# Patient Record
Sex: Female | Born: 1955
Health system: Western US, Academic
[De-identification: ages and names within clinical notes are randomized; demographics above are authoritative.]

## PROBLEM LIST (undated history)

## (undated) DIAGNOSIS — Z85850 Personal history of malignant neoplasm of thyroid: Secondary | ICD-10-CM

## (undated) DIAGNOSIS — R112 Nausea with vomiting, unspecified: Secondary | ICD-10-CM

## (undated) DIAGNOSIS — E785 Hyperlipidemia, unspecified: Secondary | ICD-10-CM

## (undated) DIAGNOSIS — R739 Hyperglycemia, unspecified: Secondary | ICD-10-CM

## (undated) DIAGNOSIS — M810 Age-related osteoporosis without current pathological fracture: Secondary | ICD-10-CM

## (undated) DIAGNOSIS — E890 Postprocedural hypothyroidism: Secondary | ICD-10-CM

## (undated) DIAGNOSIS — F32A Depression, unspecified: Secondary | ICD-10-CM

## (undated) DIAGNOSIS — T7840XA Allergy, unspecified, initial encounter: Secondary | ICD-10-CM

## (undated) DIAGNOSIS — K209 Esophagitis, unspecified without bleeding: Secondary | ICD-10-CM

## (undated) DIAGNOSIS — K648 Other hemorrhoids: Secondary | ICD-10-CM

## (undated) DIAGNOSIS — M26609 Unspecified temporomandibular joint disorder, unspecified side: Secondary | ICD-10-CM

## (undated) DIAGNOSIS — M47816 Spondylosis without myelopathy or radiculopathy, lumbar region: Secondary | ICD-10-CM

## (undated) DIAGNOSIS — Z8742 Personal history of other diseases of the female genital tract: Secondary | ICD-10-CM

## (undated) DIAGNOSIS — K2270 Barrett's esophagus without dysplasia: Secondary | ICD-10-CM

## (undated) DIAGNOSIS — F329 Major depressive disorder, single episode, unspecified: Secondary | ICD-10-CM

## (undated) DIAGNOSIS — K219 Gastro-esophageal reflux disease without esophagitis: Secondary | ICD-10-CM

## (undated) DIAGNOSIS — E663 Overweight: Principal | ICD-10-CM

## (undated) DIAGNOSIS — F411 Generalized anxiety disorder: Secondary | ICD-10-CM

## (undated) DIAGNOSIS — Z8719 Personal history of other diseases of the digestive system: Secondary | ICD-10-CM

## (undated) DIAGNOSIS — C801 Malignant (primary) neoplasm, unspecified: Secondary | ICD-10-CM

## (undated) DIAGNOSIS — Z9889 Other specified postprocedural states: Secondary | ICD-10-CM

## (undated) DIAGNOSIS — J069 Acute upper respiratory infection, unspecified: Secondary | ICD-10-CM

## (undated) DIAGNOSIS — J309 Allergic rhinitis, unspecified: Secondary | ICD-10-CM

## (undated) DIAGNOSIS — D131 Benign neoplasm of stomach: Secondary | ICD-10-CM

## (undated) DIAGNOSIS — R002 Palpitations: Secondary | ICD-10-CM

## (undated) DIAGNOSIS — K649 Unspecified hemorrhoids: Secondary | ICD-10-CM

## (undated) HISTORY — DX: Hyperlipidemia, unspecified: E78.5

## (undated) HISTORY — DX: Major depressive disorder, single episode, unspecified: F32.9

## (undated) HISTORY — DX: Age-related osteoporosis without current pathological fracture: M81.0

## (undated) HISTORY — DX: Acute upper respiratory infection, unspecified: J06.9

## (undated) HISTORY — DX: Generalized anxiety disorder: F41.1

## (undated) HISTORY — DX: Other hemorrhoids: K64.8

## (undated) HISTORY — DX: Benign neoplasm of stomach: D13.1

## (undated) HISTORY — DX: Gastro-esophageal reflux disease without esophagitis: K21.9

## (undated) HISTORY — PX: HEMORRHOID SURGERY: SHX153

## (undated) HISTORY — DX: Palpitations: R00.2

## (undated) HISTORY — DX: Overweight: E66.3

## (undated) HISTORY — DX: Malignant (primary) neoplasm, unspecified: C80.1

## (undated) HISTORY — PX: UPPER GASTROINTESTINAL ENDOSCOPY: SHX188

## (undated) HISTORY — DX: Hyperglycemia, unspecified: R73.9

## (undated) HISTORY — DX: Depression, unspecified: F32.A

## (undated) HISTORY — DX: Allergy, unspecified, initial encounter: T78.40XA

## (undated) HISTORY — DX: Barrett's esophagus without dysplasia: K22.70

---

## 1994-04-27 ENCOUNTER — Encounter: Payer: Self-pay | Admitting: Gastroenterology

## 1994-09-28 HISTORY — PX: OTHER SURGICAL HISTORY: SHX169

## 1998-03-01 ENCOUNTER — Encounter: Payer: Self-pay | Admitting: Gastroenterology

## 1998-06-09 ENCOUNTER — Emergency Department (HOSPITAL_COMMUNITY): Admission: EM | Admit: 1998-06-09 | Discharge: 1998-06-09 | Payer: Self-pay | Admitting: Emergency Medicine

## 1998-08-27 ENCOUNTER — Encounter: Payer: Self-pay | Admitting: Surgery

## 1998-08-27 ENCOUNTER — Ambulatory Visit (HOSPITAL_COMMUNITY): Admission: RE | Admit: 1998-08-27 | Discharge: 1998-08-27 | Payer: Self-pay | Admitting: Surgery

## 2000-02-16 ENCOUNTER — Encounter: Payer: Self-pay | Admitting: Endocrinology

## 2000-02-16 ENCOUNTER — Encounter: Admission: RE | Admit: 2000-02-16 | Discharge: 2000-02-16 | Payer: Self-pay | Admitting: Endocrinology

## 2000-02-20 ENCOUNTER — Ambulatory Visit (HOSPITAL_COMMUNITY): Admission: RE | Admit: 2000-02-20 | Discharge: 2000-02-20 | Payer: Self-pay | Admitting: Obstetrics and Gynecology

## 2000-02-20 ENCOUNTER — Encounter: Payer: Self-pay | Admitting: Obstetrics and Gynecology

## 2000-03-09 ENCOUNTER — Ambulatory Visit (HOSPITAL_COMMUNITY): Admission: RE | Admit: 2000-03-09 | Discharge: 2000-03-09 | Payer: Self-pay | Admitting: Gastroenterology

## 2000-04-28 ENCOUNTER — Encounter: Payer: Self-pay | Admitting: Gastroenterology

## 2000-04-28 ENCOUNTER — Ambulatory Visit (HOSPITAL_COMMUNITY): Admission: RE | Admit: 2000-04-28 | Discharge: 2000-04-28 | Payer: Self-pay | Admitting: Gastroenterology

## 2000-05-04 ENCOUNTER — Ambulatory Visit (HOSPITAL_COMMUNITY): Admission: RE | Admit: 2000-05-04 | Discharge: 2000-05-04 | Payer: Self-pay | Admitting: Gastroenterology

## 2000-05-04 ENCOUNTER — Encounter: Payer: Self-pay | Admitting: Gastroenterology

## 2000-05-18 ENCOUNTER — Encounter: Payer: Self-pay | Admitting: Endocrinology

## 2000-05-18 ENCOUNTER — Encounter: Admission: RE | Admit: 2000-05-18 | Discharge: 2000-05-18 | Payer: Self-pay | Admitting: Endocrinology

## 2001-06-28 ENCOUNTER — Encounter: Admission: RE | Admit: 2001-06-28 | Discharge: 2001-06-28 | Payer: Self-pay | Admitting: Obstetrics and Gynecology

## 2001-06-28 ENCOUNTER — Encounter: Payer: Self-pay | Admitting: Obstetrics and Gynecology

## 2001-07-01 ENCOUNTER — Encounter: Admission: RE | Admit: 2001-07-01 | Discharge: 2001-07-01 | Payer: Self-pay | Admitting: Obstetrics and Gynecology

## 2001-07-01 ENCOUNTER — Encounter: Payer: Self-pay | Admitting: Obstetrics and Gynecology

## 2002-07-21 ENCOUNTER — Ambulatory Visit (HOSPITAL_COMMUNITY): Admission: RE | Admit: 2002-07-21 | Discharge: 2002-07-21 | Payer: Self-pay | Admitting: Obstetrics and Gynecology

## 2002-07-21 ENCOUNTER — Encounter (INDEPENDENT_AMBULATORY_CARE_PROVIDER_SITE_OTHER): Payer: Self-pay

## 2002-07-21 HISTORY — PX: LAPAROSCOPIC TUBAL LIGATION: SUR803

## 2002-11-06 ENCOUNTER — Encounter: Admission: RE | Admit: 2002-11-06 | Discharge: 2002-11-06 | Payer: Self-pay | Admitting: Obstetrics and Gynecology

## 2002-11-06 ENCOUNTER — Encounter: Payer: Self-pay | Admitting: Obstetrics and Gynecology

## 2002-12-26 ENCOUNTER — Other Ambulatory Visit: Admission: RE | Admit: 2002-12-26 | Discharge: 2002-12-26 | Payer: Self-pay | Admitting: Obstetrics and Gynecology

## 2003-11-22 ENCOUNTER — Encounter: Admission: RE | Admit: 2003-11-22 | Discharge: 2003-11-22 | Payer: Self-pay | Admitting: Obstetrics and Gynecology

## 2004-02-04 ENCOUNTER — Other Ambulatory Visit: Admission: RE | Admit: 2004-02-04 | Discharge: 2004-02-04 | Payer: Self-pay | Admitting: Obstetrics and Gynecology

## 2004-12-17 ENCOUNTER — Encounter: Admission: RE | Admit: 2004-12-17 | Discharge: 2004-12-17 | Payer: Self-pay | Admitting: Obstetrics and Gynecology

## 2004-12-19 ENCOUNTER — Ambulatory Visit: Payer: Self-pay | Admitting: Gastroenterology

## 2005-01-07 ENCOUNTER — Observation Stay (HOSPITAL_COMMUNITY): Admission: RE | Admit: 2005-01-07 | Discharge: 2005-01-08 | Payer: Self-pay | Admitting: Urology

## 2005-01-07 HISTORY — PX: TRANSOBTURATOR SLING: SHX2571

## 2005-02-18 ENCOUNTER — Other Ambulatory Visit: Admission: RE | Admit: 2005-02-18 | Discharge: 2005-02-18 | Payer: Self-pay | Admitting: Obstetrics and Gynecology

## 2005-05-14 ENCOUNTER — Ambulatory Visit (HOSPITAL_COMMUNITY): Admission: RE | Admit: 2005-05-14 | Discharge: 2005-05-14 | Payer: Self-pay | Admitting: Endocrinology

## 2005-09-25 ENCOUNTER — Ambulatory Visit (HOSPITAL_COMMUNITY): Admission: RE | Admit: 2005-09-25 | Discharge: 2005-09-25 | Payer: Self-pay | Admitting: Neurology

## 2005-11-09 ENCOUNTER — Ambulatory Visit: Payer: Self-pay | Admitting: Gastroenterology

## 2006-02-09 ENCOUNTER — Encounter: Admission: RE | Admit: 2006-02-09 | Discharge: 2006-02-09 | Payer: Self-pay | Admitting: Obstetrics and Gynecology

## 2007-02-28 ENCOUNTER — Encounter: Admission: RE | Admit: 2007-02-28 | Discharge: 2007-02-28 | Payer: Self-pay | Admitting: Obstetrics and Gynecology

## 2007-03-03 ENCOUNTER — Encounter: Admission: RE | Admit: 2007-03-03 | Discharge: 2007-03-03 | Payer: Self-pay | Admitting: Obstetrics and Gynecology

## 2007-07-01 ENCOUNTER — Encounter: Admission: RE | Admit: 2007-07-01 | Discharge: 2007-07-01 | Payer: Self-pay | Admitting: Endocrinology

## 2007-07-21 ENCOUNTER — Ambulatory Visit: Payer: Self-pay | Admitting: Gastroenterology

## 2007-08-08 ENCOUNTER — Ambulatory Visit: Payer: Self-pay | Admitting: Gastroenterology

## 2007-08-08 ENCOUNTER — Encounter: Payer: Self-pay | Admitting: Gastroenterology

## 2007-08-08 HISTORY — PX: COLONOSCOPY: SHX174

## 2007-10-03 ENCOUNTER — Ambulatory Visit: Payer: Self-pay | Admitting: Gastroenterology

## 2007-11-24 DIAGNOSIS — E782 Mixed hyperlipidemia: Secondary | ICD-10-CM | POA: Insufficient documentation

## 2007-11-24 DIAGNOSIS — K219 Gastro-esophageal reflux disease without esophagitis: Secondary | ICD-10-CM | POA: Insufficient documentation

## 2007-11-24 DIAGNOSIS — K2270 Barrett's esophagus without dysplasia: Secondary | ICD-10-CM | POA: Insufficient documentation

## 2007-11-24 DIAGNOSIS — K649 Unspecified hemorrhoids: Secondary | ICD-10-CM | POA: Insufficient documentation

## 2007-11-24 DIAGNOSIS — K449 Diaphragmatic hernia without obstruction or gangrene: Secondary | ICD-10-CM | POA: Insufficient documentation

## 2007-11-24 DIAGNOSIS — E039 Hypothyroidism, unspecified: Secondary | ICD-10-CM | POA: Insufficient documentation

## 2008-01-30 ENCOUNTER — Telehealth: Payer: Self-pay | Admitting: Gastroenterology

## 2008-01-30 ENCOUNTER — Encounter (INDEPENDENT_AMBULATORY_CARE_PROVIDER_SITE_OTHER): Payer: Self-pay | Admitting: *Deleted

## 2008-01-30 ENCOUNTER — Ambulatory Visit (HOSPITAL_COMMUNITY): Admission: RE | Admit: 2008-01-30 | Discharge: 2008-01-31 | Payer: Self-pay | Admitting: *Deleted

## 2008-01-30 HISTORY — PX: APPENDECTOMY: SHX54

## 2008-02-29 ENCOUNTER — Encounter: Admission: RE | Admit: 2008-02-29 | Discharge: 2008-02-29 | Payer: Self-pay | Admitting: Obstetrics and Gynecology

## 2008-03-14 ENCOUNTER — Telehealth: Payer: Self-pay | Admitting: Gastroenterology

## 2008-03-15 ENCOUNTER — Encounter: Payer: Self-pay | Admitting: Gastroenterology

## 2008-04-02 ENCOUNTER — Telehealth: Payer: Self-pay | Admitting: Gastroenterology

## 2008-04-17 DIAGNOSIS — Z860100 Personal history of colon polyps, unspecified: Secondary | ICD-10-CM | POA: Insufficient documentation

## 2008-04-18 ENCOUNTER — Ambulatory Visit: Payer: Self-pay | Admitting: Gastroenterology

## 2008-11-22 ENCOUNTER — Encounter: Payer: Self-pay | Admitting: Family Medicine

## 2008-12-20 ENCOUNTER — Ambulatory Visit: Payer: Self-pay | Admitting: Family Medicine

## 2008-12-20 DIAGNOSIS — J309 Allergic rhinitis, unspecified: Secondary | ICD-10-CM | POA: Insufficient documentation

## 2009-03-27 ENCOUNTER — Encounter: Admission: RE | Admit: 2009-03-27 | Discharge: 2009-03-27 | Payer: Self-pay | Admitting: Obstetrics and Gynecology

## 2009-05-22 ENCOUNTER — Ambulatory Visit: Payer: Self-pay | Admitting: Gastroenterology

## 2009-08-14 ENCOUNTER — Ambulatory Visit: Payer: Self-pay | Admitting: Family Medicine

## 2009-08-14 DIAGNOSIS — E663 Overweight: Secondary | ICD-10-CM

## 2009-08-14 DIAGNOSIS — J00 Acute nasopharyngitis [common cold]: Secondary | ICD-10-CM | POA: Insufficient documentation

## 2009-08-14 DIAGNOSIS — H6690 Otitis media, unspecified, unspecified ear: Secondary | ICD-10-CM | POA: Insufficient documentation

## 2009-08-14 HISTORY — DX: Overweight: E66.3

## 2009-09-30 ENCOUNTER — Telehealth: Payer: Self-pay | Admitting: Gastroenterology

## 2010-04-09 ENCOUNTER — Encounter: Admission: RE | Admit: 2010-04-09 | Discharge: 2010-04-09 | Payer: Self-pay | Admitting: Obstetrics and Gynecology

## 2010-06-10 ENCOUNTER — Encounter (INDEPENDENT_AMBULATORY_CARE_PROVIDER_SITE_OTHER): Payer: Self-pay | Admitting: *Deleted

## 2010-07-14 ENCOUNTER — Encounter (INDEPENDENT_AMBULATORY_CARE_PROVIDER_SITE_OTHER): Payer: Self-pay | Admitting: *Deleted

## 2010-07-30 ENCOUNTER — Ambulatory Visit: Payer: Self-pay | Admitting: Family Medicine

## 2010-07-30 DIAGNOSIS — H8110 Benign paroxysmal vertigo, unspecified ear: Secondary | ICD-10-CM | POA: Insufficient documentation

## 2010-09-30 ENCOUNTER — Telehealth (INDEPENDENT_AMBULATORY_CARE_PROVIDER_SITE_OTHER): Payer: Self-pay | Admitting: *Deleted

## 2010-10-10 ENCOUNTER — Telehealth: Payer: Self-pay | Admitting: Family Medicine

## 2010-10-14 ENCOUNTER — Ambulatory Visit
Admission: RE | Admit: 2010-10-14 | Discharge: 2010-10-14 | Payer: Self-pay | Source: Home / Self Care | Attending: Family Medicine | Admitting: Family Medicine

## 2010-10-14 ENCOUNTER — Encounter: Payer: Self-pay | Admitting: Family Medicine

## 2010-10-14 DIAGNOSIS — F430 Acute stress reaction: Secondary | ICD-10-CM | POA: Insufficient documentation

## 2010-10-14 DIAGNOSIS — R002 Palpitations: Secondary | ICD-10-CM | POA: Insufficient documentation

## 2010-10-14 DIAGNOSIS — R55 Syncope and collapse: Secondary | ICD-10-CM | POA: Insufficient documentation

## 2010-10-19 ENCOUNTER — Encounter: Payer: Self-pay | Admitting: Obstetrics and Gynecology

## 2010-10-30 NOTE — Procedures (Signed)
Summary: gastroenterology colon   gastroenterology colon   Imported By: Donneta Romberg 11/25/2007 10:46:37  _____________________________________________________________________  External Attachment:    Type:   Image     Comment:   External Document

## 2010-10-30 NOTE — Assessment & Plan Note (Signed)
Summary: f.u..em   History of Present Illness Visit Type: Follow-up Visit Primary GI MD: Elie Goody MD [facility] Primary Provider: Adrian Prince, MD Chief Complaint: Recall colon History of Present Illness:   [name] returns for followup of GERD and Barrett's esophagus. She relates break through symptoms occurring about twice a week on Prilosec 20 mg daily. She was previously recommended to remain on omeprazole 40 mg daily.   GI Review of Systems    Reports acid reflux.      Denies abdominal pain, belching, bloating, chest pain, dysphagia with liquids, dysphagia with solids, heartburn, loss of appetite, nausea, vomiting, vomiting blood, weight loss, and  weight gain.        Denies anal fissure, black tarry stools, change in bowel habit, constipation, diarrhea, diverticulosis, fecal incontinence, heme positive stool, hemorrhoids, irritable bowel syndrome, jaundice, light color stool, liver problems, rectal bleeding, and  rectal pain.   Current Medications (verified): 1)  Prilosec Otc 20 Mg Tbec (Omeprazole Magnesium) .... Once Daily 2)  Synthroid 150 Mcg  Tabs (Levothyroxine Sodium) .... One Tablet By Mouth Once Daily 3)  Caltrate 600+d 600-400 Mg-Unit  Tabs (Calcium Carbonate-Vitamin D) 4)  Vitamin D 86578 Unit  Caps (Ergocalciferol) .... One Tablet By Mouth Once Daily 5)  Motrin Ib 200 Mg Tabs (Ibuprofen) .... As Needed 6)  Excedrin Extra Strength [phone] Mg Tabs (Aspirin-Acetaminophen-Caffeine) .... As Needed For Migraine  Allergies (verified): 1)  ! * Steroid Injections  Past History:  Past Medical History: HYPOTHYROIDISM (ICD-244.9) HIATAL HERNIA (ICD-553.3) HEMORRHOIDS (ICD-455.6) BARRETTS ESOPHAGUS (ICD-530.85) without dysplasia GERD (ICD-530.81) HYPERLIPIDEMIA (ICD-272.4) Multinodular goiter  Fundic gland polyps Hyperplastic gastric polyps  Past Surgical History: Reviewed history from 04/18/2008 and no changes required. hemorrhoidectomy tubal  ligation Appendectomy 5/09 Bladder sling 2006 R Oopherectomy 1996  Family History: Reviewed history from 04/18/2008 and no changes required. Family History of Prostate Cancer: Father Family History of Diabetes: Father  Social History: Reviewed history from 04/18/2008 and no changes required. 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  Alcohol Use - yes Daily Caffeine Use soft drinks Illicit Drug Use - no  Review of Systems       The patient complains of headaches-new.         The pertinent positives and negatives are noted as above and in the HPI. All other ROS were reviewed and were negative.   Vital Signs:  Patient profile:   55 year old female Height:      67 inches Weight:      170.25 pounds BMI:     26.76 Pulse rate:   96 / minute Pulse rhythm:   regular BP sitting:   100 / 68  (left arm) Cuff size:   regular  Vitals Entered By: June McMurray CMA Duncan Dull) (May 22, 2009 3:43 PM)  Physical Exam  General:  Well developed, well nourished, no acute distress. Head:  Normocephalic and atraumatic. Eyes:  PERRLA, no icterus. Mouth:  No deformity or lesions, dentition normal. Lungs:  Clear throughout to auscultation. Heart:  Regular rate and rhythm; no murmurs, rubs,  or bruits. Abdomen:  Soft, nontender and nondistended. No masses, hepatosplenomegaly or hernias noted. Normal bowel sounds. Psych:  Alert and cooperative. Normal mood and affect.   Impression & Recommendations:  Problem # 1:  BARRETTS ESOPHAGUS (ICD-530.85) Inadequate reflux symptom control. Resume omeprazole 40 mg q.a.m. for long-term usage. Intensify all antireflux measures and begin to use a 4 inch bed blocks. Barrett's surveillance recommended in November 2011.  Problem #  2:  SCREENING COLORECTAL-CANCER (ICD-V76.51) Screening colonoscopy recommended in November 2018.  Patient Instructions: 1)  Anti-reflux measures handout given. 2)  Please schedule a follow-up appointment in 1 year. 3)  Copy  sent to : Adrian Prince, MD 4)  The medication list was reviewed and reconciled.  All changed / newly prescribed medications were explained.  A complete medication list was provided to the patient / caregiver. 5)  Avoid foods high in acid content ( tomatoes, citrus juices, spicy foods) . Avoid eating within 3 to 4 hours of lying down or before exercising. Do not over eat; try smaller more frequent meals. Elevate head of bed four inches when sleeping.

## 2010-10-30 NOTE — Progress Notes (Signed)
Summary: Pt wants to talk about 07/30/10 OV  Phone Note Call from Patient Call back at [facility] Phone [phone] Call back at Work Phone [phone]   Caller: Patient Reason for Call: Talk to Doctor Summary of Call: Pt wants to talk to Dr Abner Greenspan about 07/30/10 OV for a possible ear infection Initial call taken by: Lannette Donath,  September 30, 2010 3:56 PM  Follow-up for Phone Call        Please call and check with patient what she needs and refer her back to Dr Scotty Court if she needs another visit. Follow-up by: Danise Edge MD,  October 01, 2010 12:07 PM  Additional Follow-up for Phone Call Additional follow up Details #1::        Patient states she was charged an over abudance for her office visit. Pt would like MD to call her back. Additional Follow-up by: Josph Macho RMA,  October 01, 2010 3:24 PM    Additional Follow-up for Phone Call Additional follow up Details #2::    notify patient I asked our billing dept to look at the note and they have agreed that I can legitimately downgrade the visit to a level 3 so I have asked them to make that happen. She should be notified as that occurs Follow-up by: Danise Edge MD,  October 07, 2010 2:40 PM  Additional Follow-up for Phone Call Additional follow up Details #3:: Details for Additional Follow-up Action Taken: I left a message asking pt to return my call. Additional Follow-up by: Josph Macho RMA,  October 07, 2010 2:58 PM  Patient informed/ Josph Macho RMA  October 08, 2010 11:47 AM

## 2010-10-30 NOTE — Procedures (Signed)
Summary: EGD  EGD   Imported By: Francee Piccolo CMA (AAMA) 05/22/2009 13:41:55  _____________________________________________________________________  External Attachment:    Type:   Image     Comment:   External Document

## 2010-10-30 NOTE — Procedures (Signed)
Summary: EGD  EGD   Imported By: Francee Piccolo CMA (AAMA) 05/22/2009 13:39:56  _____________________________________________________________________  External Attachment:    Type:   Image     Comment:   External Document

## 2010-10-30 NOTE — Assessment & Plan Note (Signed)
Summary: EAR PAIN/ACHE // RS   Vital Signs:  Patient profile:   55 year old female Weight:      174.6 pounds Temp:     98.6 degrees F BP sitting:   124 / 80  (left arm)  Vitals Entered By: Pura Spice, RN (August 14, 2009 2:04 PM) CC: c/o left ear pressure    History of Present Illness: this 55 year old white married female relates that she's had pain in the left ear with additional popping and wearing and slight dizziness at times. This is increased in severity over the last 3 days. Has had some nasal congestion and postnasal drainage, no sore throat no cough Blood pressure normal, GERD under control Complains of approximately 9 pound weight gain over the past year he relates he does not feel that she has that high caloric intake. Had normal thyroid studies recently, is on Synthroid 150 micrograms per day  Allergies: 1)  ! 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  Alcohol Use - yes Daily Caffeine Use soft drinks Illicit Drug Use - no  Review of Systems  The patient denies anorexia, fever, weight loss, weight gain, vision loss, decreased hearing, hoarseness, chest pain, syncope, dyspnea on exertion, peripheral edema, prolonged cough, headaches, hemoptysis, abdominal pain, melena, hematochezia, severe indigestion/heartburn, hematuria, incontinence, genital sores, muscle weakness, suspicious skin lesions, transient blindness, difficulty walking, depression,  unusual weight change, abnormal bleeding, enlarged lymph nodes, angioedema, breast masses, and testicular masses.    Physical Exam  General:  Well-developed,well-nourished,in no acute distress; alert,appropriate and cooperative throughout examinationoverweight-appearing.   Head:  Normocephalic and atraumatic without obvious abnormalities. No apparent alopecia or balding. Eyes:  No corneal or conjunctival inflammation noted. EOMI. Perrla. Funduscopic exam benign, without hemorrhages, exudates or papilledema. Vision grossly normal. Ears:  left tympanic membrane erythematous and dull, canal clear Nose:  nasal congestion with postnasal drainage, discolored Mouth:  Oral mucosa and oropharynx without lesions or exudates.  Teeth in good repair. Lungs:  Normal respiratory effort, chest expands symmetrically. Lungs are clear to auscultation, no crackles or wheezes. Heart:  Normal rate and regular rhythm. S1 and S2 normal without gallop, murmur, click, rub or other extra sounds.   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 As needed  Problem # 2:  OTITIS MEDIA, ACUTE (ICD-382.9) Assessment: New  Her updated medication list for this problem includes:    Motrin Ib 200 Mg Tabs (Ibuprofen) .Marland Kitchen... As needed    Amoxicillin 500 Mg Caps (Amoxicillin) .Marland Kitchen... 1 cap tid  Problem # 3:  HYPOTHYROIDISM (ICD-244.9) Assessment: Improved  Her updated medication list for this problem includes:    Synthroid 150 Mcg Tabs (Levothyroxine sodium) ..... One tablet by mouth once daily  Problem # 4:  GERD (ICD-530.81) Assessment: Improved  Problem # 5:  EXOGENOUS OBESITY (ICD-278.00) Assessment: New  Complete Medication List: 1)  Prilosec Otc 20 Mg Tbec (Omeprazole magnesium) .... Once daily 2)  Synthroid 150 Mcg Tabs (Levothyroxine sodium) .... One tablet by mouth  once daily 3)  Caltrate 600+d 600-400 Mg-unit Tabs (Calcium carbonate-vitamin d) 4)  Vitamin D 78295 Unit Caps (Ergocalciferol) .... One tablet by mouth once daily 5)  Motrin Ib 200 Mg Tabs (Ibuprofen) .... As needed 6)  Excedrin Extra Strength 250-250-65 Mg Tabs (Aspirin-acetaminophen-caffeine) .... As needed for migraine 7)  Amoxicillin 500 Mg Caps (Amoxicillin) .Marland Kitchen.. 1 cap tid 8)  Sudafed 12 Hour 120 Mg Xr12h-tab (Pseudoephedrine hcl) .Marland Kitchen.. 1bid  Patient Instructions: 1)  otitis media acute left ear. Treatment amoxicillin 500 mg t.i.d. for 10 days 2)  Bacterial rhinitis treated with Sudafed 3)  It is important that you exercise reguarly at least 20 minutes 5 times a week. If you develop chest pain, have severe difficulty breathing, or feel very tired, stop exercising immediately and seek medical attention.  4)  You need to lose weight. Consider a lower calorie diet and regular exercise.  Prescriptions: AMOXICILLIN 500 MG CAPS (AMOXICILLIN) 1 cap tid  #30 x 0   Entered and Authorized by:   Judithann Sheen MD   Signed by:   Judithann Sheen MD on 08/14/2009   Method used:   Telephoned to ...       [facility]* (retail)       [address]       [place], [place]  [id number]       Ph: [phone]       Fax: [phone]   RxID:   [id number]

## 2010-10-30 NOTE — Assessment & Plan Note (Signed)
Summary: f/u from triage/all   History of Present Illness Visit Type: follow up Primary GI MD: Elie Goody MD [facility] Primary Provider: Adrian Prince, MD Chief Complaint: Really bad burning in chest.  ? if PPI ineffective.  Also decreased vit D and iron levels, want to know if this could be caused by PPI History of Present Illness:   [name] has had substantial relief of her reflux symptoms on Kapidex 60 mg daily. She states she was drinking several cans of [facility] each day for weeks prior to her flare of reflux. She was not following recommended antireflux measures. She has concerns that her proton pump inhibitor may be leading to a low vitamin D level and low iron level. She states she had a recent iron test done Dr. Imagene Gurney office that was low. She had an upper endoscopy and colonoscopy in November 2008 with no sources for chronic blood loss noted. She underwent an appendectomy for acute appendicitis in May 2009.   GI Review of Systems    Reports acid reflux, belching, chest pain, heartburn, and  loss of appetite.      Denies abdominal pain, bloating, dysphagia with liquids, dysphagia with solids, nausea, vomiting, vomiting blood, weight loss, and  weight gain.        Denies anal fissure, black tarry stools, change in bowel habit, constipation, diarrhea, diverticulosis, fecal incontinence, heme positive stool, hemorrhoids, irritable bowel syndrome, jaundice, light color stool, liver problems, rectal bleeding, and  rectal pain.     Prior Medications Reviewed Using: Patient Recall  Updated Prior Medication List: KAPIDEX 60 MG  CPDR (DEXLANSOPRAZOLE) one tablet by mouth once daily SYNTHROID 150 MCG  TABS (LEVOTHYROXINE SODIUM) one tablet by mouth once daily VYTORIN 10-20 MG  TABS (EZETIMIBE-SIMVASTATIN) 1/2 tablet by mouth once daily CALTRATE 600+D 600-400 MG-UNIT  TABS (CALCIUM CARBONATE-VITAMIN D)  VITAMIN D 16109 UNIT  CAPS (ERGOCALCIFEROL) one tablet by mouth once  daily MULTIVITAMINS   CAPS (MULTIPLE VITAMIN) Take 1 tablet by mouth once a day    Past Medical History:    Reviewed history from 04/17/2008 and no changes required:       HYPOTHYROIDISM (ICD-244.9)       HIATAL HERNIA (ICD-553.3)       HEMORRHOIDS (ICD-455.6)       BARRETTS ESOPHAGUS (ICD-530.85)       GERD (ICD-530.81)       HYPERLIPIDEMIA (ICD-272.4)       Multinodular goiter        Fundic gland polyps       Hyperplastic gastric polyps         Past Surgical History:    hemorrhoidectomy    tubal ligation    Appendectomy 5/09    Bladder sling 2006    R Oopherectomy 1996   Family History:    Reviewed history and no changes required:       Family History of Prostate Cancer: Father       Family History of Diabetes: Father  Social History:    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        Alcohol Use - yes       Daily Caffeine Use soft drinks       Illicit Drug Use - no   Risk Factors:  Drug use:  no   Review of Systems  The pertinent positives and negatives are noted as above and in the HPI. All other ROS were negative.    Vital Signs:  Patient Profile:   55 Years Old Female Height:     67.5 inches Weight:      169 pounds BMI:     26.17 Pulse rate:   72 / minute Pulse rhythm:   regular BP sitting:   90 / 60  (left arm) Cuff size:   regular  Vitals Entered By: Francee Piccolo CMA (April 18, 2008 2:51 PM)                  Physical Exam  Head:     Normocephalic and atraumatic. Eyes:     PERRLA, no icterus. Ears:     Normal auditory acuity. Mouth:     No deformity or lesions, dentition normal. Lungs:     Clear throughout to auscultation. Heart:     Regular rate and rhythm; no murmurs, rubs,  or bruits. Abdomen:     Soft, nontender and nondistended. No masses, hepatosplenomegaly or hernias noted. Normal bowel sounds. Extremities:     No clubbing,  cyanosis, edema or deformities noted. Neurologic:     Alert and  oriented x4;  grossly normal neurologically. Psych:     Alert and cooperative. Normal mood and affect. anxious.      Impression & Recommendations:  Problem # 1:  GERD (ICD-530.81) I had a long discussion with her about the importance of maintaining long-term antireflux measures. Written literature was supplied. She is recommended to have no more than 2 caffeinated beverages a day and avoid all carbonated beverages. She will review the written information on antireflux measures. Continue Kapidex 60 mg q.a.m. I reassured her that the low vitamin D and low iron levels were not related to proton pump inhibitor therapy. I asked her to send a copy of her recent blood work from Dr. Sandria Manly to both me and Dr. Evlyn Kanner. Return office visit in one year.  Problem # 2:  BARRETTS ESOPHAGUS (ICD-530.85) As problem #1. Surveillance endoscopy for Barrett's recommended in November 2011.   Patient Instructions: 1)  Avoid foods high in acid content (tomatoes, citrus juices, spicy foods). Avoid eating within 3 to 4 hours of lying down or before exercising. Do not over eat; try smaller more frequent meals. Elevate head of bed four inches when sleeping. 2)  Copy Sent To: Adrian Prince, MD    Prescriptions: KAPIDEX 60 MG  CPDR (DEXLANSOPRAZOLE) one tablet by mouth once daily  #30 x 11   Entered by:   Christie Nottingham CMA   Authorized by:   Meryl Dare MD [facility]   Signed by:   Christie Nottingham CMA on 04/18/2008   Method used:   Print then Give to Patient   RxID:   [id number]  ]

## 2010-10-30 NOTE — Progress Notes (Signed)
Summary: epigastric pain  ---- Converted from flag ---- ---- 03/14/2008 10:47 AM, [facility] wrote: WALKED IN HAVING A PROBLEM AND WONDERS IF SHE CAN DISCUSS WITH NURSE. SITTING IN WAITING ROOM. ------------------------------  Phone Note Other Incoming Call back at [phone]   Summary of Call: Pt walked in today, c/o continued worse in the last month epigastric pain and esophageal burning using prilosec 40 mg q am states not helping symptoms. States she has a HX of Barrett's.  Pt instructed to maintain anti-reflux diet. Advised to avoid caffeine, mint, citrus foods/juices, tomatoes, chocolate. Instructed not to eat within 2 hours of exercise or bed, small meals are better than 3 large. Need to take PPI 30 minutes prior to 1st meal of the day.  Ok to change PPI?  Can we provide different samples and have her call back with what works or REV? Initial call taken by: Darcey Nora RN,  March 14, 2008 11:04 AM  Follow-up for Phone Call        2 week trial of Kapidex 60mg  qam (unless she has had problems with Prevacid) then ROV Follow-up by: Meryl Dare MD [name],  March 14, 2008 11:38 AM  Additional Follow-up for Phone Call Additional follow up Details #1::        notified pt via answering machine to come pick up samples of Kapidex and call back for REV Additional Follow-up by: Darcey Nora RN,  March 14, 2008 2:00 PM

## 2010-10-30 NOTE — Letter (Signed)
Summary: Records from [facility] 8119 - 2010  Records from [facility] 1478 - 2010   Imported By: Maryln Gottron 01/11/2009 13:40:33  _____________________________________________________________________  External Attachment:    Type:   Image     Comment:   External Document

## 2010-10-30 NOTE — Assessment & Plan Note (Signed)
Summary: ear pain/njr   Vital Signs:  Patient profile:   55 year old female Height:      67 inches (170.18 cm) Weight:      175.31 pounds (79.69 kg) BMI:     27.56 O2 Sat:      93 % on Room air Temp:     98.5 degrees F (36.94 degrees C) oral Pulse rate:   88 / minute BP sitting:   118 / 78  (left arm) Cuff size:   regular  Vitals Entered By: Josph Macho RMA (July 30, 2010 2:06 PM)  O2 Flow:  Room air CC: Left ear pain X1 week/ CF Is Patient Diabetic? No   History of Present Illness: she is a 55 year old Caucasian female in today with a one-week history of left ear pressure. She has a history of vertigo and has had some very minor vertiginous symptom of this week. She also has a history of otitis media is not struggling with severe pain but is experiencing pressure. She's noted some increased nasal congestion this past week with some clear rhinorrhea and now a scratchy throat has begun no obvious fevers or chills, no chest pain, palpitations, shortness of breath, cough, GI or GU complaints at this time. She has tried over-the-counter Sudafed with some relief of her symptoms. She has used meclizine in the past with good results. Had Epley's  maneuvers recommended in the past and has not proceeded with her care. Nonsmoker  Current Medications (verified): 1)  Prilosec Otc 20 Mg Tbec (Omeprazole Magnesium) .... Once Daily 2)  Synthroid 150 Mcg  Tabs (Levothyroxine Sodium) .... One Tablet By Mouth Once Daily 3)  Caltrate 600+d 600-400 Mg-Unit  Tabs (Calcium Carbonate-Vitamin D) 4)  Vitamin D 25366 Unit  Caps (Ergocalciferol) .... One Tablet By Mouth Once Daily 5)  Motrin Ib 200 Mg Tabs (Ibuprofen) .... As Needed 6)  Excedrin Extra Strength [phone] Mg Tabs (Aspirin-Acetaminophen-Caffeine) .... As Needed For Migraine 7)  Sudafed 12 Hour 120 Mg Xr12h-Tab (Pseudoephedrine Hcl) .Marland Kitchen.. 1bid  Allergies (verified): 1)  ! * Steroid Injections  Past History:  Past medical history  reviewed for relevance to current acute and chronic problems. Social history (including risk factors) reviewed for relevance to current acute and chronic problems.  Past Medical History: Reviewed history from 05/22/2009 and no changes required. HYPOTHYROIDISM (ICD-244.9) HIATAL HERNIA (ICD-553.3) HEMORRHOIDS (ICD-455.6) BARRETTS ESOPHAGUS (ICD-530.85) without dysplasia GERD (ICD-530.81) HYPERLIPIDEMIA (ICD-272.4) Multinodular goiter  Fundic gland polyps Hyperplastic gastric polyps  Social History: Reviewed history from 04/18/2008 and no changes required. 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  Alcohol Use - yes Daily Caffeine Use soft drinks Illicit Drug Use - no  Review of Systems      See HPI  Physical Exam  General:  Well-developed,well-nourished,in no acute distress; alert,appropriate and cooperative throughout examination Head:  Normocephalic and atraumatic without obvious abnormalities. No apparent alopecia or balding. Ears:  External ear exam shows no significant lesions or deformities.  Otoscopic examination reveals clear canals, tympanic membranes are intact bilaterally without bulging, inflammation or discharge. Hearing is grossly normal bilaterally. TM's  are dull slightly anad retracted Nose:  External nasal examination shows no deformity or inflammation. Nasal mucosa are pink and moist without lesions or exudates. Mouth:  Oral mucosa and oropharynx without lesions or exudates.  Teeth in good repair. Neck:  No deformities, masses, or tenderness noted. Lungs:  Normal respiratory effort, chest expands symmetrically. Lungs are clear to auscultation, no crackles or wheezes. Heart:  Normal rate  and regular rhythm. S1 and S2 normal without gallop, murmur, click, rub or other extra sounds. Abdomen:  Bowel sounds positive,abdomen soft and non-tender without masses, organomegaly or hernias noted. Extremities:  No clubbing, cyanosis, edema, or deformity noted with normal  full range of motion of all joints.   Cervical Nodes:  R anterior LN enlarged and L anterior LN enlarged.   Psych:  Cognition and judgment appear intact. Alert and cooperative with normal attention span and concentration. No apparent delusions, illusions, hallucinations   Impression & Recommendations:  Problem # 1:  BENIGN POSITIONAL VERTIGO (ICD-386.11)  Her updated medication list for this problem includes:    Meclizine Hcl 25 Mg Tabs (Meclizine hcl) .Marland Kitchen... 1 tab by mouth three times a day as needed vertigo/n/v Maintain good hydration consider vestibular rehab  Problem # 2:  ALLERGIC RHINITIS (ICD-477.9)  Her updated medication list for this problem includes:    Sudafed 12 Hour 120 Mg Xr12h-tab (Pseudoephedrine hcl) .Marland KitchenMarland KitchenMarland KitchenMarland Kitchen 1bid Claritin daily for the next week and then as needed   Problem # 3:  OTITIS MEDIA, ACUTE (ICD-382.9)  The following medications were removed from the medication list:    Amoxicillin 500 Mg Caps (Amoxicillin) .Marland Kitchen... 1 cap tid Her updated medication list for this problem includes:    Motrin Ib 200 Mg Tabs (Ibuprofen) .Marland Kitchen... As needed    Zithromax 250 Mg Tabs (Azithromycin) .Marland Kitchen... 2 tabs by mouth once and then 1 tab by mouth daily x 4 day No obvious infection today given an rx to use only if symptoms worsen fevers develop or symptoms persist  Complete Medication List: 1)  Prilosec Otc 20 Mg Tbec (Omeprazole magnesium) .... Once daily 2)  Synthroid 150 Mcg Tabs (Levothyroxine sodium) .... One tablet by mouth once daily 3)  Caltrate 600+d 600-400 Mg-unit Tabs (Calcium carbonate-vitamin d) 4)  Vitamin D [address] Unit Caps (Ergocalciferol) .... One tablet by mouth once daily 5)  Motrin Ib 200 Mg Tabs (Ibuprofen) .... As needed 6)  Excedrin Extra Strength 250-250-65 Mg Tabs (Aspirin-acetaminophen-caffeine) .... As needed for migraine 7)  Sudafed 12 Hour 120 Mg Xr12h-tab (Pseudoephedrine hcl) .Marland Kitchen.. 1bid 8)  Zithromax 250 Mg Tabs (Azithromycin) .... 2 tabs by mouth once and  then 1 tab by mouth daily x 4 day 9)  Meclizine Hcl 25 Mg Tabs (Meclizine hcl) .Marland Kitchen.. 1 tab by mouth three times a day as needed vertigo/n/v  Patient Instructions: 1)  Please schedule a follow-up appointment as needed if symptoms worsen. 2)  Maintain good hydration, 64 oz daily Prescriptions: MECLIZINE HCL 25 MG TABS (MECLIZINE HCL) 1 tab by mouth three times a day as needed vertigo/n/v  #40 x 1   Entered and Authorized by:   Danise Edge MD   Signed by:   Danise Edge MD on 07/30/2010   Method used:   Electronically to        [facility]* (retail)       [address]       [place], [place]  [address]       Ph: [phone]       Fax: [phone]   RxID:   [id number] ZITHROMAX 250 MG TABS (AZITHROMYCIN) 2 tabs by mouth once and then 1 tab by mouth daily x 4 day  #6 x 0   Entered and Authorized by:   Danise Edge MD   Signed by:   Danise Edge MD on 07/30/2010   Method used:   Electronically to        [facility]* (retail)       [address]       [place], [place]  [address]       Ph: [phone]       Fax: [phone]   RxID:   [id number]    Orders Added: 1)  Est. Patient Level IV [244[address]]

## 2010-10-30 NOTE — Letter (Signed)
Summary: Endoscopy Letter  [facility]  [address] [place], [place] [address]   Phone: [phone]  Fax: [phone]      June 10, 2010 MRN: [id number]   [facility] [address] [place], [place]  [address]   Dear Ms. Giraldo,   According to your medical record, it is time for you to schedule an Endoscopy. Endoscopic screening is recommended for patients with certain upper digestive tract conditions because of associated increased risk for cancers of the upper digestive system.  This letter has been generated based on the recommendations made at the time of your prior procedure. If you feel that in your particular situation this may no longer apply, please contact our office.  Please call our office at [phone]) to schedule this appointment or to update your records at your earliest convenience.  Thank you for cooperating with [place] to provide you with the very best care possible.   Sincerely,   Judie Petit T. Russella Dar, M.D.  [facility] Gastroenterology Division [phone]

## 2010-10-30 NOTE — Letter (Signed)
Summary: Pre Visit Letter Revised  [facility]  [address] [place], [place] [address]   Phone: [phone]  Fax: [phone]        07/14/2010 MRN: [id number] [facility] [address] [place], [place]  [address]             Procedure Date: 08-26-10   Welcome to the Gastroenterology Division at [facility].    You are scheduled to see a nurse for your pre-procedure visit on 08-12-10 at 10:30a.m. on the 3rd floor at [facility], [address]  We ask that you try to arrive at our office 15 minutes prior to your appointment time to allow for check-in.  Please take a minute to review the attached form.  If you answer "Yes" to one or more of the questions on the first page, we ask that you call the person listed at your earliest opportunity.  If you answer "No" to all of the questions, please complete the rest of the form and bring it to your appointment.    Your nurse visit will consist of discussing your medical and surgical history, your immediate family medical history, and your medications.   If you are unable to list all of your medications on the form, please bring the medication bottles to your appointment and we will list them.  We will need to be aware of both prescribed and over the counter drugs.  We will need to know exact dosage information as well.    Please be prepared to read and sign documents such as consent forms, a financial agreement, and acknowledgement forms.  If necessary, and with your consent, a friend or relative is welcome to sit-in on the nurse visit with you.  Please bring your insurance card so that we may make a copy of it.  If your insurance requires a referral to see a specialist, please bring your referral form from your primary care physician.  No co-pay is required for this nurse visit.     If you cannot keep your appointment, please call [phone] to cancel or reschedule prior to your appointment date.  This  allows [place] the opportunity to schedule an appointment for another patient in need of care.    Thank you for choosing [facility] for your medical needs.  We appreciate the opportunity to care for you.  Please visit [place] at our website  to learn more about our practice.  Sincerely, The Gastroenterology Division

## 2010-10-30 NOTE — Progress Notes (Signed)
Summary: right side pain///triage  Phone Note Call from Patient Call back at Home Phone [phone]   Caller: Patient Call For: stark Reason for Call: Talk to Nurse Summary of Call: pain in the right side tender to touch and swollen, unable to eat , been in bed since yesterday afternoon ..pain started yesterday around 5:30pm ORDERED CHART  Initial call taken by: Tawni Levy,  Jan 30, 2008 9:08 AM  Follow-up for Phone Call        patient states pain began last night states periumbilical and radiates to right side, around her right hip bone, very tender to touch,  no fever, nausea yesterday, hasn't eaten anything today.  States she was very weak all day yesterday before pain started.   Follow-up by: Darcey Nora RN,  Jan 30, 2008 9:38 AM  Additional Follow-up for Phone Call Additional follow up Details #1::        Reviewed with Dr Russella Dar.  Patient to ER per Dr Russella Dar for eval Additional Follow-up by: Darcey Nora RN,  Jan 30, 2008 9:44 AM

## 2010-10-30 NOTE — Assessment & Plan Note (Signed)
Summary: ear ache ok per gina/mhf   Vital Signs:  Patient profile:   55 year old female Height:      67 inches Weight:      172 pounds BMI:     27.04 O2 Sat:      98 % Temp:     98.3 degrees F Pulse rate:   90 / minute BP sitting:   112 / 80  (left arm) Cuff size:   regular  Vitals Entered By: Pura Spice, RN (December 20, 2008 2:15 PM) CC: Ear ache.    History of Present Illness: Pt coplains earche past few days but is better, some nasal congestion discussed GI problems under tx Dr.Stark . Has improved under tx discussed previous lab reports, copy added to chart pseudoephedrene and claritin has helped nasal congestion and earache Lab studies from Dr. Evlyn Kanner and Jarold Motto multiple GI problems tx Dr. Russella Dar  Allergies (verified): 1)  ! * Steroid Injections  Review of Systems      See HPI  Physical Exam  General:  Well-developed,well-nourished,in no acute distress; alert,appropriate and cooperative throughout examination Head:  Normocephalic and atraumatic without obvious abnormalities. No apparent alopecia or balding. Eyes:  No corneal or conjunctival inflammation noted. EOMI. Perrla. Funduscopic exam benign, without hemorrhages, exudates or papilledema. Vision grossly normal. Ears:  External ear exam shows no significant lesions or deformities.  Otoscopic examination reveals clear canals, tympanic membranes are intact bilaterally without bulging, retraction, inflammation or discharge. Hearing is grossly normal bilaterally. Nose:  nasal congestion, boggy pALE MUCOSA Mouth:  Oral mucosa and oropharynx without lesions or exudates.  Teeth in good repair. Lungs:  Normal respiratory effort, chest expands symmetrically. Lungs are clear to auscultation, no crackles or wheezes. Heart:  Normal rate and regular rhythm. S1 and S2 normal without gallop, murmur, click, rub or other extra sounds. Abdomen:  Bowel sounds positive,abdomen soft and non-tender without masses, organomegaly or  hernias noted.   Impression & Recommendations:  Problem # 1:  ALLERGIC RHINITIS (ICD-477.9) Assessment New  Her updated medication list for this problem includes:    Flonase 50 Mcg/act Susp (Fluticasone propionate) .Marland Kitchen... 2 sprays each nostril each day for allergy  Orders: Prescription Created Electronically [phone])  Problem # 2:  HYPOTHYROIDISM (ICD-244.9) Assessment: Unchanged  Her updated medication list for this problem includes:    Synthroid 150 Mcg Tabs (Levothyroxine sodium) ..... One tablet by mouth once daily  Problem # 3:  BARRETTS ESOPHAGUS (ICD-530.85) Assessment: Unchanged  Problem # 4:  GERD (ICD-530.81) Assessment: Improved  Her updated medication list for this problem includes:    Kapidex 60 Mg Cpdr (Dexlansoprazole) ..... One tablet by mouth once daily  Complete Medication List: 1)  Kapidex 60 Mg Cpdr (Dexlansoprazole) .... One tablet by mouth once daily 2)  Synthroid 150 Mcg Tabs (Levothyroxine sodium) .... One tablet by mouth once daily 3)  Vytorin 10-20 Mg Tabs (Ezetimibe-simvastatin) .... 1/2 tablet by mouth once daily 4)  Caltrate 600+d 600-400 Mg-unit Tabs (Calcium carbonate-vitamin d) 5)  Vitamin D [address] Unit Caps (Ergocalciferol) .... One tablet by mouth once daily 6)  Multivitamins Caps (Multiple vitamin) .... Take 1 tablet by mouth once a day 7)  Flonase 50 Mcg/act Susp (Fluticasone propionate) .... 2 sprays each nostril each day for allergy  Patient Instructions: 1)  FOR ALLERGIC RHINITIS 2)  tx,claritin, pseudoephedrene and flonase nasal spray 3)  continue tx Dr. Russella Dar Prescriptions: Aleda Grana 50 MCG/ACT SUSP (FLUTICASONE PROPIONATE) 2 SPRAYS EACH NOSTRIL EACH DAY FOR ALLERGY  #1  x 11   Entered and Authorized by:   Judithann Sheen MD   Signed by:   Judithann Sheen MD on 12/20/2008   Method used:   Electronically to        [facility]* (retail)       [address]       [place], [place]  [id number]       Ph:  [phone]       Fax: [phone]   RxID:   [id number]

## 2010-10-30 NOTE — Progress Notes (Signed)
Summary: NEED SAMPLES  Phone Note Call from Patient Call back at Home Phone [phone] Call back at [phone]   Caller: Patient Call For: STARK Reason for Call: Talk to Nurse Summary of Call: PT WOULD LIKE SAMPLES OF KAPIDEX UNTIL HER APP ON 7-22 .Marland KitchenPT IS GOING OUT OF TOWN TOM AND WOULD LIKE SAMPLES THIS AFTERNOON IF POSSIBLE Initial call taken by: Tawni Levy,  April 02, 2008 11:06 AM  Follow-up for Phone Call        Pt walked in to get samples of Kapidex. I informed pt that we switched her to Protonix due to cost per her request. Pt states she took Protonix for 4 days and the med did not help with GERD sx. Pt wants some Kapidex samples and a new rx for Kapidex when she comes in for her f/u appt. Pt was given Kapidex samples until appt on 7-22.  Follow-up by: Christie Nottingham CMA,  April 02, 2008 4:30 PM

## 2010-10-30 NOTE — Procedures (Signed)
Summary: Colonoscopy & EGD  Colonoscopy & EGD   Imported By: Francee Piccolo CMA (AAMA) 05/22/2009 13:40:58  _____________________________________________________________________  External Attachment:    Type:   Image     Comment:   External Document

## 2010-10-30 NOTE — Progress Notes (Signed)
Summary: decline ov  Phone Note Call from Patient Call back at Home Phone [phone]   Caller: [facility] Call For: Judithann Sheen MD Summary of Call: Spouse call stating wife is having heart palps decline to see another md. Husband will call back on tue to see if doc stafford will work his wife in for ov. Initial call taken by: Heron Sabins,  October 10, 2010 10:10 AM  Follow-up for Phone Call        Per Dr Scotty Court ok to work in for 2:45 on Tuesday 2/17 Follow-up by: Alfred Levins, CMA,  October 10, 2010 4:09 PM  Additional Follow-up for Phone Call Additional follow up Details #1::        PT SPOUSE IS AWARE OF OV Additional Follow-up by: Heron Sabins,  October 13, 2010 8:54 AM

## 2010-10-30 NOTE — Assessment & Plan Note (Signed)
Summary: HEART PALPS/OK PER DOC/NJR   Vital Signs:  Patient profile:   55 year old female Weight:      175 pounds Pulse rate:   80 / minute Pulse rhythm:   regular BP sitting:   110 / 78  (left arm)  Vitals Entered By: Kyung Rudd, CMA (October 14, 2010 4:13 PM) CC: pt c/o left arm and chest pain x 4 month   History of Present Illness: This 55 year old white married female who is under considerable stress is the fact just recently found out her husband has renal carcinoma and he is in the process of going to [facility] for consultation. She is very stressed and anxious. She has noticed in the past 2 days increased number of palpitations as well as sensation of severe GERD and unexplained chest pain. The pain is not related to exertion but occurs is sitting no dyspnea no diaphoresis Patient also has problem with positional vertigo which she sees a history of a Dr. Fayrene Fearing love No other complaints should mention the patient also sees Dr. Ardyth Harps regarding some thyroid abnormality  Current Medications (verified): 1)  Prilosec Otc 20 Mg Tbec (Omeprazole Magnesium) .... Once Daily 2)  Synthroid 150 Mcg  Tabs (Levothyroxine Sodium) .... One Tablet By Mouth Once Daily 3)  Caltrate 600+d 600-400 Mg-Unit  Tabs (Calcium Carbonate-Vitamin D) 4)  Motrin Ib 200 Mg Tabs (Ibuprofen) .... As Needed 5)  Excedrin Extra Strength [phone] Mg Tabs (Aspirin-Acetaminophen-Caffeine) .... As Needed For Migraine 6)  Sudafed 12 Hour 120 Mg Xr12h-Tab (Pseudoephedrine Hcl) .Marland Kitchen.. 1bid 7)  Meclizine Hcl 25 Mg Tabs (Meclizine Hcl) .Marland Kitchen.. 1 Tab By Mouth Three Times A Day As Needed Vertigo/n/v  Allergies (verified): 1)  ! 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  Alcohol Use - yes Daily Caffeine Use soft drinks Illicit Drug Use - no  Review of Systems      See HPI General:  Complains of malaise. Eyes:  Denies blurring, discharge, double vision, eye irritation, eye pain, halos, itching, light sensitivity, red eye, vision loss-1 eye, and vision loss-both eyes. ENT:  Denies decreased hearing, difficulty swallowing, ear discharge, earache, hoarseness, nasal congestion, nosebleeds, postnasal drainage, ringing in ears, sinus pressure, and sore throat. CV:  Complains of chest pain or discomfort and palpitations. Resp:  Denies chest discomfort, chest pain with inspiration, cough, coughing up blood, excessive snoring, hypersomnolence, morning headaches, pleuritic, shortness of breath, sputum productive, and wheezing. GI:  Complains of indigestion.  Physical Exam  General:  Well-developed,well-nourished,in no acute distress; alert,appropriate and cooperative throughout examination Head:  Normocephalic and atraumatic without obvious abnormalities. No apparent alopecia or balding. Eyes:  No corneal or conjunctival inflammation noted. EOMI. Perrla. Funduscopic exam benign, without hemorrhages, exudates or papilledema. Vision grossly normal.no nystagmus Ears:  External ear exam shows no significant lesions or deformities.  Otoscopic examination reveals clear canals, tympanic membranes are intact bilaterally without bulging, retraction,  inflammation or  discharge. Hearing is grossly normal bilaterally. Nose:  External nasal examination shows no deformity or inflammation. Nasal mucosa are pink and moist without lesions or exudates. Mouth:  Oral mucosa and oropharynx without lesions or exudates.  Teeth in good repair. Neck:  No deformities, masses, or tenderness noted. Chest Wall:  No deformities, masses, or tenderness noted. Breasts:  not examined Lungs:  Normal respiratory effort, chest expands symmetrically. Lungs are clear to auscultation, no crackles or wheezes. Heart:  Normal rate and regular rhythm. S1 and S2 normal without gallop, murmur, click, rub or other extra sounds. Abdomen:  Bowel sounds positive,abdomen soft and non-tender without masses, organomegaly or hernias noted. Extremities:  No clubbing, cyanosis, edema, or deformity noted with normal full range of motion of all joints.     Complete Medication List: 1)  Prilosec Otc 20 Mg Tbec (Omeprazole magnesium) .... Once daily 2)  Synthroid 150 Mcg Tabs (Levothyroxine sodium) .... One tablet by mouth once daily 3)  Caltrate 600+d 600-400 Mg-unit Tabs (Calcium carbonate-vitamin d) 4)  Motrin Ib 200 Mg Tabs (Ibuprofen) .... As needed 5)  Excedrin Extra Strength 250-250-65 Mg Tabs (Aspirin-acetaminophen-caffeine) .... As needed for migraine 6)  Sudafed 12 Hour 120 Mg Xr12h-tab (Pseudoephedrine hcl) .Marland Kitchen.. 1bid 7)  Meclizine Hcl 25 Mg Tabs (Meclizine hcl) .Marland Kitchen.. 1 tab by mouth three times a day as needed vertigo/n/v 8)  Vitamin D3 50000 Unit Caps (Cholecalciferol) .Marland KitchenMarland KitchenMarland Kitchen 1 weekly for 12 weeks 9)  Crestor 10 Mg Tabs (Rosuvastatin calcium) .Marland Kitchen.. 1 qd  Patient Instructions: 1)  for hyperlipedemia take lipitor 20 mk until finished then start crestor 10 mg  each day 2)  to check lipids and hepatic  lab tests in 3 months 3)  take Nexium 40 mg twice a day for 5 days then take 1 each night 4)  Ibuprofen 600mg  three times daily after meals for shoulder 5)  to take vit D 50000 units weekly times  for 12 weeks 6)  we will chek out vertigo when you get some of the other problems solved Prescriptions: VITAMIN D3 50000 UNIT CAPS (CHOLECALCIFEROL) 1 weekly for 12 weeks  #12 x 2   Entered and Authorized by:   Judithann Sheen MD   Signed by:   Judithann Sheen MD on 10/14/2010   Method used:   Electronically to        [facility]* (retail)       [address]       [place], [place]  [id number]       Ph: [phone]       Fax: [phone]   RxID:   [id number]    Orders Added: 1)  Est. Patient Level IV [[address]]

## 2010-10-30 NOTE — Progress Notes (Signed)
Summary: triage  Phone Note Call from Patient Call back at Home Phone [phone]   Caller: Patient Call For: Russella Dar Reason for Call: Talk to Nurse Summary of Call: Patient states that she has a lot of abd pain, wants to know what to do. Initial call taken by: Tawni Levy,  September 30, 2009 9:36 AM  Follow-up for Phone Call        Patient  c/o abdominal cramping that started on Saturday night, the cramp starts on the left side and goes to her right side at the level of her umbilicus.  Cramping was severe on Saturday night a little less yesterday, and so far today has only had one incident.  Patient  denies fever, nausea, constipation or diarrhea.  She has had two normal BM's today.  Patient wants advice on what to do and what her problem is .  I advised her that that this is a new problem that we haven't previously treated her for and that she will need to be assessed  to determine what is wrong.  Patient  declined an appointment with Mike Gip PA for tomorrow, she was also advised she could see her primary care MD.  Patient  wants to see if the pain will go away on its own.  Patient is advised to try some gas-x or phazyme to see if this helps.  She will call back for further problems or concerns.   Follow-up by: Darcey Nora RN, CGRN,  September 30, 2009 9:59 AM  Additional Follow-up for Phone Call Additional follow up Details #1::        Needs to be seen for further evaluation. Additional Follow-up by: Meryl Dare MD [name],  September 30, 2009 10:04 AM

## 2010-10-30 NOTE — Miscellaneous (Signed)
Summary: Protonix RX  Clinical Lists Changes  Medications: Added new medication of PROTONIX 40 MG  TBEC (PANTOPRAZOLE SODIUM) 1 each day 30 minutes before meal - Signed Rx of PROTONIX 40 MG  TBEC (PANTOPRAZOLE SODIUM) 1 each day 30 minutes before meal;  #30 x 11;  Signed;  Entered by: Christie Nottingham CMA;  Authorized by: Meryl Dare MD FACG;  Method used: Electronic    Prescriptions: PROTONIX 40 MG  TBEC (PANTOPRAZOLE SODIUM) 1 each day 30 minutes before meal  #30 x 11   Entered by:   Christie Nottingham CMA   Authorized by:   Meryl Dare MD [facility]   Signed by:   Christie Nottingham CMA on 03/15/2008   Method used:   Electronically sent to ...       [facility]*       [address]       [place], [place]  [id number]       Ph: [phone]       Fax: [phone]   RxID:   [id number]

## 2010-10-30 NOTE — Procedures (Signed)
Summary: gastroenterology egd   gastroenterology egd   Imported By: Donneta Romberg 11/25/2007 10:48:12  _____________________________________________________________________  External Attachment:    Type:   Image     Comment:   External Document

## 2010-11-17 ENCOUNTER — Encounter (INDEPENDENT_AMBULATORY_CARE_PROVIDER_SITE_OTHER): Payer: Self-pay | Admitting: *Deleted

## 2010-11-18 ENCOUNTER — Telehealth: Payer: Self-pay | Admitting: Family Medicine

## 2010-11-18 NOTE — Telephone Encounter (Signed)
Pt would like samples of Nexium 40mg  if available, would also like Rx sent to [facility] as well...Marland KitchenMarland KitchenMarland Kitchen Can be reached at [phone].

## 2010-11-19 ENCOUNTER — Encounter (INDEPENDENT_AMBULATORY_CARE_PROVIDER_SITE_OTHER): Payer: Self-pay | Admitting: *Deleted

## 2010-11-19 ENCOUNTER — Telehealth: Payer: Self-pay | Admitting: Gastroenterology

## 2010-11-24 ENCOUNTER — Encounter: Payer: Self-pay | Admitting: Gastroenterology

## 2010-11-25 MED ORDER — ESOMEPRAZOLE MAGNESIUM 40 MG PO CPDR: 40.0000 mg | DELAYED_RELEASE_CAPSULE | Freq: Every day | ORAL | Status: DC

## 2010-11-25 NOTE — Letter (Signed)
Summary: Pre Visit Letter Revised  [facility]  [address] [place], [place] [address]   Phone: [phone]  Fax: [phone]        11/17/2010 MRN: [id number] [facility] [address] [place], [place]  [address]             Procedure Date:  12/01/2010 @ 10:00   Recall EGD-Dr. Russella Dar   Welcome to the Gastroenterology Division at [facility].    You are scheduled to see a nurse for your pre-procedure visit on 11/24/2010 at 10:30 on the 3rd floor at [facility], [address]  We ask that you try to arrive at our office 15 minutes prior to your appointment time to allow for check-in.  Please take a minute to review the attached form.  If you answer "Yes" to one or more of the questions on the first page, we ask that you call the person listed at your earliest opportunity.  If you answer "No" to all of the questions, please complete the rest of the form and bring it to your appointment.    Your nurse visit will consist of discussing your medical and surgical history, your immediate family medical history, and your medications.   If you are unable to list all of your medications on the form, please bring the medication bottles to your appointment and we will list them.  We will need to be aware of both prescribed and over the counter drugs.  We will need to know exact dosage information as well.    Please be prepared to read and sign documents such as consent forms, a financial agreement, and acknowledgement forms.  If necessary, and with your consent, a friend or relative is welcome to sit-in on the nurse visit with you.  Please bring your insurance card so that we may make a copy of it.  If your insurance requires a referral to see a specialist, please bring your referral form from your primary care physician.  No co-pay is required for this nurse visit.     If you cannot keep your appointment, please call [phone] to cancel or reschedule prior to  your appointment date.  This allows [place] the opportunity to schedule an appointment for another patient in need of care.    Thank you for choosing [facility] for your medical needs.  We appreciate the opportunity to care for you.  Please visit [place] at our website  to learn more about our practice.  Sincerely, The Gastroenterology Division

## 2010-11-25 NOTE — Progress Notes (Signed)
Summary: triage  Phone Note Call from Patient Call back at Work Phone [phone]   Caller: Patient Call For: Dr Russella Dar Reason for Call: Talk to Nurse Summary of Call: Patient is worried because she's having a lot of nausea thinks it can be because of her cancer. Initial call taken by: Tawni Levy,  November 19, 2010 10:06 AM  Follow-up for Phone Call        Patient does not have Cancer of any kind that she is aware of she is scared she has cancer from her Barrett's.  She is scheduled for her repeat recall EGD on 12/01/10.  She has had migraines HA also.  She has been prescribed zofran.  She has had  intermittent nausea for 1 month,  Dr Scotty Court has changed her from prilosec to  Nexium last month due to a increase in her reflux and she was also started on Lipitor in the last month.  Her nausea is worse as soon as she gets out of bed.  It is not associated with meals or any other symptoms including wt loss.  Today she stopped her nexium and went back on Prilosec and she held her Lipitor to see if this would help her symptoms.  I have asked her to keep her appointment for her screening EGD on 12/01/10.  She is also advised if Dr Russella Dar has any suggestions in the meantime we will call her back. Follow-up by: Darcey Nora RN, CGRN,  November 19, 2010 11:19 AM  Additional Follow-up for Phone Call Additional follow up Details #1::        Agree with above. Keep appt for EGD as planned.  Additional Follow-up by: Meryl Dare MD [name],  November 19, 2010 11:21 AM

## 2010-12-01 ENCOUNTER — Other Ambulatory Visit (AMBULATORY_SURGERY_CENTER): Payer: BC Managed Care – PPO | Admitting: Gastroenterology

## 2010-12-01 ENCOUNTER — Other Ambulatory Visit: Payer: Self-pay | Admitting: Gastroenterology

## 2010-12-01 ENCOUNTER — Encounter: Payer: Self-pay | Admitting: Gastroenterology

## 2010-12-01 DIAGNOSIS — K209 Esophagitis, unspecified without bleeding: Secondary | ICD-10-CM

## 2010-12-01 DIAGNOSIS — K2270 Barrett's esophagus without dysplasia: Secondary | ICD-10-CM

## 2010-12-01 DIAGNOSIS — D126 Benign neoplasm of colon, unspecified: Secondary | ICD-10-CM

## 2010-12-01 DIAGNOSIS — K449 Diaphragmatic hernia without obstruction or gangrene: Secondary | ICD-10-CM

## 2010-12-04 ENCOUNTER — Encounter: Payer: Self-pay | Admitting: Gastroenterology

## 2010-12-04 NOTE — Miscellaneous (Signed)
Summary: [facility] PV  Clinical Lists Changes  Allergies: Changed allergy or adverse reaction from * STEROID INJECTIONS to * STEROID INJECTIONS

## 2010-12-04 NOTE — Letter (Signed)
Summary: EGD Instructions  [facility]  [address]   [place], [place] [address]   Phone: [phone]  Fax: [phone]       JUNG INGERSON    12/23/1955    MRN: [id number]       Procedure Day /Date:Monday, 12-01-10     Arrival Time: 9:00 a.m.     Procedure Time: 10:00 a.m.     Location of Procedure:                    x  [facility] (4th Floor)  PREPARATION FOR ENDOSCOPY   On 12-01-10 THE DAY OF THE PROCEDURE:  1.   No solid foods, milk or milk products are allowed after midnight the night before your procedure.  2.   Do not drink anything colored red or purple.  Avoid juices with pulp.  No orange juice.  3.  You may drink clear liquids until 8:00 a.m., which is 2 hours before your procedure.                                                                                                CLEAR LIQUIDS INCLUDE: Water Jello Ice Popsicles Tea (sugar ok, no milk/cream) Powdered fruit flavored drinks Coffee (sugar ok, no milk/cream) Gatorade Juice: apple, white grape, white cranberry  Lemonade Clear bullion, consomm, broth Carbonated beverages (any kind) Strained chicken noodle soup Hard Candy   MEDICATION INSTRUCTIONS  Unless otherwise instructed, you should take regular prescription medications with a small sip of water as early as possible the morning of your procedure.               OTHER INSTRUCTIONS  You will need a responsible adult at least 54 years of age to accompany you and drive you home.   This person must remain in the waiting room during your procedure.  Wear loose fitting clothing that is easily removed.  Leave jewelry and other valuables at home.  However, you may wish to bring a book to read or an iPod/MP3 player to listen to music as you wait for your procedure to start.  Remove all body piercing jewelry and leave at home.  Total time from sign-in until discharge is approximately 2-3 hours.  You should go home  directly after your procedure and rest.  You can resume normal activities the day after your procedure.  The day of your procedure you should not:   Drive   Make legal decisions   Operate machinery   Drink alcohol   Return to work  You will receive specific instructions about eating, activities and medications before you leave.    The above instructions have been reviewed and explained to me by   Ezra Sites RN  November 24, 2010 11:03 AM     I fully understand and can verbalize these instructions _____________________________ Date _________

## 2010-12-09 NOTE — Procedures (Addendum)
Summary: Upper Endoscopy  Patient: [name] Note: All result statuses are Final unless otherwise noted.  Tests: (1) Upper Endoscopy (EGD)   EGD Upper Endoscopy       DONE     [facility]     [address]     [place], [place]  [address]          ENDOSCOPY PROCEDURE REPORT     PATIENT:  [name], [name]  MR#:  [id number]     BIRTHDATE:  [birth year], 54 yrs. old  GENDER:  female     ENDOSCOPIST:  Judie Petit T. Russella Dar, MD, [facility]          PROCEDURE DATE:  12/01/2010     PROCEDURE:  EGD w/ polyp removal, EGD with biopsy, [address]     ASA CLASS:  Class II     INDICATIONS:  Barrett's Esophagus     MEDICATIONS:  Fentanyl 75 mcg IV, Versed 7 mg IV     TOPICAL ANESTHETIC:  Exactacain Spray     DESCRIPTION OF PROCEDURE:   After the risks benefits and     alternatives of the procedure were thoroughly explained, informed     consent was obtained.  The LB GIF-H180 [id number] endoscope was     introduced through the mouth and advanced to the second portion of     the duodenum, without limitations.  The instrument was slowly     withdrawn as the mucosa was fully examined.     <<PROCEDUREIMAGES>>     Barrett's esophagus was found in the distal esophagus. It was 1 cm     in length. Multiple biopsies were obtained and sent to pathology.     There were multiple polyps identified. in the body and the fundus     of the stomach. They were 3 - 6 mm in size. Several of the larger     polyps were snared, then removed without cautery. Polyps were     retrieved and sent to pathology. Otherwise normal stomach.     Otherwise normal esophagus. The duodenal bulb was normal in     appearance, as was the postbulbar duodenum. Retroflexed views     revealed a hiatal hernia, 3 cm. The scope was then withdrawn from     the patient and the procedure completed.          COMPLICATIONS:  None          ENDOSCOPIC IMPRESSION:     1) 1 cm barrett's esophagus     2) 3 - 6 mm polyps, multiple in the stomach     3)  A hiatal hernia          RECOMMENDATIONS:     1) Anti-reflux regimen long term     2) Await pathology results     3) PPI qam: Nexium 40mg  po qam, #30, 11 refills     4) Follow up EGD for BARRETT'S surveillance in 3 years, pending     pathology review          [name] T. Russella Dar, MD, [name]          CC:  Tawny Asal, MD          n.     Rosalie DoctorVenita Lick. Jakaree Pickard at 12/01/2010 10:36 AM          [name], [phone]  Note: An exclamation mark (!) indicates a result that was not dispersed into the flowsheet. Document Creation Date: 12/01/2010 10:37  AM _______________________________________________________________________  (1) Order result status: Final Collection or observation date-time: 12/01/2010 10:29 Requested date-time:  Receipt date-time:  Reported date-time:  Referring Physician:   Ordering Physician: Claudette Head [phone]) Specimen Source:  Source: Launa Grill Order Number: [id number] Lab site:   Appended Document: Upper Endoscopy     Procedures Next Due Date:    EGD: 11/2013

## 2010-12-09 NOTE — Miscellaneous (Signed)
Summary: nexium prescription  Clinical Lists Changes  Medications: Added new medication of NEXIUM 40 MG  CPDR (ESOMEPRAZOLE MAGNESIUM) 1 capsule each day 30 minutes before meal - Signed Rx of NEXIUM 40 MG  CPDR (ESOMEPRAZOLE MAGNESIUM) 1 capsule each day 30 minutes before meal;  #30 x 11;  Signed;  Entered by: Laverna Peace RN;  Authorized by: Meryl Dare MD [name];  Method used: Electronically to [facility]*, [address] [place], [place]  [address], Ph: [phone], Fax: [phone]    Prescriptions: NEXIUM 40 MG  CPDR (ESOMEPRAZOLE MAGNESIUM) 1 capsule each day 30 minutes before meal  #30 x 11   Entered by:   Laverna Peace RN   Authorized by:   Meryl Dare MD [facility]   Signed by:   Laverna Peace RN on 12/01/2010   Method used:   Electronically to        [facility]* (retail)       [address]       [place], [place]  [id number]       Ph: [phone]       Fax: [phone]   RxID:   [id number]

## 2010-12-16 NOTE — Letter (Signed)
Summary: Patient [facility] Biopsy Results  [facility]  [address] [place], [place] [address]   Phone: [phone]  Fax: [phone]        December 04, 2010 MRN: [id number]    [facility] [address] [place], [place]  [address]    Dear Ms. Berrong,  I am pleased to inform you that the biopsies taken during your recent endoscopic examination did not show any evidence of cancer upon pathologic examination. The biopsies showed benign fundic gland polyps and reflux changes. There was no Barrett's noted.   Continue with the treatment plan as outlined on the day of your      exam.  You should have a repeat endoscopic examination in 3 years.  Please call us if you are having persistent problems or have questions about your condition that have not been fully answered at this time.  Sincerely,  Meryl Dare MD [facility]  This letter has been electronically signed by your physician.  Appended Document: Patient Notice-Endo Biopsy Results letter mailed

## 2010-12-31 ENCOUNTER — Telehealth: Payer: Self-pay | Admitting: *Deleted

## 2010-12-31 NOTE — Telephone Encounter (Signed)
Pt has been taking Crestor, and if she takes it at night, she goes to urinate q 2 hrs.  Is this a side effect?

## 2011-01-05 ENCOUNTER — Encounter: Payer: Self-pay | Admitting: Family Medicine

## 2011-01-06 ENCOUNTER — Encounter: Payer: Self-pay | Admitting: Family Medicine

## 2011-01-06 ENCOUNTER — Ambulatory Visit (INDEPENDENT_AMBULATORY_CARE_PROVIDER_SITE_OTHER): Payer: BC Managed Care – PPO | Admitting: Family Medicine

## 2011-01-06 VITALS — BP 110/70 | HR 90 | Temp 98.0°F | Wt 173.0 lb

## 2011-01-06 DIAGNOSIS — E785 Hyperlipidemia, unspecified: Secondary | ICD-10-CM

## 2011-01-06 DIAGNOSIS — F419 Anxiety disorder, unspecified: Secondary | ICD-10-CM

## 2011-01-06 DIAGNOSIS — R351 Nocturia: Secondary | ICD-10-CM

## 2011-01-06 DIAGNOSIS — K219 Gastro-esophageal reflux disease without esophagitis: Secondary | ICD-10-CM

## 2011-01-06 DIAGNOSIS — F32A Depression, unspecified: Secondary | ICD-10-CM

## 2011-01-06 DIAGNOSIS — F341 Dysthymic disorder: Secondary | ICD-10-CM

## 2011-01-06 LAB — HEPATIC FUNCTION PANEL
ALT: 23 U/L (ref 0–35)
AST: 24 U/L (ref 0–37)
Albumin: 4.3 g/dL (ref 3.5–5.2)
Alkaline Phosphatase: 105 U/L (ref 39–117)
Bilirubin, Direct: 0.1 mg/dL (ref 0.0–0.3)
Total Bilirubin: 0.5 mg/dL (ref 0.3–1.2)
Total Protein: 7.2 g/dL (ref 6.0–8.3)

## 2011-01-06 LAB — LIPID PANEL
Cholesterol: 177 mg/dL (ref 0–200)
HDL: 56.1 mg/dL (ref >39.00)
LDL Cholesterol: 106 mg/dL — ABNORMAL HIGH (ref 0–99)
Total CHOL/HDL Ratio: 3
Triglycerides: 73 mg/dL (ref 0.0–149.0)
VLDL: 14.6 mg/dL (ref 0.0–40.0)

## 2011-01-06 LAB — POCT URINALYSIS DIPSTICK
Bilirubin, UA: NEGATIVE
Blood, UA: NEGATIVE
Glucose, UA: NEGATIVE
Ketones, UA: NEGATIVE
Nitrite, UA: NEGATIVE
Protein, UA: NEGATIVE
Spec Grav, UA: 1.025
Urobilinogen, UA: 0.2
pH, UA: 7

## 2011-01-07 NOTE — Telephone Encounter (Signed)
To come in for ov

## 2011-01-08 MED ORDER — ATORVASTATIN CALCIUM 20 MG PO TABS: 20.0000 mg | ORAL_TABLET | Freq: Every day | ORAL | Status: DC

## 2011-01-08 NOTE — Telephone Encounter (Signed)
When. Not sure when to schedule.

## 2011-01-09 NOTE — Telephone Encounter (Signed)
Pt was seen on 01/06/2011

## 2011-01-15 ENCOUNTER — Telehealth: Payer: Self-pay | Admitting: Family Medicine

## 2011-01-15 DIAGNOSIS — E039 Hypothyroidism, unspecified: Secondary | ICD-10-CM

## 2011-01-15 DIAGNOSIS — E079 Disorder of thyroid, unspecified: Secondary | ICD-10-CM

## 2011-01-15 NOTE — Telephone Encounter (Signed)
Pt called and said that she has tried calling Dr Dorisann Frames as discussed with Dr Scotty Court re: Endocrinology. Dr Talmage Nap requires doctor referral for new pt appts. Pls call in referral to [phone] or the referral can be faxed to [phone].

## 2011-01-18 ENCOUNTER — Encounter: Payer: Self-pay | Admitting: Family Medicine

## 2011-01-18 NOTE — Patient Instructions (Signed)
Alcohol usage as I have lab results Call in prescription for Lipitor instead of Crestor Call if you continue to have problems or come in one form

## 2011-01-18 NOTE — Progress Notes (Signed)
  Subjective:    Patient ID: [name], female    DOB: 1956/05/17, 55 y.o.   MRN: [id number] this 55 year old white married female who lives hyperlipidemia and had been on Crestor relates that when she takes her Crestor he has nocturia approximately every 2 hour also increased urination during the day. When she lays off she does not have these symptoms appearedEyes and also has history of GERD with history of Barrett's esophagitisPatient has been under considerable stress with a recent diagnosis of renal cancer and her husband has had [place] in [place] time 30-40 minutes her stresses in her marriage her husband having cancer as well as [place] She has had marital problems for years but lives with them In today to recheck her liver studies lipid panel as well as urinalysis   Review of Systems See history of present illness    Objective:   Physical Exam the patient is a well-developed well-nourished attractive white female who appears to be in no distress Heart examination normal Lungs clear to palpation percussion and auscultation no rales no wheezing no dullness Abdomen liver spleen and kidneys are nonpalpable no masses no tenderness normal bowel sounds no RCA tenderness No edema        Assessment & Plan:

## 2011-02-10 NOTE — Op Note (Signed)
NAMESASKIA, SIMERSON               ACCOUNT NO.:  [id number]   MEDICAL RECORD NO.:  [id number]          PATIENT TYPE:  OIB   LOCATION:  1535                         FACILITY[facility]   PHYSICIAN:  Alfonse Ras, MD   DATE OF BIRTH:  31-Mar-1956   DATE OF PROCEDURE:  01/30/2008  DATE OF DISCHARGE:                               OPERATIVE REPORT   PREOPERATIVE DIAGNOSIS:  Acute appendicitis.   POSTOPERATIVE DIAGNOSIS:  Right lower quadrant pain, questionable acute  appendicitis.   PROCEDURE:  Diagnostic laparoscopy and laparoscopic appendectomy.   SURGEON:  Alfonse Ras, M.D.   ANESTHESIA:  General.   FINDINGS:  Normal right colon, normal gallbladder, some scarring in the  right lower quadrant, and minimally inflamed appendix.   PROCEDURE:  Laparoscopic appendectomy.   DESCRIPTION:  The patient was taken to the operating room, placed in a  supine position.  After adequate general anesthesia was induced using  endotracheal tube the abdomen was prepped and draped in sterile fashion.  Using a 12 mm incision in the left upper quadrant and the Optiview  direct peritoneal access was obtained under direct vision using the  Optiview.  Pneumoperitoneum was obtained.  Additional 5 mm trocar was  placed in left abdomen and 12 mm trocar was placed in the left lower  quadrant.  The colon and distal small bowel were inspected and as was a  previous scar tissue from the right oophorectomy.  The gallbladder was  inspected and not found to have any pathology.  The appendix was easily  visualized, was minimally inflamed, and elevated anteriorly.  The  mesoappendix was taken down and the base of the appendix was transected  using a Endo GIA white load stapling device.  It was placed in EndoCatch  bag and removed through the left lower quadrant port.  The pelvis was  inspected.  There was no evidence of endometriosis or other pathology.  Adequate hemostasis was ensured.  The staple line was  inspected and  appeared to be intact without any problems.  Pneumoperitoneum was  released.  The appendix was removed as mentioned above.  Trocars were  removed and injected using 0.5 Marcaine.  Skin incisions were closed  with subcuticular 4-0 Monocryl.  Dermabond dressing was placed.  The  patient tolerated the procedure well went to PACU in good condition.      Alfonse Ras, MD  Electronically Signed     KRE/MEDQ  D:  01/30/2008  T:  01/31/2008  Job:  [id number]

## 2011-02-10 NOTE — Assessment & Plan Note (Signed)
[facility]                         GASTROENTEROLOGY OFFICE NOTE   [name], [name]                      MRN:          [id number]  DATE:10/03/2007                            DOB:          20-May-1956    This is a return office visit for GERD with recent diagnosis of short  segment Barrett's esophagus without dysphagia.  In addition, she has  persistent hemorrhoidal swelling.  She has several questions regarding  her GERD, fundic gland polyps, Barrett's esophagus and hemorrhoids.  I  attempted to answer all of her questions.   MEDICATIONS  listed on the chart updated and reviewed. Marland Kitchen   MEDICATION ALLERGIES:  CELESTONE leading to flushing and a hot  sensation.   PHYSICAL EXAMINATION:  No acute distress.  Weight:  169.4 pounds.  Blood  pressure:  98/70.  Pulse:  88 and regular.  She is not re-examined.   ASSESSMENT/PLAN:  1. Gastroesophageal reflux disease with a short segment of Barrett's      esophagus.  Re-intensify anti-reflux measures and change to      Omeprazole 40 mg p.o. q.a.m.  Recall endoscopy planned for three      years.  2. History of gastric fundic gland polyps and hyperplastic polyps.      She is very concerned about the gastric polyps despite reassurance      that the polyps are benign and that they are not neoplastic. We      will reevaluate the polyps at her repeat endoscopy for surveillance      of Barrett's.  3. Internal hemorrhoids.  Renew Anusol HC Suppositories and  begin      AnaMantle 2.5% Cream.  She may use both of these medications as      needed.  It seems the Celestone led to some mild side effects      typical for glucocorticosteroids.  I do not feel she has a true      allergy.  She has tolerated Anusol HC suppositories without      difficulty in the past.  4. Return office visit in one year.     Venita Lick. Russella Dar, MD, [facility]  Electronically Signed    MTS/MedQ  DD: 10/03/2007  DT: 10/03/2007  Job #:  [id number]

## 2011-02-10 NOTE — Assessment & Plan Note (Signed)
[facility]                         GASTROENTEROLOGY OFFICE NOTE   AMISHA, POSPISIL                      MRN:          [id number]  DATE:07/21/2007                            DOB:          May 23, 1956    This is a return office visit for GERD with a history of multiple  hyperplastic gastric polyps and benign fundic gland polyps.  Her reflux  symptoms are under very good control on Prilosec 20 mg p.o. daily and  antireflux measures.  Please see my note from December 19, 2004, for  additional details.   CURRENT MEDICATIONS:  Listed on the chart, updated and reviewed.   MEDICATION ALLERGIES:  CELESTONE.   EXAMINATION:  No acute distress.  Weight 167.2 pounds, blood pressure is  98/64, pulse 72 and regular.  CHEST:  Clear to auscultation bilaterally.  CARDIAC:  Regular rate and rhythm without murmurs.  ABDOMEN:  Soft and nontender with normal active bowel sounds.  No  palpable organomegaly, masses or hernias.  RECTAL:  Deferred to time of colonoscopy.   ASSESSMENT AND PLAN:  1. Chronic gastroesophageal reflux disease with a history of multiple      hyperplastic gastric polyps and fundic gland polyps.  She is quite      concerned about malignancy and pre-cancerous lesions, and we had      planned to proceed with upper endoscopy this year.  Risks, benefits      and alternatives to upper endoscopy with possibly biopsy discussed      with the patient and she consents to proceed.  This will be      scheduled electively.  Maintain standard antireflux measures and      Prilosec 20 mg p.o. q.a.m.  2. Colorectal cancer screening.  She would like to proceed with      colonoscopy at this time.  Risks, benefits and alternatives to      colonoscopy with possible biopsy and possible polypectomy discussed      with the patient and she consents to proceed.  This will be      scheduled electively.     Venita Lick. Russella Dar, MD, [facility]  Electronically Signed    MTS/MedQ  DD: 08/01/2007  DT: 08/02/2007  Job #: [id number]

## 2011-02-10 NOTE — Consult Note (Signed)
[name], [name]               ACCOUNT NO.:  [id number]   MEDICAL RECORD NO.:  [id number]          PATIENT TYPE:  OIB   LOCATION:  1535                         FACILITY:  [facility]   PHYSICIAN:  Alfonse Ras, MD   DATE OF BIRTH:  1955-10-29   DATE OF CONSULTATION:  01/30/2008  DATE OF DISCHARGE:                                 CONSULTATION   CHIEF COMPLAINT:  Abdominal pain.   HISTORY OF PRESENT ILLNESS:  The patient is a 55 year old white female  who presented to [facility] Admissions at [facility] with  approximately an 18-hour history of vague lower abdominal discomfort  slightly worse on the right than the left.  Workup here revealed a white  blood cell count of 6.5, afebrile, otherwise normal labs.  CT scan of  the abdomen had a mildly enlarged appendix with minimal inflammation  near the appendix.  No other pathology was noted.   On further questioning with the patient, she actually feels somewhat  better now, but she has been in the emergency room for 6-8 hours.  She  has remained afebrile during her stay here.   PHYSICAL EXAMINATION:  VITAL SIGNS:  Her temperature is 97.  ABDOMEN:  Soft, somewhat tender to moderate palpation in suprapubic and  right lower quadrant regions without rebound.  Negative heel-tap sign,  negative psoas sign, negative obturator sign, and negative Rovsing sign.   IMPRESSION:  Abdominal pain, clinically stable, questionable very very  early appendicitis.   PLAN:  I discussed options including laparoscopic appendectomy,  observation, in an outpatient setting with strong encouragement if her  symptoms worsen to recontact me or my office and be seen by one of my  partners in the next 24 hours or so.  Otherwise, she will remain on  clear liquids.      Alfonse Ras, MD  Electronically Signed     KRE/MEDQ  D:  01/30/2008  T:  01/31/2008  Job:  [id number]

## 2011-02-13 NOTE — Op Note (Signed)
[name], [name]               ACCOUNT NO.:  [id number]   MEDICAL RECORD NO.:  [id number]          PATIENT TYPE:  AMB   LOCATION:  DAY                          FACILITY:  [facility]   PHYSICIAN:  Maretta Bees. Vonita Moss, M.D.DATE OF BIRTH:  07/25/1956   DATE OF PROCEDURE:  01/07/2005  DATE OF DISCHARGE:                                 OPERATIVE REPORT   PREOPERATIVE DIAGNOSES:  Cystocele and stress incontinence.   POSTOPERATIVE DIAGNOSES:  Cystocele and stress incontinence.   PROCEDURE:  Obturator sling insertion.   SURGEON:  Dr. Larey Dresser.   ASSISTANT:  Dr. Bjorn Pippin.   ANESTHESIA:  General.   INDICATIONS:  This 55 year old white female has had a long history of  classic stress incontinence which developed after the delivery of her second  child. She has a cystocele and she was seen by her gynecologist, Dr. Tilford Pillar, who felt that hysterectomy was not needed at this time. She was  brought the OR today for anterior repair by Dr. Annabell Howells, obturator sling  insertion by myself.   PROCEDURE:  The patient was brought to the operating room, placed in the  lithotomy position. The lower abdomen, eternal genitalia and vaginal canal  prepped and draped in the usual fashion.  Dr. Annabell Howells proceeded with opening  up the vaginal mucosa under the bladder and dictated an anterior repair  separately.   I then became the primary surgeon and after some further dissection to  dissect out the periurethral tissues and release some anterior wall that was  high peri-urethrally. I made stab wounds in the obturator, over the  obturator fossa at the level of the clitoris. Using the curved needle passer  after going through the stab wounds, I advanced the needle passer around the  back and marched along the back of the obturator bone and exited mid-urethra  on the left side.  The [employer] sling was then brought up on that  side and in like fashion, through the puncture wound over the right  obturator fossa, the curved needle was brought around the backside of the  obturator fossa and brought out peri-urethrally in mid-urethral level and  the other in the sling pulled up through the incision.   I then cystoscoped and there was no evidence of bladder or urethral injury,  bleeding or problems. With a hemostat between the sling and the urethra and  the Foley catheter back in place which earlier had been used to drain the  bladder before the needle sticks, the sling was in good position without  undue tension and a hemostat easily fit between the urethra and the sling.  The sling was then cut off at skin level after removing the protective  covers. At this point, the sling was in excellent position. The stab wounds  were later closed with Dermabond and then I proceeded to trim off some of  the redundant vaginal mucosa and close the vaginal incision with running 2-0  Vicryl.  Foley catheter was connected to closed drainage and a Estrace gauze vaginal  pack was placed in the vaginal area. She was  taken to the recovery room in  good condition. Sponge, needle, instrument count was correct. Estimated  blood loss was 100 mL.      LJP/MEDQ  D:  01/07/2005  T:  01/07/2005  Job:  [id number]

## 2011-02-13 NOTE — H&P (Signed)
   [name], [name]                         ACCOUNT NO.:  [id number]   MEDICAL RECORD NO.:  [id number]                   PATIENT TYPE:  AMB   LOCATION:  SDC                                  FACILITY:  [facility]   PHYSICIAN:  Dineen Kid. Rana Snare, M.D.                 DATE OF BIRTH:  [birth year]   DATE OF ADMISSION:  DATE OF DISCHARGE:                                HISTORY & PHYSICAL   HISTORY OF PRESENT ILLNESS:  The patient is a 55 year old G3, P2 at 6.5  weeks' estimated gestational age who finds herself unexpectedly pregnant.  She also desires sterilization.  She does have several medical problems  which would be worsened by pregnancy.  Also, for numerous reasons, she would  not like to carry this pregnancy.  She presents today for termination of  pregnancy as well.   PAST MEDICAL HISTORY:  1. Gastroesophageal reflux disease.  2. Hypothyroidism.  3. Stress urinary incontinence.   PAST SURGICAL HISTORY:  1. History of right salpingo-oophorectomy due to large ovarian cyst by Dr.     Elana Alm in 1996.   MEDICATIONS:  1. Prozac.  2. Zantac.  3. Synthroid.   ALLERGIES:  Sensitivity to CELESTONE but no drug allergies.   OBJECTIVE DATA:  Blood pressure is 118/78.  HEART:  Regular rate and rhythm.  LUNGS:  Clear to auscultation.  ABDOMEN:  Nondistended, nontender.  PELVIC:  Not performed due to recent examination.   IMPRESSION:  1. Intrauterine pregnancy at approximately 6.5 weeks' estimated gestational     age, which is unplanned pregnancy and she desires termination of     pregnancy and sterilization.   PLAN:  1. Multiple options were discussed with patient and her husband.  Both she     and her husband have thought this     through and desire termination of pregnancy and sterilization.  Risks and     benefits of the procedure were discussed in length including but not     limited to infection, bleeding, damage to uterus, tubes, ovaries, bowel,     bladder and ureters as well  as risk of tubal sterility quoted as 5:5000.     They do give their informed consent.                                                 Dineen Kid Rana Snare, M.D.    DCL/MEDQ  D:  07/20/2002  T:  07/20/2002  Job:  [id number]

## 2011-02-13 NOTE — Op Note (Signed)
NAMEVERMELL, MADRID                         ACCOUNT NO.:  [id number]   MEDICAL RECORD NO.:  [id number]                   PATIENT TYPE:  AMB   LOCATION:  SDC                                  FACILITY:  [facility]   PHYSICIAN:  Dineen Kid. Rana Snare, M.D.                 DATE OF BIRTH:  [birth year]   DATE OF PROCEDURE:  07/21/2002  DATE OF DISCHARGE:                                 OPERATIVE REPORT   PREOPERATIVE DIAGNOSES:  1. Intrauterine pregnancy at approximately 6-1/[redacted] weeks gestational age.  2. Desired termination of pregnancy.  3. Desired sterilization.   POSTOPERATIVE DIAGNOSES:  1. Intrauterine pregnancy at approximately 6-1/[redacted] weeks gestational age.  2. Desired termination of pregnancy.  3. Desired sterilization.   PROCEDURE:  1. Dilatation and evacuation.  2. Laparoscopic tubal ligation.   SURGEON:  Dineen Kid. Rana Snare, M.D.   ANESTHESIA:  General endotracheal.   ESTIMATED BLOOD LOSS:  Less than 10 cc.   INDICATIONS:  The patient is a 55 year old G3, P2 at 6-1/2 weeks estimated  gestational age with an unexpected pregnancy, multiple medical problems  which she feels would be compromised by a pregnancy.  Also desires  sterilization.  She presents today for termination of pregnancy and  sterilization.  Risks and benefits were discussed at length.  Informed  consent was obtained which includes, but not limited to, risk of infection,  bleeding, damage to uterus, __________ , bowel or bladder.  Also, tubal  failure quoted as 5 out of 1000 failure.  Informed consent was obtained.   FINDINGS:  Normal appendix, liver, gallbladder.  Normal left tube.  Small  serous cyst on the left ovary.  Retroverted uterus mildly enlarged.  No  right tube or ovary identified consistent with her previous surgery.   DESCRIPTION OF PROCEDURE:  After adequate analgesia, patient placed in the  dorsal lithotomy position.  She is sterilely prepped and draped.  Bladder  was sterilely drained.  Graves' speculum  was placed.  Tenaculum was placed  on the anterior lip of the cervix.  The uterus was sounded to approximately  13 cm in a retroverted position.  It was easily dilated to a number 33 Pratt  dilator.  A 9 mm suction curette was inserted and products of conception  were retrieved.  This was performed until gritty surface was felt throughout  the endometrial cavity and no more products of conception were retrieved.  The patient was given Methergine 0.2 mg IM with good uterine response.  At  this time the suction curette was removed.  A  Hulka tenaculum was placed on  the anterior lip of the cervix.  The Graves' speculum was removed.  A 1 cm  infraumbilical incision was made.  A Veress needle was inserted.  The  abdomen was insufflated to dullness of percussion and the 11 mm trocar was  easily inserted and the above findings were noted with laparoscope.  The  left fallopian tube was identified by the fimbriated end.  A mid portion of  the tube was grasped.  It was fulgurated with bipolar cautery and over  approximately 3-4 cm section of the mid portion of the tube with good  thermal burn noted across the entirety of the tube and loss of resistance on  the ohmmeter.  After reexamination showed good thermal burn across the 3-4  cm section of the tube, reexamination of the pelvis and abdomen appeared to  be normal.  The laparoscope was then removed.  Trocar was removed.  Abdomen  was desufflated.  The umbilical skin incision was closed with a 0 Vicryl  interrupted suture in the fascia and a 3-0 Vicryl Rapide subcuticular stitch  in the skin.  The incision was then injected with 0.25% Marcaine 10 cc.  The  tenaculum was removed from the anterior lip of the cervix.  It was noted to  be hemostatic.  The patient was then transferred to recovery room in stable  condition.  Estimated blood loss was less than 10 cc.  Sponge and instrument  count was normal x3.   DISPOSITION:  The patient will be  discharged home with a routine instruction  sheet for D&C and laparoscopy.  She will follow up in the office in three  weeks.  She was sent home with a prescription for doxycycline 100 mg p.o.  b.i.d. x7 days, Methergine 0.2 mg q.8h. for three days, and Darvocet as  needed number 30.                                               Dineen Kid Rana Snare, M.D.    DCL/MEDQ  D:  07/21/2002  T:  07/21/2002  Job:  [id number]

## 2011-02-13 NOTE — Op Note (Signed)
[name], [name]               ACCOUNT NO.:  [id number]   MEDICAL RECORD NO.:  [id number]          PATIENT TYPE:  AMB   LOCATION:  DAY                          FACILITY:  [facility]   PHYSICIAN:  Excell Seltzer. Annabell Howells, M.D.    DATE OF BIRTH:  12/31/1955   DATE OF PROCEDURE:  01/07/2005  DATE OF DISCHARGE:                                 OPERATIVE REPORT   PREOPERATIVE DIAGNOSIS:  Cystocele.   POSTOPERATIVE DIAGNOSIS:  Cystocele.   OPERATION PERFORMED:  Anterior repair.   SURGEON:  Excell Seltzer. Annabell Howells, M.D.   ASSISTANT:  Maretta Bees. Vonita Moss, M.D.   ANESTHESIA:  General.   COMPLICATIONS:  None.   INDICATIONS FOR PROCEDURE:  [name] is a 55 year old white female with  stress incontinence and a cystocele.  She is to have a  transobturator sling  by Dr. Vonita Moss and anterior repair by me.   FINDINGS AND PROCEDURE:  The patient was taken to the operating room after  appropriate antibiotics.  General anesthetic was induced and she was placed  in the lithotomy  position.  Her perineum and genitalia were prepped with  Betadine solution.  She was draped in sterile fashion.  A weighted vaginal  retractor was placed.  A Foley catheter was inserted.  The anterior vaginal  wall was infiltrated with 1% lidocaine with epinephrine approximately 10 cc.  A midline anterior vaginal incision was made over the cystocele.  The  vaginal mucosa was elevated off the pubovesical fascia out to the pelvic  side wall.  Once appropriate freeing of the mucosa was performed from the  cervix up to the bladder neck, plication of the bladder was performed using  2-0 Vicryl horizontal mattresses in two layers.  Once the cystocele had been  reduced, Dr. Vonita Moss entered the procedure and performed the transobturator  sling.  He then trimmed and closed the vaginal mucosa. There were no  complications during my portion of the procedure.      JJW/MEDQ  D:  01/07/2005  T:  01/07/2005  Job:  [id number]

## 2011-02-13 NOTE — Procedures (Signed)
[place]  Patient:    [name], [name]                        MRN: [id number] Proc. Date: 03/10/00 Adm. Date:  [id number] Disc. Date: [id number] Attending:  Mardella Layman                           Procedure Report  PROCEDURE:  Esophageal manometry.  ENDOSCOPIST:  Vania Rea. Jarold Motto, M.D.  ANESTHESIA:  INDICATIONS:  Esophageal manometry was completed on March 09, 2000.  Results are as follows:  1. Upper esophageal sphincter - there appeared to be normal coordination    between pharyngeal contraction and cricopharyngeal relaxation. 2. Lower esophageal sphincter - lower esophageal sphincter pressures    diminished to 13 mmHg with normal relaxation with swallowing. 3. Motility pattern - normally propagated peristaltic waves of normal    amplitude and duration throughout the length of the esophagus.  Esophageal    amplitude is approximately 55 mmHg.  ASSESSMENT:  This esophageal manometry shows incompetent lower esophageal sphincter consistent with her known acid reflux disease.  Otherwise, unremarkable manometry report. DD:  03/10/00 TD:  03/15/00 Job: [id number]

## 2011-02-13 NOTE — Procedures (Signed)
[facility]  Patient:    [name]               MRN: [id number] Proc. Date: 04/28/00 Adm. Date:  [id number] Attending:  Mardella Layman CC:         Tera Mater. Evlyn Kanner, M.D.   Procedure Report  PROCEDURE: Outpatient endoscopy.  INDICATIONS FOR PROCEDURE:  Ms. Orzechowski is a 55 year old white female whose been bothered for years with acid reflux symptoms refractory to medical treatment. She underwent esophageal manometry in June which showed an incompetent lower esophageal sphincter but no evidence of motility disorder. Her last endoscopy in 1999 showed a large hiatal hernia with erosive esophagitis. She has been referred previously to Dr. Daphine Deutscher in October of 1999 for consideration of fundoplication surgery, but refused this procedure. Because of her current progressive dysphagia, it was felt that endoscopy and dilatation was in order at this time. The patient also has many symptoms consistent with "panic attacks" with chest pain, shortness of breath, headaches, palpitations, etc. which is complicating her treatment regime. The risks and benefits of this procedure were explained in detail, and she agreed to proceed as planned. Preoperative cardiopulmonary and mental status exams were unremarkable.  ENDOSCOPY REPORT:  Throughout this procedure, the patient was on pulse oximetry and cardiac monitorization. She tolerated this procedure well receiving supplemental low flow oxygen by nasal cannula. She was anesthetized with cetacaine spray to the oropharynx, fentanyl 65 mcg IV, and Versed 6.5 mg IV. She was intubated using the Olympus adult video endoscope. The esophagus was generally unremarkable except for a circumferential Schatzkis ring in the distal esophagus above a 5-6 cm prominent hiatal hernia. The endoscope passed without difficulty into the stomach. There were no retained food particles or blood in the stomach. The mucosal pattern of  the fundus, body, antrum, and pylorus of the stomach was normal as was the duodenal bulb and duodenal sweep. retroflexed view of the fundus and cardia was unremarkable except for the above mentioned hiatal hernia. A picture obtained for documentation.  The endoscope was then placed in the antrum of the stomach, with fluoroscopic control, and a floppy-tipped guidewire was inserted through the endoscope. The endoscope was then withdrawn over the guidewire with endoscopic control. The patient was then dilated with a #17, and then a #18 mm Savary dilator again with fluoroscopic guidance. These were seen to pass across the gastroesophageal junction into the stomach. The patient tolerated the procedure well and was extubated without difficulty, returned in a stable condition to the recovery room for observation.  ASSESSMENT: 1. Prominent hiatal hernia and chronic gastroesophageal reflux disease. 2. 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 3. Panic attacks complicating her care and clouding her symptomatology.  RECOMMENDATIONS: 1. Continue strict antireflux regimen and maneuvers along with twice a day    proton pump inhibitor therapy. 2. Soft diet for 24 hours to be advanced as tolerated. 3. Trial of klonopin 0.25 mg twice a day for 5 days to be increased to 0.5 mg    twice a day as tolerated. 4. Office follow-up in 1 months time again with consideration of possible    fundoplication surgery depending on her clinical outcome. DD:  04/28/00 TD:  04/29/00 Job: [id number]

## 2011-03-06 ENCOUNTER — Other Ambulatory Visit: Payer: Self-pay | Admitting: Endocrinology

## 2011-03-06 DIAGNOSIS — E049 Nontoxic goiter, unspecified: Secondary | ICD-10-CM

## 2011-03-10 ENCOUNTER — Ambulatory Visit
Admission: RE | Admit: 2011-03-10 | Discharge: 2011-03-10 | Disposition: A | Discharge status: Home / Self Care | Payer: BC Managed Care – PPO | Source: Ambulatory Visit | Attending: Endocrinology | Admitting: Endocrinology

## 2011-03-10 DIAGNOSIS — E049 Nontoxic goiter, unspecified: Secondary | ICD-10-CM

## 2011-03-13 ENCOUNTER — Other Ambulatory Visit: Payer: Self-pay | Admitting: Endocrinology

## 2011-03-13 DIAGNOSIS — E042 Nontoxic multinodular goiter: Secondary | ICD-10-CM

## 2011-03-17 ENCOUNTER — Ambulatory Visit
Admission: RE | Admit: 2011-03-17 | Discharge: 2011-03-17 | Disposition: A | Discharge status: Home / Self Care | Payer: BC Managed Care – PPO | Source: Ambulatory Visit | Attending: Endocrinology | Admitting: Endocrinology

## 2011-03-17 ENCOUNTER — Other Ambulatory Visit (HOSPITAL_COMMUNITY)
Admission: RE | Admit: 2011-03-17 | Discharge: 2011-03-17 | Disposition: A | Discharge status: Home / Self Care | Payer: BC Managed Care – PPO | Source: Ambulatory Visit | Attending: Interventional Radiology | Admitting: Interventional Radiology

## 2011-03-17 ENCOUNTER — Other Ambulatory Visit: Payer: Self-pay | Admitting: Interventional Radiology

## 2011-03-17 DIAGNOSIS — E049 Nontoxic goiter, unspecified: Secondary | ICD-10-CM | POA: Insufficient documentation

## 2011-03-17 DIAGNOSIS — E042 Nontoxic multinodular goiter: Secondary | ICD-10-CM

## 2011-04-06 ENCOUNTER — Other Ambulatory Visit: Payer: Self-pay | Admitting: Obstetrics and Gynecology

## 2011-04-06 DIAGNOSIS — Z1231 Encounter for screening mammogram for malignant neoplasm of breast: Secondary | ICD-10-CM

## 2011-04-07 ENCOUNTER — Encounter (INDEPENDENT_AMBULATORY_CARE_PROVIDER_SITE_OTHER): Payer: Self-pay | Admitting: Surgery

## 2011-04-07 ENCOUNTER — Ambulatory Visit (INDEPENDENT_AMBULATORY_CARE_PROVIDER_SITE_OTHER): Payer: BC Managed Care – PPO | Admitting: Surgery

## 2011-04-07 VITALS — BP 112/68 | HR 91 | Temp 99.0°F | Wt 174.0 lb

## 2011-04-07 DIAGNOSIS — E042 Nontoxic multinodular goiter: Secondary | ICD-10-CM

## 2011-04-07 NOTE — Progress Notes (Deleted)
Chief Complaint  Patient presents with  . Thyroid Nodule    FNA bx with atypia    HISTORY: Patient is a 55 yo white female with long-standing hypothyroidism.  She was referred by Dr. Dorisann Frames for thyroid ultrasound in June 2012.  This showed a multinodular goiter with dominant nodule   Past Medical History  Diagnosis Date  . Hypothyroidism   . Hiatal hernia   . Hemorrhoids   . Barrett's esophagus without dysplasia   . GERD (gastroesophageal reflux disease)   . Hyperlipidemia   . Multinodular goiter   . Fundic gland polyps of stomach, benign      Current outpatient prescriptions:aspirin-acetaminophen-caffeine (EXCEDRIN MIGRAINE) 250-250-65 MG per tablet, Take 1 tablet by mouth. As needed for migraine , Disp: , Rfl: ;  atorvastatin (LIPITOR) 20 MG tablet, Take 1 tablet (20 mg total) by mouth daily., Disp: 30 tablet, Rfl: 11;  Calcium Carbonate-Vit D-Min (CALTRATE 600+D PLUS) 600-400 MG-UNIT per tablet, Take 1 tablet by mouth daily.  , Disp: , Rfl:  Cholecalciferol (VITAMIN D-3) 5000 UNITS TABS, Take by mouth.  , Disp: , Rfl: ;  Cholecalciferol (VITAMIN D3) 50000 UNITS CAPS, Take by mouth once a week.  , Disp: , Rfl: ;  esomeprazole (NEXIUM) 40 MG capsule, Take 1 capsule (40 mg total) by mouth daily., Disp: 30 capsule, Rfl: 1;  ibuprofen (ADVIL,MOTRIN) 200 MG tablet, Take 200 mg by mouth every 6 (six) hours as needed.  , Disp: , Rfl:  levothyroxine (SYNTHROID, LEVOTHROID) 150 MCG tablet, Take 150 mcg by mouth daily.  , Disp: , Rfl: ;  meclizine (ANTIVERT) 25 MG tablet, Take 25 mg by mouth 3 (three) times daily as needed.  , Disp: , Rfl: ;  omeprazole (PRILOSEC OTC) 20 MG tablet, Take 20 mg by mouth daily.  , Disp: , Rfl: ;  pseudoephedrine (SUDAFED) 120 MG 12 hr tablet, Take 120 mg by mouth every 12 (twelve) hours.  , Disp: , Rfl:    No Known Allergies   Family History  Problem Relation Age of Onset  . Diabetes Father   . Cancer Father     prostate  . Thyroid disease Mother     hyperparathyroidism  . Thyroid disease Maternal Grandmother      History  Substance Use Topics  . Smoking status: Never Smoker   . Smokeless tobacco: Never Used  . Alcohol Use: Yes     rarely     PERTINENT POSITIVES FROM REVIEW OF SYSTEMS: ***   EXAM: There were no vitals filed for this visit. ***   LABORATORY RESULTS: See E-Chart for most recent results   RADIOLOGY RESULTS: See E-Chart or I-Site for most recent results   IMPRESSION: ***   PLAN: ***

## 2011-04-07 NOTE — Progress Notes (Signed)
Chief Complaint  Patient presents with  . Thyroid Nodule    FNA bx with atypia    HISTORY: Patient is a 55 year old white female. She is referred by Dr. Talmage Nap for newly diagnosed thyroid nodule with cytologic atypia. The patient has a long-standing history of hypothyroidism. She has been on thyroid hormone supplementation for many years. She recently changed endocrinologist. An ultrasound was performed which showed a thyroid goiter with bilateral thyroid nodules. The dominant nodule was in the right thyroid lobe. It measured 3.3 cm in size. Fine needle aspiration biopsy showed follicular epithelial cells with some cytologic atypia including intranuclear grooves. Patient is now referred for consideration for total thyroidectomy for management of thyroid goiter, bilateral thyroid nodules, and to make a definitive diagnosis on the right thyroid nodule with cytologic atypia.  Patient does have a family history of thyroid disease in her maternal grandmother who underwent surgery for goiter. Also of note is that the patient's mother underwent parathyroidectomy for primary hyperparathyroidism.  No other family history of endocrinopathy. Patient has had no prior head or neck surgery. She is currently taking Synthroid 137 mcg daily.   Past Medical History  Diagnosis Date  . Hypothyroidism   . Hiatal hernia   . Hemorrhoids   . Barrett's esophagus without dysplasia   . GERD (gastroesophageal reflux disease)   . Hyperlipidemia   . Multinodular goiter   . Fundic gland polyps of stomach, benign      Current outpatient prescriptions:aspirin-acetaminophen-caffeine (EXCEDRIN MIGRAINE) 250-250-65 MG per tablet, Take 1 tablet by mouth. As needed for migraine , Disp: , Rfl: ;  atorvastatin (LIPITOR) 20 MG tablet, Take 1 tablet (20 mg total) by mouth daily., Disp: 30 tablet, Rfl: 11;  Calcium Carbonate-Vit D-Min (CALTRATE 600+D PLUS) 600-400 MG-UNIT per tablet, Take 1 tablet by mouth daily.  , Disp: , Rfl:    Cholecalciferol (VITAMIN D-3) 5000 UNITS TABS, Take by mouth.  , Disp: , Rfl: ;  Cholecalciferol (VITAMIN D3) 50000 UNITS CAPS, Take by mouth once a week.  , Disp: , Rfl: ;  esomeprazole (NEXIUM) 40 MG capsule, Take 1 capsule (40 mg total) by mouth daily., Disp: 30 capsule, Rfl: 1;  ibuprofen (ADVIL,MOTRIN) 200 MG tablet, Take 200 mg by mouth every 6 (six) hours as needed.  , Disp: , Rfl:  levothyroxine (SYNTHROID, LEVOTHROID) 150 MCG tablet, Take 150 mcg by mouth daily.  , Disp: , Rfl: ;  meclizine (ANTIVERT) 25 MG tablet, Take 25 mg by mouth 3 (three) times daily as needed.  , Disp: , Rfl: ;  omeprazole (PRILOSEC OTC) 20 MG tablet, Take 20 mg by mouth daily.  , Disp: , Rfl: ;  pseudoephedrine (SUDAFED) 120 MG 12 hr tablet, Take 120 mg by mouth every 12 (twelve) hours.  , Disp: , Rfl:    No Known Allergies   Family History  Problem Relation Age of Onset  . Diabetes Father   . Cancer Father     prostate  . Thyroid disease Mother     hyperparathyroidism  . Thyroid disease Maternal Grandmother      History  Substance Use Topics  . Smoking status: Never Smoker   . Smokeless tobacco: Never Used  . Alcohol Use: Yes     rarely     PERTINENT POSITIVES FROM REVIEW OF SYSTEMS: Patient notes that she does eat slowly. She has occasional dysphagia. She denies pain. She denies tremors. She denies palpitations.   EXAM: There were no vitals filed for this visit. HEENT shows her  to be normocephalic. Sclerae are clear. Pubocervical and reactive. Dentition is good. Mucous membranes are moist. His quality is normal. Neck is supple. On palpation there was a moderate thyroid goiter. Gland is diffusely nodular and relatively firm without tenderness. There is a dominant nodule in the mid right thyroid lobe which is very firm and measures approximately 3 cm in dimension. There is no anterior or posterior cervical lymphadenopathy. There are no supraclavicular masses. Lungs are clear to auscultation  without rales rhonchi or wheeze Cardiac exam shows regular rate and rhythm without significant murmur peripheral pulses are full Extremities are nontender without edema. Neurologically the patient is alert and oriented without deficit. There is no sign of tremor.   LABORATORY RESULTS: See E-Chart for most recent results   RADIOLOGY RESULTS: See E-Chart or I-Site for most recent results   IMPRESSION: Multinodular thyroid goiter with dominant right thyroid nodule, cytologic atypia on fine needle aspiration biopsy   PLAN: I had a lengthy discussion with the patient and her children. We discussed the findings on ultrasound and fine needle aspiration biopsy. We discussed the options for management including observation, thyroid lobectomy, and total thyroidectomy. I have recommended that she undergo total thyroidectomy for definitive diagnosis and for management of her multinodular goiter. We have discussed the risks and benefits of the procedure including the possibility of recurrent laryngeal nerve injury and injury to parathyroid glands. We discussed the hospital stay to be anticipated. We discussed her with her recovery and return to activities. We discussed the potential need for radioactive iodine treatment in the event of malignancy. She understands and wishes to proceed.  The risks and benefits of the procedure have been discussed at length with the patient.  The patient understands the proposed procedure, potential alternative treatments, and the course of recovery to be expected.  All of the patient's questions have been answered at this time.  The patient wishes to proceed with surgery and will schedule a date for their procedure through our office staff.

## 2011-04-14 ENCOUNTER — Ambulatory Visit
Admission: RE | Admit: 2011-04-14 | Discharge: 2011-04-14 | Disposition: A | Discharge status: Home / Self Care | Payer: BC Managed Care – PPO | Source: Ambulatory Visit | Attending: Obstetrics and Gynecology | Admitting: Obstetrics and Gynecology

## 2011-04-14 DIAGNOSIS — Z1231 Encounter for screening mammogram for malignant neoplasm of breast: Secondary | ICD-10-CM

## 2011-04-23 ENCOUNTER — Encounter (HOSPITAL_COMMUNITY): Payer: BC Managed Care – PPO

## 2011-04-23 ENCOUNTER — Other Ambulatory Visit (INDEPENDENT_AMBULATORY_CARE_PROVIDER_SITE_OTHER): Payer: Self-pay | Admitting: Surgery

## 2011-04-23 ENCOUNTER — Ambulatory Visit (HOSPITAL_COMMUNITY)
Admission: RE | Admit: 2011-04-23 | Discharge: 2011-04-23 | Disposition: A | Discharge status: Home / Self Care | Payer: BC Managed Care – PPO | Source: Ambulatory Visit | Attending: Surgery | Admitting: Surgery

## 2011-04-23 DIAGNOSIS — Z01818 Encounter for other preprocedural examination: Secondary | ICD-10-CM | POA: Insufficient documentation

## 2011-04-23 DIAGNOSIS — Z87891 Personal history of nicotine dependence: Secondary | ICD-10-CM | POA: Insufficient documentation

## 2011-04-23 DIAGNOSIS — Z01812 Encounter for preprocedural laboratory examination: Secondary | ICD-10-CM | POA: Insufficient documentation

## 2011-04-23 DIAGNOSIS — Z01811 Encounter for preprocedural respiratory examination: Secondary | ICD-10-CM | POA: Insufficient documentation

## 2011-04-23 DIAGNOSIS — E041 Nontoxic single thyroid nodule: Secondary | ICD-10-CM | POA: Insufficient documentation

## 2011-04-23 LAB — DIFFERENTIAL
Basophils Absolute: 0 10*3/uL (ref 0.0–0.1)
Basophils Relative: 0 % (ref 0–1)
Eosinophils Absolute: 0.1 10*3/uL (ref 0.0–0.7)
Eosinophils Relative: 2 % (ref 0–5)
Lymphocytes Relative: 34 % (ref 12–46)
Lymphs Abs: 1.8 10*3/uL (ref 0.7–4.0)
Monocytes Absolute: 0.5 10*3/uL (ref 0.1–1.0)
Monocytes Relative: 9 % (ref 3–12)
Neutro Abs: 3 10*3/uL (ref 1.7–7.7)
Neutrophils Relative %: 55 % (ref 43–77)

## 2011-04-23 LAB — SURGICAL PCR SCREEN
MRSA, PCR: NEGATIVE
Staphylococcus aureus: NEGATIVE

## 2011-04-23 LAB — BASIC METABOLIC PANEL
BUN: 18 mg/dL (ref 6–23)
CO2: 30 mEq/L (ref 19–32)
Calcium: 9.7 mg/dL (ref 8.4–10.5)
Chloride: 100 mEq/L (ref 96–112)
Creatinine, Ser: 0.77 mg/dL (ref 0.50–1.10)
GFR calc Af Amer: >60 mL/min (ref >60)
GFR calc non Af Amer: >60 mL/min (ref >60)
Glucose, Bld: 80 mg/dL (ref 70–99)
Potassium: 4.4 mEq/L (ref 3.5–5.1)
Sodium: 136 mEq/L (ref 135–145)

## 2011-04-23 LAB — PROTIME-INR
INR: 0.99 (ref 0.00–1.49)
Prothrombin Time: 13.3 seconds (ref 11.6–15.2)

## 2011-04-23 LAB — URINALYSIS, ROUTINE W REFLEX MICROSCOPIC
Bilirubin Urine: NEGATIVE
Glucose, UA: NEGATIVE mg/dL
Hgb urine dipstick: NEGATIVE
Ketones, ur: NEGATIVE mg/dL
Nitrite: NEGATIVE
Protein, ur: NEGATIVE mg/dL
Specific Gravity, Urine: 1.02 (ref 1.005–1.030)
Urobilinogen, UA: 0.2 mg/dL (ref 0.0–1.0)
pH: 6 (ref 5.0–8.0)

## 2011-04-23 LAB — CBC
HCT: 39.3 % (ref 36.0–46.0)
Hemoglobin: 12.7 g/dL (ref 12.0–15.0)
MCH: 27.9 pg (ref 26.0–34.0)
MCHC: 32.3 g/dL (ref 30.0–36.0)
MCV: 86.4 fL (ref 78.0–100.0)
Platelets: 251 10*3/uL (ref 150–400)
RBC: 4.55 MIL/uL (ref 3.87–5.11)
RDW: 12.9 % (ref 11.5–15.5)
WBC: 5.4 10*3/uL (ref 4.0–10.5)

## 2011-04-23 LAB — URINE MICROSCOPIC-ADD ON

## 2011-05-01 ENCOUNTER — Observation Stay (HOSPITAL_COMMUNITY)
Admission: RE | Admit: 2011-05-01 | Discharge: 2011-05-02 | Disposition: A | Discharge status: Home / Self Care | Payer: BC Managed Care – PPO | Source: Ambulatory Visit | Attending: Surgery | Admitting: Surgery

## 2011-05-01 ENCOUNTER — Other Ambulatory Visit (INDEPENDENT_AMBULATORY_CARE_PROVIDER_SITE_OTHER): Payer: Self-pay | Admitting: Surgery

## 2011-05-01 DIAGNOSIS — Z01818 Encounter for other preprocedural examination: Secondary | ICD-10-CM | POA: Insufficient documentation

## 2011-05-01 DIAGNOSIS — E042 Nontoxic multinodular goiter: Principal | ICD-10-CM | POA: Insufficient documentation

## 2011-05-01 DIAGNOSIS — Z79899 Other long term (current) drug therapy: Secondary | ICD-10-CM | POA: Insufficient documentation

## 2011-05-01 DIAGNOSIS — C73 Malignant neoplasm of thyroid gland: Secondary | ICD-10-CM

## 2011-05-01 DIAGNOSIS — Z01812 Encounter for preprocedural laboratory examination: Secondary | ICD-10-CM | POA: Insufficient documentation

## 2011-05-01 DIAGNOSIS — E785 Hyperlipidemia, unspecified: Secondary | ICD-10-CM | POA: Insufficient documentation

## 2011-05-01 DIAGNOSIS — E039 Hypothyroidism, unspecified: Secondary | ICD-10-CM | POA: Insufficient documentation

## 2011-05-01 DIAGNOSIS — K219 Gastro-esophageal reflux disease without esophagitis: Secondary | ICD-10-CM | POA: Insufficient documentation

## 2011-05-01 HISTORY — PX: TOTAL THYROIDECTOMY: SHX2547

## 2011-05-01 LAB — CALCIUM: Calcium: 8.6 mg/dL (ref 8.4–10.5)

## 2011-05-02 LAB — CALCIUM: Calcium: 8.3 mg/dL — ABNORMAL LOW (ref 8.4–10.5)

## 2011-05-04 ENCOUNTER — Telehealth (INDEPENDENT_AMBULATORY_CARE_PROVIDER_SITE_OTHER): Payer: Self-pay | Admitting: Surgery

## 2011-05-04 DIAGNOSIS — C73 Malignant neoplasm of thyroid gland: Secondary | ICD-10-CM

## 2011-05-04 NOTE — Telephone Encounter (Signed)
Tried cell phone number to give path results.  Will call again tomorrow.  Left message. TMG

## 2011-05-04 NOTE — Telephone Encounter (Signed)
Path results available.  Will call back. TMG

## 2011-05-04 NOTE — Telephone Encounter (Signed)
Called results of total thyroidectomy to patient.  Will arrange for follow-up with Dr. Talmage Nap. TMG

## 2011-05-05 ENCOUNTER — Other Ambulatory Visit (INDEPENDENT_AMBULATORY_CARE_PROVIDER_SITE_OTHER): Payer: Self-pay | Admitting: Surgery

## 2011-05-05 DIAGNOSIS — C73 Malignant neoplasm of thyroid gland: Secondary | ICD-10-CM

## 2011-05-06 ENCOUNTER — Other Ambulatory Visit: Payer: Self-pay | Admitting: Gastroenterology

## 2011-05-06 ENCOUNTER — Other Ambulatory Visit: Payer: Self-pay

## 2011-05-06 MED ORDER — ESOMEPRAZOLE MAGNESIUM 40 MG PO CPDR: 40.0000 mg | DELAYED_RELEASE_CAPSULE | Freq: Every day | ORAL | Status: DC

## 2011-05-06 NOTE — Telephone Encounter (Signed)
Prescription faxed to her mail order.

## 2011-05-06 NOTE — Telephone Encounter (Signed)
Faxed prescription to [facility] again.

## 2011-05-07 NOTE — Telephone Encounter (Signed)
yif

## 2011-05-12 ENCOUNTER — Encounter (INDEPENDENT_AMBULATORY_CARE_PROVIDER_SITE_OTHER): Payer: Self-pay | Admitting: Surgery

## 2011-05-12 ENCOUNTER — Ambulatory Visit (INDEPENDENT_AMBULATORY_CARE_PROVIDER_SITE_OTHER): Payer: BC Managed Care – PPO | Admitting: Surgery

## 2011-05-12 DIAGNOSIS — C73 Malignant neoplasm of thyroid gland: Secondary | ICD-10-CM

## 2011-05-12 NOTE — Patient Instructions (Signed)
Begin applying cocoa butter as instructed.

## 2011-05-12 NOTE — Progress Notes (Signed)
Visit Diagnoses: 1. Thyroid cancer     HISTORY: Patient is a 55 year old female who underwent total thyroidectomy on May 01, 2011. Final pathology shows a follicular variant of papillary thyroid carcinoma, oncocytic type, measuring 6 mm in size. Lymph nodes were negative. Margins were negative.   EXAM: Surgical wound is healing nicely. Steri-Strips are removed in the office today. Mild soft tissue swelling. No sign of infection. Voice quality is normal.   IMPRESSION: Follicular variant of papillary thyroid carcinoma, T1, N0   PLAN: Patient is scheduled to see her endocrinologist tomorrow. I feel she will require radioactive iodine treatment. She will also need a total body iodine scan. She will return to see me in 6 weeks for wound check.    Velora Heckler, MD, FACS General & Endocrine Surgery [facility]

## 2011-05-12 NOTE — Op Note (Signed)
[name], [name]               ACCOUNT NO.:  [id number]  MEDICAL RECORD NO.:  [id number]  LOCATION:  1533                         FACILITY:  [facility]  PHYSICIAN:  Velora Heckler, MD      DATE OF BIRTH:  1956-07-15  DATE OF PROCEDURE:  05/01/2011                               OPERATIVE REPORT   PREOPERATIVE DIAGNOSIS:  Multinodular thyroid goiter with cytologic atypia.  POSTOPERATIVE DIAGNOSIS:  Multinodular thyroid goiter with cytologic atypia.  PROCEDURE:  Total thyroidectomy.  SURGEON:  Velora Heckler, M.D., FACS  ANESTHESIA:  General.  ESTIMATED BLOOD LOSS:  Minimal.  PREPARATION:  ChloraPrep.  COMPLICATIONS:  None.  INDICATIONS:  The patient is a 55 year old white female referred for multinodular thyroid goiter with cytologic atypia.  The patient had undergone fine-needle aspiration biopsy of a dominant nodule in the right thyroid lobe.  This showed atypia with intranuclear grooves. Multiple other nodules were noted.  The patient now comes to surgery for thyroidectomy for definitive diagnosis.  BODY OF REPORT:  Procedure was done in OR #11 at the [facility].  The patient was brought to the operating room, placed in supine position on the operating room table.  Following administration of general anesthesia, the patient was positioned and then prepped and draped in the usual strict aseptic fashion.  After ascertaining that an adequate level of anesthesia been achieved, a Kocher incision was made with a #15 blade.  Dissection was carried through subcutaneous tissues and platysma.  Hemostasis was obtained with the electrocautery.  Skin flaps were elevated cephalad and caudad from the thyroid notch to the sternal notch.  A Mahorner self-retaining retractor was placed for exposure.  Strap muscles were incised in the midline.  Dissection was begun on the left side.  Strap muscles were reflected laterally.  Left lobe was multinodular.  There was a  dominant hard firm nodule in the superior pole consistent with a papillary thyroid carcinoma.  Lobe was gently dissected out with blunt dissection. Superior pole vessels were dissected out individually and divided between medium Ligaclips with the harmonic scalpel.  Venous tributaries were divided between Ligaclips with the harmonic scalpel.  Both the superior and inferior parathyroid glands were identified and dissected out.  There are maintained on their vascular pedicle and preserved. Branches of the inferior thyroid artery are divided between small Ligaclips with the harmonic scalpel.  Recurrent laryngeal nerve was identified and preserved.  Ligament of Allyson Sabal was released with electrocautery and the gland was mobilized onto the anterior trachea. Isthmus was mobilized across the midline.  A moderate-sized pyramidal lobe was also dissected off the thyroid cartilage and resected on block with the thyroid isthmus.  Dry pack was placed in the left neck.  Next, we turned our attention to the right thyroid lobe.  Again, strap muscles were reflected laterally.  The right lobe was markedly larger. There was a dominant nodule measuring at least 3 cm in size in the superior pole.  Superior pole vessels were dissected out individually and divided between Ligaclips with the harmonic scalpel.  Middle thyroid vein was divided between Ligaclips with the harmonic scalpel.  Inferior venous tributaries were also divided between Ligaclips  with the harmonic scalpel.  Gland was rolled anteriorly.  Parathyroid tissue was identified and preserved.  Branches of the inferior thyroid artery were divided between small and medium Ligaclips with the harmonic scalpel. Recurrent nerve was identified and preserved.  Ligament of Allyson Sabal was released and the gland was mobilized up and onto the anterior trachea. It was excised off the trachea with the electrocautery.  A number of small lymph nodes are resected from the  central compartment overlying the trachea included on block with the thyroid for review.  Entire specimen was submitted to pathology.  A suture was used to mark the left superior pole.  Neck was irrigated with warm saline.  Good hemostasis was achieved throughout.  Surgicel was placed in the operative field.  Strap muscles were reapproximated in the midline with interrupted 3-0 Vicryl sutures. Platysma was closed with interrupted 3-0 Vicryl sutures.  Skin was closed with running 4-0 Monocryl subcuticular suture.  Wound was washed and dried and Benzoin and Steri-Strips were applied.  Sterile dressings were applied.  The patient was awakened from anesthesia and brought to the recovery room.  The patient tolerated the procedure well.   Velora Heckler, MD, FACS     TMG/MEDQ  D:  05/01/2011  T:  05/01/2011  Job:  [id number]  cc:   Dorisann Frames, M.D. Fax: [phone]  Ellin Saba., MD [address] [place] [address]  Electronically Signed by Darnell Level MD on 05/12/2011 08:52:04 PM

## 2011-05-15 ENCOUNTER — Telehealth: Payer: Self-pay | Admitting: Gastroenterology

## 2011-05-15 NOTE — Telephone Encounter (Signed)
Left a message with patient and informed her that we will contact her when we receive the Nexium in the mail.

## 2011-05-15 NOTE — Discharge Summary (Signed)
  [name], [name]               ACCOUNT NO.:  [id number]  MEDICAL RECORD NO.:  [id number]  LOCATION:  1533                         FACILITY:  [facility]  PHYSICIAN:  Velora Heckler, MD      DATE OF BIRTH:  [birth year]  DATE OF ADMISSION:  05/01/2011 DATE OF DISCHARGE:  05/02/2011                              DISCHARGE SUMMARY   REASON FOR ADMISSION:  Multinodular thyroid goiter with cytologic atypia.  BRIEF HISTORY:  The patient is a 55 year old white female referred by her primary physician with multinodular goiter.  Fine needle aspiration biopsy of a dominant nodule in the right lobe showed atypia with intranuclear grooves.  Multiple other nodules were present.  The patient now comes to Surgery for thyroidectomy.  HOSPITAL COURSE:  The patient was admitted to [facility] on May 01, 2011.  She was taken directly to the operating room where she underwent total thyroidectomy without complication. Postoperative course was uneventful.  She was monitored overnight. Followup serum calcium level was low normal at 8.3.  The patient was prepared for discharge home on the first postoperative day.  DISCHARGE PLANNING:  The patient was discharged home on May 02, 2011, in good condition, tolerating a regular diet, and ambulating independently.  Discharge medications included Vicodin as needed for pain.  The patient will be seen back in my office at [facility] in 2 weeks.  Serum calcium level will be checked prior to her followup visit.  FINAL DIAGNOSIS:  Multinodular thyroid with cytologic atypia, final pathologic results pending at the time of discharge.  CONDITION AT DISCHARGE:  Good.     Velora Heckler, MD     TMG/MEDQ  D:  05/14/2011  T:  05/14/2011  Job:  [id number]  cc:   Ellin Saba., MD [address] [place] [address]  Dorisann Frames, M.D. Fax: [phone]  Electronically Signed by Darnell Level MD on 05/15/2011  08:03:54 AM

## 2011-05-18 ENCOUNTER — Other Ambulatory Visit (HOSPITAL_COMMUNITY): Payer: Self-pay | Admitting: Endocrinology

## 2011-05-18 DIAGNOSIS — C73 Malignant neoplasm of thyroid gland: Secondary | ICD-10-CM

## 2011-05-20 ENCOUNTER — Telehealth (INDEPENDENT_AMBULATORY_CARE_PROVIDER_SITE_OTHER): Payer: Self-pay

## 2011-05-20 NOTE — Telephone Encounter (Signed)
Returned patients call c/o food hanging up and feeling of swelling in neck.  She has hx of reflux with stricture.  Per Dr. Gerrit Friends take small bites, surgery still fresh will revisit problem at follow up. If condition worsen before office visit call back. Also may consider call GI doctor.

## 2011-06-15 ENCOUNTER — Encounter (HOSPITAL_COMMUNITY)
Admission: RE | Admit: 2011-06-15 | Discharge: 2011-06-15 | Disposition: A | Discharge status: Home / Self Care | Payer: BC Managed Care – PPO | Source: Ambulatory Visit | Attending: Endocrinology | Admitting: Endocrinology

## 2011-06-15 DIAGNOSIS — C73 Malignant neoplasm of thyroid gland: Secondary | ICD-10-CM

## 2011-06-16 ENCOUNTER — Encounter (HOSPITAL_COMMUNITY)
Admission: RE | Admit: 2011-06-16 | Discharge: 2011-06-16 | Disposition: A | Discharge status: Home / Self Care | Payer: BC Managed Care – PPO | Source: Ambulatory Visit | Attending: Endocrinology | Admitting: Endocrinology

## 2011-06-17 ENCOUNTER — Encounter (HOSPITAL_COMMUNITY): Payer: Self-pay

## 2011-06-17 ENCOUNTER — Encounter (HOSPITAL_COMMUNITY)
Admission: RE | Admit: 2011-06-17 | Discharge: 2011-06-17 | Disposition: A | Discharge status: Home / Self Care | Payer: BC Managed Care – PPO | Source: Ambulatory Visit | Attending: Endocrinology | Admitting: Endocrinology

## 2011-06-17 MED ORDER — SODIUM IODIDE I 131 CAPSULE
101.4000 | Freq: Once | INTRAVENOUS | Status: AC | PRN
  Administered 2011-06-17: 101.4 via ORAL

## 2011-06-23 ENCOUNTER — Ambulatory Visit (INDEPENDENT_AMBULATORY_CARE_PROVIDER_SITE_OTHER): Payer: BC Managed Care – PPO | Admitting: Surgery

## 2011-06-23 ENCOUNTER — Encounter (INDEPENDENT_AMBULATORY_CARE_PROVIDER_SITE_OTHER): Payer: Self-pay | Admitting: Surgery

## 2011-06-23 VITALS — BP 120/88 | HR 72 | Temp 97.0°F | Resp 12 | Ht 67.0 in | Wt 174.6 lb

## 2011-06-23 DIAGNOSIS — C73 Malignant neoplasm of thyroid gland: Secondary | ICD-10-CM

## 2011-06-23 NOTE — Progress Notes (Signed)
Visit Diagnoses: 1. Thyroid cancer     HISTORY: The patient returns for her second postoperative visit. She has seen her endocrinologist and has undergone treatment with radioactive iodine last week. She is due for a total body iodine scan tomorrow. She is taking levothyroxine 137 mcg daily.   EXAM: Surgical wound is healing nicely. Mild soft tissue swelling. Voice quality is normal.  No sign of infection.   IMPRESSION: Follicular variant of papillary thyroid carcinoma, 0.6 mm - T1a, NO, Mx.   PLAN: The patient and her husband and I discussed the above issues. She will undergo total body iodine scanning tomorrow. We will check those results. She is scheduled to have laboratory studies with her endocrinologist in December. She will return to see me for physical examination in 4 months.   Velora Heckler, MD, FACS General & Endocrine Surgery [facility]

## 2011-06-23 NOTE — Patient Instructions (Signed)
  COCOA BUTTER & VITAMIN E CREAM  (Palmer's or other brand)  Apply cocoa butter/vitamin E cream to your incision 2 - 3 times daily.  Massage cream into incision for one minute with each application.  Use sunscreen (50 SPF or higher) for first 6 months after surgery.  You may substitute Mederma or other scar reducing creams as desired.   

## 2011-06-24 ENCOUNTER — Encounter (HOSPITAL_COMMUNITY)
Admission: RE | Admit: 2011-06-24 | Discharge: 2011-06-24 | Disposition: A | Discharge status: Home / Self Care | Payer: BC Managed Care – PPO | Source: Ambulatory Visit | Attending: Endocrinology | Admitting: Endocrinology

## 2011-06-24 DIAGNOSIS — C73 Malignant neoplasm of thyroid gland: Secondary | ICD-10-CM | POA: Insufficient documentation

## 2011-06-24 LAB — COMPREHENSIVE METABOLIC PANEL
ALT: 14
AST: 15
Albumin: 3.8
Alkaline Phosphatase: 96
BUN: 9
CO2: 27
Calcium: 8.8
Chloride: 105
Creatinine, Ser: 0.81
GFR calc Af Amer: >60
GFR calc non Af Amer: >60
Glucose, Bld: 93
Potassium: 4.3
Sodium: 137
Total Bilirubin: 0.8
Total Protein: 6.9

## 2011-06-24 LAB — URINALYSIS, ROUTINE W REFLEX MICROSCOPIC
Glucose, UA: NEGATIVE
Ketones, ur: NEGATIVE
Leukocytes, UA: NEGATIVE
Nitrite: NEGATIVE
Protein, ur: NEGATIVE
Specific Gravity, Urine: >1.03 — ABNORMAL HIGH
Urobilinogen, UA: 0.2
pH: 5

## 2011-06-24 LAB — URINE MICROSCOPIC-ADD ON

## 2011-06-24 LAB — LIPASE, BLOOD: Lipase: 24

## 2011-06-24 LAB — CBC
HCT: 35.7 — ABNORMAL LOW
Hemoglobin: 12.3
MCHC: 34.4
MCV: 85.5
Platelets: 291
RBC: 4.17
RDW: 13.9
WBC: 6.5

## 2011-06-24 LAB — POCT PREGNANCY, URINE
Operator id: 24537
Preg Test, Ur: NEGATIVE

## 2011-06-24 LAB — AMYLASE: Amylase: 45

## 2011-06-25 ENCOUNTER — Telehealth: Payer: Self-pay

## 2011-06-25 NOTE — Telephone Encounter (Signed)
Pt states she went to the surgeon and the paper she was given stated she was "exogenous obese" . Pt would like this taken out of her chart.

## 2011-09-01 ENCOUNTER — Other Ambulatory Visit: Payer: Self-pay | Admitting: Specialist

## 2011-09-01 DIAGNOSIS — M25512 Pain in left shoulder: Secondary | ICD-10-CM

## 2011-09-07 ENCOUNTER — Telehealth: Payer: Self-pay | Admitting: Gastroenterology

## 2011-09-07 NOTE — Telephone Encounter (Signed)
Patient states she saw a article in the newspaper regarding taking Nexium for a long period of time and she has tried to discontinue this medication for that reason. She states her heartburn and reflux symptoms have came back but she has tried to change her diet and has taken different vitamins to try to control these symptoms. She wants to know what Dr. Russella Dar thinks regarding her stopping her Nexium and the long term use of this medication. I told her she needs to discuss this with him and to come in for a office appt. Pt agreed and will come tomorrow morning at 9:15am.

## 2011-09-08 ENCOUNTER — Encounter: Payer: Self-pay | Admitting: Gastroenterology

## 2011-09-08 ENCOUNTER — Ambulatory Visit (INDEPENDENT_AMBULATORY_CARE_PROVIDER_SITE_OTHER): Payer: BC Managed Care – PPO | Admitting: Gastroenterology

## 2011-09-08 VITALS — BP 128/74 | HR 80 | Ht 67.0 in | Wt 174.0 lb

## 2011-09-08 DIAGNOSIS — K2270 Barrett's esophagus without dysplasia: Secondary | ICD-10-CM

## 2011-09-08 DIAGNOSIS — K219 Gastro-esophageal reflux disease without esophagitis: Secondary | ICD-10-CM

## 2011-09-08 MED ORDER — RANITIDINE HCL 150 MG PO CAPS: 150.0000 mg | ORAL_CAPSULE | Freq: Two times a day (BID) | ORAL | Status: DC

## 2011-09-08 NOTE — Patient Instructions (Signed)
Your prescription for ranitidine has been sent to your pharmacy.

## 2011-09-08 NOTE — Progress Notes (Signed)
History of Present Illness: This is a 55 year old female with a history of GERD and Barrett's esophagus who returns today with worsening reflux symptoms off Nexium for the past month. She has multiple questions and concerns regarding treatment for GERD, the use of PPIs and their side effects. She states she read an article in the News and Record 2 months ago outlining PPI related problems. Denies weight loss, abdominal pain, constipation, diarrhea, change in stool caliber, melena, hematochezia, nausea, vomiting, dysphagia, chest pain.  Current Medications, Allergies, Past Medical History, Past Surgical History, Family History and Social History were reviewed in [employer] record.  Physical Exam: General: Well developed , well nourished, no acute distress Head: Normocephalic and atraumatic Eyes:  sclerae anicteric, EOMI Ears: Normal auditory acuity Mouth: No deformity or lesions Psychological:  Alert and cooperative. Anxious.  Assessment and Recommendations:  1. GERD with a history of Barrett's esophagus. I spent approximately 30 minutes counseling the patient on GERD, hiatal hernias, Barrett's esophagus, treatment of GERD, potential side effects of PPIs used long term, acid rebound phenomenon, osteoporosis, magnesium deficiency, increased incidence of intestinal infections, other potential side effects and the role of H2 blockers in the management of GERD and Barrett's esophagus. She has significant health related anxiety. After this discussion it is clear that she still has many concerns about taking PPIs long term despite the fact that PPIs are very effective in controlling her symptoms and likely the most effective for GERD with complications such as Barrett's. She would like to use H2 blockers instead of PPIs if at all possible. She is symptomatic on ranitidine 150 mg twice daily and therefore we will increase her ranitidine 300 mg twice daily. If this is not adequate to  control her symptoms consider instituting a lower strength PPI such as omeprazole 20 mg or omeprazole 40 mg daily instead of Nexium.

## 2011-09-25 ENCOUNTER — Other Ambulatory Visit: Payer: Self-pay | Admitting: Gastroenterology

## 2011-09-25 MED ORDER — ESOMEPRAZOLE MAGNESIUM 20 MG PO CPDR: 20.0000 mg | DELAYED_RELEASE_CAPSULE | Freq: Every day | ORAL | Status: DC

## 2011-09-25 MED ORDER — RANITIDINE HCL 150 MG PO CAPS: 300.0000 mg | ORAL_CAPSULE | Freq: Two times a day (BID) | ORAL | Status: DC

## 2011-09-25 NOTE — Telephone Encounter (Signed)
Patient states she has noticed over the holiday season that the ranitidine is ineffective in controlling her symptoms. Patient states the Nexium she was on did control her symptoms very well but would like to try a lower dose instead. I told patient that we normally would send in Nexium 40 mg one tablet by mouth once daily and increase to twice daily if needed. Patient states she discussed stopping PPI's all together at the last visit  but realized she cannot do that and control her symptoms. Told her that Dr. Russella Dar recommended she start back on omeprazole 20 or 40 mg at the last office visit instead of Nexium.  Patient states she would really like to go back on Nexium but take the 20 mg tablets first. Told her we normally do not send in the 20 mg tablets but I will ask Dr. Russella Dar to see if this is a problem. After she would like the new prescription sent to fax number [phone] [facility].

## 2011-09-25 NOTE — Telephone Encounter (Signed)
Patient arrived in the office to speak to me and she states she only tried the ranitidine for one day and then decided that is was not helping control her symptoms. Told patient that she needs to take ranitidine for a entire week and give it a chance to work and then if it does not to pick up her prescription for Nexium 20 once daily. Pt agreed and verbalized understanding.

## 2011-09-25 NOTE — Telephone Encounter (Signed)
Left a message informing patient of Dr. Ardell Isaacs response and for her to call me back to verify that she did take ranitidine 300 mg twice daily. Told her that I will send the new prescription and information needed to [facility].

## 2011-09-25 NOTE — Telephone Encounter (Signed)
Please be sure she tried ranitidine 300 mg po bid and if this was not effective we can give her Nexium 20 mg po qam as she requests.

## 2011-10-07 ENCOUNTER — Other Ambulatory Visit: Payer: Self-pay

## 2011-10-07 MED ORDER — ESOMEPRAZOLE MAGNESIUM 20 MG PO CPDR: 20.0000 mg | DELAYED_RELEASE_CAPSULE | Freq: Every day | ORAL | Status: DC

## 2011-10-08 ENCOUNTER — Other Ambulatory Visit: Payer: Self-pay | Admitting: Otolaryngology

## 2011-10-09 ENCOUNTER — Encounter (HOSPITAL_BASED_OUTPATIENT_CLINIC_OR_DEPARTMENT_OTHER): Payer: Self-pay | Admitting: *Deleted

## 2011-10-09 NOTE — Progress Notes (Signed)
NPO AFTER MN WITH EXCEPTION WATER/ GATORADE/ BROTH UNTIL 0700. ARRIVES AT 1100. NEEDS HG. CURRENT EKG IN EPIC. REVIEWED [facility] GUIDELINES, WILL BRING MEDS.

## 2011-10-12 ENCOUNTER — Ambulatory Visit (HOSPITAL_BASED_OUTPATIENT_CLINIC_OR_DEPARTMENT_OTHER): Payer: BC Managed Care – PPO | Admitting: Anesthesiology

## 2011-10-12 ENCOUNTER — Encounter (HOSPITAL_BASED_OUTPATIENT_CLINIC_OR_DEPARTMENT_OTHER)
Admission: RE | Disposition: A | Discharge status: Home / Self Care | Payer: Self-pay | Source: Ambulatory Visit | Attending: Specialist

## 2011-10-12 ENCOUNTER — Encounter (HOSPITAL_BASED_OUTPATIENT_CLINIC_OR_DEPARTMENT_OTHER): Payer: Self-pay | Admitting: *Deleted

## 2011-10-12 ENCOUNTER — Ambulatory Visit (HOSPITAL_BASED_OUTPATIENT_CLINIC_OR_DEPARTMENT_OTHER)
Admission: RE | Admit: 2011-10-12 | Discharge: 2011-10-12 | Disposition: A | Discharge status: Home / Self Care | Payer: BC Managed Care – PPO | Source: Ambulatory Visit | Attending: Specialist | Admitting: Specialist

## 2011-10-12 ENCOUNTER — Encounter (HOSPITAL_BASED_OUTPATIENT_CLINIC_OR_DEPARTMENT_OTHER): Payer: Self-pay | Admitting: Anesthesiology

## 2011-10-12 DIAGNOSIS — M24119 Other articular cartilage disorders, unspecified shoulder: Secondary | ICD-10-CM | POA: Insufficient documentation

## 2011-10-12 DIAGNOSIS — M7500 Adhesive capsulitis of unspecified shoulder: Secondary | ICD-10-CM | POA: Insufficient documentation

## 2011-10-12 DIAGNOSIS — M25819 Other specified joint disorders, unspecified shoulder: Secondary | ICD-10-CM | POA: Insufficient documentation

## 2011-10-12 DIAGNOSIS — Z79899 Other long term (current) drug therapy: Secondary | ICD-10-CM | POA: Insufficient documentation

## 2011-10-12 DIAGNOSIS — K219 Gastro-esophageal reflux disease without esophagitis: Secondary | ICD-10-CM | POA: Insufficient documentation

## 2011-10-12 DIAGNOSIS — Z7982 Long term (current) use of aspirin: Secondary | ICD-10-CM | POA: Insufficient documentation

## 2011-10-12 DIAGNOSIS — K449 Diaphragmatic hernia without obstruction or gangrene: Secondary | ICD-10-CM | POA: Insufficient documentation

## 2011-10-12 DIAGNOSIS — E039 Hypothyroidism, unspecified: Secondary | ICD-10-CM | POA: Insufficient documentation

## 2011-10-12 DIAGNOSIS — E042 Nontoxic multinodular goiter: Secondary | ICD-10-CM | POA: Insufficient documentation

## 2011-10-12 DIAGNOSIS — K2270 Barrett's esophagus without dysplasia: Secondary | ICD-10-CM | POA: Insufficient documentation

## 2011-10-12 DIAGNOSIS — C73 Malignant neoplasm of thyroid gland: Secondary | ICD-10-CM | POA: Insufficient documentation

## 2011-10-12 DIAGNOSIS — M7540 Impingement syndrome of unspecified shoulder: Secondary | ICD-10-CM

## 2011-10-12 HISTORY — PX: OTHER SURGICAL HISTORY: SHX169

## 2011-10-12 LAB — POCT HEMOGLOBIN-HEMACUE: Hemoglobin: 12.6 g/dL (ref 12.0–15.0)

## 2011-10-12 SURGERY — EXAM UNDER ANESTHESIA, SHOULDER, WITH MANIPULATION
Anesthesia: General | Site: Shoulder | Laterality: Left | Wound class: Clean

## 2011-10-12 MED ORDER — PROMETHAZINE HCL 25 MG/ML IJ SOLN: 6.2500 mg | INTRAMUSCULAR | Status: DC | PRN

## 2011-10-12 MED ORDER — KETOROLAC TROMETHAMINE 30 MG/ML IJ SOLN
INTRAMUSCULAR | Status: DC | PRN
  Administered 2011-10-12: 30 mg via INTRAVENOUS

## 2011-10-12 MED ORDER — SODIUM CHLORIDE 0.9 % IR SOLN
Status: DC | PRN
  Administered 2011-10-12: 14:00:00

## 2011-10-12 MED ORDER — HYDROCODONE-ACETAMINOPHEN 7.5-325 MG PO TABS: 1.0000 | ORAL_TABLET | ORAL | Status: AC | PRN

## 2011-10-12 MED ORDER — GLYCOPYRROLATE 0.2 MG/ML IJ SOLN
INTRAMUSCULAR | Status: DC | PRN
  Administered 2011-10-12: 0.2 mg via INTRAVENOUS

## 2011-10-12 MED ORDER — ROPIVACAINE HCL 5 MG/ML IJ SOLN
INTRAMUSCULAR | Status: DC | PRN
  Administered 2011-10-12: 30 mL

## 2011-10-12 MED ORDER — SUCCINYLCHOLINE CHLORIDE 20 MG/ML IJ SOLN
INTRAMUSCULAR | Status: DC | PRN
  Administered 2011-10-12: 100 mg via INTRAVENOUS

## 2011-10-12 MED ORDER — FENTANYL CITRATE 0.05 MG/ML IJ SOLN
INTRAMUSCULAR | Status: DC | PRN
  Administered 2011-10-12: 50 ug via INTRAVENOUS
  Administered 2011-10-12 (×2): 25 ug via INTRAVENOUS

## 2011-10-12 MED ORDER — BUPIVACAINE-EPINEPHRINE 0.25% -1:200000 IJ SOLN
INTRAMUSCULAR | Status: DC | PRN
  Administered 2011-10-12: 20 mL

## 2011-10-12 MED ORDER — ONDANSETRON HCL 4 MG/2ML IJ SOLN
INTRAMUSCULAR | Status: DC | PRN
  Administered 2011-10-12: 4 mg via INTRAVENOUS

## 2011-10-12 MED ORDER — KETOROLAC TROMETHAMINE 10 MG PO TABS: 10.0000 mg | ORAL_TABLET | Freq: Four times a day (QID) | ORAL | Status: AC | PRN

## 2011-10-12 MED ORDER — FENTANYL CITRATE 0.05 MG/ML IJ SOLN: 25.0000 ug | INTRAMUSCULAR | Status: DC | PRN

## 2011-10-12 MED ORDER — LACTATED RINGERS IV SOLN
INTRAVENOUS | Status: DC
  Administered 2011-10-12: 100 mL/h via INTRAVENOUS
  Administered 2011-10-12: 15:00:00 via INTRAVENOUS

## 2011-10-12 MED ORDER — MIDAZOLAM HCL 2 MG/2ML IJ SOLN
2.0000 mg | Freq: Once | INTRAMUSCULAR | Status: AC
  Administered 2011-10-12: 2 mg via INTRAVENOUS

## 2011-10-12 MED ORDER — FENTANYL CITRATE 0.05 MG/ML IJ SOLN
25.0000 ug | INTRAMUSCULAR | Status: DC | PRN
  Administered 2011-10-12: 50 ug via INTRAVENOUS

## 2011-10-12 MED ORDER — CHLORHEXIDINE GLUCONATE 4 % EX LIQD: 60.0000 mL | Freq: Once | CUTANEOUS | Status: DC

## 2011-10-12 MED ORDER — FENTANYL BOLUS VIA INFUSION
25.0000 ug | Freq: Once | INTRAVENOUS | Status: AC
  Administered 2011-10-12: 25 ug via INTRAVENOUS

## 2011-10-12 MED ORDER — PROPOFOL 10 MG/ML IV EMUL
INTRAVENOUS | Status: DC | PRN
  Administered 2011-10-12: 200 mg via INTRAVENOUS
  Administered 2011-10-12: 20 mg via INTRAVENOUS

## 2011-10-12 MED ORDER — PROMETHAZINE HCL 12.5 MG PO TABS
12.5000 mg | ORAL_TABLET | Freq: Four times a day (QID) | ORAL | Status: DC | PRN
Start: 2011-10-12 — End: 2014-08-28

## 2011-10-12 MED ORDER — MIDAZOLAM HCL 5 MG/5ML IJ SOLN
INTRAMUSCULAR | Status: DC | PRN
  Administered 2011-10-12: 2 mg via INTRAVENOUS

## 2011-10-12 MED ORDER — CEFAZOLIN SODIUM 1-5 GM-% IV SOLN
1.0000 g | INTRAVENOUS | Status: AC
  Administered 2011-10-12: 1 g via INTRAVENOUS

## 2011-10-12 MED ORDER — EPHEDRINE SULFATE 50 MG/ML IJ SOLN
INTRAMUSCULAR | Status: DC | PRN
  Administered 2011-10-12: 10 mg via INTRAVENOUS
  Administered 2011-10-12: 5 mg via INTRAVENOUS
  Administered 2011-10-12: 10 mg via INTRAVENOUS

## 2011-10-12 MED ORDER — LIDOCAINE HCL (CARDIAC) 20 MG/ML IV SOLN
INTRAVENOUS | Status: DC | PRN
  Administered 2011-10-12: 60 mg via INTRAVENOUS

## 2011-10-12 MED ORDER — STERILE WATER FOR IRRIGATION IR SOLN
Status: DC | PRN
  Administered 2011-10-12: 500 mL

## 2011-10-12 SURGICAL SUPPLY — 79 items
APL SKNCLS STERI-STRIP NONHPOA (GAUZE/BANDAGES/DRESSINGS)
BENZOIN TINCTURE PRP APPL 2/3 (GAUZE/BANDAGES/DRESSINGS) IMPLANT
BLADE 4.2CUDA (BLADE) ×3 IMPLANT
BLADE CUDA 4.2 (BLADE) ×1 IMPLANT
BLADE CUDA SHAVER 3.5 (BLADE) ×3 IMPLANT
BLADE CUTTER GATOR 3.5 (BLADE) IMPLANT
BLADE FLAT COURSE (BLADE) IMPLANT
BLADE GREAT WHITE 4.2 (BLADE) IMPLANT
BLADE SURG 11 STRL SS (BLADE) ×3 IMPLANT
BNDG COHESIVE 4X5 TAN NS LF (GAUZE/BANDAGES/DRESSINGS) ×3 IMPLANT
BUR OVAL 4.0 (BURR) IMPLANT
BUR VERTEX HOODED 4.5 (BURR) IMPLANT
CANISTER SUCT LVC 12 LTR MEDI- (MISCELLANEOUS) ×3 IMPLANT
CANISTER SUCTION 2500CC (MISCELLANEOUS) IMPLANT
CANNULA ACUFLEX KIT 5X76 (CANNULA) IMPLANT
CANNULA SHOULDER 7CM (CANNULA) ×3 IMPLANT
CANNULA TWIST IN 8.25X7CM (CANNULA) IMPLANT
CLEANER CAUTERY TIP 5X5 PAD (MISCELLANEOUS) IMPLANT
CLOTH BEACON ORANGE TIMEOUT ST (SAFETY) ×3 IMPLANT
DRAPE LG THREE QUARTER DISP (DRAPES) IMPLANT
DRAPE STERI 35X30 U-POUCH (DRAPES) ×3 IMPLANT
DRAPE U 20/CS (DRAPES) ×6 IMPLANT
DRSG ADAPTIC 3X8 NADH LF (GAUZE/BANDAGES/DRESSINGS) IMPLANT
DRSG PAD ABDOMINAL 8X10 ST (GAUZE/BANDAGES/DRESSINGS) ×2 IMPLANT
DURAPREP 26ML APPLICATOR (WOUND CARE) ×3 IMPLANT
ELECT MENISCUS 165MM 90D (ELECTRODE) IMPLANT
ELECT NDL TIP 2.8 STRL (NEEDLE) ×1 IMPLANT
ELECT NEEDLE TIP 2.8 STRL (NEEDLE) ×3 IMPLANT
ELECT REM PT RETURN 9FT ADLT (ELECTROSURGICAL) ×3
ELECTRODE REM PT RTRN 9FT ADLT (ELECTROSURGICAL) ×2 IMPLANT
GLOVE BIO SURGEON STRL SZ 6.5 (GLOVE) ×3 IMPLANT
GLOVE BIOGEL PI IND STRL 6.5 (GLOVE) ×1 IMPLANT
GLOVE BIOGEL PI INDICATOR 6.5 (GLOVE) ×1
GLOVE ECLIPSE 6.5 STRL STRAW (GLOVE) ×2 IMPLANT
GLOVE INDICATOR 7.0 STRL GRN (GLOVE) ×3 IMPLANT
GLOVE SURG SS PI 8.0 STRL IVOR (GLOVE) ×3 IMPLANT
GOWN STRL NON-REIN LRG LVL3 (GOWN DISPOSABLE) ×3 IMPLANT
NDL 1/2 CIR CATGUT .05X1.09 (NEEDLE) IMPLANT
NDL FILTER BLUNT 18X1 1/2 (NEEDLE) ×1 IMPLANT
NDL SAFETY ECLIPSE 18X1.5 (NEEDLE) ×2 IMPLANT
NDL SPNL 18GX3.5 QUINCKE PK (NEEDLE) ×1 IMPLANT
NDL SUT 6 .5 CRC .975X.05 MAYO (NEEDLE) IMPLANT
NEEDLE 1/2 CIR CATGUT .05X1.09 (NEEDLE) IMPLANT
NEEDLE FILTER BLUNT 18X 1/2SAF (NEEDLE) ×1
NEEDLE FILTER BLUNT 18X1 1/2 (NEEDLE) ×2 IMPLANT
NEEDLE HYPO 18GX1.5 SHARP (NEEDLE) ×3
NEEDLE MAYO TAPER (NEEDLE)
NEEDLE SPNL 18GX3.5 QUINCKE PK (NEEDLE) ×3 IMPLANT
NS IRRIG 500ML POUR BTL (IV SOLUTION) IMPLANT
PACK BASIN DAY SURGERY FS (CUSTOM PROCEDURE TRAY) ×3 IMPLANT
PACK SHOULDER CUSTOM OPM052 (CUSTOM PROCEDURE TRAY) ×3 IMPLANT
PAD CLEANER CAUTERY TIP 5X5 (MISCELLANEOUS)
SET ARTHROSCOPY TUBING (MISCELLANEOUS) ×3
SET ARTHROSCOPY TUBING LN (MISCELLANEOUS) ×2 IMPLANT
SLING ARM FOAM STRAP LRG (SOFTGOODS) IMPLANT
SLING ARM LGE (CAST SUPPLIES) ×2 IMPLANT
SLING ULTRA II LARGE (SOFTGOODS) IMPLANT
SPONGE GAUZE 4X4 12PLY (GAUZE/BANDAGES/DRESSINGS) ×2 IMPLANT
STAPLER VISISTAT 35W (STAPLE) IMPLANT
STRIP CLOSURE SKIN 1/2X4 (GAUZE/BANDAGES/DRESSINGS) IMPLANT
SUT BONE WAX W31G (SUTURE) IMPLANT
SUT ETHIBOND 1 OS 2 18 CR3 (SUTURE) IMPLANT
SUT ETHIBOND 2 OS 4 DA (SUTURE) IMPLANT
SUT ETHILON 4 0 PS 2 18 (SUTURE) ×3 IMPLANT
SUT FIBERWIRE #2 38 REV NDL BL (SUTURE)
SUT MON AB 4-0 PC3 18 (SUTURE) IMPLANT
SUT PDS AB 0 CT 36 (SUTURE) IMPLANT
SUT PROLENE 3 0 PS 2 (SUTURE) IMPLANT
SUT VIC AB 1 CT1 36 (SUTURE) IMPLANT
SUT VIC AB 1-0 CT2 27 (SUTURE) IMPLANT
SUT VIC AB 2-0 CT2 27 (SUTURE) ×3 IMPLANT
SUT VICRYL 0 UR6 27IN ABS (SUTURE) ×6 IMPLANT
SUT VICRYL 0-0 OS 2 NEEDLE (SUTURE) IMPLANT
SUTURE FIBERWR#2 38 REV NDL BL (SUTURE) IMPLANT
SYRINGE 10CC LL (SYRINGE) ×3 IMPLANT
TAPE HYPAFIX 6X30 (GAUZE/BANDAGES/DRESSINGS) ×3 IMPLANT
TOWEL OR 17X24 6PK STRL BLUE (TOWEL DISPOSABLE) ×6 IMPLANT
WAND 90 DEG TURBOVAC W/CORD (SURGICAL WAND) ×2 IMPLANT
WATER STERILE IRR 500ML POUR (IV SOLUTION) ×3 IMPLANT

## 2011-10-12 NOTE — Progress Notes (Signed)
Procedural time out for left interscalene block with Dr Council Mechanic and Judie Petit. Dekker Verga RN at bedside.  Pt in position for block with monitors applied

## 2011-10-12 NOTE — Anesthesia Procedure Notes (Addendum)
Anesthesia Regional Block:  Interscalene brachial plexus block  Pre-Anesthetic Checklist: ,, timeout performed, Correct Patient, Correct Site, Correct Laterality, Correct Procedure, Correct Position, site marked, Risks and benefits discussed,  Surgical consent,  Pre-op evaluation,  At surgeon's request and post-op pain management  Laterality: Left  Prep: chloraprep       Needles:  Injection technique: Single-shot  Needle Type: Stimiplex          Additional Needles:  Procedures: ultrasound guided and nerve stimulator Interscalene brachial plexus block  Nerve Stimulator or Paresthesia:  Response: 0.5 mA,   Additional Responses:   Narrative:  Injection made incrementally with aspirations every 5 mL.  Performed by: Personally  Anesthesiologist: Denenny  Additional Notes: Risks, benefits and alternative to block explained extensively.  Patient tolerated procedure well, without complications.  No pain on injection. No increased resistance to injection.  Motor intact immediately after block. Loss of deltoid function at 20 minutes.     Supraclavicular block Procedure Name: Intubation Date/Time: 10/12/2011 1:47 PM Performed by: Huel Coventry Pre-anesthesia Checklist: Patient identified, Emergency Drugs available, Suction available and Patient being monitored Patient Re-evaluated:Patient Re-evaluated prior to inductionOxygen Delivery Method: Circle System Utilized Preoxygenation: Pre-oxygenation with 100% oxygen Intubation Type: IV induction Ventilation: Mask ventilation without difficulty Laryngoscope Size: Mac and 3 Grade View: Grade I Tube type: Oral Tube size: 7.0 mm Number of attempts: 1 Airway Equipment and Method: stylet and LTA Placement Confirmation: ETT inserted through vocal cords under direct vision,  positive ETCO2 and breath sounds checked- equal and bilateral Secured at: 20 cm Tube secured with: Tape Dental Injury: Teeth and Oropharynx as per  pre-operative assessment  Comments: 4 ml 4% Lidocaine via LTA with intubation.

## 2011-10-12 NOTE — Transfer of Care (Signed)
Immediate Anesthesia Transfer of Care Note  Patient: [name]  Procedure(s) Performed:  EXAM UNDER ANESTHESIA WITH MANIPULATION OF SHOULDER; SHOULDER ARTHROSCOPY WITH SUBACROMIAL DECOMPRESSION - labrial debridement  Patient Location: PACU  Anesthesia Type: General  Level of Consciousness: awake, alert  and oriented  Airway & Oxygen Therapy: Patient Spontanous Breathing and Patient connected to face mask oxygen  Post-op Assessment: Report given to PACU RN and Post -op Vital signs reviewed and stable  Post vital signs: Reviewed and stable  Complications: No apparent anesthesia complications  Post vital signs Filed Vitals:   10/12/11 1450  BP: 133/77  Pulse: 111  Temp:   Resp: 24

## 2011-10-12 NOTE — Brief Op Note (Signed)
10/12/2011  2:31 PM  PATIENT:  [name]  57 y.o. female  PRE-OPERATIVE DIAGNOSIS:  adhesive capsulitis left shoulder  POST-OPERATIVE DIAGNOSIS:  adhesive capsulitis left shoulder  PROCEDURE:  Procedure(s): EXAM UNDER ANESTHESIA WITH MANIPULATION OF SHOULDER SHOULDER ARTHROSCOPY WITH SUBACROMIAL DECOMPRESSION  SURGEON:  Surgeon(s): [employer]  PHYSICIAN ASSISTANT:   ASSISTANTS:[employer]  ANESTHESIA:   general  EBL:  Total I/O In: 300 [I.V.:300] Out: -   BLOOD ADMINISTERED:none  DRAINS: none   LOCAL MEDICATIONS USED:  MARCAINE 20CC  SPECIMEN:  No Specimen  DISPOSITION OF SPECIMEN:  N/A  COUNTS:  YES  TOURNIQUET:  * No tourniquets in log *  DICTATION: .Other Dictation: Dictation Number [id number]  PLAN OF CARE: Discharge to home after PACU  PATIENT DISPOSITION:  PACU - hemodynamically stable.   Delay start of Pharmacological VTE agent (>24hrs) due to surgical blood loss or risk of bleeding:  {YES/NO/NOT APPLICABLE:20182

## 2011-10-12 NOTE — Anesthesia Preprocedure Evaluation (Addendum)
Anesthesia Evaluation  Patient identified by MRN, date of birth, ID band Patient awake    Reviewed: Allergy & Precautions, H&P , NPO status , Patient's Chart, lab work & pertinent test results  Airway Mallampati: II TM Distance: >3 FB Neck ROM: Full    Dental No notable dental hx.    Pulmonary neg pulmonary ROS,  clear to auscultation  Pulmonary exam normal       Cardiovascular neg cardio ROS Regular Normal    Neuro/Psych  Neuromuscular disease Negative Neurological ROS  Negative Psych ROS   GI/Hepatic negative GI ROS, Neg liver ROS, hiatal hernia, GERD-  Medicated and Controlled,  Endo/Other  Negative Endocrine ROSHypothyroidism   Renal/GU negative Renal ROS  Genitourinary negative   Musculoskeletal negative musculoskeletal ROS (+)   Abdominal   Peds negative pediatric ROS (+)  Hematology negative hematology ROS (+)   Anesthesia Other Findings   Reproductive/Obstetrics negative OB ROS                           Anesthesia Physical Anesthesia Plan  ASA: II  Anesthesia Plan: General   Post-op Pain Management:    Induction: Intravenous  Airway Management Planned: Oral ETT  Additional Equipment:   Intra-op Plan:   Post-operative Plan: Extubation in OR  Informed Consent: I have reviewed the patients History and Physical, chart, labs and discussed the procedure including the risks, benefits and alternatives for the proposed anesthesia with the patient or authorized representative who has indicated his/her understanding and acceptance.   Dental advisory given  Plan Discussed with: CRNA  Anesthesia Plan Comments: (Dr. Shelle Iron requests interscalene block. Pt. Consents.)       Anesthesia Quick Evaluation

## 2011-10-12 NOTE — H&P (Signed)
[name] is an 56 y.o. female.   Chief Complaint: left shoulder pain HPI: Refractory pain left shoulder  Past Medical History  Diagnosis Date  . Hypothyroidism   . Hiatal hernia   . Barrett's esophagus without dysplasia   . GERD (gastroesophageal reflux disease)   . Hyperlipidemia   . Multinodular goiter   . Fundic gland polyps of stomach, benign   . Internal hemorrhoids   . Thyroid cancer s/p total thyroidectomy--  papillary  . Adhesive capsulitis of left shoulder     Past Surgical History  Procedure Date  . Total thyroidectomy 05-01-2011  . Hemorrhoid surgery   . Laparoscopic salpingoopherectomy 1996    RIGHT OVARIAN CYST  . Appendectomy 01-30-2008  . Laparoscopic tubal ligation 2003    W/   D & E  . Dilation and evacuation 2003    DONE AT TIME OF TUBAL LIGATION  . Transobturator sling 01-07-2005    W/ CYSTOCELE REPAIR    Family History  Problem Relation Age of Onset  . Diabetes Father   . Cancer Father     prostate  . Thyroid disease Mother     hyperparathyroidism  . Thyroid disease Maternal Grandmother    Social History:  reports that she has quit smoking. She has never used smokeless tobacco. She reports that she drinks alcohol. She reports that she does not use illicit drugs.  Allergies:  Allergies  Allergen Reactions  . Celestone (Betamethasone Sodium Phosphate) Other (See Comments)    Feels hot and looks sunburned  . Droperidol Anxiety  . Omnicef Diarrhea    Medications Prior to Admission  Medication Dose Route Frequency Provider Last Rate Last Dose  . 1 mL EPINEPHrine 1 mg/mL (1:1000) in 0.9% Normal Saline 3000 mL irrigation    PRN Javier Docker      . 1 mL EPINEPHrine 1 mg/mL (1:1000) in 0.9% Normal Saline 3000 mL irrigation    PRN Javier Docker      . bupivacaine-EPINEPHrine (MARCAINE W/ EPI) 0.25 % (with pres) injection    PRN Tinnie Gens C Zymier Rodgers   10 mL at 10/12/11 1306  . ceFAZolin (ANCEF) IVPB 1 g/50 mL premix  1 g Intravenous 60 min  Pre-Op Liam Graham, PA      . chlorhexidine (HIBICLENS) 4 % liquid 4 application  60 mL Topical Once Liam Graham, PA      . lactated ringers infusion   Intravenous Continuous Riesa Pope, MD 100 mL/hr at 10/12/11 1155 100 mL/hr at 10/12/11 1155  . sterile water for irrigation    PRN Tinnie Gens C Mase Dhondt   500 mL at 10/12/11 1306   Medications Prior to Admission  Medication Sig Dispense Refill  . aspirin-acetaminophen-caffeine (EXCEDRIN MIGRAINE) 250-250-65 MG per tablet Take 1 tablet by mouth as needed. As needed for migraine      . atorvastatin (LIPITOR) 10 MG tablet Take 10 mg by mouth every evening.      . Calcium Carbonate-Vit D-Min (CALTRATE 600+D PLUS) 600-400 MG-UNIT per tablet Take 1 tablet by mouth daily.       Marland Kitchen esomeprazole (NEXIUM) 40 MG capsule Take 40 mg by mouth daily before breakfast.      . ibuprofen (ADVIL,MOTRIN) 200 MG tablet Take 200 mg by mouth every 6 (six) hours as needed.       Marland Kitchen levothyroxine (SYNTHROID, LEVOTHROID) 137 MCG tablet Take 137 mcg by mouth every morning.       . Cholecalciferol (VITAMIN D3) 50000 UNITS  CAPS Take 1 capsule by mouth as needed. Takes seasonally      . meclizine (ANTIVERT) 25 MG tablet Take 25 mg by mouth as needed.       . pseudoephedrine (SUDAFED) 120 MG 12 hr tablet Take 120 mg by mouth as needed.       . ranitidine (ZANTAC) 150 MG capsule Take 300 mg by mouth as needed.        Results for orders placed during the hospital encounter of 10/12/11 (from the past 48 hour(s))  POCT HEMOGLOBIN-HEMACUE     Status: Normal   Collection Time   10/12/11 11:50 AM      Component Value Range Comment   Hemoglobin 12.6  12.0 - 15.0 (g/dL)    No results found.  Review of Systems  Unable to perform ROS HENT: Negative.   Eyes: Negative.   Respiratory: Negative.   Cardiovascular: Negative.   Gastrointestinal: Negative.   Genitourinary: Negative.   Musculoskeletal: Positive for joint pain.  Skin: Negative.   Neurological: Negative.    Endo/Heme/Allergies: Negative.   Psychiatric/Behavioral: Negative.     Height 5\' 7"  (1.702 m), weight 77.111 kg (170 lb). Physical Exam  Constitutional: She appears well-developed.  HENT:  Head: Normocephalic.  Eyes: Pupils are equal, round, and reactive to light.  Neck: Normal range of motion.  Cardiovascular: Normal rate.   Respiratory: Effort normal.  GI: Soft.  Musculoskeletal: She exhibits tenderness.       Decreased ROM left shoulder. Positive impingement sign. NVI.  Neurological: She is alert.  Skin: Skin is warm.     Assessment/Plan Adhesive capsulitis, impingement left shoulder. Plan MUA EUA LSA SAD  Iyona Pehrson C 10/12/2011, 1:11 PM

## 2011-10-13 NOTE — Anesthesia Postprocedure Evaluation (Signed)
  Anesthesia Post-op Note  Patient: [name]  Procedure(s) Performed:  EXAM UNDER ANESTHESIA WITH MANIPULATION OF SHOULDER; SHOULDER ARTHROSCOPY WITH SUBACROMIAL DECOMPRESSION - labrial debridement  Patient Location: PACU  Anesthesia Type: GA combined with regional for post-op pain  Level of Consciousness: awake and alert   Airway and Oxygen Therapy: Patient Spontanous Breathing  Post-op Pain: none  Post-op Assessment: Post-op Vital signs reviewed, Patient's Cardiovascular Status Stable, Respiratory Function Stable, Patent Airway and No signs of Nausea or vomiting  Post-op Vital Signs: stable  Complications: No apparent anesthesia complications

## 2011-10-13 NOTE — Op Note (Signed)
[name], [name]               ACCOUNT NO.:  [id number]  MEDICAL RECORD NO.:  [id number]  LOCATION:                               FACILITY:  [facility]  PHYSICIAN:  Jene Every, M.D.    DATE OF BIRTH:  Jul 31, 1956  DATE OF PROCEDURE:  10/12/2011 DATE OF DISCHARGE:                              OPERATIVE REPORT   PREOPERATIVE DIAGNOSES:  Adhesive capsulitis, impingement syndrome of the left shoulder.  POSTOPERATIVE DIAGNOSES:  Adhesive capsulitis, impingement syndrome of the left shoulder, anterior superior labral tear.  PROCEDURE PERFORMED: 1. Exam under anesthesia followed by manipulation under anesthesia. 2. Left shoulder arthroscopy with debridement of anterior superior     labrum. 3. Subacromial decompression acromioplasty, release CA ligament. 4. Bursectomy.  ANESTHESIA:  General.  ASSISTANT:  Roma Schanz, PA, utilized for retraction of the upper extremity and controlling the arthroscopic fluid.  HISTORY:  A 56 year old with 2 years of refractory shoulder pain, secondary to impingement syndrome, adhesive capsulitis, fracture, rest, activity modification, therapy corticosteroid injections, indicated for manipulation and shoulder arthroscopy with subacromial decompression, possible open rotator cuff repair.  Risk and benefits discussed including bleeding, infection, damage to vascular structures, no change in symptoms, worsening symptoms, need for repeat debridement, DVT, PE, anesthetic complications, etc.  TECHNIQUE:  With the patient in supine beach-chair position, after induction of adequate regional block and general, the left upper extremity was prepped and draped in usual sterile fashion.  Just prior to that, we manipulated the shoulder under anesthesia in the supine position.  With the scapulothoracic region stabilized, I improved her forward flexion from 100 degrees to 160 degrees with lysis of adhesions appreciated.  With slow gentle manipulative maneuver,  I then manipulated abduction from 60 degrees to 120, again stabilizing the scapulothoracic region.  She interestingly had good external rotation, internal rotation.  We improved with internal rotation maneuver, simultaneously anteriorly and posteriorly translating the humerus slightly.  Again, the scapulothoracic region was stabilized.  We gained full range of motion of the shoulder after the manipulation, therefore placed her in the upright beach-chair position.  Again, we prepped and draped her in usual sterile fashion.  Surgical marker utilized on the acromion AC joint coracoid.  Standard posterolateral and anterolateral portals were utilized with incision through the skin only.  With the arm in the 70-30 position and gentle traction applied, we advanced the arthroscopic camera in the glenohumeral joint, penetrating it atraumatically.  Noted was a tearing of the labrum anteriorly and superiorly with hemorrhagic debris.  I fashioned in anterior portal with an 18-gauge needle, just beneath the biceps between the coracoid and the anterolateral aspect of the acromion.  I fashioned with #11 blade and advanced the arthroscopic cannula into the glenohumeral joint.  I introduced a shaver and debrided the anterior and superior labrum to a stable base.  Evacuated hemorrhagic debris.  The subscap was unremarkable as was the rotator cuff tendon and the biceps tendon was located in its groove.  Subscap was unremarkable as well.  No significant chondral arthritic changes. Therefore, after copious lavage, re-directed the camera in the subacromial space.  Again, we used 65 mmHg and translated the portal to an anterolateral portal after an  incision through the skin only, triangulating noted was exuberant hypertrophic bursa in the subacromial space.  This was then debrided fully anteriorly, posteriorly, and laterally with a shaver.  The rotator cuff appeared to be intact.  There was some decreased  acromial humeral distance.  I released a good portion of the CA ligament.  There was no significant spurring on the anterolateral aspect of the acromion, however.  Full examination of the cuff and palpation revealed no evidence of tear.  Therefore, after lavage, removed all instrumentation.  Portals were closed with 4-0 nylon simple sutures.  0.25% Marcaine with epinephrine was infiltrated in the joint.  Wound was dressed sterilely.  Awoken without difficulty and transported to Recovery in satisfactory condition after placement of a sling.  The patient tolerated the procedure well.  No complications. Minimal blood loss.     Jene Every, M.D.     Cordelia Pen  D:  10/12/2011  T:  10/13/2011  Job:  [id number]

## 2011-10-23 ENCOUNTER — Encounter (INDEPENDENT_AMBULATORY_CARE_PROVIDER_SITE_OTHER): Payer: Self-pay | Admitting: Surgery

## 2011-10-23 ENCOUNTER — Ambulatory Visit (INDEPENDENT_AMBULATORY_CARE_PROVIDER_SITE_OTHER): Payer: BC Managed Care – PPO | Admitting: Surgery

## 2011-10-23 VITALS — BP 94/66 | HR 76 | Temp 97.2°F | Resp 12 | Ht 67.0 in | Wt 176.6 lb

## 2011-10-23 DIAGNOSIS — C73 Malignant neoplasm of thyroid gland: Secondary | ICD-10-CM

## 2011-10-23 NOTE — Patient Instructions (Signed)
  COCOA BUTTER & VITAMIN E CREAM  ([employer] or other brand)  Apply cocoa butter/vitamin E cream to your incision 2 - 3 times daily.  Massage cream into incision for one minute with each application.  Use sunscreen (50 SPF or higher) for first 6 months after surgery if area is exposed to sun.  You may substitute Mederma or other scar reducing creams as desired.   

## 2011-10-23 NOTE — Progress Notes (Signed)
Visit Diagnoses: 1. Thyroid cancer    HISTORY: Patient is a 56 year old white female who underwent thyroidectomy and August 2012 for thyroid carcinoma. She subsequently received radioactive iodine ablation in September 2012. She is now taking thyroid hormone replacement with Synthroid 137 mcg daily under the direction of her endocrinologist. Recent laboratory studies show her thyroglobulin level to be undetectable. She returns today for scheduled followup.  PERTINENT REVIEW OF SYSTEMS: Patient denies any new nodules or lesions. She denies pain. She denies dysphagia. She has noted some change in the quality of her singing voice. Her conversational voice remains normal.  EXAM: HEENT: normocephalic; pupils equal and reactive; sclerae clear; dentition good; mucous membranes moist NECK:  No palpable nodules or masses; incision well healed with good cosmesis; symmetric on extension; no palpable anterior or posterior cervical lymphadenopathy; no supraclavicular masses; no tenderness CHEST: clear to auscultation bilaterally without rales, rhonchi, or wheezes CARDIAC: regular rate and rhythm without significant murmur; peripheral pulses are full EXT:  non-tender without edema; no deformity NEURO: no gross focal deficits; no sign of tremor   IMPRESSION: Thyroid carcinoma, no evidence of recurrent disease  PLAN: Patient will continue close follow up with her endocrinologist. She will likely require a total body iodine scan in the late summer of 2013. Patient will return to see me for her scheduled followup in approximately 6 months.  Velora Heckler, MD, FACS General & Endocrine Surgery [facility]

## 2011-11-02 ENCOUNTER — Encounter (INDEPENDENT_AMBULATORY_CARE_PROVIDER_SITE_OTHER): Payer: Self-pay

## 2012-02-08 ENCOUNTER — Ambulatory Visit: Payer: BC Managed Care – PPO | Admitting: Family Medicine

## 2012-03-01 ENCOUNTER — Other Ambulatory Visit: Payer: Self-pay | Admitting: Endocrinology

## 2012-03-01 DIAGNOSIS — C73 Malignant neoplasm of thyroid gland: Secondary | ICD-10-CM

## 2012-03-02 ENCOUNTER — Other Ambulatory Visit: Payer: Self-pay | Admitting: *Deleted

## 2012-03-02 ENCOUNTER — Ambulatory Visit (INDEPENDENT_AMBULATORY_CARE_PROVIDER_SITE_OTHER): Payer: BC Managed Care – PPO | Admitting: Family Medicine

## 2012-03-02 ENCOUNTER — Encounter: Payer: Self-pay | Admitting: Family Medicine

## 2012-03-02 VITALS — BP 110/62 | HR 72 | Temp 98.0°F | Resp 12 | Ht 66.0 in | Wt 174.0 lb

## 2012-03-02 DIAGNOSIS — C73 Malignant neoplasm of thyroid gland: Secondary | ICD-10-CM

## 2012-03-02 DIAGNOSIS — E785 Hyperlipidemia, unspecified: Secondary | ICD-10-CM

## 2012-03-02 DIAGNOSIS — K219 Gastro-esophageal reflux disease without esophagitis: Secondary | ICD-10-CM

## 2012-03-02 LAB — HEPATIC FUNCTION PANEL
ALT: 16 U/L (ref 0–35)
AST: 21 U/L (ref 0–37)
Albumin: 4.2 g/dL (ref 3.5–5.2)
Alkaline Phosphatase: 98 U/L (ref 39–117)
Bilirubin, Direct: 0.1 mg/dL (ref 0.0–0.3)
Total Bilirubin: 0.5 mg/dL (ref 0.3–1.2)
Total Protein: 7 g/dL (ref 6.0–8.3)

## 2012-03-02 LAB — LIPID PANEL
Cholesterol: 143 mg/dL (ref 0–200)
HDL: 55 mg/dL (ref >39.00)
LDL Cholesterol: 79 mg/dL (ref 0–99)
Total CHOL/HDL Ratio: 3
Triglycerides: 47 mg/dL (ref 0.0–149.0)
VLDL: 9.4 mg/dL (ref 0.0–40.0)

## 2012-03-02 MED ORDER — ATORVASTATIN CALCIUM 10 MG PO TABS: 10.0000 mg | ORAL_TABLET | Freq: Every evening | ORAL | Status: DC

## 2012-03-02 NOTE — Progress Notes (Signed)
Quick Note:  Pt informed, future labs ordered ______ 

## 2012-03-02 NOTE — Progress Notes (Signed)
  Subjective:    Patient ID: [name], female    DOB: Apr 03, 1956, 56 y.o.   MRN: [id number]  HPI  Patient is seen to establish care with me. Previous patient of Dr. Scotty Court. She has history of thyroid cancer which was diagnosed last year with surgery last summer (total thyroidectomy). Followed closely by endocrinologist. Is now hypothyroid on replacement with recent adjustment in dosage. She continues to see gynecologist regularly for physicals. Other medical problems include history of hyperlipidemia, GERD, benign colon polyps. Patient needs refills of Lipitor. Lipids have not been checked in over a year. She takes low-dose Nexium 20 mg daily for reflux. GERD symptoms stable. Prior history of Barrett's esophagus. Had recent EGD with apparently no Barrett's changes.  Patient's husband has metastatic renal cell cancer and she's dealing with stresses of his treatment.  Past Medical History  Diagnosis Date  . Hypothyroidism   . Hiatal hernia   . Barrett's esophagus without dysplasia   . GERD (gastroesophageal reflux disease)   . Hyperlipidemia   . Multinodular goiter   . Fundic gland polyps of stomach, benign   . Internal hemorrhoids   . Thyroid cancer s/p total thyroidectomy--  papillary  . Adhesive capsulitis of left shoulder    Past Surgical History  Procedure Date  . Total thyroidectomy 05-01-2011  . Hemorrhoid surgery   . Laparoscopic salpingoopherectomy 1996    RIGHT OVARIAN CYST  . Appendectomy 01-30-2008  . Laparoscopic tubal ligation 2003    W/   D & E  . Dilation and evacuation 2003    DONE AT TIME OF TUBAL LIGATION  . Transobturator sling 01-07-2005    W/ CYSTOCELE REPAIR    reports that she has quit smoking. She has never used smokeless tobacco. She reports that she drinks alcohol. She reports that she does not use illicit drugs. family history includes Cancer in her father; Diabetes in her father; and Thyroid disease in her maternal grandmother and  mother. Allergies  Allergen Reactions  . Celestone (Betamethasone Sodium Phosphate) Other (See Comments)    Feels hot and looks sunburned  . Droperidol Anxiety  . Omni-Pac Diarrhea      Review of Systems  Constitutional: Negative for fever, chills, appetite change and unexpected weight change.  Respiratory: Negative for cough and shortness of breath.   Cardiovascular: Negative for chest pain.  Gastrointestinal: Negative for nausea, vomiting and abdominal pain.  Genitourinary: Negative for dysuria.  Neurological: Negative for weakness and headaches.  Hematological: Negative for adenopathy.  Psychiatric/Behavioral: Negative for dysphoric mood.       Objective:   Physical Exam  Constitutional: She is oriented to person, place, and time. She appears well-developed and well-nourished.  Neck:       Horizontal scar lower neck from previous thyroidectomy. She has some scar tissue palpable no other masses.  Cardiovascular: Normal rate and regular rhythm.   No murmur heard. Pulmonary/Chest: Effort normal and breath sounds normal. No respiratory distress. She has no wheezes. She has no rales.  Musculoskeletal: She exhibits no edema.  Neurological: She is alert and oriented to person, place, and time. No cranial nerve deficit.          Assessment & Plan:  #1 history of thyroid cancer now hypothyroid status post thyroidectomy. Continue followup with endocrinology #2 hyperlipidemia. Recheck lipid and hepatic panel. Refill Lipitor #3 history of GERD. Symptomatically stable on Nexium

## 2012-03-24 ENCOUNTER — Other Ambulatory Visit: Payer: Self-pay | Admitting: Obstetrics and Gynecology

## 2012-03-24 DIAGNOSIS — Z1231 Encounter for screening mammogram for malignant neoplasm of breast: Secondary | ICD-10-CM

## 2012-04-14 ENCOUNTER — Ambulatory Visit
Admission: RE | Admit: 2012-04-14 | Discharge: 2012-04-14 | Disposition: A | Discharge status: Home / Self Care | Payer: BC Managed Care – PPO | Source: Ambulatory Visit | Attending: Obstetrics and Gynecology | Admitting: Obstetrics and Gynecology

## 2012-04-14 DIAGNOSIS — Z1231 Encounter for screening mammogram for malignant neoplasm of breast: Secondary | ICD-10-CM

## 2012-06-06 ENCOUNTER — Encounter (HOSPITAL_COMMUNITY)
Admission: RE | Admit: 2012-06-06 | Discharge: 2012-06-06 | Disposition: A | Discharge status: Home / Self Care | Payer: BC Managed Care – PPO | Source: Ambulatory Visit | Attending: Endocrinology | Admitting: Endocrinology

## 2012-06-06 DIAGNOSIS — C73 Malignant neoplasm of thyroid gland: Secondary | ICD-10-CM | POA: Insufficient documentation

## 2012-06-06 MED ORDER — THYROTROPIN ALFA 1.1 MG IM SOLR
0.9000 mg | INTRAMUSCULAR | Status: AC
  Administered 2012-06-06: 0.9 mg via INTRAMUSCULAR
  Filled 2012-06-06: qty 0.9

## 2012-06-07 ENCOUNTER — Encounter (HOSPITAL_COMMUNITY): Payer: Self-pay

## 2012-06-07 ENCOUNTER — Ambulatory Visit (HOSPITAL_COMMUNITY)
Admission: RE | Admit: 2012-06-07 | Discharge: 2012-06-07 | Disposition: A | Discharge status: Home / Self Care | Payer: BC Managed Care – PPO | Source: Ambulatory Visit | Attending: Endocrinology | Admitting: Endocrinology

## 2012-06-07 DIAGNOSIS — C73 Malignant neoplasm of thyroid gland: Secondary | ICD-10-CM | POA: Insufficient documentation

## 2012-06-07 MED ORDER — THYROTROPIN ALFA 1.1 MG IM SOLR
0.9000 mg | INTRAMUSCULAR | Status: AC
  Administered 2012-06-07: 0.9 mg via INTRAMUSCULAR
  Filled 2012-06-07: qty 0.9

## 2012-06-08 ENCOUNTER — Encounter (HOSPITAL_COMMUNITY)
Admission: RE | Admit: 2012-06-08 | Discharge: 2012-06-08 | Disposition: A | Discharge status: Home / Self Care | Payer: BC Managed Care – PPO | Source: Ambulatory Visit | Attending: Endocrinology | Admitting: Endocrinology

## 2012-06-08 DIAGNOSIS — C73 Malignant neoplasm of thyroid gland: Secondary | ICD-10-CM | POA: Insufficient documentation

## 2012-06-10 ENCOUNTER — Encounter (HOSPITAL_COMMUNITY): Payer: Self-pay

## 2012-06-10 ENCOUNTER — Encounter (HOSPITAL_COMMUNITY)
Admission: RE | Admit: 2012-06-10 | Discharge: 2012-06-10 | Disposition: A | Discharge status: Home / Self Care | Payer: BC Managed Care – PPO | Source: Ambulatory Visit | Attending: Endocrinology | Admitting: Endocrinology

## 2012-06-10 MED ORDER — SODIUM IODIDE I 131 CAPSULE: 3.9000 | Freq: Once | INTRAVENOUS | Status: AC | PRN

## 2012-07-21 ENCOUNTER — Telehealth: Payer: Self-pay | Admitting: Gastroenterology

## 2012-07-21 MED ORDER — ESOMEPRAZOLE MAGNESIUM 20 MG PO CPDR: 20.0000 mg | DELAYED_RELEASE_CAPSULE | Freq: Every day | ORAL | Status: DC

## 2012-07-21 NOTE — Telephone Encounter (Signed)
Prescription for Nexium printed and left up front for patient to pick up with samples to get her through until she gets her patient assistance.

## 2012-08-10 ENCOUNTER — Other Ambulatory Visit (INDEPENDENT_AMBULATORY_CARE_PROVIDER_SITE_OTHER): Payer: BC Managed Care – PPO

## 2012-08-10 DIAGNOSIS — E785 Hyperlipidemia, unspecified: Secondary | ICD-10-CM

## 2012-08-10 LAB — LIPID PANEL
Cholesterol: 292 mg/dL — ABNORMAL HIGH (ref 0–200)
HDL: 49.6 mg/dL (ref >39.00)
Total CHOL/HDL Ratio: 6
Triglycerides: 149 mg/dL (ref 0.0–149.0)
VLDL: 29.8 mg/dL (ref 0.0–40.0)

## 2012-08-10 LAB — LDL CHOLESTEROL, DIRECT: Direct LDL: 212.6 mg/dL

## 2012-08-10 LAB — HEPATIC FUNCTION PANEL
ALT: 20 U/L (ref 0–35)
AST: 20 U/L (ref 0–37)
Albumin: 4.3 g/dL (ref 3.5–5.2)
Alkaline Phosphatase: 108 U/L (ref 39–117)
Bilirubin, Direct: 0 mg/dL (ref 0.0–0.3)
Total Bilirubin: 0.5 mg/dL (ref 0.3–1.2)
Total Protein: 7.8 g/dL (ref 6.0–8.3)

## 2012-08-11 NOTE — Progress Notes (Signed)
Quick Note:  Pt informed, mailed to pt home ______

## 2012-09-29 ENCOUNTER — Other Ambulatory Visit: Payer: Self-pay

## 2012-09-29 MED ORDER — ESOMEPRAZOLE MAGNESIUM 20 MG PO CPDR: 20.0000 mg | DELAYED_RELEASE_CAPSULE | Freq: Every day | ORAL | Status: DC

## 2012-10-12 ENCOUNTER — Encounter (INDEPENDENT_AMBULATORY_CARE_PROVIDER_SITE_OTHER): Payer: Self-pay | Admitting: Surgery

## 2012-10-12 ENCOUNTER — Ambulatory Visit (INDEPENDENT_AMBULATORY_CARE_PROVIDER_SITE_OTHER): Payer: BC Managed Care – PPO | Admitting: Surgery

## 2012-10-12 VITALS — BP 110/66 | HR 84 | Temp 97.4°F | Resp 16 | Ht 67.0 in | Wt 183.8 lb

## 2012-10-12 DIAGNOSIS — C73 Malignant neoplasm of thyroid gland: Secondary | ICD-10-CM

## 2012-10-12 NOTE — Patient Instructions (Signed)
Thyroglobulin (Tg) This is a test used to monitor treatment of some types of thyroid cancer and to detect recurrence. Less commonly, may be used to help determine the cause of hyperthyroidism. It is used once treatment for thyroid cancer has been completed, before and after radioactive iodine therapy, and at regular intervals to monitor for recurrence.  PREPARATION FOR TEST A blood sample is obtained by inserting a needle into a vein in the arm. NORMAL FINDINGS  Age: 0-11 months  Female: 0.6-5.5 ng/mL  Female: 0.5-5.5 ng/mL  Age: 1-56 years  Female: 0.6-50.1 ng/mL  Female: 0.5-52.1 ng/mL  Age: 12 years and older  Female: 0.5-53.0 ng/mL  Female: 0.5-43.0 ng/mL Ranges for normal findings may vary among different laboratories and hospitals. You should always check with your doctor after having lab work or other tests done to discuss the meaning of your test results and whether your values are considered within normal limits. MEANING OF TEST  Your caregiver will go over the test results with you and discuss the importance and meaning of your results, as well as treatment options and the need for additional tests if necessary. OBTAINING THE TEST RESULTS  It is your responsibility to obtain your test results. Ask the lab or department performing the test when and how you will get your results. Document Released: 10/17/2004 Document Revised: 12/07/2011 Document Reviewed: 08/26/2008 [employer] Patient Information 2013 [employer].  

## 2012-10-12 NOTE — Progress Notes (Signed)
General Surgery [facility]  Visit Diagnoses: 1. Thyroid cancer     HISTORY: Patient is a 57 year old white female who underwent total thyroidectomy for thyroid carcinoma in 2012. She is currently taking levothyroxine 125 mcg daily under the direction of her endocrinologist. Thyroglobulin levels have remained undetectable. Total body iodine scan in September 2013 showed no activity in the neck or chest.  Patient returns today for scheduled follow-up. She also is concerned over slight asymmetry in the anterior neck and was asked by her endocrinologist to have this evaluated.  PERTINENT REVIEW OF SYSTEMS: Denies tremors. Denies palpitations. Some difficulty sleeping. Concerned about weight gain. No compressive symptoms. No new masses or discomfort.  EXAM: HEENT: normocephalic; pupils equal and reactive; sclerae clear; dentition good; mucous membranes moist NECK:  Surgical incision well healed with good cosmetic result; slight soft tissue asymmetry with slightly prominent skin anterior right neck; no palpable masses in the thyroid bed; symmetric on extension; no palpable anterior or posterior cervical lymphadenopathy; no supraclavicular masses; no tenderness CHEST: clear to auscultation bilaterally without rales, rhonchi, or wheezes CARDIAC: regular rate and rhythm without significant murmur; peripheral pulses are full EXT:  non-tender without edema; no deformity NEURO: no gross focal deficits; no sign of tremor   IMPRESSION: #1 personal history of thyroid carcinoma, no evidence of recurrent disease #2 slight asymmetry to skin and soft tissue above the surgical wound, no sign of neoplasm  PLAN: Patient will continue close follow-up with Dr. Talmage Nap.  She will return for further surgical evaluation as needed.  Velora Heckler, MD, [facility] Office: [phone]

## 2012-10-24 ENCOUNTER — Ambulatory Visit (INDEPENDENT_AMBULATORY_CARE_PROVIDER_SITE_OTHER): Payer: BC Managed Care – PPO | Admitting: Family Medicine

## 2012-10-24 ENCOUNTER — Encounter: Payer: Self-pay | Admitting: Family Medicine

## 2012-10-24 ENCOUNTER — Other Ambulatory Visit: Payer: Self-pay | Admitting: Gastroenterology

## 2012-10-24 VITALS — BP 110/80 | Temp 99.5°F | Wt 184.0 lb

## 2012-10-24 DIAGNOSIS — R059 Cough, unspecified: Secondary | ICD-10-CM

## 2012-10-24 DIAGNOSIS — R05 Cough: Secondary | ICD-10-CM

## 2012-10-24 NOTE — Progress Notes (Signed)
  Subjective:    Patient ID: [name], female    DOB: 1955-11-10, 57 y.o.   MRN: [id number]  HPI Acute visit. Dry cough with onset about 4 days ago. Nonsmoker with no chronic respiratory problems. Minimal nasal congestion past couple of days. No associated sore throat, fever, chills, or body aches. Denies nausea, vomiting, or diarrhea. Increased generalized malaise. No associated dyspnea, hemoptysis, or pleuritic pain  Past Medical History  Diagnosis Date  . Hypothyroidism   . Hiatal hernia   . Barrett's esophagus without dysplasia   . GERD (gastroesophageal reflux disease)   . Hyperlipidemia   . Multinodular goiter   . Fundic gland polyps of stomach, benign   . Internal hemorrhoids   . Thyroid cancer s/p total thyroidectomy--  papillary  . Adhesive capsulitis of left shoulder    Past Surgical History  Procedure Date  . Total thyroidectomy 05-01-2011  . Hemorrhoid surgery   . Laparoscopic salpingoopherectomy 1996    RIGHT OVARIAN CYST  . Appendectomy 01-30-2008  . Laparoscopic tubal ligation 2003    W/   D & E  . Dilation and evacuation 2003    DONE AT TIME OF TUBAL LIGATION  . Transobturator sling 01-07-2005    W/ CYSTOCELE REPAIR    reports that she has quit smoking. She has never used smokeless tobacco. She reports that she drinks alcohol. She reports that she does not use illicit drugs. family history includes Cancer in her father; Diabetes in her father; and Thyroid disease in her maternal grandmother and mother. Allergies  Allergen Reactions  . Celestone (Betamethasone Sodium Phosphate) Other (See Comments)    Feels hot and looks sunburned  . Droperidol Anxiety  . Omni-Pac Diarrhea      Review of Systems  Constitutional: Negative for fever, chills, appetite change and unexpected weight change.  HENT: Positive for congestion. Negative for sore throat.   Respiratory: Positive for cough. Negative for shortness of breath and wheezing.          Objective:   Physical Exam  Constitutional: She appears well-developed and well-nourished. No distress.  HENT:  Right Ear: External ear normal.  Left Ear: External ear normal.  Mouth/Throat: Oropharynx is clear and moist.  Neck: Neck supple.  Cardiovascular: Normal rate and regular rhythm.   Pulmonary/Chest: Effort normal and breath sounds normal. No respiratory distress. She has no wheezes. She has no rales.  Lymphadenopathy:    She has no cervical adenopathy.          Assessment & Plan:  Cough. Suspect acute viral bronchitis. Reassurance. She'll try over-the-counter cough medications such as Delsym.

## 2012-10-24 NOTE — Telephone Encounter (Signed)
KEEP APPT FOR ANY FURTHER REFILLS!! 

## 2012-10-24 NOTE — Patient Instructions (Addendum)

## 2012-11-07 ENCOUNTER — Encounter: Payer: Self-pay | Admitting: Gastroenterology

## 2012-11-07 ENCOUNTER — Ambulatory Visit (INDEPENDENT_AMBULATORY_CARE_PROVIDER_SITE_OTHER): Payer: BC Managed Care – PPO | Admitting: Gastroenterology

## 2012-11-07 VITALS — BP 100/70 | HR 80 | Ht 66.0 in | Wt 184.2 lb

## 2012-11-07 DIAGNOSIS — Z1211 Encounter for screening for malignant neoplasm of colon: Secondary | ICD-10-CM

## 2012-11-07 DIAGNOSIS — K2270 Barrett's esophagus without dysplasia: Secondary | ICD-10-CM

## 2012-11-07 MED ORDER — ESOMEPRAZOLE MAGNESIUM 20 MG PO CPDR: DELAYED_RELEASE_CAPSULE | ORAL | Status: DC

## 2012-11-07 NOTE — Progress Notes (Signed)
History of Present Illness: This is a 57 year old female with GERD and Barrett's esophagus. Her reflux symptoms are under excellent control on Nexium 20 mg daily. She's due for surveillance endoscopy in March 2015. Denies weight loss, abdominal pain, constipation, diarrhea, change in stool caliber, melena, hematochezia, nausea, vomiting, dysphagia, chest pain.  Current Medications, Allergies, Past Medical History, Past Surgical History, Family History and Social History were reviewed in [employer] record.  Physical Exam: General: Well developed , well nourished, no acute distress Head: Normocephalic and atraumatic Eyes:  sclerae anicteric, EOMI Ears: Normal auditory acuity Mouth: No deformity or lesions Lungs: Clear throughout to auscultation Heart: Regular rate and rhythm; no murmurs, rubs or bruits Abdomen: Soft, non tender and non distended. No masses, hepatosplenomegaly or hernias noted. Normal Bowel sounds Musculoskeletal: Symmetrical with no gross deformities  Pulses:  Normal pulses noted Extremities: No clubbing, cyanosis, edema or deformities noted Neurological: Alert oriented x 4, grossly nonfocal Psychological:  Alert and cooperative. Normal mood and affect  Assessment and Recommendations:  1. GERD and Barrett's esophagus. Continue Nexium 20 mg daily and standard antireflux measures. Surveillance endoscopy March 2015.  2. Colorectal cancer screening, average risk. Screening colonoscopy due November 2018.

## 2012-11-07 NOTE — Patient Instructions (Addendum)
We have sent the following medications to your pharmacy for you to pick up at your convenience:Nexium.   Thank you for choosing me and [facility].  Malcolm T. Stark, Jr., MD., FACG   

## 2012-11-08 ENCOUNTER — Telehealth: Payer: Self-pay | Admitting: Gastroenterology

## 2012-11-08 NOTE — Telephone Encounter (Signed)
I have explained to the patient that while the bx report from 12/01/10 did not show Barrett's esophagus,  Dr. Russella Dar documented on his procedure report a 1 cm area of Barrett's.   The bx from 08/08/2007 did confirm Barrett's.  I explained that this is on the history section of her chart and is accurate as a history.  She thanked me for explaining and will call back for any questions or concerns.

## 2012-11-17 ENCOUNTER — Other Ambulatory Visit (INDEPENDENT_AMBULATORY_CARE_PROVIDER_SITE_OTHER): Payer: BC Managed Care – PPO

## 2012-11-17 DIAGNOSIS — E785 Hyperlipidemia, unspecified: Secondary | ICD-10-CM

## 2012-11-17 LAB — LIPID PANEL
Cholesterol: 179 mg/dL (ref 0–200)
HDL: 43.6 mg/dL (ref >39.00)
LDL Cholesterol: 115 mg/dL — ABNORMAL HIGH (ref 0–99)
Total CHOL/HDL Ratio: 4
Triglycerides: 101 mg/dL (ref 0.0–149.0)
VLDL: 20.2 mg/dL (ref 0.0–40.0)

## 2012-11-18 NOTE — Progress Notes (Signed)
Quick Note:  Pt informed on personally identified VM ______ 

## 2013-02-15 ENCOUNTER — Telehealth: Payer: Self-pay | Admitting: Family Medicine

## 2013-02-15 DIAGNOSIS — E785 Hyperlipidemia, unspecified: Secondary | ICD-10-CM

## 2013-02-15 MED ORDER — PRAVASTATIN SODIUM 20 MG PO TABS: 20.0000 mg | ORAL_TABLET | Freq: Every day | ORAL | Status: DC

## 2013-02-15 NOTE — Telephone Encounter (Signed)
Definitely possible.  Stop Lipitor and start Pravastatin 20 mg qhs and repeat lipids and hepatic in 2 months.

## 2013-02-15 NOTE — Telephone Encounter (Signed)
Pt informed, future labs ordered

## 2013-02-15 NOTE — Telephone Encounter (Signed)
Pt decline to wait for CAN. Pt would like nancy to return her call patient is having problems with lipitor  Med causing joint pain.

## 2013-02-15 NOTE — Telephone Encounter (Signed)
Pt experiencing joint stiffness and thinks it is the lipitor.  Pt asking if pravastatin would be a good option for her?

## 2013-05-01 ENCOUNTER — Other Ambulatory Visit: Payer: Self-pay

## 2013-05-01 DIAGNOSIS — Z1231 Encounter for screening mammogram for malignant neoplasm of breast: Secondary | ICD-10-CM

## 2013-05-30 ENCOUNTER — Ambulatory Visit
Admission: RE | Admit: 2013-05-30 | Discharge: 2013-05-30 | Disposition: A | Discharge status: Home / Self Care | Payer: BC Managed Care – PPO | Source: Ambulatory Visit

## 2013-05-30 DIAGNOSIS — Z1231 Encounter for screening mammogram for malignant neoplasm of breast: Secondary | ICD-10-CM

## 2013-06-12 ENCOUNTER — Other Ambulatory Visit: Payer: Self-pay | Admitting: Family Medicine

## 2013-08-11 ENCOUNTER — Telehealth: Payer: Self-pay | Admitting: Family Medicine

## 2013-08-11 NOTE — Telephone Encounter (Signed)
Pt calling back to state Dr. Vance Gather office closes at 1pm on Fridays and she was not able to speak with any one there today.  However, the office should be mailing the results to Dr. Caryl Never and he should get them today or either Monday.  Pt states our office does not received the results by Monday to let her know and she will call the [facility] office so that can fax the results.

## 2013-08-11 NOTE — Telephone Encounter (Signed)
OBGYN office are going to send over lab results.

## 2013-08-11 NOTE — Telephone Encounter (Signed)
Pt's obgyn did cholesterol labs on pt. Finding to be sent from Dr lowe's office along w/ other labs. Pt would like to request that her pravastatin (PRAVACHOL) 20 MG tablet be lowered to 10 mg if md agrees. Pt not able to take the 20mg  regularly due to joint pain all over. Or if dr thinks she should switch meds altogether due to these side effects. pls advise. Pt would also like dr burchette to review all other labs ei: vit d..  History of past depression.

## 2013-08-17 ENCOUNTER — Telehealth: Payer: Self-pay

## 2013-08-17 NOTE — Telephone Encounter (Signed)
Pt sent Lab results over from Dr. Rana Snare office. Per Dr. Caryl Never is to start Vitamin D 1,000 IU daily OTC. And to do a fasting lipids in 3-4 month and get off of pravastatin.  Pt is aware

## 2013-08-21 ENCOUNTER — Encounter: Payer: Self-pay | Admitting: Family Medicine

## 2013-08-21 ENCOUNTER — Ambulatory Visit (INDEPENDENT_AMBULATORY_CARE_PROVIDER_SITE_OTHER): Payer: BC Managed Care – PPO | Admitting: Family Medicine

## 2013-08-21 VITALS — BP 120/80 | HR 87 | Temp 97.7°F | Wt 186.0 lb

## 2013-08-21 DIAGNOSIS — E785 Hyperlipidemia, unspecified: Secondary | ICD-10-CM

## 2013-08-21 DIAGNOSIS — E039 Hypothyroidism, unspecified: Secondary | ICD-10-CM

## 2013-08-21 DIAGNOSIS — IMO0001 Reserved for inherently not codable concepts without codable children: Secondary | ICD-10-CM

## 2013-08-21 DIAGNOSIS — E669 Obesity, unspecified: Secondary | ICD-10-CM

## 2013-08-21 LAB — CBC WITH DIFFERENTIAL/PLATELET
Basophils Absolute: 0 10*3/uL (ref 0.0–0.1)
Basophils Relative: 0.3 % (ref 0.0–3.0)
Eosinophils Absolute: 0.1 10*3/uL (ref 0.0–0.7)
Eosinophils Relative: 2.9 % (ref 0.0–5.0)
HCT: 37 % (ref 36.0–46.0)
Hemoglobin: 12.5 g/dL (ref 12.0–15.0)
Lymphocytes Relative: 31.7 % (ref 12.0–46.0)
Lymphs Abs: 1.3 10*3/uL (ref 0.7–4.0)
MCHC: 33.8 g/dL (ref 30.0–36.0)
MCV: 86.4 fl (ref 78.0–100.0)
Monocytes Absolute: 0.3 10*3/uL (ref 0.1–1.0)
Monocytes Relative: 7.4 % (ref 3.0–12.0)
Neutro Abs: 2.3 10*3/uL (ref 1.4–7.7)
Neutrophils Relative %: 57.7 % (ref 43.0–77.0)
Platelets: 235 10*3/uL (ref 150.0–400.0)
RBC: 4.28 Mil/uL (ref 3.87–5.11)
RDW: 14.6 % (ref 11.5–14.6)
WBC: 4.1 10*3/uL — ABNORMAL LOW (ref 4.5–10.5)

## 2013-08-21 LAB — CK: Total CK: 72 U/L (ref 7–177)

## 2013-08-21 LAB — SEDIMENTATION RATE: Sed Rate: 23 mm/hr — ABNORMAL HIGH (ref 0–22)

## 2013-08-21 MED ORDER — ERGOCALCIFEROL 1.25 MG (50000 UT) PO CAPS: 50000.0000 [IU] | ORAL_CAPSULE | ORAL | Status: DC

## 2013-08-21 NOTE — Progress Notes (Signed)
Subjective:    Patient ID: [name], female    DOB: 11-09-1955, 57 y.o.   MRN: [id number]  HPI Patient is seen with vague symptoms of muscle aches, fairly diffusely including lower back, thighs and to a lesser extent upper extremities.  She brings in copy of recent labs were done from her gynecologist including vitamin D level of 32. Her thyroid functions were normal. Chemistries were all mostly normal. She has slightly elevated lipids. Hemoglobin A1C was 5.8%. She's had weight gain in recent years. She attributes this to lack of dietary compliance, stress, and lack of exercise.  She complains of excessive fatigue. She recently stopped pravastatin thinking this may have been causing some of her muscle aches and this did seem to help briefly but not for long. She's tried massage therapy and chiropractic therapy without improvement. She is requesting to see a nutritionist to help with lifestyle modification.  She has not seen objective evidence for inflammatory arthritis such as warmth, erythema, or visible swelling. No alleviating factors. No associated skin rashes, appetite or weight changes, fever.  Past Medical History  Diagnosis Date  . Hypothyroidism   . Hiatal hernia   . Barrett's esophagus without dysplasia   . GERD (gastroesophageal reflux disease)   . Hyperlipidemia   . Multinodular goiter   . Fundic gland polyps of stomach, benign   . Internal hemorrhoids   . Thyroid cancer s/p total thyroidectomy--  papillary  . Adhesive capsulitis of left shoulder    Past Surgical History  Procedure Laterality Date  . Total thyroidectomy  05-01-2011  . Hemorrhoid surgery    . Laparoscopic salpingoopherectomy  1996    RIGHT OVARIAN CYST  . Appendectomy  01-30-2008  . Laparoscopic tubal ligation  2003    W/   D & E  . Dilation and evacuation  2003    DONE AT TIME OF TUBAL LIGATION  . Transobturator sling  01-07-2005    W/ CYSTOCELE REPAIR    reports that she has quit smoking. She  has never used smokeless tobacco. She reports that she drinks alcohol. She reports that she does not use illicit drugs. family history includes Cancer in her father; Diabetes in her father; Thyroid disease in her maternal grandmother and mother. Allergies  Allergen Reactions  . Celestone [Betamethasone Sodium Phosphate] Other (See Comments)    Feels hot and looks sunburned  . Droperidol Anxiety  . Omni-Pac Diarrhea     Review of Systems  Constitutional: Positive for fatigue. Negative for fever, chills, appetite change and unexpected weight change.  Respiratory: Negative for cough and shortness of breath.   Cardiovascular: Negative for chest pain.  Genitourinary: Negative for dysuria.  Neurological: Negative for dizziness.  Hematological: Negative for adenopathy. Does not bruise/bleed easily.       Objective:   Physical Exam  Constitutional: She appears well-developed and well-nourished.  Neck: Neck supple. No thyromegaly present.  Cardiovascular: Normal rate and regular rhythm.   Pulmonary/Chest: Effort normal and breath sounds normal. No respiratory distress. She has no wheezes. She has no rales.  Musculoskeletal: She exhibits no edema.  No objective evidence for joint inflammation such as erythema, edema, warmth.  Skin: No rash noted.          Assessment & Plan:  Myalgias and fatigue. Recent thyroid function normal. She's also complaining of some arthralgias but more myalgia. Screening labs - CBC, sedimentation rate, ANA, rheumatoid factor, creatinine kinase. We have suggested referral to nutritionist for further counseling regarding weight loss  and healthier lifestyle habits.

## 2013-08-21 NOTE — Progress Notes (Signed)
Pre visit review using our clinic review tool, if applicable. No additional management support is needed unless otherwise documented below in the visit note. 

## 2013-08-21 NOTE — Patient Instructions (Signed)
Myalgia, Adult °Myalgia is the medical term for muscle pain. It is a symptom of many things. Nearly everyone at some time in their life has this. The most common cause for muscle pain is overuse or straining and more so when you are not in shape. Injuries and muscle bruises cause myalgias. Muscle pain without a history of injury can also be caused by a virus. It frequently comes along with the flu. Myalgia not caused by muscle strain can be present in a large number of infectious diseases. Some autoimmune diseases like lupus and fibromyalgia can cause muscle pain. Myalgia may be mild, or severe. °SYMPTOMS  °The symptoms of myalgia are simply muscle pain. Most of the time this is short lived and the pain goes away without treatment. °DIAGNOSIS  °Myalgia is diagnosed by your caregiver by taking your history. This means you tell him when the problems began, what they are, and what has been happening. If this has not been a long term problem, your caregiver may want to watch for a while to see what will happen. If it has been long term, they may want to do additional testing. °TREATMENT  °The treatment depends on what the underlying cause of the muscle pain is. Often anti-inflammatory medications will help. °HOME CARE INSTRUCTIONS °· If the pain in your muscles came from overuse, slow down your activities until the problems go away. °· Myalgia from overuse of a muscle can be treated with alternating hot and cold packs on the muscle affected or with cold for the first couple days. If either heat or cold seems to make things worse, stop their use. °· Apply ice to the sore area for 15-20 minutes, 03-04 times per day, while awake for the first 2 days of muscle soreness, or as directed. Put the ice in a plastic bag and place a towel between the bag of ice and your skin. °· Only take over-the-counter or prescription medicines for pain, discomfort, or fever as directed by your caregiver. °· Regular gentle exercise may help if  you are not active. °· Stretching before strenuous exercise can help lower the risk of myalgia. It is normal when beginning an exercise regimen to feel some muscle pain after exercising. Muscles that have not been used frequently will be sore at first. If the pain is extreme, this may mean injury to a muscle. °SEEK MEDICAL CARE IF: °· You have an increase in muscle pain that is not relieved with medication. °· You begin to run a temperature. °· You develop nausea and vomiting. °· You develop a stiff and painful neck. °· You develop a rash. °· You develop muscle pain after a tick bite. °· You have continued muscle pain while working out even after you are in good condition. °SEEK IMMEDIATE MEDICAL CARE IF: °Any of your problems are getting worse and medications are not helping. °MAKE SURE YOU:  °· Understand these instructions. °· Will watch your condition. °· Will get help right away if you are not doing well or get worse. °Document Released: 08/06/2006 Document Revised: 12/07/2011 Document Reviewed: 10/26/2006 °[employer]® Patient Information ©2014 [employer]. ° °

## 2013-08-22 LAB — RHEUMATOID FACTOR: Rhuematoid fact SerPl-aCnc: <10 IU/mL (ref <=14)

## 2013-08-22 LAB — ANA: Anti Nuclear Antibody(ANA): NEGATIVE

## 2013-10-04 ENCOUNTER — Ambulatory Visit (INDEPENDENT_AMBULATORY_CARE_PROVIDER_SITE_OTHER): Payer: BC Managed Care – PPO | Admitting: Family Medicine

## 2013-10-04 ENCOUNTER — Encounter: Payer: Self-pay | Admitting: Family Medicine

## 2013-10-04 VITALS — BP 139/76 | HR 87 | Temp 98.7°F | Resp 18 | Ht 67.0 in | Wt 185.0 lb

## 2013-10-04 DIAGNOSIS — J069 Acute upper respiratory infection, unspecified: Secondary | ICD-10-CM

## 2013-10-04 NOTE — Progress Notes (Signed)
Pre visit review using our clinic review tool, if applicable. No additional management support is needed unless otherwise documented below in the visit note. 

## 2013-10-04 NOTE — Progress Notes (Signed)
OFFICE NOTE  10/04/2013  CC:  Chief Complaint  Patient presents with  . URI  . Headache     HPI: Patient is a 58 y.o. Caucasian female who is here for respiratory complaints. Onset 3 days ago, nasal congestion/runny nose, sneezing, PND, not much coughing. No known fever.  Motrin, tylenol, cold-ez, etc.  No nasal sprays. Ear fullness now.  Otherwise feels a little better today.  Pertinent PMH:  Past Medical History  Diagnosis Date  . Hypothyroidism   . Hiatal hernia   . Barrett's esophagus without dysplasia   . GERD (gastroesophageal reflux disease)   . Hyperlipidemia   . Multinodular goiter   . Fundic gland polyps of stomach, benign   . Internal hemorrhoids   . Thyroid cancer s/p total thyroidectomy--  papillary  . Adhesive capsulitis of left shoulder    Past surgical hx reviewed.  MEDS:  Outpatient Prescriptions Prior to Visit  Medication Sig Dispense Refill  . aspirin-acetaminophen-caffeine (EXCEDRIN MIGRAINE) 250-250-65 MG per tablet Take 1 tablet by mouth as needed. As needed for migraine      . Calcium Carbonate-Vit D-Min (CALTRATE 600+D PLUS) 600-400 MG-UNIT per tablet Take 1 tablet by mouth 2 (two) times daily.       . ergocalciferol (VITAMIN D2) 50000 UNITS capsule Take 1 capsule (50,000 Units total) by mouth once a week.  4 capsule  12  . esomeprazole (NEXIUM) 20 MG capsule TAKE 1 CAPSULE ONCE DAILY BEFORE BREAKFAST.  30 capsule  11  . ibuprofen (ADVIL,MOTRIN) 200 MG tablet Take 200 mg by mouth every 6 (six) hours as needed.       Marland Kitchen levothyroxine (SYNTHROID, LEVOTHROID) 125 MCG tablet Take 125 mcg by mouth daily.      . medroxyPROGESTERone (PROVERA) 10 MG tablet       . pravastatin (PRAVACHOL) 20 MG tablet TAKE 1 TABLET ONCE DAILY.  90 tablet  1   No facility-administered medications prior to visit.    PE: Blood pressure 139/76, pulse 87, temperature 98.7 F (37.1 C), temperature source Temporal, resp. rate 18, height 5\' 7"  (1.702 m), weight 185 lb (83.915  kg), SpO2 96.00%. VS: noted--normal. Gen: alert, NAD, NONTOXIC APPEARING. HEENT: eyes without injection, drainage, or swelling.  Ears: EACs clear, TMs with normal light reflex and landmarks.  Nose: Clear rhinorrhea, with some dried, crusty exudate adherent to mildly injected mucosa.  No purulent d/c.  No paranasal sinus TTP.  No facial swelling.  Throat and mouth without focal lesion.  No pharyngial swelling, erythema, or exudate.   Neck: supple, no LAD.   LUNGS: CTA bilat, nonlabored resps.   CV: RRR, no m/r/g. EXT: no c/c/e SKIN: no rash   IMPRESSION AND PLAN:  Viral URI. Trial of mucinex DM or robitussin DM otc as directed on the box. May use OTC nasal saline spray or irrigation solution bid. OTC nonsedating antihistamines prn discussed.  Decongestant use discussed--ok if tolerated in the past w/out side effect and if pt has no hx of HTN.  An After Visit Summary was printed and given to the patient.  FOLLOW UP: prn

## 2013-10-04 NOTE — Patient Instructions (Signed)
Consider generic allegra D 24h.  Also try saline nasal spray OTC (generic).

## 2013-10-19 ENCOUNTER — Emergency Department (HOSPITAL_COMMUNITY)
Admission: EM | Admit: 2013-10-19 | Discharge: 2013-10-19 | Disposition: A | Discharge status: Home / Self Care | Payer: BC Managed Care – PPO | Attending: Emergency Medicine | Admitting: Emergency Medicine

## 2013-10-19 ENCOUNTER — Encounter (HOSPITAL_COMMUNITY): Payer: Self-pay | Admitting: Emergency Medicine

## 2013-10-19 ENCOUNTER — Telehealth: Payer: Self-pay | Admitting: Family Medicine

## 2013-10-19 DIAGNOSIS — R112 Nausea with vomiting, unspecified: Secondary | ICD-10-CM

## 2013-10-19 DIAGNOSIS — Z79899 Other long term (current) drug therapy: Secondary | ICD-10-CM | POA: Insufficient documentation

## 2013-10-19 DIAGNOSIS — Z87891 Personal history of nicotine dependence: Secondary | ICD-10-CM | POA: Insufficient documentation

## 2013-10-19 DIAGNOSIS — Z85850 Personal history of malignant neoplasm of thyroid: Secondary | ICD-10-CM | POA: Insufficient documentation

## 2013-10-19 DIAGNOSIS — Z860100 Personal history of colon polyps, unspecified: Secondary | ICD-10-CM | POA: Insufficient documentation

## 2013-10-19 DIAGNOSIS — R109 Unspecified abdominal pain: Secondary | ICD-10-CM | POA: Insufficient documentation

## 2013-10-19 DIAGNOSIS — K219 Gastro-esophageal reflux disease without esophagitis: Secondary | ICD-10-CM | POA: Insufficient documentation

## 2013-10-19 DIAGNOSIS — R197 Diarrhea, unspecified: Secondary | ICD-10-CM | POA: Insufficient documentation

## 2013-10-19 DIAGNOSIS — Z8679 Personal history of other diseases of the circulatory system: Secondary | ICD-10-CM | POA: Insufficient documentation

## 2013-10-19 DIAGNOSIS — Z8739 Personal history of other diseases of the musculoskeletal system and connective tissue: Secondary | ICD-10-CM | POA: Insufficient documentation

## 2013-10-19 DIAGNOSIS — E039 Hypothyroidism, unspecified: Secondary | ICD-10-CM | POA: Insufficient documentation

## 2013-10-19 LAB — CBC WITH DIFFERENTIAL/PLATELET
Basophils Absolute: 0 10*3/uL (ref 0.0–0.1)
Basophils Relative: 0 % (ref 0–1)
Eosinophils Absolute: 0 10*3/uL (ref 0.0–0.7)
Eosinophils Relative: 0 % (ref 0–5)
HCT: 41.5 % (ref 36.0–46.0)
Hemoglobin: 14.2 g/dL (ref 12.0–15.0)
Lymphocytes Relative: 3 % — ABNORMAL LOW (ref 12–46)
Lymphs Abs: 0.2 10*3/uL — ABNORMAL LOW (ref 0.7–4.0)
MCH: 29.6 pg (ref 26.0–34.0)
MCHC: 34.2 g/dL (ref 30.0–36.0)
MCV: 86.5 fL (ref 78.0–100.0)
Monocytes Absolute: 0.4 10*3/uL (ref 0.1–1.0)
Monocytes Relative: 4 % (ref 3–12)
Neutro Abs: 8 10*3/uL — ABNORMAL HIGH (ref 1.7–7.7)
Neutrophils Relative %: 93 % — ABNORMAL HIGH (ref 43–77)
Platelets: 272 10*3/uL (ref 150–400)
RBC: 4.8 MIL/uL (ref 3.87–5.11)
RDW: 13.8 % (ref 11.5–15.5)
WBC: 8.6 10*3/uL (ref 4.0–10.5)

## 2013-10-19 LAB — URINALYSIS, ROUTINE W REFLEX MICROSCOPIC
Glucose, UA: NEGATIVE mg/dL
Hgb urine dipstick: NEGATIVE
Ketones, ur: NEGATIVE mg/dL
Leukocytes, UA: NEGATIVE
Nitrite: NEGATIVE
Protein, ur: NEGATIVE mg/dL
Specific Gravity, Urine: 1.036 — ABNORMAL HIGH (ref 1.005–1.030)
Urobilinogen, UA: 0.2 mg/dL (ref 0.0–1.0)
pH: 5 (ref 5.0–8.0)

## 2013-10-19 LAB — COMPREHENSIVE METABOLIC PANEL
ALT: 32 U/L (ref 0–35)
AST: 26 U/L (ref 0–37)
Albumin: 4.1 g/dL (ref 3.5–5.2)
Alkaline Phosphatase: 151 U/L — ABNORMAL HIGH (ref 39–117)
BUN: 22 mg/dL (ref 6–23)
CO2: 23 mEq/L (ref 19–32)
Calcium: 8.7 mg/dL (ref 8.4–10.5)
Chloride: 101 mEq/L (ref 96–112)
Creatinine, Ser: 0.65 mg/dL (ref 0.50–1.10)
GFR calc Af Amer: >90 mL/min (ref >90)
GFR calc non Af Amer: >90 mL/min (ref >90)
Glucose, Bld: 111 mg/dL — ABNORMAL HIGH (ref 70–99)
Potassium: 4 mEq/L (ref 3.7–5.3)
Sodium: 141 mEq/L (ref 137–147)
Total Bilirubin: 0.4 mg/dL (ref 0.3–1.2)
Total Protein: 7.5 g/dL (ref 6.0–8.3)

## 2013-10-19 LAB — LIPASE, BLOOD: Lipase: 26 U/L (ref 11–59)

## 2013-10-19 LAB — URINE MICROSCOPIC-ADD ON

## 2013-10-19 MED ORDER — ONDANSETRON HCL 4 MG/2ML IJ SOLN
4.0000 mg | Freq: Once | INTRAMUSCULAR | Status: AC
  Administered 2013-10-19: 4 mg via INTRAVENOUS
  Filled 2013-10-19: qty 2

## 2013-10-19 MED ORDER — PROMETHAZINE HCL 25 MG RE SUPP: 25.0000 mg | Freq: Four times a day (QID) | RECTAL | Status: DC | PRN

## 2013-10-19 MED ORDER — IBUPROFEN 200 MG PO TABS
600.0000 mg | ORAL_TABLET | Freq: Once | ORAL | Status: AC
  Administered 2013-10-19: 600 mg via ORAL
  Filled 2013-10-19 (×2): qty 1

## 2013-10-19 MED ORDER — SODIUM CHLORIDE 0.9 % IV BOLUS (SEPSIS)
1000.0000 mL | Freq: Once | INTRAVENOUS | Status: AC
  Administered 2013-10-19: 1000 mL via INTRAVENOUS

## 2013-10-19 NOTE — ED Notes (Signed)
Pt wants to wait she gets to a room because she states "I'm very dehydrated so my veins aren't any good"

## 2013-10-19 NOTE — Telephone Encounter (Signed)
Patient Information:  Caller Name: Duard Brady  Phone: [phone]  Patient: [name], [name]  Gender: Female  DOB: 1956-07-27  Age: 58 Years  PCP: Carolann Littler (Family Practice)  Office Follow Up:  Does the office need to follow up with this patient?: No  Instructions For The Office: N/A  RN Note:  Menopausal. Vomiting iwth diarrhea at least 10 times since onset between 0100-0200. Was unable to tolerate husband's Zofran odt due to severe vomiting. Used an expired Phenergan suppository about 1030.Marland Kitchen  Last void at 1040.  Unable to drink since 0200 because vomits if takes a sip.  Last emesis at 1040. Last diarrhea at 0645 with episode of fecal incontinence. Feeling  a little drowsy since Phenergan is working.  Instructed to begin taking 5-10 ml water every 5 minutes now. Discussed ED disposition with office nurse/Montrese per MD protocol when office is open.  Dr Elease Hashimoto ordered go to [facility] ED now.  Agreed to have husband come home and drive her to ED now.  Symptoms  Reason For Call & Symptoms: Severe vomiting with diarrhea since 0100 or 0200 this morning.  Reviewed Health History In EMR: Yes  Reviewed Medications In EMR: Yes  Reviewed Allergies In EMR: Yes  Reviewed Surgeries / Procedures: Yes  Date of Onset of Symptoms: 10/19/2013  Treatments Tried: Zofran odt, Phenergan suppository  Treatments Tried Worked: Yes  Guideline(s) Used:  Vomiting  Disposition Per Guideline:   Go to ED Now  Reason For Disposition Reached:   Severe vomiting (e.g., 6 or more times/day)  Advice Given:  Clear Liquids:  Sip water or a rehydration drink (e.g., Gatorade or Powerade).  After 4 hours without vomiting, increase the amount.  Expected Course:  Vomiting from viral gastritis usually stops in 12 to 48 hours.  If diarrhea is present, it usually lasts for several days.  People with mild dehydration can usually treat themselves at home, by drinking more liquids.  People with moderate to severe  dehydration may need medical care. signs of this include very dry mouth, dizziness, weakness, and decreased urination.  Call Back If:  Vomiting lasts for more than 2 days (48 hours)  Signs of dehydration occur  You become worse.  Patient Will Follow Care Advice:  YES

## 2013-10-19 NOTE — Discharge Instructions (Signed)
Diarrhea °Diarrhea is frequent loose and watery bowel movements. It can cause you to feel weak and dehydrated. Dehydration can cause you to become tired and thirsty, have a dry mouth, and have decreased urination that often is dark yellow. Diarrhea is a sign of another problem, most often an infection that will not last long. In most cases, diarrhea typically lasts 2 3 days. However, it can last longer if it is a sign of something more serious. It is important to treat your diarrhea as directed by your caregive to lessen or prevent future episodes of diarrhea. °CAUSES  °Some common causes include: °· Gastrointestinal infections caused by viruses, bacteria, or parasites. °· Food poisoning or food allergies. °· Certain medicines, such as antibiotics, chemotherapy, and laxatives. °· Artificial sweeteners and fructose. °· Digestive disorders. °HOME CARE INSTRUCTIONS °· Ensure adequate fluid intake (hydration): have 1 cup (8 oz) of fluid for each diarrhea episode. Avoid fluids that contain simple sugars or sports drinks, fruit juices, whole milk products, and sodas. Your urine should be clear or pale yellow if you are drinking enough fluids. Hydrate with an oral rehydration solution that you can purchase at pharmacies, retail stores, and online. You can prepare an oral rehydration solution at home by mixing the following ingredients together: °·   tsp table salt. °· ¾ tsp baking soda. °·  tsp salt substitute containing potassium chloride. °· 1  tablespoons sugar. °· 1 L (34 oz) of water. °· Certain foods and beverages may increase the speed at which food moves through the gastrointestinal (GI) tract. These foods and beverages should be avoided and include: °· Caffeinated and alcoholic beverages. °· High-fiber foods, such as raw fruits and vegetables, nuts, seeds, and whole grain breads and cereals. °· Foods and beverages sweetened with sugar alcohols, such as xylitol, sorbitol, and mannitol. °· Some foods may be well  tolerated and may help thicken stool including: °· Starchy foods, such as rice, toast, pasta, low-sugar cereal, oatmeal, grits, baked potatoes, crackers, and bagels. °· Bananas. °· Applesauce. °· Add probiotic-rich foods to help increase healthy bacteria in the GI tract, such as yogurt and fermented milk products. °· Wash your hands well after each diarrhea episode. °· Only take over-the-counter or prescription medicines as directed by your caregiver. °· Take a warm bath to relieve any burning or pain from frequent diarrhea episodes. °SEEK IMMEDIATE MEDICAL CARE IF:  °· You are unable to keep fluids down. °· You have persistent vomiting. °· You have blood in your stool, or your stools are black and tarry. °· You do not urinate in 6 8 hours, or there is only a small amount of very dark urine. °· You have abdominal pain that increases or localizes. °· You have weakness, dizziness, confusion, or lightheadedness. °· You have a severe headache. °· Your diarrhea gets worse or does not get better. °· You have a fever or persistent symptoms for more than 2 3 days. °· You have a fever and your symptoms suddenly get worse. °MAKE SURE YOU:  °· Understand these instructions. °· Will watch your condition. °· Will get help right away if you are not doing well or get worse. °Document Released: 09/04/2002 Document Revised: 08/31/2012 Document Reviewed: 05/22/2012 °ExitCare® Patient Information ©2014 [employer]. ° °Nausea and Vomiting °Nausea is a sick feeling that often comes before throwing up (vomiting). Vomiting is a reflex where stomach contents come out of your mouth. Vomiting can cause severe loss of body fluids (dehydration). Children and elderly adults can become   dehydrated quickly, especially if they also have diarrhea. Nausea and vomiting are symptoms of a condition or disease. It is important to find the cause of your symptoms. °CAUSES  °· Direct irritation of the stomach lining. This irritation can result from  increased acid production (gastroesophageal reflux disease), infection, food poisoning, taking certain medicines (such as nonsteroidal anti-inflammatory drugs), alcohol use, or tobacco use. °· Signals from the brain. These signals could be caused by a headache, heat exposure, an inner ear disturbance, increased pressure in the brain from injury, infection, a tumor, or a concussion, pain, emotional stimulus, or metabolic problems. °· An obstruction in the gastrointestinal tract (bowel obstruction). °· Illnesses such as diabetes, hepatitis, gallbladder problems, appendicitis, kidney problems, cancer, sepsis, atypical symptoms of a heart attack, or eating disorders. °· Medical treatments such as chemotherapy and radiation. °· Receiving medicine that makes you sleep (general anesthetic) during surgery. °DIAGNOSIS °Your caregiver may ask for tests to be done if the problems do not improve after a few days. Tests may also be done if symptoms are severe or if the reason for the nausea and vomiting is not clear. Tests may include: °· Urine tests. °· Blood tests. °· Stool tests. °· Cultures (to look for evidence of infection). °· X-rays or other imaging studies. °Test results can help your caregiver make decisions about treatment or the need for additional tests. °TREATMENT °You need to stay well hydrated. Drink frequently but in small amounts. You may wish to drink water, sports drinks, clear broth, or eat frozen ice pops or gelatin dessert to help stay hydrated. When you eat, eating slowly may help prevent nausea. There are also some antinausea medicines that may help prevent nausea. °HOME CARE INSTRUCTIONS  °· Take all medicine as directed by your caregiver. °· If you do not have an appetite, do not force yourself to eat. However, you must continue to drink fluids. °· If you have an appetite, eat a normal diet unless your caregiver tells you differently. °· Eat a variety of complex carbohydrates (rice, wheat, potatoes,  bread), lean meats, yogurt, fruits, and vegetables. °· Avoid high-fat foods because they are more difficult to digest. °· Drink enough water and fluids to keep your urine clear or pale yellow. °· If you are dehydrated, ask your caregiver for specific rehydration instructions. Signs of dehydration may include: °· Severe thirst. °· Dry lips and mouth. °· Dizziness. °· Dark urine. °· Decreasing urine frequency and amount. °· Confusion. °· Rapid breathing or pulse. °SEEK IMMEDIATE MEDICAL CARE IF:  °· You have blood or brown flecks (like coffee grounds) in your vomit. °· You have black or bloody stools. °· You have a severe headache or stiff neck. °· You are confused. °· You have severe abdominal pain. °· You have chest pain or trouble breathing. °· You do not urinate at least once every 8 hours. °· You develop cold or clammy skin. °· You continue to vomit for longer than 24 to 48 hours. °· You have a fever. °MAKE SURE YOU:  °· Understand these instructions. °· Will watch your condition. °· Will get help right away if you are not doing well or get worse. °Document Released: 09/14/2005 Document Revised: 12/07/2011 Document Reviewed: 02/11/2011 °ExitCare® Patient Information ©2014 [employer]. ° °

## 2013-10-19 NOTE — ED Provider Notes (Signed)
Medical screening examination/treatment/procedure(s) were performed by non-physician practitioner and as supervising physician I was immediately available for consultation/collaboration.  Richarda Blade, MD 10/19/13 2016

## 2013-10-19 NOTE — Telephone Encounter (Signed)
Noted  

## 2013-10-19 NOTE — ED Notes (Signed)
Pt reports abd pain and n/v/d since 0100. Denies fevers/chills. Took zofran twice pta and phenergan suppository pta.

## 2013-10-19 NOTE — ED Provider Notes (Signed)
CSN: [id number]     Arrival date & time 10/19/13  1333 History   First MD Initiated Contact with Patient 10/19/13 1542     Chief Complaint  Patient presents with  . Abdominal Pain  . Emesis  . Diarrhea   (Consider location/radiation/quality/duration/timing/severity/associated sxs/prior Treatment) Patient is a 58 y.o. female presenting with vomiting and diarrhea. The history is provided by the patient. No language interpreter was used.  Emesis Severity:  Severe Duration:  1 day Associated symptoms: abdominal pain and diarrhea   Associated symptoms: no chills   Associated symptoms comment:  She states she got up during the night with nausea, vomiting and diarrhea that has persisted all day. She has been unable to tolerate any PO fluids or solids. No significant abdominal pain and no fever. She ate a cheese sandwich prior to going to bed and feels this may be contributory. No other sick family members.  Diarrhea Associated symptoms: abdominal pain and vomiting   Associated symptoms: no chills and no fever     Past Medical History  Diagnosis Date  . Hypothyroidism   . Hiatal hernia   . Barrett's esophagus without dysplasia   . GERD (gastroesophageal reflux disease)   . Hyperlipidemia   . Multinodular goiter   . Fundic gland polyps of stomach, benign   . Internal hemorrhoids   . Thyroid cancer s/p total thyroidectomy--  papillary  . Adhesive capsulitis of left shoulder    Past Surgical History  Procedure Laterality Date  . Total thyroidectomy  05-01-2011  . Hemorrhoid surgery    . Laparoscopic salpingoopherectomy  1996    RIGHT OVARIAN CYST  . Appendectomy  01-30-2008  . Laparoscopic tubal ligation  2003    W/   D & E  . Dilation and evacuation  2003    DONE AT TIME OF TUBAL LIGATION  . Transobturator sling  01-07-2005    W/ CYSTOCELE REPAIR   Family History  Problem Relation Age of Onset  . Diabetes Father   . Cancer Father     prostate  . Thyroid disease Mother    hyperparathyroidism  . Thyroid disease Maternal Grandmother    History  Substance Use Topics  . Smoking status: Former Games developer  . Smokeless tobacco: Never Used     Comment: quit 1986  . Alcohol Use: Yes     Comment: rarely   OB History   Grav Para Term Preterm Abortions TAB SAB Ect Mult Living                 Review of Systems  Constitutional: Negative for fever and chills.  Respiratory: Negative.   Cardiovascular: Negative.   Gastrointestinal: Positive for nausea, vomiting, abdominal pain and diarrhea. Negative for blood in stool.  Genitourinary: Negative.   Musculoskeletal: Negative.   Neurological: Negative.     Allergies  Celestone; Droperidol; and Omni-pac  Home Medications   Current Outpatient Rx  Name  Route  Sig  Dispense  Refill  . aspirin-acetaminophen-caffeine (EXCEDRIN MIGRAINE) 250-250-65 MG per tablet   Oral   Take 1 tablet by mouth as needed. As needed for migraine         . Calcium Carbonate-Vit D-Min (CALTRATE 600+D PLUS) 600-400 MG-UNIT per tablet   Oral   Take 1 tablet by mouth 2 (two) times daily.          . ergocalciferol (VITAMIN D2) 50000 UNITS capsule   Oral   Take 1 capsule (50,000 Units total) by mouth once a week.  4 capsule   12   . esomeprazole (NEXIUM) 20 MG capsule      TAKE 1 CAPSULE ONCE DAILY BEFORE BREAKFAST.   30 capsule   11   . ibuprofen (ADVIL,MOTRIN) 200 MG tablet   Oral   Take 200 mg by mouth every 6 (six) hours as needed.          Marland Kitchen levothyroxine (SYNTHROID, LEVOTHROID) 125 MCG tablet   Oral   Take 125 mcg by mouth daily.          BP 114/77  Pulse 111  Temp(Src) 99.3 F (37.4 C) (Oral)  Resp 18  SpO2 95% Physical Exam  Constitutional: She appears well-developed and well-nourished.  HENT:  Head: Normocephalic.  Mouth/Throat: Mucous membranes are dry.  Neck: Normal range of motion. Neck supple.  Cardiovascular: Normal rate and regular rhythm.   Pulmonary/Chest: Effort normal and breath sounds  normal.  Abdominal: Soft. Bowel sounds are normal. There is no tenderness. There is no rebound and no guarding.  Musculoskeletal: Normal range of motion.  Neurological: She is alert. No cranial nerve deficit.  Skin: Skin is warm and dry. No rash noted.  Psychiatric: She has a normal mood and affect.    ED Course  Procedures (including critical care time) Labs Review Labs Reviewed  CBC WITH DIFFERENTIAL - Abnormal; Notable for the following:    Neutrophils Relative % 93 (*)    Neutro Abs 8.0 (*)    Lymphocytes Relative 3 (*)    Lymphs Abs 0.2 (*)    All other components within normal limits  COMPREHENSIVE METABOLIC PANEL - Abnormal; Notable for the following:    Glucose, Bld 111 (*)    Alkaline Phosphatase 151 (*)    All other components within normal limits  LIPASE, BLOOD  URINALYSIS, ROUTINE W REFLEX MICROSCOPIC   Results for orders placed during the hospital encounter of 10/19/13  CBC WITH DIFFERENTIAL      Result Value Range   WBC 8.6  4.0 - 10.5 K/uL   RBC 4.80  3.87 - 5.11 MIL/uL   Hemoglobin 14.2  12.0 - 15.0 g/dL   HCT 41.5  36.0 - 46.0 %   MCV 86.5  78.0 - 100.0 fL   MCH 29.6  26.0 - 34.0 pg   MCHC 34.2  30.0 - 36.0 g/dL   RDW 13.8  11.5 - 15.5 %   Platelets 272  150 - 400 K/uL   Neutrophils Relative % 93 (*) 43 - 77 %   Neutro Abs 8.0 (*) 1.7 - 7.7 K/uL   Lymphocytes Relative 3 (*) 12 - 46 %   Lymphs Abs 0.2 (*) 0.7 - 4.0 K/uL   Monocytes Relative 4  3 - 12 %   Monocytes Absolute 0.4  0.1 - 1.0 K/uL   Eosinophils Relative 0  0 - 5 %   Eosinophils Absolute 0.0  0.0 - 0.7 K/uL   Basophils Relative 0  0 - 1 %   Basophils Absolute 0.0  0.0 - 0.1 K/uL  COMPREHENSIVE METABOLIC PANEL      Result Value Range   Sodium 141  137 - 147 mEq/L   Potassium 4.0  3.7 - 5.3 mEq/L   Chloride 101  96 - 112 mEq/L   CO2 23  19 - 32 mEq/L   Glucose, Bld 111 (*) 70 - 99 mg/dL   BUN 22  6 - 23 mg/dL   Creatinine, Ser 0.65  0.50 - 1.10 mg/dL   Calcium 8.7  8.4 - 10.5 mg/dL    Total Protein 7.5  6.0 - 8.3 g/dL   Albumin 4.1  3.5 - 5.2 g/dL   AST 26  0 - 37 U/L   ALT 32  0 - 35 U/L   Alkaline Phosphatase 151 (*) 39 - 117 U/L   Total Bilirubin 0.4  0.3 - 1.2 mg/dL   GFR calc non Af Amer >90  >90 mL/min   GFR calc Af Amer >90  >90 mL/min  LIPASE, BLOOD      Result Value Range   Lipase 26  11 - 59 U/L    Imaging Review No results found.  EKG Interpretation   None       MDM  No diagnosis found. 1. N, V, D  IV fluids going. She is resting comfortably with nausea resolved. PO challenge with fluids without vomiting. VSS, improved.    Dewaine Oats, PA-C 10/19/13 1928

## 2013-10-24 ENCOUNTER — Encounter: Payer: Self-pay | Admitting: Gastroenterology

## 2013-11-17 ENCOUNTER — Encounter: Payer: Self-pay | Admitting: Gastroenterology

## 2013-12-19 ENCOUNTER — Other Ambulatory Visit: Payer: Self-pay

## 2013-12-19 MED ORDER — ESOMEPRAZOLE MAGNESIUM 20 MG PO CPDR: DELAYED_RELEASE_CAPSULE | ORAL | Status: DC

## 2013-12-26 ENCOUNTER — Encounter: Payer: Self-pay | Admitting: Family Medicine

## 2013-12-26 ENCOUNTER — Ambulatory Visit (INDEPENDENT_AMBULATORY_CARE_PROVIDER_SITE_OTHER): Payer: BC Managed Care – PPO | Admitting: Family Medicine

## 2013-12-26 VITALS — BP 112/80 | HR 83 | Temp 97.6°F | Wt 183.0 lb

## 2013-12-26 DIAGNOSIS — E785 Hyperlipidemia, unspecified: Secondary | ICD-10-CM

## 2013-12-26 LAB — LIPID PANEL
Cholesterol: 267 mg/dL — ABNORMAL HIGH (ref 0–200)
HDL: 52.8 mg/dL (ref >39.00)
LDL Cholesterol: 181 mg/dL — ABNORMAL HIGH (ref 0–99)
Total CHOL/HDL Ratio: 5
Triglycerides: 166 mg/dL — ABNORMAL HIGH (ref 0.0–149.0)
VLDL: 33.2 mg/dL (ref 0.0–40.0)

## 2013-12-26 NOTE — Progress Notes (Signed)
Pre visit review using our clinic review tool, if applicable. No additional management support is needed unless otherwise documented below in the visit note. 

## 2013-12-26 NOTE — Patient Instructions (Signed)
Fat and Cholesterol Control Diet  Fat and cholesterol levels in your blood and organs are influenced by your diet. High levels of fat and cholesterol may lead to diseases of the heart, small and large blood vessels, gallbladder, liver, and pancreas.  CONTROLLING FAT AND CHOLESTEROL WITH DIET  Although exercise and lifestyle factors are important, your diet is key. That is because certain foods are known to raise cholesterol and others to lower it. The goal is to balance foods for their effect on cholesterol and more importantly, to replace saturated and trans fat with other types of fat, such as monounsaturated fat, polyunsaturated fat, and omega-3 fatty acids.  On average, a person should consume no more than 15 to 17 g of saturated fat daily. Saturated and trans fats are considered "bad" fats, and they will raise LDL cholesterol. Saturated fats are primarily found in animal products such as meats, butter, and cream. However, that does not mean you need to give up all your favorite foods. Today, there are good tasting, low-fat, low-cholesterol substitutes for most of the things you like to eat. Choose low-fat or nonfat alternatives. Choose round or loin cuts of red meat. These types of cuts are lowest in fat and cholesterol. Chicken (without the skin), fish, veal, and ground turkey breast are great choices. Eliminate fatty meats, such as hot dogs and salami. Even shellfish have little or no saturated fat. Have a 3 oz (85 g) portion when you eat lean meat, poultry, or fish.  Trans fats are also called "partially hydrogenated oils." They are oils that have been scientifically manipulated so that they are solid at room temperature resulting in a longer shelf life and improved taste and texture of foods in which they are added. Trans fats are found in stick margarine, some tub margarines, cookies, crackers, and baked goods.   When baking and cooking, oils are a great substitute for butter. The monounsaturated oils are  especially beneficial since it is believed they lower LDL and raise HDL. The oils you should avoid entirely are saturated tropical oils, such as coconut and palm.   Remember to eat a lot from food groups that are naturally free of saturated and trans fat, including fish, fruit, vegetables, beans, grains (barley, rice, couscous, bulgur wheat), and pasta (without cream sauces).   IDENTIFYING FOODS THAT LOWER FAT AND CHOLESTEROL   Soluble fiber may lower your cholesterol. This type of fiber is found in fruits such as apples, vegetables such as broccoli, potatoes, and carrots, legumes such as beans, peas, and lentils, and grains such as barley. Foods fortified with plant sterols (phytosterol) may also lower cholesterol. You should eat at least 2 g per day of these foods for a cholesterol lowering effect.   Read package labels to identify low-saturated fats, trans fat free, and low-fat foods at the supermarket. Select cheeses that have only 2 to 3 g saturated fat per ounce. Use a heart-healthy tub margarine that is free of trans fats or partially hydrogenated oil. When buying baked goods (cookies, crackers), avoid partially hydrogenated oils. Breads and muffins should be made from whole grains (whole-wheat or whole oat flour, instead of "flour" or "enriched flour"). Buy non-creamy canned soups with reduced salt and no added fats.   FOOD PREPARATION TECHNIQUES   Never deep-fry. If you must fry, either stir-fry, which uses very little fat, or use non-stick cooking sprays. When possible, broil, bake, or roast meats, and steam vegetables. Instead of putting butter or margarine on vegetables, use lemon   and herbs, applesauce, and cinnamon (for squash and sweet potatoes). Use nonfat yogurt, salsa, and low-fat dressings for salads.   LOW-SATURATED FAT / LOW-FAT FOOD SUBSTITUTES  Meats / Saturated Fat (g)  · Avoid: Steak, marbled (3 oz/85 g) / 11 g  · Choose: Steak, lean (3 oz/85 g) / 4 g  · Avoid: Hamburger (3 oz/85 g) / 7  g  · Choose: Hamburger, lean (3 oz/85 g) / 5 g  · Avoid: Ham (3 oz/85 g) / 6 g  · Choose: Ham, lean cut (3 oz/85 g) / 2.4 g  · Avoid: Chicken, with skin, dark meat (3 oz/85 g) / 4 g  · Choose: Chicken, skin removed, dark meat (3 oz/85 g) / 2 g  · Avoid: Chicken, with skin, light meat (3 oz/85 g) / 2.5 g  · Choose: Chicken, skin removed, light meat (3 oz/85 g) / 1 g  Dairy / Saturated Fat (g)  · Avoid: Whole milk (1 cup) / 5 g  · Choose: Low-fat milk, 2% (1 cup) / 3 g  · Choose: Low-fat milk, 1% (1 cup) / 1.5 g  · Choose: Skim milk (1 cup) / 0.3 g  · Avoid: Hard cheese (1 oz/28 g) / 6 g  · Choose: Skim milk cheese (1 oz/28 g) / 2 to 3 g  · Avoid: Cottage cheese, 4% fat (1 cup) / 6.5 g  · Choose: Low-fat cottage cheese, 1% fat (1 cup) / 1.5 g  · Avoid: Ice cream (1 cup) / 9 g  · Choose: Sherbet (1 cup) / 2.5 g  · Choose: Nonfat frozen yogurt (1 cup) / 0.3 g  · Choose: Frozen fruit bar / trace  · Avoid: Whipped cream (1 tbs) / 3.5 g  · Choose: Nondairy whipped topping (1 tbs) / 1 g  Condiments / Saturated Fat (g)  · Avoid: Mayonnaise (1 tbs) / 2 g  · Choose: Low-fat mayonnaise (1 tbs) / 1 g  · Avoid: Butter (1 tbs) / 7 g  · Choose: Extra light margarine (1 tbs) / 1 g  · Avoid: Coconut oil (1 tbs) / 11.8 g  · Choose: Olive oil (1 tbs) / 1.8 g  · Choose: Corn oil (1 tbs) / 1.7 g  · Choose: Safflower oil (1 tbs) / 1.2 g  · Choose: Sunflower oil (1 tbs) / 1.4 g  · Choose: Soybean oil (1 tbs) / 2.4 g  · Choose: Canola oil (1 tbs) / 1 g  Document Released: 09/14/2005 Document Revised: 01/09/2013 Document Reviewed: 03/05/2011  [employer]® Patient Information ©2014 [employer].

## 2013-12-26 NOTE — Progress Notes (Signed)
   Subjective:    Patient ID: [name], female    DOB: 1956-07-21, 58 y.o.   MRN: [id number]  HPI Here to discuss dyslipidemia. She had previous cholesterol up around 292. with Gyn recent lipids back in September off medication with cholesterol 219 with HDL 49. No history of CAD or peripheral vascular disease. No history of diabetes. She does have hypothyroidism which is treated. Recent TSH was normal. She's had myalgias with Crestor, Lipitor, and Pravachol. Nonsmoker. No family history of premature CAD. No history of hypertension.  Past Medical History  Diagnosis Date  . Hypothyroidism   . Hiatal hernia   . Barrett's esophagus without dysplasia   . GERD (gastroesophageal reflux disease)   . Hyperlipidemia   . Multinodular goiter   . Fundic gland polyps of stomach, benign   . Internal hemorrhoids   . Thyroid cancer s/p total thyroidectomy--  papillary  . Adhesive capsulitis of left shoulder    Past Surgical History  Procedure Laterality Date  . Total thyroidectomy  05-01-2011  . Hemorrhoid surgery    . Laparoscopic salpingoopherectomy  1996    RIGHT OVARIAN CYST  . Appendectomy  01-30-2008  . Laparoscopic tubal ligation  2003    W/   D & E  . Dilation and evacuation  2003    DONE AT TIME OF TUBAL LIGATION  . Transobturator sling  01-07-2005    W/ CYSTOCELE REPAIR    reports that she has quit smoking. She has never used smokeless tobacco. She reports that she drinks alcohol. She reports that she does not use illicit drugs. family history includes Cancer in her father; Diabetes in her father; Thyroid disease in her maternal grandmother and mother. Allergies  Allergen Reactions  . Celestone [Betamethasone Sodium Phosphate] Other (See Comments)    Feels hot and looks sunburned  . Droperidol Anxiety  . Omni-Pac Diarrhea      Review of Systems  Constitutional: Negative for fever, chills, appetite change and unexpected weight change.  Respiratory: Negative for shortness  of breath.   Cardiovascular: Negative for chest pain.       Objective:   Physical Exam  Constitutional: She appears well-developed and well-nourished.  Cardiovascular: Normal rate.   Pulmonary/Chest: Effort normal and breath sounds normal. No respiratory distress. She has no wheezes. She has no rales.  Musculoskeletal: She exhibits no edema.          Assessment & Plan:  Hyperlipidemia. Currently off statin with intolerance to multiple as above. Based on recent labs in November her 10 year risk of CAD event is 1%. We'll recheck fasting lipids today. We'll not recommend statin unless her risk has climbed over 7.2% which seems unlikely based on her overall risk profile

## 2014-01-20 ENCOUNTER — Other Ambulatory Visit: Payer: Self-pay | Admitting: Gastroenterology

## 2014-01-23 ENCOUNTER — Encounter: Payer: BC Managed Care – PPO | Admitting: Gastroenterology

## 2014-03-02 ENCOUNTER — Ambulatory Visit (INDEPENDENT_AMBULATORY_CARE_PROVIDER_SITE_OTHER): Payer: BC Managed Care – PPO | Admitting: General Surgery

## 2014-03-02 ENCOUNTER — Encounter (INDEPENDENT_AMBULATORY_CARE_PROVIDER_SITE_OTHER): Payer: Self-pay | Admitting: General Surgery

## 2014-03-02 VITALS — BP 128/74 | HR 76 | Temp 97.5°F | Ht 67.0 in | Wt 175.0 lb

## 2014-03-02 DIAGNOSIS — K645 Perianal venous thrombosis: Secondary | ICD-10-CM

## 2014-03-02 MED ORDER — DIBUCAINE 1 % EX OINT: TOPICAL_OINTMENT | Freq: Three times a day (TID) | CUTANEOUS | Status: DC | PRN

## 2014-03-02 NOTE — Patient Instructions (Signed)
Bedrest Ice packs and tucks pads alternating Warm tub soaks 1-2 times a day Baby wipes after bm's miralax to avoid constipation

## 2014-03-05 NOTE — Progress Notes (Signed)
Patient ID: NASEEM ADLER, female   DOB: Sep 13, 1956, 58 y.o.   MRN: [id number]  Chief Complaint  Patient presents with  . Hemorrhoids    HPI ELLYANNA HOLTON is a 58 y.o. female.  We are asked to see the patient in consultation by Dr. Elease Hashimoto to evaluate her for her hemorrhoids. The patient is a 58 year old white female who has had only a few episodes of problems with hemorrhoids over the years. She has been lifting some heavy boxes while moving in about a week ago developed pain and swelling at her rectum. She has been putting some local anesthetic cream on the hemorrhoids which has helped a little bit but she still has some discomfort. She denies any blood with her bowel movements. She has had some problems with constipation off and on.  HPI  Past Medical History  Diagnosis Date  . Hypothyroidism   . Hiatal hernia   . Barrett's esophagus without dysplasia   . GERD (gastroesophageal reflux disease)   . Hyperlipidemia   . Multinodular goiter   . Fundic gland polyps of stomach, benign   . Internal hemorrhoids   . Thyroid cancer s/p total thyroidectomy--  papillary  . Adhesive capsulitis of left shoulder     Past Surgical History  Procedure Laterality Date  . Total thyroidectomy  05-01-2011  . Hemorrhoid surgery    . Laparoscopic salpingoopherectomy  1996    RIGHT OVARIAN CYST  . Appendectomy  01-30-2008  . Laparoscopic tubal ligation  2003    W/   D & E  . Dilation and evacuation  2003    DONE AT TIME OF TUBAL LIGATION  . Transobturator sling  01-07-2005    W/ CYSTOCELE REPAIR    Family History  Problem Relation Age of Onset  . Diabetes Father   . Cancer Father     prostate  . Thyroid disease Mother     hyperparathyroidism  . Thyroid disease Maternal Grandmother     Social History History  Substance Use Topics  . Smoking status: Former Research scientist (life sciences)  . Smokeless tobacco: Never Used     Comment: quit 1986  . Alcohol Use: Yes     Comment: rarely    Allergies   Allergen Reactions  . Celestone [Betamethasone Sodium Phosphate] Other (See Comments)    Feels hot and looks sunburned  . Droperidol Anxiety  . Omni-Pac Diarrhea    Current Outpatient Prescriptions  Medication Sig Dispense Refill  . aspirin-acetaminophen-caffeine (EXCEDRIN MIGRAINE) 250-250-65 MG per tablet Take 1 tablet by mouth as needed. As needed for migraine      . Calcium Carbonate-Vit D-Min (CALTRATE 600+D PLUS) 600-400 MG-UNIT per tablet Take 1 tablet by mouth 2 (two) times daily.       . ergocalciferol (VITAMIN D2) 50000 UNITS capsule Take 1 capsule (50,000 Units total) by mouth once a week.  4 capsule  12  . esomeprazole (NEXIUM) 20 MG capsule TAKE 1 CAPSULE ONCE DAILY BEFORE BREAKFAST.  30 capsule  0  . ibuprofen (ADVIL,MOTRIN) 200 MG tablet Take 200 mg by mouth every 6 (six) hours as needed.       Marland Kitchen levothyroxine (SYNTHROID, LEVOTHROID) 125 MCG tablet Take 125 mcg by mouth daily.      . promethazine (PHENERGAN) 25 MG suppository Place 1 suppository (25 mg total) rectally every 6 (six) hours as needed for nausea or vomiting.  12 each  0  . dibucaine (NUPERCAINAL) 1 % ointment Apply topically 3 (three) times daily as needed for  pain.  30 g  0  . promethazine (PHENERGAN) 12.5 MG tablet Take 1 tablet (12.5 mg total) by mouth every 6 (six) hours as needed for nausea.  30 tablet  0   No current facility-administered medications for this visit.    Review of Systems Review of Systems  Constitutional: Negative.   HENT: Negative.   Eyes: Negative.   Respiratory: Negative.   Cardiovascular: Negative.   Gastrointestinal: Positive for rectal pain.  Endocrine: Negative.   Genitourinary: Negative.   Musculoskeletal: Negative.   Skin: Negative.   Allergic/Immunologic: Negative.   Neurological: Negative.   Hematological: Negative.   Psychiatric/Behavioral: Negative.     Blood pressure 128/74, pulse 76, temperature 97.5 F (36.4 C), height 5\' 7"  (1.702 m), weight 175 lb (79.379  kg).  Physical Exam Physical Exam  Constitutional: She is oriented to person, place, and time. She appears well-developed and well-nourished.  HENT:  Head: Normocephalic and atraumatic.  Eyes: Conjunctivae and EOM are normal. Pupils are equal, round, and reactive to light.  Neck: Normal range of motion. Neck supple.  Cardiovascular: Normal rate, regular rhythm and normal heart sounds.   Pulmonary/Chest: Effort normal and breath sounds normal.  Abdominal: Soft. Bowel sounds are normal.  Genitourinary:  In the left perirectal space she has a significant thrombosed external hemorrhoid with surrounding edema that is encompassing a good portion of the lateral half of her perirectal space. There is no skin necrosis. There is no cellulitis.  Musculoskeletal: Normal range of motion.  Lymphadenopathy:    She has no cervical adenopathy.  Neurological: She is alert and oriented to person, place, and time.  Skin: Skin is warm and dry.  Psychiatric: She has a normal mood and affect. Her behavior is normal.    Data Reviewed As above  Assessment    The patient has a thrombosed external hemorrhoid with significant edema around it that has been going on for the last week. At this point I do not think trying to unroof it will speed the healing process. Given the amount of edema around it I don't think that we could completely excise it without putting her at risk of an anal stricture     Plan    At this point I would recommend bedrest and alternating ice packs with Tucks pads for the next several days. She will used to wipes after bowel movements and soaked in 1-2 times a day and especially after a bowel movement. She will use MiraLAX to avoid constipation. She can continue to also used some dibucaine ointment. I will plan to see her back in one week to check her progress       Luella Cook III 03/05/2014, 2:43 PM

## 2014-03-06 ENCOUNTER — Telehealth (INDEPENDENT_AMBULATORY_CARE_PROVIDER_SITE_OTHER): Payer: Self-pay | Admitting: General Surgery

## 2014-03-06 NOTE — Telephone Encounter (Signed)
Patient called in explaining that her thrombosed hemorrhoids has opened and has started bleeding.  She wanted to know what to do from here.  Informed the patient to tuck a pad up against the rectum to help hold pressure against the bleeding site.  Informed her this area could drain for a couple of days.  Educated her on taking sitz baths to help relax the sphincter muscle as well as to help cleanse the are after bowel movements.  The patient verbalized understanding of this information and agreed with this plan of action.  She also explained that now that the hemorrhoid has opened, it is feeling better.

## 2014-03-08 ENCOUNTER — Telehealth (INDEPENDENT_AMBULATORY_CARE_PROVIDER_SITE_OTHER): Payer: Self-pay

## 2014-03-08 NOTE — Telephone Encounter (Signed)
Pt called stating she is doing well from thrombosed hems. She now c/o falling against tub and has pain at her rib. Pt is out of town in [place]. Pt advised to go to urgent care or ER there to have rib injury evaluated. Pt states she will do this. Pt asks that we cx her appt for tomorrow with Dr Marlou Starks since she is oot. Pt states she will call and r/s if hems do not continue to improve.

## 2014-03-09 ENCOUNTER — Encounter (INDEPENDENT_AMBULATORY_CARE_PROVIDER_SITE_OTHER): Payer: BC Managed Care – PPO | Admitting: General Surgery

## 2014-06-08 ENCOUNTER — Other Ambulatory Visit: Payer: Self-pay

## 2014-06-08 DIAGNOSIS — Z1231 Encounter for screening mammogram for malignant neoplasm of breast: Secondary | ICD-10-CM

## 2014-06-14 ENCOUNTER — Ambulatory Visit (AMBULATORY_SURGERY_CENTER): Payer: Self-pay | Admitting: *Deleted

## 2014-06-14 VITALS — Ht 67.0 in | Wt 165.4 lb

## 2014-06-14 DIAGNOSIS — K2270 Barrett's esophagus without dysplasia: Secondary | ICD-10-CM

## 2014-06-14 NOTE — Progress Notes (Signed)
No egg or soy allergy. ewm Past sedation Droperidol causes pt to feel like she was going crazy, couldn't speak, like she was coming out if her skin. ewm No diet pills. ewm No home 02 use. ewm Pt has lost 20 lbs weight since 01-19-14 not trying. But has had a lot of stress. ewm

## 2014-06-21 ENCOUNTER — Ambulatory Visit
Admission: RE | Admit: 2014-06-21 | Discharge: 2014-06-21 | Disposition: A | Discharge status: Home / Self Care | Payer: BC Managed Care – PPO | Source: Ambulatory Visit

## 2014-06-21 DIAGNOSIS — Z1231 Encounter for screening mammogram for malignant neoplasm of breast: Secondary | ICD-10-CM

## 2014-06-22 ENCOUNTER — Encounter: Payer: Self-pay | Admitting: Gastroenterology

## 2014-06-29 ENCOUNTER — Ambulatory Visit (AMBULATORY_SURGERY_CENTER): Payer: BC Managed Care – PPO | Admitting: Gastroenterology

## 2014-06-29 ENCOUNTER — Encounter: Payer: Self-pay | Admitting: Gastroenterology

## 2014-06-29 VITALS — BP 131/76 | HR 78 | Temp 97.3°F | Resp 16 | Ht 67.0 in | Wt 165.0 lb

## 2014-06-29 DIAGNOSIS — K317 Polyp of stomach and duodenum: Secondary | ICD-10-CM

## 2014-06-29 DIAGNOSIS — K219 Gastro-esophageal reflux disease without esophagitis: Secondary | ICD-10-CM

## 2014-06-29 DIAGNOSIS — D131 Benign neoplasm of stomach: Secondary | ICD-10-CM

## 2014-06-29 DIAGNOSIS — K2270 Barrett's esophagus without dysplasia: Secondary | ICD-10-CM

## 2014-06-29 MED ORDER — SODIUM CHLORIDE 0.9 % IV SOLN: 500.0000 mL | INTRAVENOUS | Status: DC

## 2014-06-29 MED ORDER — NEXIUM 40 MG PO CPDR: DELAYED_RELEASE_CAPSULE | ORAL | Status: DC

## 2014-06-29 NOTE — Patient Instructions (Addendum)
YOU HAD AN ENDOSCOPIC PROCEDURE TODAY AT THE [facility]: Refer to the procedure report that was given to you for any specific questions about what was found during the examination.  If the procedure report does not answer your questions, please call your gastroenterologist to clarify.  If you requested that your care partner not be given the details of your procedure findings, then the procedure report has been included in a sealed envelope for you to review at your convenience later.  YOU SHOULD EXPECT: Some feelings of bloating in the abdomen. Passage of more gas than usual.  Walking can help get rid of the air that was put into your GI tract during the procedure and reduce the bloating. If you had a lower endoscopy (such as a colonoscopy or flexible sigmoidoscopy) you may notice spotting of blood in your stool or on the toilet paper. If you underwent a bowel prep for your procedure, then you may not have a normal bowel movement for a few days.  DIET: Your first meal following the procedure should be a light meal and then it is ok to progress to your normal diet.  A half-sandwich or bowl of soup is an example of a good first meal.  Heavy or fried foods are harder to digest and may make you feel nauseous or bloated.  Likewise meals heavy in dairy and vegetables can cause extra gas to form and this can also increase the bloating.  Drink plenty of fluids but you should avoid alcoholic beverages for 24 hours.  ACTIVITY: Your care partner should take you home directly after the procedure.  You should plan to take it easy, moving slowly for the rest of the day.  You can resume normal activity the day after the procedure however you should NOT DRIVE or use heavy machinery for 24 hours (because of the sedation medicines used during the test).    SYMPTOMS TO REPORT IMMEDIATELY: A gastroenterologist can be reached at any hour.  During normal business hours, 8:30 AM to 5:00 PM Monday through Friday,  call [phone].  After hours and on weekends, please call the GI answering service at [phone] who will take a message and have the physician on call contact you.    Following upper endoscopy (EGD)  Vomiting of blood or coffee ground material  New chest pain or pain under the shoulder blades  Painful or persistently difficult swallowing  New shortness of breath  Fever of 100F or higher  Black, tarry-looking stools  FOLLOW UP: If any biopsies were taken you will be contacted by phone or by letter within the next 1-3 weeks.  Call your gastroenterologist if you have not heard about the biopsies in 3 weeks.  Our staff will call the home number listed on your records the next business day following your procedure to check on you and address any questions or concerns that you may have at that time regarding the information given to you following your procedure. This is a courtesy call and so if there is no answer at the home number and we have not heard from you through the emergency physician on call, we will assume that you have returned to your regular daily activities without incident.  SIGNATURES/CONFIDENTIALITY: You and/or your care partner have signed paperwork which will be entered into your electronic medical record.  These signatures attest to the fact that that the information above on your After Visit Summary has been reviewed and is understood.  Full   responsibility of the confidentiality of this discharge information lies with you and/or your care-partner.  Recommendations Await pathology results. Take Nexium 40 mg daily in the morning. Esophagitis and polyp handouts given to patient/caregiver.

## 2014-06-29 NOTE — Progress Notes (Signed)
Stable to RR 

## 2014-06-29 NOTE — Op Note (Signed)
[place]  [address] [place], [address]   ENDOSCOPY PROCEDURE REPORT  PATIENT: Kathleen, Ryan  MR#: [id number] BIRTHDATE: October 27, 1955 , 58  yrs. old GENDER: female ENDOSCOPIST: Ladene Artist, MD, [facility] PROCEDURE DATE:  06/29/2014 PROCEDURE:  EGD w/ biopsy ASA CLASS:     Class II INDICATIONS:  follow up of Barrett's esophagus. MEDICATIONS: Monitored anesthesia care and Propofol 150 mg IV TOPICAL ANESTHETIC: none DESCRIPTION OF PROCEDURE: After the risks benefits and alternatives of the procedure were thoroughly explained, informed consent was obtained.  The [facility] [id number] endoscope was introduced through the mouth and advanced to the second portion of the duodenum , Without limitations.  The instrument was slowly withdrawn as the mucosa was fully examined.  ESOPHAGUS: There was LA Class A esophagitis (One or more mucosal breaks < 5 mm in maximal length) noted.  There was a 1cm segment of Barrett's esophagus found in the distal esophagus.  The length of circumferential Barrett's was 0 cm (Prague C0). Maximal length was 1 cm (Prague M1). Multiple random biopsies were performed.   The esophagus was otherwise normal. STOMACH: Multiple smooth sessile polyps ranging between 3-29mm in size were found in the gastric fundus and gastric body.  Multiple sampling biopsies were performed.   The stomach otherwise appeared normal. DUODENUM: The duodenal mucosa showed no abnormalities in the bulb and 2nd part of the duodenum.  Retroflexed views revealed a small hiatal hernia.   The scope was then withdrawn from the patient and the procedure completed.  COMPLICATIONS: There were no immediate complications.  ENDOSCOPIC IMPRESSION: 1.    LA Class A esophagitis 2.    Barrett's esophagus; multiple random biopsies performed 3.   Multiple sessile polyps in the gastric fundus and gastric body; multiple sampling biopsies performed 4.   Small hiatal  hernia  RECOMMENDATIONS: 1.  Await pathology results 2.  Anti-reflux regimen long term 3.  Continue PPI daily long term: Nexium 40 mg po qam, 1 year of refills 4.  Endoscopy in 3-5 years pending pathology review  eSigned:  Ladene Artist, MD, [facility] 06/29/2014 11:56 AM

## 2014-06-29 NOTE — Progress Notes (Signed)
Called to room to assist during endoscopic procedure.  Patient ID and intended procedure confirmed with present staff. Received instructions for my participation in the procedure from the performing physician.  

## 2014-07-02 ENCOUNTER — Telehealth: Payer: Self-pay | Admitting: *Deleted

## 2014-07-02 NOTE — Telephone Encounter (Signed)
  Follow up Call-  Call back number 06/29/2014  Post procedure Call Back phone  # [phone]  Permission to leave phone message Yes     Patient questions:  Do you have a fever, pain , or abdominal swelling? No. Pain Score  0 *  Have you tolerated food without any problems? Yes.    Have you been able to return to your normal activities? Yes.    Do you have any questions about your discharge instructions: Diet   No. Medications  No. Follow up visit  No.  Do you have questions or concerns about your Care? No.  Actions: * If pain score is 4 or above: No action needed, pain <4.

## 2014-07-07 ENCOUNTER — Encounter: Payer: Self-pay | Admitting: Gastroenterology

## 2014-07-10 ENCOUNTER — Telehealth: Payer: Self-pay | Admitting: Family Medicine

## 2014-07-10 NOTE — Telephone Encounter (Signed)
Pt is having lower back and left hip pain. They cannot find any orthopedic issue wrong. So they are wanted to put pt on prednisone.  Pt would like to discuss options w/ dr Elease Hashimoto. Pt had a mri and they cannot find out what is wrong. Pt would like appt Wednesday, but only same day appt. Pls advise

## 2014-07-11 ENCOUNTER — Telehealth: Payer: Self-pay | Admitting: Gastroenterology

## 2014-07-11 NOTE — Telephone Encounter (Signed)
Patient notified of results and that the letter has been mailed to the patient.  She will call back for any additional questions or problems

## 2014-07-11 NOTE — Telephone Encounter (Signed)
That is fine to use

## 2014-07-11 NOTE — Telephone Encounter (Signed)
Pt has been sch for monday °

## 2014-07-16 ENCOUNTER — Encounter: Payer: Self-pay | Admitting: Family Medicine

## 2014-07-16 ENCOUNTER — Ambulatory Visit (INDEPENDENT_AMBULATORY_CARE_PROVIDER_SITE_OTHER): Payer: BC Managed Care – PPO | Admitting: Family Medicine

## 2014-07-16 VITALS — BP 110/70 | HR 86 | Temp 97.4°F | Wt 165.0 lb

## 2014-07-16 DIAGNOSIS — F4321 Adjustment disorder with depressed mood: Secondary | ICD-10-CM

## 2014-07-16 DIAGNOSIS — M545 Low back pain, unspecified: Secondary | ICD-10-CM

## 2014-07-16 DIAGNOSIS — R634 Abnormal weight loss: Secondary | ICD-10-CM

## 2014-07-16 LAB — HEPATIC FUNCTION PANEL
ALT: 24 U/L (ref 0–35)
AST: 20 U/L (ref 0–37)
Albumin: 3.9 g/dL (ref 3.5–5.2)
Alkaline Phosphatase: 114 U/L (ref 39–117)
Bilirubin, Direct: 0 mg/dL (ref 0.0–0.3)
Total Bilirubin: 0.6 mg/dL (ref 0.2–1.2)
Total Protein: 7.8 g/dL (ref 6.0–8.3)

## 2014-07-16 LAB — POCT URINALYSIS DIPSTICK
Bilirubin, UA: NEGATIVE
Blood, UA: NEGATIVE
Glucose, UA: NEGATIVE
Ketones, UA: NEGATIVE
Nitrite, UA: NEGATIVE
Protein, UA: NEGATIVE
Spec Grav, UA: 1.015
Urobilinogen, UA: 0.2
pH, UA: 7.5

## 2014-07-16 LAB — SEDIMENTATION RATE: Sed Rate: 55 mm/hr — ABNORMAL HIGH (ref 0–22)

## 2014-07-16 LAB — VITAMIN D 25 HYDROXY (VIT D DEFICIENCY, FRACTURES): VITD: 39.41 ng/mL (ref 30.00–100.00)

## 2014-07-16 LAB — TSH: TSH: 1.15 u[IU]/mL (ref 0.35–4.50)

## 2014-07-16 NOTE — Progress Notes (Signed)
Subjective:    Patient ID: [name], female    DOB: 1955-11-02, 58 y.o.   MRN: [id number]  Hip Pain    Patient seen with some left hip and low back pain really off and on for several months. No specific injury. Checked by orthopedist back in August and had couple of rounds of prednisone which did help briefly. She had MRI scan in August which showed only some mild degenerative changes L5-S1. She actually had one injection in her back which did not seem to help much. She states that ice helps.  She has lost about 20 pounds of weight documented since last March. She has diminished appetite. She attributes at least some of this to stress issues with her marriage and her husband having cancer. She does have history of thyroid cancer back in 2013. She is followed by endocrinologist and has hypothyroidism on replacement. She had mammogram in September which was normal. Colonoscopy 2008. She denies any urinary symptoms. No fevers or chills. Recent EGD unremarkable. She's been followed by gynecologist regularly in the past  Past Medical History  Diagnosis Date  . Hypothyroidism   . Hiatal hernia   . Barrett's esophagus without dysplasia   . GERD (gastroesophageal reflux disease)   . Multinodular goiter   . Fundic gland polyps of stomach, benign   . Internal hemorrhoids   . Thyroid cancer s/p total thyroidectomy--  papillary  . Adhesive capsulitis of left shoulder   . Hyperlipidemia     borderline   Past Surgical History  Procedure Laterality Date  . Total thyroidectomy  05-01-2011  . Hemorrhoid surgery    . Laparoscopic salpingoopherectomy  1996    RIGHT OVARIAN CYST  . Appendectomy  01-30-2008  . Laparoscopic tubal ligation  2003    W/   D & E  . Dilation and evacuation  2003    DONE AT TIME OF TUBAL LIGATION  . Transobturator sling  01-07-2005    W/ CYSTOCELE REPAIR  . Upper gastrointestinal endoscopy    . Colonoscopy      reports that she has quit smoking. She started  smoking about 29 years ago. She has never used smokeless tobacco. She reports that she drinks alcohol. She reports that she does not use illicit drugs. family history includes Cancer in her father; Diabetes in her father; Thyroid disease in her maternal grandmother and mother. There is no history of Esophageal cancer or Colon cancer. Allergies  Allergen Reactions  . Celestone [Betamethasone Sodium Phosphate] Other (See Comments)    Feels hot and looks sunburned  . Droperidol Anxiety  . Omni-Pac Diarrhea      Review of Systems  Constitutional: Positive for appetite change, fatigue and unexpected weight change. Negative for fever and chills.  Respiratory: Negative for cough and shortness of breath.   Cardiovascular: Negative for chest pain and palpitations.  Gastrointestinal: Negative for nausea, vomiting, abdominal pain and diarrhea.  Genitourinary: Negative for dysuria.  Musculoskeletal: Positive for back pain.  Skin: Negative for rash.  Hematological: Negative for adenopathy.       Objective:   Physical Exam  Constitutional: She appears well-developed and well-nourished.  Neck: Neck supple. No thyromegaly present.  scar from previous thyroidectomy. No neck masses.  Cardiovascular: Normal rate and regular rhythm.   Pulmonary/Chest: Effort normal and breath sounds normal. No respiratory distress. She has no wheezes. She has no rales.  Abdominal: Soft. Bowel sounds are normal. She exhibits no distension. There is no tenderness. There is no  rebound and no guarding.  Musculoskeletal: She exhibits no edema.  Left hip reveals full range of motion with no pain No spinal tenderness.  Neurological:  Symmetric reflexes lower extremities.  Skin: No rash noted.          Assessment & Plan:  Patient presents with left lumbar back pain and weight loss. She has nonfocal exam. She also has hx of thyroid cancer. Recent MRI lumbar spine as above.  Obtain screening lab work with  sedimentation rate, CBC, chemistries, TSH, vitamin D level. Consider bone scan given history above.  She does have some depressed mood. PH q. 9 score of 11. Consider antidepressant if medical evaluation unremarkable.  Scheduled follow up in one week.

## 2014-07-16 NOTE — Progress Notes (Signed)
Pre visit review using our clinic review tool, if applicable. No additional management support is needed unless otherwise documented below in the visit note. 

## 2014-07-16 NOTE — Patient Instructions (Signed)

## 2014-07-17 ENCOUNTER — Other Ambulatory Visit: Payer: Self-pay | Admitting: Family Medicine

## 2014-07-17 DIAGNOSIS — Z85850 Personal history of malignant neoplasm of thyroid: Secondary | ICD-10-CM

## 2014-07-17 DIAGNOSIS — R700 Elevated erythrocyte sedimentation rate: Secondary | ICD-10-CM

## 2014-07-17 DIAGNOSIS — R634 Abnormal weight loss: Secondary | ICD-10-CM

## 2014-07-17 DIAGNOSIS — M25552 Pain in left hip: Secondary | ICD-10-CM

## 2014-07-17 DIAGNOSIS — M549 Dorsalgia, unspecified: Secondary | ICD-10-CM

## 2014-07-23 ENCOUNTER — Ambulatory Visit: Payer: BC Managed Care – PPO | Admitting: Family Medicine

## 2014-07-24 ENCOUNTER — Encounter (HOSPITAL_COMMUNITY)
Admission: RE | Admit: 2014-07-24 | Discharge: 2014-07-24 | Disposition: A | Discharge status: Home / Self Care | Payer: BC Managed Care – PPO | Source: Ambulatory Visit | Attending: Family Medicine | Admitting: Family Medicine

## 2014-07-24 DIAGNOSIS — Z85850 Personal history of malignant neoplasm of thyroid: Secondary | ICD-10-CM | POA: Diagnosis present

## 2014-07-24 DIAGNOSIS — R700 Elevated erythrocyte sedimentation rate: Secondary | ICD-10-CM

## 2014-07-24 DIAGNOSIS — M25552 Pain in left hip: Secondary | ICD-10-CM | POA: Insufficient documentation

## 2014-07-24 DIAGNOSIS — R634 Abnormal weight loss: Secondary | ICD-10-CM | POA: Diagnosis present

## 2014-07-24 DIAGNOSIS — M549 Dorsalgia, unspecified: Secondary | ICD-10-CM | POA: Diagnosis present

## 2014-07-24 MED ORDER — TECHNETIUM TC 99M MEDRONATE IV KIT
25.2000 | PACK | Freq: Once | INTRAVENOUS | Status: AC | PRN
  Administered 2014-07-24: 25.2 via INTRAVENOUS

## 2014-07-25 ENCOUNTER — Encounter: Payer: Self-pay | Admitting: Family Medicine

## 2014-07-25 ENCOUNTER — Ambulatory Visit (INDEPENDENT_AMBULATORY_CARE_PROVIDER_SITE_OTHER): Payer: BC Managed Care – PPO | Admitting: Family Medicine

## 2014-07-25 VITALS — BP 110/80 | HR 82 | Temp 97.3°F | Wt 168.0 lb

## 2014-07-25 DIAGNOSIS — M545 Low back pain, unspecified: Secondary | ICD-10-CM

## 2014-07-25 NOTE — Progress Notes (Signed)
Subjective:    Patient ID: [name], female    DOB: 1955/10/02, 58 y.o.   MRN: [id number]  HPI Patient seen for follow-up regarding her persistent left lumbar and hip pain. Refer to prior note:  "Patient seen with some left hip and low back pain really off and on for several months. No specific injury. Checked by orthopedist back in August and had couple of rounds of prednisone which did help briefly. She had MRI scan in August which showed only some mild degenerative changes L5-S1. She actually had one injection in her back which did not seem to help much. She states that ice helps.  She has lost about 20 pounds of weight documented since last March. She has diminished appetite. She attributes at least some of this to stress issues with her marriage and her husband having cancer. She does have history of thyroid cancer back in 2013. She is followed by endocrinologist and has hypothyroidism on replacement. She had mammogram in September which was normal. Colonoscopy 2008. She denies any urinary symptoms. No fevers or chills. Recent EGD unremarkable. She's been followed by gynecologist regularly in the past"  We were concerned because of her past history of thyroid cancer and unintentional weight loss. Lab work was unremarkable with the exception of elevated sedimentation rate of 55. Whole body bone scan revealed only mild degenerative arthritis in the knees but specifically no uptake in the back or hip region. Her pain is unchanged. She had taken one very brief course of prednisone perr orthopedics previously for just 3 days which did seem to help. Icing also helps. He has follow-up with orthopedist in 1 week. She was prescribed second course of prednisone which she has not yet started.  Of note, her weight is up 3 pounds today compared to last visit. No new symptoms. No fevers or chills. No skin rash.  Past Medical History  Diagnosis Date  . Hypothyroidism   . Hiatal hernia   . Barrett's  esophagus without dysplasia   . GERD (gastroesophageal reflux disease)   . Multinodular goiter   . Fundic gland polyps of stomach, benign   . Internal hemorrhoids   . Thyroid cancer s/p total thyroidectomy--  papillary  . Adhesive capsulitis of left shoulder   . Hyperlipidemia     borderline   Past Surgical History  Procedure Laterality Date  . Total thyroidectomy  05-01-2011  . Hemorrhoid surgery    . Laparoscopic salpingoopherectomy  1996    RIGHT OVARIAN CYST  . Appendectomy  01-30-2008  . Laparoscopic tubal ligation  2003    W/   D & E  . Dilation and evacuation  2003    DONE AT TIME OF TUBAL LIGATION  . Transobturator sling  01-07-2005    W/ CYSTOCELE REPAIR  . Upper gastrointestinal endoscopy    . Colonoscopy      reports that she has quit smoking. She started smoking about 29 years ago. She has never used smokeless tobacco. She reports that she drinks alcohol. She reports that she does not use illicit drugs. family history includes Cancer in her father; Diabetes in her father; Thyroid disease in her maternal grandmother and mother. There is no history of Esophageal cancer or Colon cancer. Allergies  Allergen Reactions  . Celestone [Betamethasone Sodium Phosphate] Other (See Comments)    Feels hot and looks sunburned  . Droperidol Anxiety  . Omni-Pac Diarrhea       Review of Systems  Constitutional: Negative for fever  and chills.  Respiratory: Negative for shortness of breath.   Cardiovascular: Negative for chest pain.  Gastrointestinal: Negative for abdominal pain.  Skin: Negative for rash.  Hematological: Negative for adenopathy. Does not bruise/bleed easily.       Objective:   Physical Exam  Constitutional: She appears well-developed and well-nourished.  Cardiovascular: Normal rate and regular rhythm.   Pulmonary/Chest: Effort normal and breath sounds normal. No respiratory distress. She has no wheezes. She has no rales.  Musculoskeletal: She exhibits no  edema.  Left hip reveals no pain with abduction or abduction. No pain with hip flexion. No bony tenderness. No tenderness over the greater trochanteric bursa.          Assessment & Plan:  Somewhat poorly localized left lower lumbar and left hip pain. Bone scan unremarkable. We've advised that she follow-up with orthopedist as scheduled and go ahead and start prednisone which they have previously prescribed. She has follow up with them next week.

## 2014-07-25 NOTE — Progress Notes (Signed)
Pre visit review using our clinic review tool, if applicable. No additional management support is needed unless otherwise documented below in the visit note. 

## 2014-07-27 ENCOUNTER — Encounter (HOSPITAL_COMMUNITY): Payer: BC Managed Care – PPO

## 2014-07-27 ENCOUNTER — Ambulatory Visit: Payer: BC Managed Care – PPO | Admitting: Family Medicine

## 2014-08-14 ENCOUNTER — Telehealth: Payer: Self-pay | Admitting: Family Medicine

## 2014-08-14 NOTE — Telephone Encounter (Addendum)
Request for Amendment of Health Information form received from patient 08/10/14 [facility]   Amendment denied. Letter mailed to patient 08/30/14 [facility]

## 2014-08-28 ENCOUNTER — Other Ambulatory Visit: Payer: Self-pay | Admitting: Obstetrics and Gynecology

## 2014-08-28 ENCOUNTER — Encounter (HOSPITAL_BASED_OUTPATIENT_CLINIC_OR_DEPARTMENT_OTHER): Payer: Self-pay | Admitting: *Deleted

## 2014-08-28 NOTE — Progress Notes (Signed)
NPO AFTER MN WITH EXCEPTION CLEAR LIQUIDS UNTIL 0800 (NO CREAM/ MILK PRODUCTS).  ARRIVE AT 1594. NEEDS HG. WILL TAKE SYNTHROID AND NEXIUM AM DOS W/ SIPS OF WATER.

## 2014-08-29 LAB — CYTOLOGY - PAP

## 2014-08-30 ENCOUNTER — Ambulatory Visit (HOSPITAL_BASED_OUTPATIENT_CLINIC_OR_DEPARTMENT_OTHER): Payer: BC Managed Care – PPO | Admitting: Anesthesiology

## 2014-08-30 ENCOUNTER — Encounter (HOSPITAL_BASED_OUTPATIENT_CLINIC_OR_DEPARTMENT_OTHER)
Admission: RE | Disposition: A | Discharge status: Home / Self Care | Payer: Self-pay | Source: Ambulatory Visit | Attending: Orthopedic Surgery

## 2014-08-30 ENCOUNTER — Ambulatory Visit (HOSPITAL_BASED_OUTPATIENT_CLINIC_OR_DEPARTMENT_OTHER)
Admission: RE | Admit: 2014-08-30 | Discharge: 2014-08-30 | Disposition: A | Discharge status: Home / Self Care | Payer: BC Managed Care – PPO | Source: Ambulatory Visit | Attending: Orthopedic Surgery | Admitting: Orthopedic Surgery

## 2014-08-30 ENCOUNTER — Encounter (HOSPITAL_BASED_OUTPATIENT_CLINIC_OR_DEPARTMENT_OTHER): Payer: Self-pay

## 2014-08-30 DIAGNOSIS — Z85850 Personal history of malignant neoplasm of thyroid: Secondary | ICD-10-CM | POA: Insufficient documentation

## 2014-08-30 DIAGNOSIS — K219 Gastro-esophageal reflux disease without esophagitis: Secondary | ICD-10-CM | POA: Insufficient documentation

## 2014-08-30 DIAGNOSIS — E039 Hypothyroidism, unspecified: Secondary | ICD-10-CM | POA: Diagnosis not present

## 2014-08-30 DIAGNOSIS — Z87891 Personal history of nicotine dependence: Secondary | ICD-10-CM | POA: Diagnosis not present

## 2014-08-30 DIAGNOSIS — G5601 Carpal tunnel syndrome, right upper limb: Secondary | ICD-10-CM | POA: Insufficient documentation

## 2014-08-30 DIAGNOSIS — Z79899 Other long term (current) drug therapy: Secondary | ICD-10-CM | POA: Insufficient documentation

## 2014-08-30 DIAGNOSIS — K2270 Barrett's esophagus without dysplasia: Secondary | ICD-10-CM | POA: Insufficient documentation

## 2014-08-30 DIAGNOSIS — E785 Hyperlipidemia, unspecified: Secondary | ICD-10-CM | POA: Diagnosis not present

## 2014-08-30 DIAGNOSIS — M2660 Temporomandibular joint disorder, unspecified: Secondary | ICD-10-CM | POA: Insufficient documentation

## 2014-08-30 DIAGNOSIS — Z888 Allergy status to other drugs, medicaments and biological substances status: Secondary | ICD-10-CM | POA: Diagnosis not present

## 2014-08-30 HISTORY — PX: CARPAL TUNNEL RELEASE: SHX101

## 2014-08-30 HISTORY — DX: Personal history of malignant neoplasm of thyroid: Z85.850

## 2014-08-30 HISTORY — DX: Allergic rhinitis, unspecified: J30.9

## 2014-08-30 HISTORY — DX: Esophagitis, unspecified: K20.9

## 2014-08-30 HISTORY — DX: Esophagitis, unspecified without bleeding: K20.90

## 2014-08-30 HISTORY — DX: Spondylosis without myelopathy or radiculopathy, lumbar region: M47.816

## 2014-08-30 HISTORY — DX: Personal history of other diseases of the digestive system: Z87.19

## 2014-08-30 HISTORY — DX: Unspecified temporomandibular joint disorder, unspecified side: M26.609

## 2014-08-30 HISTORY — DX: Personal history of other diseases of the female genital tract: Z87.42

## 2014-08-30 HISTORY — DX: Postprocedural hypothyroidism: E89.0

## 2014-08-30 LAB — POCT HEMOGLOBIN-HEMACUE: Hemoglobin: 13 g/dL (ref 12.0–15.0)

## 2014-08-30 SURGERY — CARPAL TUNNEL RELEASE
Anesthesia: Monitor Anesthesia Care | Site: Hand | Laterality: Right

## 2014-08-30 MED ORDER — CLINDAMYCIN PHOSPHATE 900 MG/50ML IV SOLN
900.0000 mg | INTRAVENOUS | Status: AC
  Administered 2014-08-30: 900 mg via INTRAVENOUS
  Filled 2014-08-30: qty 50

## 2014-08-30 MED ORDER — PROMETHAZINE HCL 25 MG/ML IJ SOLN
6.2500 mg | INTRAMUSCULAR | Status: DC | PRN
  Filled 2014-08-30: qty 1

## 2014-08-30 MED ORDER — PROPOFOL 10 MG/ML IV EMUL
INTRAVENOUS | Status: DC | PRN
  Administered 2014-08-30: 120 ug/kg/min via INTRAVENOUS

## 2014-08-30 MED ORDER — CLINDAMYCIN PHOSPHATE 900 MG/50ML IV SOLN
INTRAVENOUS | Status: AC
  Filled 2014-08-30: qty 50

## 2014-08-30 MED ORDER — MIDAZOLAM HCL 5 MG/5ML IJ SOLN
INTRAMUSCULAR | Status: DC | PRN
  Administered 2014-08-30: 2 mg via INTRAVENOUS

## 2014-08-30 MED ORDER — CHLORHEXIDINE GLUCONATE 4 % EX LIQD
60.0000 mL | Freq: Once | CUTANEOUS | Status: DC
  Filled 2014-08-30: qty 60

## 2014-08-30 MED ORDER — HYDROCODONE-ACETAMINOPHEN 5-300 MG PO TABS: 1.0000 | ORAL_TABLET | Freq: Four times a day (QID) | ORAL | Status: DC | PRN

## 2014-08-30 MED ORDER — LIDOCAINE HCL (PF) 1 % IJ SOLN
INTRAMUSCULAR | Status: DC | PRN
  Administered 2014-08-30: 5 mL

## 2014-08-30 MED ORDER — FENTANYL CITRATE 0.05 MG/ML IJ SOLN
INTRAMUSCULAR | Status: DC | PRN
Start: 2014-08-30 — End: 2014-08-30
  Administered 2014-08-30: 25 ug via INTRAVENOUS
  Administered 2014-08-30: 50 ug via INTRAVENOUS
  Administered 2014-08-30: 25 ug via INTRAVENOUS

## 2014-08-30 MED ORDER — DOCUSATE SODIUM 100 MG PO CAPS: 100.0000 mg | ORAL_CAPSULE | Freq: Two times a day (BID) | ORAL | Status: DC

## 2014-08-30 MED ORDER — FENTANYL CITRATE 0.05 MG/ML IJ SOLN
INTRAMUSCULAR | Status: AC
  Filled 2014-08-30: qty 6

## 2014-08-30 MED ORDER — FENTANYL CITRATE 0.05 MG/ML IJ SOLN
25.0000 ug | INTRAMUSCULAR | Status: DC | PRN
  Filled 2014-08-30: qty 1

## 2014-08-30 MED ORDER — BUPIVACAINE HCL (PF) 0.25 % IJ SOLN
INTRAMUSCULAR | Status: DC | PRN
  Administered 2014-08-30: 5 mL

## 2014-08-30 MED ORDER — LACTATED RINGERS IV SOLN
INTRAVENOUS | Status: DC
  Administered 2014-08-30: 14:00:00 via INTRAVENOUS
  Filled 2014-08-30: qty 1000

## 2014-08-30 MED ORDER — MIDAZOLAM HCL 2 MG/2ML IJ SOLN
INTRAMUSCULAR | Status: AC
  Filled 2014-08-30: qty 2

## 2014-08-30 SURGICAL SUPPLY — 42 items
BANDAGE ELASTIC 3 VELCRO ST LF (GAUZE/BANDAGES/DRESSINGS) ×2 IMPLANT
BLADE SURG 15 STRL LF DISP TIS (BLADE) ×1 IMPLANT
BLADE SURG 15 STRL SS (BLADE) ×2
BNDG CMPR 9X4 STRL LF SNTH (GAUZE/BANDAGES/DRESSINGS) ×1
BNDG CONFORM 3 STRL LF (GAUZE/BANDAGES/DRESSINGS) ×2 IMPLANT
BNDG ESMARK 4X9 LF (GAUZE/BANDAGES/DRESSINGS) ×2 IMPLANT
CORDS BIPOLAR (ELECTRODE) ×2 IMPLANT
COVER TABLE BACK 60X90 (DRAPES) ×2 IMPLANT
CUFF TOURNIQUET SINGLE 18IN (TOURNIQUET CUFF) ×1 IMPLANT
DRAPE EXTREMITY T 121X128X90 (DRAPE) ×2 IMPLANT
DRAPE LG THREE QUARTER DISP (DRAPES) ×2 IMPLANT
DRAPE SURG 17X23 STRL (DRAPES) ×2 IMPLANT
DRSG EMULSION OIL 3X3 NADH (GAUZE/BANDAGES/DRESSINGS) IMPLANT
GAUZE XEROFORM 1X8 LF (GAUZE/BANDAGES/DRESSINGS) ×2 IMPLANT
GLOVE BIO SURGEON STRL SZ8 (GLOVE) ×2 IMPLANT
GLOVE BIOGEL PI IND STRL 7.5 (GLOVE) IMPLANT
GLOVE BIOGEL PI IND STRL 8.5 (GLOVE) ×1 IMPLANT
GLOVE BIOGEL PI INDICATOR 7.5 (GLOVE) ×3
GLOVE BIOGEL PI INDICATOR 8.5 (GLOVE) ×1
GLOVE SURG SS PI 7.5 STRL IVOR (GLOVE) ×1 IMPLANT
GOWN STRL REUS W/ TWL LRG LVL3 (GOWN DISPOSABLE) ×1 IMPLANT
GOWN STRL REUS W/ TWL XL LVL3 (GOWN DISPOSABLE) ×1 IMPLANT
GOWN STRL REUS W/TWL LRG LVL3 (GOWN DISPOSABLE) ×2
GOWN STRL REUS W/TWL XL LVL3 (GOWN DISPOSABLE) ×4 IMPLANT
KNIFE CARPAL TUNNEL (BLADE) IMPLANT
NDL HYPO 25X1 1.5 SAFETY (NEEDLE) ×2 IMPLANT
NDL SAFETY ECLIPSE 18X1.5 (NEEDLE) IMPLANT
NEEDLE HYPO 18GX1.5 SHARP (NEEDLE)
NEEDLE HYPO 25X1 1.5 SAFETY (NEEDLE) ×2 IMPLANT
NS IRRIG 500ML POUR BTL (IV SOLUTION) ×2 IMPLANT
PACK BASIN DAY SURGERY FS (CUSTOM PROCEDURE TRAY) ×2 IMPLANT
PAD ALCOHOL SWAB (MISCELLANEOUS) ×8 IMPLANT
PAD CAST 3X4 CTTN HI CHSV (CAST SUPPLIES) IMPLANT
PADDING CAST COTTON 3X4 STRL (CAST SUPPLIES) ×2
SPONGE GAUZE 4X4 12PLY STER LF (GAUZE/BANDAGES/DRESSINGS) ×2 IMPLANT
STOCKINETTE 4X48 STRL (DRAPES) ×2 IMPLANT
SUT PROLENE 4 0 PS 2 18 (SUTURE) ×2 IMPLANT
SYR BULB 3OZ (MISCELLANEOUS) ×2 IMPLANT
SYR CONTROL 10ML LL (SYRINGE) ×3 IMPLANT
TOWEL OR 17X24 6PK STRL BLUE (TOWEL DISPOSABLE) ×2 IMPLANT
TRAY DSU PREP LF (CUSTOM PROCEDURE TRAY) ×2 IMPLANT
UNDERPAD 30X30 INCONTINENT (UNDERPADS AND DIAPERS) ×2 IMPLANT

## 2014-08-30 NOTE — Anesthesia Preprocedure Evaluation (Signed)
Anesthesia Evaluation  Patient identified by MRN, date of birth, ID band Patient awake    Reviewed: Allergy & Precautions, H&P , NPO status , Patient's Chart, lab work & pertinent test results  Airway Mallampati: II  TM Distance: >3 FB Neck ROM: Full    Dental no notable dental hx.    Pulmonary neg pulmonary ROS, former smoker,  breath sounds clear to auscultation  Pulmonary exam normal       Cardiovascular negative cardio ROS  Rhythm:Regular Rate:Normal     Neuro/Psych negative neurological ROS  negative psych ROS   GI/Hepatic negative GI ROS, Neg liver ROS, GERD-  Medicated,  Endo/Other  Hypothyroidism   Renal/GU negative Renal ROS  negative genitourinary   Musculoskeletal negative musculoskeletal ROS (+)   Abdominal   Peds negative pediatric ROS (+)  Hematology negative hematology ROS (+)   Anesthesia Other Findings   Reproductive/Obstetrics negative OB ROS                             Anesthesia Physical Anesthesia Plan  ASA: II  Anesthesia Plan: MAC   Post-op Pain Management:    Induction: Intravenous  Airway Management Planned: Simple Face Mask  Additional Equipment:   Intra-op Plan:   Post-operative Plan:   Informed Consent: I have reviewed the patients History and Physical, chart, labs and discussed the procedure including the risks, benefits and alternatives for the proposed anesthesia with the patient or authorized representative who has indicated his/her understanding and acceptance.   Dental advisory given  Plan Discussed with: CRNA and Surgeon  Anesthesia Plan Comments:         Anesthesia Quick Evaluation

## 2014-08-30 NOTE — Brief Op Note (Signed)
08/30/2014  1:17 PM  PATIENT:  [name]  58 y.o. female  PRE-OPERATIVE DIAGNOSIS:  right carpal tunnel syndrome  POST-OPERATIVE DIAGNOSIS:  * No post-op diagnosis entered *  PROCEDURE:  Procedure(s): RIGHT CARPAL TUNNEL RELEASE (Right)  SURGEON:  Surgeon(s) and Role:    * Linna Hoff, MD - Primary  PHYSICIAN ASSISTANT:   ASSISTANTS: none   ANESTHESIA:   none and MAC  EBL:     BLOOD ADMINISTERED:none  DRAINS: none   LOCAL MEDICATIONS USED:  MARCAINE     SPECIMEN:  No Specimen  DISPOSITION OF SPECIMEN:  N/A  COUNTS:  YES  TOURNIQUET:    DICTATION: .Other Dictation: Dictation Number [id number]  PLAN OF CARE: Discharge to home after PACU  PATIENT DISPOSITION:  PACU - hemodynamically stable.   Delay start of Pharmacological VTE agent (>24hrs) due to surgical blood loss or risk of bleeding: not applicable

## 2014-08-30 NOTE — Anesthesia Postprocedure Evaluation (Signed)
  Anesthesia Post-op Note  Patient: [name]  Procedure(s) Performed: Procedure(s) (LRB): RIGHT CARPAL TUNNEL RELEASE (Right)  Patient Location: PACU  Anesthesia Type: MAC  Level of Consciousness: awake and alert   Airway and Oxygen Therapy: Patient Spontanous Breathing  Post-op Pain: mild  Post-op Assessment: Post-op Vital signs reviewed, Patient's Cardiovascular Status Stable, Respiratory Function Stable, Patent Airway and No signs of Nausea or vomiting  Last Vitals:  Filed Vitals:   08/30/14 1450  BP: 109/65  Pulse: 84  Temp:   Resp: 14    Post-op Vital Signs: stable   Complications: No apparent anesthesia complications

## 2014-08-30 NOTE — Discharge Instructions (Signed)
KEEP BANDAGE CLEAN AND DRY CALL OFFICE FOR F/U APPT [phone] in 11 days Dr Caralyn Guile cell phone [phone] KEEP HAND ELEVATED ABOVE HEART OK TO APPLY ICE TO OPERATIVE AREA CONTACT OFFICE IF ANY WORSENING PAIN OR CONCERNS.         HAND SURGERY    HOME CARE INSTRUCTIONS    The following instructions have been prepared to help you care for yourself upon your return home today.  Wound Care:  Keep your hand elevated above the level of your heart. Do not allow it to dangle by your side. Keep the dressing dry and do not remove it unless your doctor advises you to do so. He will usually change it at the time of you post-op visit. Moving your fingers is advised to stimulate circulation but will depend on the site of your surgery. Of course, if you have a splint applied your doctor will advise you about movement.  Activity:  Do not drive or operate machinery today. Rest today and then you may return to your normal activity and work as indicated by your physician.  Diet: Drink liquids today or eat a light diet. You may resume a regular diet tomorrow.  General expectations: Pain for two or three days. Fingers may become slightly swollen.   Unexpected Observations- Call your doctor if any of these occur: Severe pain not relieved by pain medication. Elevated temperature. Dressing soaked with blood. Inability to move fingers. White or bluish color to fingers.     Post Anesthesia Home Care Instructions  Activity: Get plenty of rest for the remainder of the day. A responsible adult should stay with you for 24 hours following the procedure.  For the next 24 hours, DO NOT: -Drive a car -Paediatric nurse -Drink alcoholic beverages -Take any medication unless instructed by your physician -Make any legal decisions or sign important papers.  Meals: Start with liquid foods such as gelatin or soup. Progress to regular foods as tolerated. Avoid greasy, spicy, heavy foods. If nausea and/or  vomiting occur, drink only clear liquids until the nausea and/or vomiting subsides. Call your physician if vomiting continues.  Special Instructions/Symptoms: Your throat may feel dry or sore from the anesthesia or the breathing tube placed in your throat during surgery. If this causes discomfort, gargle with warm salt water. The discomfort should disappear within 24 hours.

## 2014-08-30 NOTE — Transfer of Care (Signed)
Immediate Anesthesia Transfer of Care Note  Patient: [name]  Procedure(s) Performed: Procedure(s) (LRB): RIGHT CARPAL TUNNEL RELEASE (Right)  Patient Location: PACU  Anesthesia Type: MAC  Level of Consciousness: awake, alert , oriented and patient cooperative  Airway & Oxygen Therapy: Patient Spontanous Breathing and Patient connected to face mask oxygen  Post-op Assessment: Report given to PACU RN and Post -op Vital signs reviewed and stable  Post vital signs: Reviewed and stable  Complications: No apparent anesthesia complications

## 2014-08-30 NOTE — H&P (Signed)
[name] is an 58 y.o. female.   Chief Complaint: right hand numbness and tingling HPI: pt followed in  Office pt with long standing right hand carpal tunnel Pt here for surgery Pt with no prior surgery to right hand  Past Medical History  Diagnosis Date  . Barrett's esophagus without dysplasia   . GERD (gastroesophageal reflux disease)   . Fundic gland polyps of stomach, benign   . Internal hemorrhoids   . Hyperlipidemia     borderline  . History of hiatal hernia   . Allergic rhinitis   . History of thyroid cancer     05-01-2011  s/p  total thyroidectomy for multinodular goiter and papillary carcinoma  . Hypothyroidism, postsurgical   . Esophagitis   . Lumbar spondylosis     L4 -- S1  . History of ovarian cyst   . TMJ (temporomandibular joint disorder)     Past Surgical History  Procedure Laterality Date  . Total thyroidectomy  05-01-2011  . Hemorrhoid surgery    . Appendectomy  01-30-2008  . Laparoscopic tubal ligation  07-21-2002    W/   Dilatation and evacuation  . Transobturator sling  01-07-2005    W/ CYSTOCELE REPAIR  . Upper gastrointestinal endoscopy  last one 06-29-2014  . Colonoscopy  08-08-2007  . Laparoscopic right oophorectomy  1996  . Left shoulder closed manipulation/  arthroscopic debridement labrum/  sad/ release  ca ligament/ bursectomy  10-12-2011    Family History  Problem Relation Age of Onset  . Diabetes Father   . Cancer Father     prostate  . Thyroid disease Mother     hyperparathyroidism  . Thyroid disease Maternal Grandmother   . Esophageal cancer Neg Hx   . Colon cancer Neg Hx    Social History:  reports that she quit smoking about 29 years ago. Her smoking use included Cigarettes. She started smoking about 29 years ago. She smoked 0.00 packs per day for 5 years. She has never used smokeless tobacco. She reports that she drinks alcohol. She reports that she does not use illicit drugs.  Allergies:  Allergies  Allergen Reactions   . Celestone [Betamethasone Sodium Phosphate] Other (See Comments)    "Feels hot and looks sunburned"  . Droperidol Other (See Comments)    "feels like I have withdrawal symptoms"  . Omni-Pac [Cefdinir] Diarrhea  . Statins Other (See Comments)    "joint stiffness"    Medications Prior to Admission  Medication Sig Dispense Refill  . Calcium Carbonate-Vit D-Min (CALTRATE 600+D PLUS) 600-400 MG-UNIT per tablet Take 1 tablet by mouth 2 (two) times daily.     . Cholecalciferol (VITAMIN D3) 50000 UNITS CAPS Take 1 capsule by mouth every 6 (six) weeks.    Marland Kitchen ibuprofen (ADVIL,MOTRIN) 200 MG tablet Take 200 mg by mouth every 6 (six) hours as needed.     Marland Kitchen levothyroxine (SYNTHROID, LEVOTHROID) 125 MCG tablet Take 125 mcg by mouth daily before breakfast.     . methocarbamol (ROBAXIN) 500 MG tablet Take 500 mg by mouth every 8 (eight) hours as needed.     . Naproxen Sodium (ALEVE PO) Take 1 capsule by mouth as needed.     Marland Kitchen NEXIUM 40 MG capsule Take 40 mg daily in the morning. 30 capsule 12    No results found for this or any previous visit (from the past 48 hour(s)). No results found.  ROS no recent illnesses or hospitalizations  Height 5\' 7"  (1.702 m), weight 73.936  kg (163 lb). Physical Exam  General Appearance:  Alert, cooperative, no distress, appears stated age  Head:  Normocephalic, without obvious abnormality, atraumatic  Eyes:  Pupils equal, conjunctiva/corneas clear,         Throat: Lips, mucosa, and tongue normal; teeth and gums normal  Neck: No visible masses     Lungs:   respirations unlabored  Chest Wall:  No tenderness or deformity  Heart:  Regular rate and rhythm,  Abdomen:   Soft, non-tender,         Extremities: Right hand: no thenar atrophy, fingers warm well perfused Good cap refil Able to palmar abduct thumb Good digital motion  Pulses: 2+ and symmetric  Skin: Skin color, texture, turgor normal, no rashes or lesions     Neurologic: Normal     Assessment/Plan Right hand carpal tunnel syndrome  Right hand carpal tunnel release  R/B/A DISCUSSED WITH PT IN OFFICE.  PT VOICED UNDERSTANDING OF PLAN CONSENT SIGNED DAY OF SURGERY PT SEEN AND EXAMINED PRIOR TO OPERATIVE PROCEDURE/DAY OF SURGERY SITE MARKED. QUESTIONS ANSWERED WILL GO HOME FOLLOWING SURGERY  WE ARE PLANNING SURGERY FOR YOUR UPPER EXTREMITY. THE RISKS AND BENEFITS OF SURGERY INCLUDE BUT NOT LIMITED TO BLEEDING INFECTION, DAMAGE TO NEARBY NERVES ARTERIES TENDONS, FAILURE OF SURGERY TO ACCOMPLISH ITS INTENDED GOALS, PERSISTENT SYMPTOMS AND NEED FOR FURTHER SURGICAL INTERVENTION. WITH THIS IN MIND WE WILL PROCEED. I HAVE DISCUSSED WITH THE PATIENT THE PRE AND POSTOPERATIVE REGIMEN AND THE DOS AND DON'TS. PT VOICED UNDERSTANDING AND INFORMED CONSENT SIGNED.  Linna Hoff 08/30/2014, 1:17 PM

## 2014-08-31 ENCOUNTER — Encounter (HOSPITAL_BASED_OUTPATIENT_CLINIC_OR_DEPARTMENT_OTHER): Payer: Self-pay | Admitting: Orthopedic Surgery

## 2014-09-04 NOTE — Op Note (Signed)
[name], [name]               ACCOUNT NO.:  [id number]  MEDICAL RECORD NO.:  [id number]  LOCATION:                                 FACILITY:  PHYSICIAN:  Melrose Nakayama, MD  DATE OF BIRTH:  May 20, 1956  DATE OF PROCEDURE:  08/30/2014 DATE OF DISCHARGE:  08/30/2014                              OPERATIVE REPORT   PREOPERATIVE DIAGNOSIS:  Right hand carpal tunnel syndrome.  POSTOPERATIVE DIAGNOSIS:  Right hand carpal tunnel syndrome.  ATTENDING SURGEON:  Melrose Nakayama, MD, who scrubbed and present for the entire procedure.  ASSISTANT SURGEON:  None.  ANESTHESIA:  Xylocaine 1%  and 0.25% Marcaine local block with IV sedation.  PROCEDURE:  Right hand carpal tunnel release.  SURGICAL INDICATIONS:  [name] is a right-hand-dominant female with signs and symptoms consistent with right hand carpal tunnel syndrome. This had been refractory to conservative treatment.  Risks, benefits, and alternatives were discussed in detail with the patient and signed informed consent was obtained.  Risks include, but not limited to bleeding; infection; damage to nearby nerves, arteries, or tendons; loss of motion of wrist and digits; incomplete relief of symptoms; and need for further surgical intervention.  DESCRIPTION OF PROCEDURE:  The patient was properly identified in the preoperative holding area and marked with a permanent marker made on right hand to indicate the correct operative site.  The patient was brought back to the operating room, placed supine on the anesthesia table where the IV sedation was administered.  The patient tolerated this well.  A well-padded tourniquet placed on the right forearm and sealed with 1000 drape.  Local anesthetic was administered.  The right upper extremity was prepped and draped in normal sterile fashion.  Time- out was called, correct site was identified, and procedure then begun. Attention then turned to the right hand.  Several  centimeters incision was made directly in the mid palm.  Limb was then elevated and tourniquet insufflated.  Dissection was carried down through the skin and subcutaneous tissue.  The palmar fascia was incised longitudinally. Direct exposure of the transverse carpal ligament was then carried out under direct visualization, distal one-half transverse carpal ligament released in its entirety.  Further exposure was then carried out proximally and portion of transverse carpal ligament as well as portion of the antebrachial fascia were then released.  The wound was then thoroughly irrigated.  The contents of carpal canal were then inspected. No other abnormalities noted.  Tourniquet deflated.  Hemostasis obtained.  Skin closed using horizontal mattress Prolene sutures. Xeroform dressing, sterile compressive bandage were applied.  The patient tolerated the procedure well and returned to recovery room in good condition.  POSTPROCEDURE PLAN:  The patient discharged home, seen back in the office in approximately 10 days for wound check, suture removal, and begin postoperative carpal tunnel eval and treat.     Melrose Nakayama, MD     FWO/MEDQ  D:  08/30/2014  T:  08/31/2014  Job:  [id number]

## 2014-10-22 ENCOUNTER — Encounter: Payer: Self-pay | Admitting: Family Medicine

## 2014-10-22 ENCOUNTER — Ambulatory Visit (INDEPENDENT_AMBULATORY_CARE_PROVIDER_SITE_OTHER): Payer: BLUE CROSS/BLUE SHIELD | Admitting: Family Medicine

## 2014-10-22 VITALS — BP 128/74 | HR 87 | Temp 97.7°F | Wt 167.0 lb

## 2014-10-22 DIAGNOSIS — H9202 Otalgia, left ear: Secondary | ICD-10-CM

## 2014-10-22 MED ORDER — VITAMIN D3 1.25 MG (50000 UT) PO CAPS: 1.0000 | ORAL_CAPSULE | ORAL | Status: DC

## 2014-10-22 NOTE — Patient Instructions (Signed)
Barotitis Media Barotitis media is inflammation of your middle ear. This occurs when the auditory tube (eustachian tube) leading from the back of your nose (nasopharynx) to your eardrum is blocked. This blockage may result from a cold, environmental allergies, or an upper respiratory infection. Unresolved barotitis media may lead to damage or hearing loss (barotrauma), which may become permanent. HOME CARE INSTRUCTIONS   Use medicines as recommended by your health care provider. Over-the-counter medicines will help unblock the canal and can help during times of air travel.  Do not put anything into your ears to clean or unplug them. Eardrops will not be helpful.  Do not swim, dive, or fly until your health care provider says it is all right to do so. If these activities are necessary, chewing gum with frequent, forceful swallowing may help. It is also helpful to hold your nose and gently blow to pop your ears for equalizing pressure changes. This forces air into the eustachian tube.  Only take over-the-counter or prescription medicines for pain, discomfort, or fever as directed by your health care provider.  A decongestant may be helpful in decongesting the middle ear and make pressure equalization easier. SEEK MEDICAL CARE IF:  You experience a serious form of dizziness in which you feel as if the room is spinning and you feel nauseated (vertigo).  Your symptoms only involve one ear. SEEK IMMEDIATE MEDICAL CARE IF:   You develop a severe headache, dizziness, or severe ear pain.  You have bloody or pus-like drainage from your ears.  You develop a fever.  Your problems do not improve or become worse. MAKE SURE YOU:   Understand these instructions.  Will watch your condition.  Will get help right away if you are not doing well or get worse. Document Released: 09/11/2000 Document Revised: 07/05/2013 Document Reviewed: 04/11/2013 Exit[employer] Patient Information 2015 [employer]. This  information is not intended to replace advice given to you by your health care provider. Make sure you discuss any questions you have with your health care provider.  

## 2014-10-22 NOTE — Progress Notes (Signed)
   Subjective:    Patient ID: [name], female    DOB: 12/01/1955, 59 y.o.   MRN: [id number]  HPI Acute visit. Left earache for 2 days. She's not had any hearing loss. She felt somewhat congested and took some Sudafed and that seemed to help her symptoms. She's not had any vertigo with this episode. She may have had some mild tinnitus past couple days. No fevers or chills  Past Medical History  Diagnosis Date  . Barrett's esophagus without dysplasia   . GERD (gastroesophageal reflux disease)   . Fundic gland polyps of stomach, benign   . Internal hemorrhoids   . Hyperlipidemia     borderline  . History of hiatal hernia   . Allergic rhinitis   . History of thyroid cancer     05-01-2011  s/p  total thyroidectomy for multinodular goiter and papillary carcinoma  . Hypothyroidism, postsurgical   . Esophagitis   . Lumbar spondylosis     L4 -- S1  . History of ovarian cyst   . TMJ (temporomandibular joint disorder)    Past Surgical History  Procedure Laterality Date  . Total thyroidectomy  05-01-2011  . Hemorrhoid surgery    . Appendectomy  01-30-2008  . Laparoscopic tubal ligation  07-21-2002    W/   Dilatation and evacuation  . Transobturator sling  01-07-2005    W/ CYSTOCELE REPAIR  . Upper gastrointestinal endoscopy  last one 06-29-2014  . Colonoscopy  08-08-2007  . Laparoscopic right oophorectomy  1996  . Left shoulder closed manipulation/  arthroscopic debridement labrum/  sad/ release  ca ligament/ bursectomy  10-12-2011  . Carpal tunnel release Right 08/30/2014    Procedure: RIGHT CARPAL TUNNEL RELEASE;  Surgeon: Linna Hoff, MD;  Location: [facility];  Service: Orthopedics;  Laterality: Right;    reports that she quit smoking about 29 years ago. Her smoking use included Cigarettes. She started smoking about 30 years ago. She quit after 5 years of use. She has never used smokeless tobacco. She reports that she drinks alcohol. She reports that she  does not use illicit drugs. family history includes Cancer in her father; Diabetes in her father; Thyroid disease in her maternal grandmother and mother. There is no history of Esophageal cancer or Colon cancer. Allergies  Allergen Reactions  . Celestone [Betamethasone Sodium Phosphate] Other (See Comments)    "Feels hot and looks sunburned"  . Droperidol Other (See Comments)    "feels like I have withdrawal symptoms"  . Omni-Pac [Cefdinir] Diarrhea  . Statins Other (See Comments)    "joint stiffness"      Review of Systems  Constitutional: Negative for fever and chills.  HENT: Positive for congestion and ear pain. Negative for ear discharge.        Objective:   Physical Exam  Constitutional: She appears well-developed and well-nourished.  HENT:  Right Ear: External ear normal.  Left Ear: External ear normal.  Mouth/Throat: Oropharynx is clear and moist.  Cardiovascular: Normal rate and regular rhythm.   Pulmonary/Chest: Effort normal and breath sounds normal. No respiratory distress. She has no wheezes. She has no rales.          Assessment & Plan:  Left ear pain. Totally normal exam of external canal and eardrum. Question barotitis media. Continue Sudafed as needed. Follow-up for any acute hearing loss or other change of symptoms.

## 2014-10-22 NOTE — Progress Notes (Signed)
Pre visit review using our clinic review tool, if applicable. No additional management support is needed unless otherwise documented below in the visit note. 

## 2014-10-23 MED ORDER — VITAMIN D (ERGOCALCIFEROL) 1.25 MG (50000 UNIT) PO CAPS: 50000.0000 [IU] | ORAL_CAPSULE | ORAL | Status: DC

## 2014-10-23 NOTE — Addendum Note (Signed)
Addended by: Townsend Roger D on: 10/23/2014 10:41 AM   Modules accepted: Orders, Medications

## 2014-11-19 ENCOUNTER — Telehealth: Payer: Self-pay | Admitting: Gastroenterology

## 2014-11-19 MED ORDER — RANITIDINE HCL 300 MG PO TABS
300.0000 mg | ORAL_TABLET | Freq: Two times a day (BID) | ORAL | Status: DC
Start: 2014-11-19 — End: 2015-10-17

## 2014-11-19 MED ORDER — NEXIUM 40 MG PO CPDR: DELAYED_RELEASE_CAPSULE | ORAL | Status: DC

## 2014-11-19 NOTE — Telephone Encounter (Signed)
Patient states she has stop taking Nexium x 3 weeks ago. She was concerned about some side effects of Nexium and has been concerned for several years. She has been taking Zantac 300 mg twice a day and Rolaids for breakthrough reflux symptoms. She states that Zantac has been is taking care of her reflux symptoms and has minimal breakthrough reflux. She states we gave her the prescription for Zantac several years ago and had some pills left which is what she has been taking recently. She wants to know if she can continue the Zantac and get a refill. I told her with her hx of Barrett's esophagus that I need to ask Dr. Fuller Plan if this is ok first. Her biopsies from the last 2 EGD's did not show Barrett's. Please advise.

## 2014-11-19 NOTE — Telephone Encounter (Signed)
Nexium, and the other PPIs, would be much better medications for GERD with Barretts.  Zantac 300 mg bid is a second choice. It is her decision.

## 2014-11-19 NOTE — Telephone Encounter (Signed)
Patient states she would like both of them sent to her pharmacy and she will decide. She thinks she will have to go back on Nexium.

## 2014-12-07 ENCOUNTER — Telehealth: Payer: Self-pay | Admitting: Gastroenterology

## 2014-12-10 NOTE — Telephone Encounter (Signed)
Patient has stopped her PPI due to possible long term side effects.  She is having a lot of break through reflux even with rolaids and zantac 300 mg BID.  She does not want to take PPIs.  She is looking for a more holistic approach to her heartburn. She wants to know if you know of a provider she can see for a "holistic" heartburn treatment.  She does have a history of Barrett's.

## 2014-12-10 NOTE — Telephone Encounter (Signed)
Patient notified of the recommendations.  She will call back for any additional questions or concerns

## 2014-12-10 NOTE — Telephone Encounter (Signed)
I cannot recommend a holistic provider. I recommend that she resumes a PPI.

## 2015-02-22 ENCOUNTER — Encounter: Payer: Self-pay | Admitting: Family Medicine

## 2015-02-22 ENCOUNTER — Ambulatory Visit (INDEPENDENT_AMBULATORY_CARE_PROVIDER_SITE_OTHER): Payer: BLUE CROSS/BLUE SHIELD | Admitting: Family Medicine

## 2015-02-22 VITALS — BP 120/80 | HR 82 | Temp 97.9°F | Wt 167.0 lb

## 2015-02-22 DIAGNOSIS — R42 Dizziness and giddiness: Secondary | ICD-10-CM | POA: Diagnosis not present

## 2015-02-22 NOTE — Progress Notes (Signed)
Pre visit review using our clinic review tool, if applicable. No additional management support is needed unless otherwise documented below in the visit note. 

## 2015-02-22 NOTE — Patient Instructions (Signed)
Benign Positional Vertigo Vertigo means you feel like you or your surroundings are moving when they are not. Benign positional vertigo is the most common form of vertigo. Benign means that the cause of your condition is not serious. Benign positional vertigo is more common in older adults. CAUSES  Benign positional vertigo is the result of an upset in the labyrinth system. This is an area in the middle ear that helps control your balance. This may be caused by a viral infection, head injury, or repetitive motion. However, often no specific cause is found. SYMPTOMS  Symptoms of benign positional vertigo occur when you move your head or eyes in different directions. Some of the symptoms may include:  Loss of balance and falls.  Vomiting.  Blurred vision.  Dizziness.  Nausea.  Involuntary eye movements (nystagmus). DIAGNOSIS  Benign positional vertigo is usually diagnosed by physical exam. If the specific cause of your benign positional vertigo is unknown, your caregiver may perform imaging tests, such as magnetic resonance imaging (MRI) or computed tomography (CT). TREATMENT  Your caregiver may recommend movements or procedures to correct the benign positional vertigo. Medicines such as meclizine, benzodiazepines, and medicines for nausea may be used to treat your symptoms. In rare cases, if your symptoms are caused by certain conditions that affect the inner ear, you may need surgery. HOME CARE INSTRUCTIONS   Follow your caregiver's instructions.  Move slowly. Do not make sudden body or head movements.  Avoid driving.  Avoid operating heavy machinery.  Avoid performing any tasks that would be dangerous to you or others during a vertigo episode.  Drink enough fluids to keep your urine clear or pale yellow. SEEK IMMEDIATE MEDICAL CARE IF:   You develop problems with walking, weakness, numbness, or using your arms, hands, or legs.  You have difficulty speaking.  You develop  severe headaches.  Your nausea or vomiting continues or gets worse.  You develop visual changes.  Your family or friends notice any behavioral changes.  Your condition gets worse.  You have a fever.  You develop a stiff neck or sensitivity to light. MAKE SURE YOU:   Understand these instructions.  Will watch your condition.  Will get help right away if you are not doing well or get worse. Document Released: 06/22/2006 Document Revised: 12/07/2011 Document Reviewed: 06/04/2011 Exit[employer] Patient Information 2015 [employer]. This information is not intended to replace advice given to you by your health care provider. Make sure you discuss any questions you have with your health care provider.    

## 2015-02-22 NOTE — Progress Notes (Signed)
Subjective:    Patient ID: [name], female    DOB: May 23, 1956, 59 y.o.   MRN: [id number]  HPI Patient seen with very mild dizziness. She's had history of BPPV in the past and felt this may been similar. She had some laryngitis weeks ago and some sinus congestion but her sinus symptoms are better now. She took some Sudafed. Denies any hearing loss. No speech changes. No swallowing difficulties. No ataxia. No dizziness whatsoever this time. No headaches  Past Medical History  Diagnosis Date  . Barrett's esophagus without dysplasia   . GERD (gastroesophageal reflux disease)   . Fundic gland polyps of stomach, benign   . Internal hemorrhoids   . Hyperlipidemia     borderline  . History of hiatal hernia   . Allergic rhinitis   . History of thyroid cancer     05-01-2011  s/p  total thyroidectomy for multinodular goiter and papillary carcinoma  . Hypothyroidism, postsurgical   . Esophagitis   . Lumbar spondylosis     L4 -- S1  . History of ovarian cyst   . TMJ (temporomandibular joint disorder)    Past Surgical History  Procedure Laterality Date  . Total thyroidectomy  05-01-2011  . Hemorrhoid surgery    . Appendectomy  01-30-2008  . Laparoscopic tubal ligation  07-21-2002    W/   Dilatation and evacuation  . Transobturator sling  01-07-2005    W/ CYSTOCELE REPAIR  . Upper gastrointestinal endoscopy  last one 06-29-2014  . Colonoscopy  08-08-2007  . Laparoscopic right oophorectomy  1996  . Left shoulder closed manipulation/  arthroscopic debridement labrum/  sad/ release  ca ligament/ bursectomy  10-12-2011  . Carpal tunnel release Right 08/30/2014    Procedure: RIGHT CARPAL TUNNEL RELEASE;  Surgeon: Linna Hoff, MD;  Location: [facility];  Service: Orthopedics;  Laterality: Right;    reports that she quit smoking about 29 years ago. Her smoking use included Cigarettes. She started smoking about 30 years ago. She quit after 5 years of use. She has never  used smokeless tobacco. She reports that she drinks alcohol. She reports that she does not use illicit drugs. family history includes Cancer in her father; Diabetes in her father; Thyroid disease in her maternal grandmother and mother. There is no history of Esophageal cancer or Colon cancer. Allergies  Allergen Reactions  . Celestone [Betamethasone Sodium Phosphate] Other (See Comments)    "Feels hot and looks sunburned"  . Droperidol Other (See Comments)    "feels like I have withdrawal symptoms"  . Omni-Pac [Cefdinir] Diarrhea  . Statins Other (See Comments)    "joint stiffness"      Review of Systems  Constitutional: Negative for fever and chills.  HENT: Negative for hearing loss.   Respiratory: Negative for shortness of breath.   Cardiovascular: Negative for chest pain.  Neurological: Negative for headaches.       Objective:   Physical Exam  Constitutional: She is oriented to person, place, and time. She appears well-developed and well-nourished.  HENT:  Right Ear: External ear normal.  Left Ear: External ear normal.  Mouth/Throat: Oropharynx is clear and moist.  Eyes: Pupils are equal, round, and reactive to light.  Neck: Neck supple. No thyromegaly present.  Cardiovascular: Normal rate and regular rhythm.   Pulmonary/Chest: Effort normal and breath sounds normal. No respiratory distress. She has no wheezes. She has no rales.  Lymphadenopathy:    She has no cervical adenopathy.  Neurological:  She is alert and oriented to person, place, and time. No cranial nerve deficit. Coordination normal.          Assessment & Plan:  Transient mild dizziness. History of BPPV. Nonfocal exam. Suspect mild vertigo. Reassurance. We explained that this is frequently self-limited. Vestibular rehabilitation if symptoms persist.

## 2015-06-11 ENCOUNTER — Other Ambulatory Visit: Payer: Self-pay

## 2015-06-11 DIAGNOSIS — Z1231 Encounter for screening mammogram for malignant neoplasm of breast: Secondary | ICD-10-CM

## 2015-08-15 ENCOUNTER — Ambulatory Visit
Admission: RE | Admit: 2015-08-15 | Discharge: 2015-08-15 | Disposition: A | Discharge status: Home / Self Care | Payer: BLUE CROSS/BLUE SHIELD | Source: Ambulatory Visit

## 2015-08-15 ENCOUNTER — Other Ambulatory Visit: Payer: Self-pay | Admitting: Family Medicine

## 2015-08-15 ENCOUNTER — Other Ambulatory Visit: Payer: Self-pay

## 2015-08-15 DIAGNOSIS — Z1231 Encounter for screening mammogram for malignant neoplasm of breast: Secondary | ICD-10-CM

## 2015-08-15 DIAGNOSIS — N63 Unspecified lump in unspecified breast: Secondary | ICD-10-CM

## 2015-09-03 LAB — HEPATIC FUNCTION PANEL
ALT: 20 U/L (ref 7–35)
AST: 22 U/L (ref 13–35)
Alkaline Phosphatase: 87 U/L (ref 25–125)

## 2015-09-03 LAB — BASIC METABOLIC PANEL
BUN: 19 mg/dL (ref 4–21)
Creatinine: 0.7 mg/dL (ref 0.5–1.1)
Glucose: 91 mg/dL
Potassium: 4.5 mmol/L (ref 3.4–5.3)
Sodium: 141 mmol/L (ref 137–147)

## 2015-09-03 LAB — LIPID PANEL
Cholesterol: 217 mg/dL — AB (ref 0–200)
HDL: 58 mg/dL (ref 35–70)
LDL Cholesterol: 147 mg/dL
Triglycerides: 58 mg/dL (ref 40–160)

## 2015-09-03 LAB — CBC AND DIFFERENTIAL
HCT: 41 % (ref 36–46)
Hemoglobin: 13.7 g/dL (ref 12.0–16.0)
Platelets: 257 10*3/uL (ref 150–399)
WBC: 4.8 10^3/mL

## 2015-09-03 LAB — HEMOGLOBIN A1C: Hemoglobin A1C: 5.9

## 2015-09-03 LAB — TSH: TSH: 1.15 u[IU]/mL (ref <=5.90)

## 2015-09-19 ENCOUNTER — Ambulatory Visit (INDEPENDENT_AMBULATORY_CARE_PROVIDER_SITE_OTHER): Payer: BLUE CROSS/BLUE SHIELD | Admitting: Family Medicine

## 2015-09-19 ENCOUNTER — Telehealth: Payer: Self-pay | Admitting: Family Medicine

## 2015-09-19 ENCOUNTER — Encounter: Payer: Self-pay | Admitting: Family Medicine

## 2015-09-19 VITALS — BP 128/81 | HR 93 | Temp 98.0°F | Resp 20 | Wt 173.8 lb

## 2015-09-19 DIAGNOSIS — J209 Acute bronchitis, unspecified: Secondary | ICD-10-CM | POA: Insufficient documentation

## 2015-09-19 MED ORDER — DOXYCYCLINE HYCLATE 100 MG PO TABS: 100.0000 mg | ORAL_TABLET | Freq: Two times a day (BID) | ORAL | Status: DC

## 2015-09-19 NOTE — Telephone Encounter (Signed)
[facility] [place] [facility] Patient Name: [name] DOB: 05-18-1956 Initial Comment caller states she had a cold that started 2 days ago - this am has wheezing and a bad cough and congestion in her chest Nurse Assessment Nurse: Markus Daft, RN, Sherre Poot Date/Time (Eastern Time): 09/19/2015 8:53:11 AM Confirm and document reason for call. If symptomatic, describe symptoms. ---Caller states she had a cold that started 2 days ago with runny nose and watery eyes. This am has wheezing and a deep cough and congestion in her chest. She woke up at 4 am with not being able to cough, voice was weird, and shortness of breath. Breathing in warm mist and finally able to cough and deep and barky cough. Talking is causing shortness of breath now. No fever. Has the patient traveled out of the country within the last 30 days? ---Not Applicable Does the patient have any new or worsening symptoms? ---Yes Will a triage be completed? ---Yes Related visit to physician within the last 2 weeks? ---No Does the PT have any chronic conditions? (i.e. diabetes, asthma, etc.) ---No Thyroid; acid reflux Is this a behavioral health or substance abuse call? ---No Guidelines Guideline Title Affirmed Question Affirmed Notes Cough - Acute Non-Productive Difficulty breathing Final Disposition User Go to ED Now Markus Daft, RN, Windy Comments Caller refused ER advice, and wants appt with her MD. RN spoke with Danae Chen at office and they will talk to MD. He is booked right now. - Caller notified and verb. understanding. Referrals GO TO FACILITY UNDECIDED Disagree/Comply: Disagree Disagree/Comply Reason: Disagree with instructions

## 2015-09-19 NOTE — Telephone Encounter (Signed)
Patient is already scheduled to see another provider at [facility] today

## 2015-09-19 NOTE — Telephone Encounter (Signed)
error 

## 2015-09-19 NOTE — Telephone Encounter (Signed)
I called the pt and advised her unfortunately we do not have any openings today and asked that she go to an urgent care.  She stated she just talked with someone and is scheduled to see a physician at [facility] today at 10:45am.

## 2015-09-19 NOTE — Patient Instructions (Signed)
Acute Bronchitis Bronchitis is inflammation of the airways that extend from the windpipe into the lungs (bronchi). The inflammation often causes mucus to develop. This leads to a cough, which is the most common symptom of bronchitis.  In acute bronchitis, the condition usually develops suddenly and goes away over time, usually in a couple weeks. Smoking, allergies, and asthma can make bronchitis worse. Repeated episodes of bronchitis may cause further lung problems.  CAUSES Acute bronchitis is most often caused by the same virus that causes a cold. The virus can spread from person to person (contagious) through coughing, sneezing, and touching contaminated objects. SIGNS AND SYMPTOMS   Cough.   Fever.   Coughing up mucus.   Body aches.   Chest congestion.   Chills.   Shortness of breath.   Sore throat.  DIAGNOSIS  Acute bronchitis is usually diagnosed through a physical exam. Your health care provider will also ask you questions about your medical history. Tests, such as chest X-rays, are sometimes done to rule out other conditions.  TREATMENT  Acute bronchitis usually goes away in a couple weeks. Oftentimes, no medical treatment is necessary. Medicines are sometimes given for relief of fever or cough. Antibiotic medicines are usually not needed but may be prescribed in certain situations. In some cases, an inhaler may be recommended to help reduce shortness of breath and control the cough. A cool mist vaporizer may also be used to help thin bronchial secretions and make it easier to clear the chest.  HOME CARE INSTRUCTIONS  Get plenty of rest.   Drink enough fluids to keep your urine clear or pale yellow (unless you have a medical condition that requires fluid restriction). Increasing fluids may help thin your respiratory secretions (sputum) and reduce chest congestion, and it will prevent dehydration.   Take medicines only as directed by your health care provider.  If  you were prescribed an antibiotic medicine, finish it all even if you start to feel better.  Avoid smoking and secondhand smoke. Exposure to cigarette smoke or irritating chemicals will make bronchitis worse. If you are a smoker, consider using nicotine gum or skin patches to help control withdrawal symptoms. Quitting smoking will help your lungs heal faster.   Reduce the chances of another bout of acute bronchitis by washing your hands frequently, avoiding people with cold symptoms, and trying not to touch your hands to your mouth, nose, or eyes.   Keep all follow-up visits as directed by your health care provider.  SEEK MEDICAL CARE IF: Your symptoms do not improve after 1 week of treatment.  SEEK IMMEDIATE MEDICAL CARE IF:  You develop an increased fever or chills.   You have chest pain.   You have severe shortness of breath.  You have bloody sputum.   You develop dehydration.  You faint or repeatedly feel like you are going to pass out.  You develop repeated vomiting.  You develop a severe headache. MAKE SURE YOU:   Understand these instructions.  Will watch your condition.  Will get help right away if you are not doing well or get worse.   This information is not intended to replace advice given to you by your health care provider. Make sure you discuss any questions you have with your health care provider.   Document Released: 10/22/2004 Document Revised: 10/05/2014 Document Reviewed: 03/07/2013 Elsevier Interactive Patient Education [employer].   Antibiotics called in for you to start today. Take two times a day for ten days.  Use mucinex and humidifier if you have one. OTC cough syrup if cough persists.

## 2015-09-19 NOTE — Progress Notes (Signed)
Subjective:    Patient ID: [name], female    DOB: 1956-09-10, 59 y.o.   MRN: [id number]  HPI   Fatigue: Patient presents with a 2 to three-day history of increasing fatigue, sneezing, runny nose, watery eyes, feeling of chest cold. She states her symptoms came on abruptly approximately 2-3 days ago and she has been energy less since. She thought she was improving yesterday, but when she woke up this morning she noticed that she feels worse. She took Aleve yesterday evening to help with achiness. She denies fever, chills, headache, nausea, vomit, diarrhea or rash. She states she feels washed out, and has some ear pressure. She has declined the flu shot and tdap, in the past, and states she does not believe in his immunizations. She has no history of asthma or COPD. She states she had walking pneumonia once when she was young.   Past Medical History  Diagnosis Date  . Barrett's esophagus without dysplasia   . GERD (gastroesophageal reflux disease)   . Fundic gland polyps of stomach, benign   . Internal hemorrhoids   . Hyperlipidemia     borderline  . History of hiatal hernia   . Allergic rhinitis   . History of thyroid cancer     05-01-2011  s/p  total thyroidectomy for multinodular goiter and papillary carcinoma  . Hypothyroidism, postsurgical   . Esophagitis   . Lumbar spondylosis     L4 -- S1  . History of ovarian cyst   . TMJ (temporomandibular joint disorder)    Allergies  Allergen Reactions  . Celestone [Betamethasone Sodium Phosphate] Other (See Comments)    "Feels hot and looks sunburned"  . Droperidol Other (See Comments)    "feels like I have withdrawal symptoms"  . Omni-Pac [Cefdinir] Diarrhea and Other (See Comments)    Other Reaction: Other reaction  . Statins Other (See Comments)    "joint stiffness"   Social History   Social History  . Marital Status: Married    Spouse Name: N/A  . Number of Children: N/A  . Years of Education: N/A   Occupational  History  . Not on file.   Social History Main Topics  . Smoking status: Former Smoker -- 5 years    Types: Cigarettes    Start date: 09/28/1984    Quit date: 08/28/1985  . Smokeless tobacco: Never Used  . Alcohol Use: Yes     Comment: rarely  . Drug Use: No  . Sexual Activity: Not on file   Other Topics Concern  . Not on file   Social History Narrative    Review of Systems Negative, with the exception of above mentioned in HPI     Objective:   Physical Exam BP 128/81 mmHg  Pulse 93  Temp(Src) 98 F (36.7 C) (Oral)  Resp 20  Wt 173 lb 12 oz (78.812 kg)  SpO2 98% Gen: Afebrile. No acute distress. Nontoxic in appearance, appears fatigued. Well-developed, well-nourished, Caucasian female. Pleasant. HENT: AT. [place]. Bilateral TM visualized and normal in appearance. MMM, no oral lesions. Bilateral nares mild erythema, no swelling. Throat without erythema or exudates. Cough present on exam. Hoarseness present on exam. No tenderness to palpation facial sinuses. Eyes:Pupils Equal Round Reactive to light, Extraocular movements intact,  Conjunctiva without redness, discharge or icterus. Neck/lymp/endocrine: Supple, anterior cervical lymphadenopathy CV: RRR  Chest: CTAB, no wheeze or crackles. Good air movement. Cough with deep inspiration. Normal respiratory effort. Abd: Soft. NTND. BS present Skin: No rashes,  purpura or petechiae.  Neuro:Normal gait. PERLA. EOMi. Alert. Oriented x3    Assessment & Plan:  1. Acute bronchitis, unspecified organism - Rest, hydrate, Mucinex, humidifier and over-the-counter cough syrup. Advil/Tylenol for fever or muscle aches. - doxycycline (VIBRA-TABS) 100 MG tablet; Take 1 tablet (100 mg total) by mouth 2 (two) times daily.  Dispense: 20 tablet; Refill: 0 - Follow-up when necessary

## 2015-09-19 NOTE — Telephone Encounter (Signed)
Abigail Butts from [facility] called regarding this patient. [facility] gave her outcome to go to ED and patient refused because of her insurance. Patient would like to see MD and not nurse practitioner.

## 2015-09-19 NOTE — Telephone Encounter (Signed)
Ms. Silerio called saying she's had a cold for a while but that's turned into wheezing and she's having difficulty breathing. She only wants to see Dr. Elease Hashimoto. I offered her the 71 with [facility] but she said no. I transferred her also to the triage nurse. Please call the pt if necessary.  Pt's ph# [phone] Thank you.

## 2015-10-01 ENCOUNTER — Other Ambulatory Visit: Payer: Self-pay | Admitting: Orthopedic Surgery

## 2015-10-01 DIAGNOSIS — M1711 Unilateral primary osteoarthritis, right knee: Secondary | ICD-10-CM

## 2015-10-10 ENCOUNTER — Ambulatory Visit
Admission: RE | Admit: 2015-10-10 | Discharge: 2015-10-10 | Disposition: A | Discharge status: Home / Self Care | Payer: BLUE CROSS/BLUE SHIELD | Source: Ambulatory Visit | Attending: Orthopedic Surgery | Admitting: Orthopedic Surgery

## 2015-10-10 DIAGNOSIS — M1711 Unilateral primary osteoarthritis, right knee: Secondary | ICD-10-CM

## 2015-10-17 ENCOUNTER — Ambulatory Visit (INDEPENDENT_AMBULATORY_CARE_PROVIDER_SITE_OTHER): Payer: BLUE CROSS/BLUE SHIELD | Admitting: Family Medicine

## 2015-10-17 ENCOUNTER — Encounter: Payer: Self-pay | Admitting: Family Medicine

## 2015-10-17 VITALS — BP 100/70 | HR 94 | Temp 97.3°F | Ht 67.0 in | Wt 173.2 lb

## 2015-10-17 DIAGNOSIS — Z01818 Encounter for other preprocedural examination: Secondary | ICD-10-CM | POA: Diagnosis not present

## 2015-10-17 MED ORDER — ERGOCALCIFEROL 1.25 MG (50000 UT) PO CAPS: 50000.0000 [IU] | ORAL_CAPSULE | ORAL | Status: DC

## 2015-10-17 NOTE — Progress Notes (Signed)
Subjective:    Patient ID: [name], female    DOB: [birth year], 60 y.o.   MRN: [id number]  HPI   Patient here for preoperative clearance. She is being scheduled for partial right knee replacement. She quit smoking 31 years ago. No chronic lung disease. She has history of GERD, mild hyperlipidemia, hypothyroidism. Multiple recent labs for gynecology and these were reviewed. Thyroid normal range. Blood sugars were normal. Minimally elevated cholesterol.  No history of CAD or peripheral vascular disease. No recent chest pains. Exercise has been limited because of her knee problems. Blood pressures been very well controlled. No recent fevers or chills. She had viral type infection back around Christmas but that is resolved  Past Medical History  Diagnosis Date  . Barrett's esophagus without dysplasia   . GERD (gastroesophageal reflux disease)   . Fundic gland polyps of stomach, benign   . Internal hemorrhoids   . Hyperlipidemia     borderline  . History of hiatal hernia   . Allergic rhinitis   . History of thyroid cancer     05-01-2011  s/p  total thyroidectomy for multinodular goiter and papillary carcinoma  . Hypothyroidism, postsurgical   . Esophagitis   . Lumbar spondylosis     L4 -- S1  . History of ovarian cyst   . TMJ (temporomandibular joint disorder)    Past Surgical History  Procedure Laterality Date  . Total thyroidectomy  05-01-2011  . Hemorrhoid surgery    . Appendectomy  01-30-2008  . Laparoscopic tubal ligation  07-21-2002    W/   Dilatation and evacuation  . Transobturator sling  01-07-2005    W/ CYSTOCELE REPAIR  . Upper gastrointestinal endoscopy  last one 06-29-2014  . Colonoscopy  08-08-2007  . Laparoscopic right oophorectomy  1996  . Left shoulder closed manipulation/  arthroscopic debridement labrum/  sad/ release  ca ligament/ bursectomy  10-12-2011  . Carpal tunnel release Right 08/30/2014    Procedure: RIGHT CARPAL TUNNEL RELEASE;  Surgeon:  Linna Hoff, MD;  Location: [facility];  Service: Orthopedics;  Laterality: Right;    reports that she quit smoking about 30 years ago. Her smoking use included Cigarettes. She started smoking about 31 years ago. She quit after 5 years of use. She has never used smokeless tobacco. She reports that she drinks alcohol. She reports that she does not use illicit drugs. family history includes Cancer in her father; Diabetes in her father; Thyroid disease in her maternal grandmother and mother. There is no history of Esophageal cancer or Colon cancer. Allergies  Allergen Reactions  . Celestone [Betamethasone Sodium Phosphate] Other (See Comments)    "Feels hot and looks sunburned"  . Droperidol Other (See Comments)    "feels like I have withdrawal symptoms"  . Omni-Pac [Cefdinir] Diarrhea and Other (See Comments)    Other Reaction: Other reaction  . Statins Other (See Comments)    "joint stiffness"      Review of Systems  Constitutional: Negative for fever, chills and fatigue.  Eyes: Negative for visual disturbance.  Respiratory: Negative for cough, chest tightness, shortness of breath and wheezing.   Cardiovascular: Negative for chest pain, palpitations and leg swelling.  Gastrointestinal: Negative for abdominal pain.  Genitourinary: Negative for dysuria.  Musculoskeletal: Positive for arthralgias.  Neurological: Negative for dizziness, seizures, syncope, weakness, light-headedness and headaches.       Objective:   Physical Exam  Constitutional: She is oriented to person, place, and time. She appears  well-developed and well-nourished.  HENT:  Mouth/Throat: Oropharynx is clear and moist.  Neck: Neck supple. No thyromegaly present.  No carotid bruits  Cardiovascular: Normal rate and regular rhythm.   Pulmonary/Chest: Effort normal and breath sounds normal. No respiratory distress. She has no wheezes. She has no rales.  Abdominal: Soft. She exhibits no mass. There is  no tenderness. There is no rebound and no guarding.  Musculoskeletal: She exhibits no edema.  Lymphadenopathy:    She has no cervical adenopathy.  Neurological: She is alert and oriented to person, place, and time. No cranial nerve deficit.          Assessment & Plan:  Preoperative clearance. Patient being scheduled for right partial knee replacement. Check EKG. She's not had one in 5 years. Exam nonfocal.  EKG sinus rhythm with no acute changes. No contraindications for surgery. Forms completed.

## 2015-10-21 ENCOUNTER — Encounter: Payer: Self-pay | Admitting: Family Medicine

## 2015-11-04 ENCOUNTER — Ambulatory Visit: Payer: Self-pay | Admitting: Orthopedic Surgery

## 2015-11-04 NOTE — Progress Notes (Signed)
Preoperative surgical orders have been place into the Epic hospital system for [name] on 11/04/2015, 11:18 AM  by Mickel Crow for surgery on 12-09-15.  Preop Uni Knee orders including Experal, IV Tylenol, and IV Decadron as long as there are no contraindications to the above medications. Arlee Muslim, PA-C

## 2015-11-22 ENCOUNTER — Telehealth: Payer: Self-pay | Admitting: Gastroenterology

## 2015-11-22 NOTE — Telephone Encounter (Signed)
Dr. Fuller Plan do you approve of change to Dr. Silverio Decamp?

## 2015-11-22 NOTE — Telephone Encounter (Signed)
OK with me.

## 2015-11-25 NOTE — Telephone Encounter (Signed)
Dr. Silverio Decamp will you accept?

## 2015-11-25 NOTE — Telephone Encounter (Signed)
Reviewed chart, my recommendations would be similar to Dr Lynne Leader and unlikely to be much different. Please inform patient

## 2015-11-25 NOTE — Telephone Encounter (Signed)
Patient notified She would like to come in and discuss with Dr. Silverio Decamp.  She is notified per Dr. Silverio Decamp that her recommendation would also be long term PPI therapy and that she would not recommend a holistic approach off PPIs

## 2015-11-28 ENCOUNTER — Other Ambulatory Visit (HOSPITAL_COMMUNITY): Payer: Self-pay | Admitting: *Deleted

## 2015-11-28 NOTE — Progress Notes (Signed)
EKG 10-17-15 EPIC

## 2015-11-28 NOTE — Patient Instructions (Addendum)
[name]  11/28/2015   Your procedure is scheduled on: 12-09-15  Report to [facility] Main  Entrance take [facility]  elevators to 3rd floor to  [place] at 515 AM.  Call this number if you have problems the morning of surgery [phone]   Remember: ONLY 1 PERSON MAY GO WITH YOU TO SHORT STAY TO GET  READY MORNING OF [place].  Do not eat food or drink liquids :After Midnight.     Take these medicines the morning of surgery with A SIP OF WATER: LEVOTHYROXINE (SYNTHROID), NEXIUM              You may not have any metal on your body including hair pins and              piercings  Do not wear jewelry, make-up, lotions, powders or perfumes, deodorant             Do not wear nail polish.  Do not shave  48 hours prior to surgery.              Men may shave face and neck.   Do not bring valuables to the hospital. [place].  Contacts, dentures or bridgework may not be worn into surgery.  Leave suitcase in the car. After surgery it may be brought to your room.     Patients discharged the day of surgery will not be allowed to drive home.  Name and phone number of your driver:  Special Instructions: N/A              Please read over the following fact sheets you were given: _____________________________________________________________________             [facility] - Preparing for Surgery Before surgery, you can play an important role.  Because skin is not sterile, your skin needs to be as free of germs as possible.  You can reduce the number of germs on your skin by washing with CHG (chlorahexidine gluconate) soap before surgery.  CHG is an antiseptic cleaner which kills germs and bonds with the skin to continue killing germs even after washing. Please DO NOT use if you have an allergy to CHG or antibacterial soaps.  If your skin becomes reddened/irritated stop using the CHG and inform your nurse when  you arrive at Short Stay. Do not shave (including legs and underarms) for at least 48 hours prior to the first CHG shower.  You may shave your face/neck. Please follow these instructions carefully:  1.  Shower with CHG Soap the night before surgery and the  morning of Surgery.  2.  If you choose to wash your hair, wash your hair first as usual with your  normal  shampoo.  3.  After you shampoo, rinse your hair and body thoroughly to remove the  shampoo.                           4.  Use CHG as you would any other liquid soap.  You can apply chg directly  to the skin and wash                       Gently with a scrungie or  clean washcloth.  5.  Apply the CHG Soap to your body ONLY FROM THE NECK DOWN.   Do not use on face/ open                           Wound or open sores. Avoid contact with eyes, ears mouth and genitals (private parts).                       Wash face,  Genitals (private parts) with your normal soap.             6.  Wash thoroughly, paying special attention to the area where your surgery  will be performed.  7.  Thoroughly rinse your body with warm water from the neck down.  8.  DO NOT shower/wash with your normal soap after using and rinsing off  the CHG Soap.                9.  Pat yourself dry with a clean towel.            10.  Wear clean pajamas.            11.  Place clean sheets on your bed the night of your first shower and do not  sleep with pets. Day of Surgery : Do not apply any lotions/deodorants the morning of surgery.  Please wear clean clothes to the hospital/surgery center.  FAILURE TO FOLLOW THESE INSTRUCTIONS MAY RESULT IN THE CANCELLATION OF YOUR SURGERY PATIENT SIGNATURE_________________________________  NURSE SIGNATURE__________________________________  ________________________________________________________________________   Adam Phenix  An incentive spirometer is a tool that can help keep your lungs clear and active. This tool measures  how well you are filling your lungs with each breath. Taking long deep breaths may help reverse or decrease the chance of developing breathing (pulmonary) problems (especially infection) following:  A long period of time when you are unable to move or be active. BEFORE THE PROCEDURE   If the spirometer includes an indicator to show your best effort, your nurse or respiratory therapist will set it to a desired goal.  If possible, sit up straight or lean slightly forward. Try not to slouch.  Hold the incentive spirometer in an upright position. INSTRUCTIONS FOR USE   Sit on the edge of your bed if possible, or sit up as far as you can in bed or on a chair.  Hold the incentive spirometer in an upright position.  Breathe out normally.  Place the mouthpiece in your mouth and seal your lips tightly around it.  Breathe in slowly and as deeply as possible, raising the piston or the ball toward the top of the column.  Hold your breath for 3-5 seconds or for as long as possible. Allow the piston or ball to fall to the bottom of the column.  Remove the mouthpiece from your mouth and breathe out normally.  Rest for a few seconds and repeat Steps 1 through 7 at least 10 times every 1-2 hours when you are awake. Take your time and take a few normal breaths between deep breaths.  The spirometer may include an indicator to show your best effort. Use the indicator as a goal to work toward during each repetition.  After each set of 10 deep breaths, practice coughing to be sure your lungs are clear. If you have an incision (the cut made at the time of surgery), support your incision when coughing  by placing a pillow or rolled up towels firmly against it. Once you are able to get out of bed, walk around indoors and cough well. You may stop using the incentive spirometer when instructed by your caregiver.  RISKS AND COMPLICATIONS  Take your time so you do not get dizzy or light-headed.  If you are in  pain, you may need to take or ask for pain medication before doing incentive spirometry. It is harder to take a deep breath if you are having pain. AFTER USE  Rest and breathe slowly and easily.  It can be helpful to keep track of a log of your progress. Your caregiver can provide you with a simple table to help with this. If you are using the spirometer at home, follow these instructions: [place] IF:   You are having difficultly using the spirometer.  You have trouble using the spirometer as often as instructed.  Your pain medication is not giving enough relief while using the spirometer.  You develop fever of 100.5 F (38.1 C) or higher. SEEK IMMEDIATE MEDICAL CARE IF:   You cough up bloody sputum that had not been present before.  You develop fever of 102 F (38.9 C) or greater.  You develop worsening pain at or near the incision site. MAKE SURE YOU:   Understand these instructions.  Will watch your condition.  Will get help right away if you are not doing well or get worse. Document Released: 01/25/2007 Document Revised: 12/07/2011 Document Reviewed: 03/28/2007 Exit[employer] Patient Information 2014 [employer], [place].   ________________________________________________________________________  WHAT IS A BLOOD TRANSFUSION? Blood Transfusion Information  A transfusion is the replacement of blood or some of its parts. Blood is made up of multiple cells which provide different functions.  Red blood cells carry oxygen and are used for blood loss replacement.  White blood cells fight against infection.  Platelets control bleeding.  Plasma helps clot blood.  Other blood products are available for specialized needs, such as hemophilia or other clotting disorders. BEFORE THE TRANSFUSION  Who gives blood for transfusions?   Healthy volunteers who are fully evaluated to make sure their blood is safe. This is blood bank blood. Transfusion therapy is the safest it has  ever been in the practice of medicine. Before blood is taken from a donor, a complete history is taken to make sure that person has no history of diseases nor engages in risky social behavior (examples are intravenous drug use or sexual activity with multiple partners). The donor's travel history is screened to minimize risk of transmitting infections, such as malaria. The donated blood is tested for signs of infectious diseases, such as HIV and hepatitis. The blood is then tested to be sure it is compatible with you in order to minimize the chance of a transfusion reaction. If you or a relative donates blood, this is often done in anticipation of surgery and is not appropriate for emergency situations. It takes many days to process the donated blood. RISKS AND COMPLICATIONS Although transfusion therapy is very safe and saves many lives, the main dangers of transfusion include:   Getting an infectious disease.  Developing a transfusion reaction. This is an allergic reaction to something in the blood you were given. Every precaution is taken to prevent this. The decision to have a blood transfusion has been considered carefully by your caregiver before blood is given. Blood is not given unless the benefits outweigh the risks. AFTER THE TRANSFUSION  Right after receiving a  blood transfusion, you will usually feel much better and more energetic. This is especially true if your red blood cells have gotten low (anemic). The transfusion raises the level of the red blood cells which carry oxygen, and this usually causes an energy increase.  The nurse administering the transfusion will monitor you carefully for complications. HOME CARE INSTRUCTIONS  No special instructions are needed after a transfusion. You may find your energy is better. Speak with your caregiver about any limitations on activity for underlying diseases you may have. SEEK MEDICAL CARE IF:   Your condition is not improving after your  transfusion.  You develop redness or irritation at the intravenous (IV) site. SEEK IMMEDIATE MEDICAL CARE IF:  Any of the following symptoms occur over the next 12 hours:  Shaking chills.  You have a temperature by mouth above 102 F (38.9 C), not controlled by medicine.  Chest, back, or muscle pain.  People around you feel you are not acting correctly or are confused.  Shortness of breath or difficulty breathing.  Dizziness and fainting.  You get a rash or develop hives.  You have a decrease in urine output.  Your urine turns a dark color or changes to pink, red, or brown. Any of the following symptoms occur over the next 10 days:  You have a temperature by mouth above 102 F (38.9 C), not controlled by medicine.  Shortness of breath.  Weakness after normal activity.  The white part of the eye turns yellow (jaundice).  You have a decrease in the amount of urine or are urinating less often.  Your urine turns a dark color or changes to pink, red, or brown. Document Released: 09/11/2000 Document Revised: 12/07/2011 Document Reviewed: 04/30/2008 [facility] Patient Information 2014 [employer], [place].  _______________________________________________________________________

## 2015-12-03 ENCOUNTER — Encounter (HOSPITAL_COMMUNITY)
Admission: RE | Admit: 2015-12-03 | Discharge: 2015-12-03 | Disposition: A | Discharge status: Home / Self Care | Payer: BLUE CROSS/BLUE SHIELD | Source: Ambulatory Visit | Attending: Orthopedic Surgery | Admitting: Orthopedic Surgery

## 2015-12-03 ENCOUNTER — Encounter (HOSPITAL_COMMUNITY): Payer: Self-pay

## 2015-12-03 DIAGNOSIS — M1711 Unilateral primary osteoarthritis, right knee: Secondary | ICD-10-CM | POA: Diagnosis not present

## 2015-12-03 DIAGNOSIS — Z0183 Encounter for blood typing: Secondary | ICD-10-CM | POA: Diagnosis not present

## 2015-12-03 DIAGNOSIS — Z01812 Encounter for preprocedural laboratory examination: Secondary | ICD-10-CM | POA: Insufficient documentation

## 2015-12-03 HISTORY — DX: Other specified postprocedural states: Z98.890

## 2015-12-03 HISTORY — DX: Nausea with vomiting, unspecified: R11.2

## 2015-12-03 LAB — ABO/RH: ABO/RH(D): A POS

## 2015-12-03 LAB — URINALYSIS, ROUTINE W REFLEX MICROSCOPIC
Bilirubin Urine: NEGATIVE
Glucose, UA: NEGATIVE mg/dL
Hgb urine dipstick: NEGATIVE
Ketones, ur: NEGATIVE mg/dL
Nitrite: NEGATIVE
Protein, ur: NEGATIVE mg/dL
Specific Gravity, Urine: 1.018 (ref 1.005–1.030)
pH: 5.5 (ref 5.0–8.0)

## 2015-12-03 LAB — CBC
HCT: 40.8 % (ref 36.0–46.0)
Hemoglobin: 13.4 g/dL (ref 12.0–15.0)
MCH: 29.8 pg (ref 26.0–34.0)
MCHC: 32.8 g/dL (ref 30.0–36.0)
MCV: 90.7 fL (ref 78.0–100.0)
Platelets: 272 10*3/uL (ref 150–400)
RBC: 4.5 MIL/uL (ref 3.87–5.11)
RDW: 13 % (ref 11.5–15.5)
WBC: 4 10*3/uL (ref 4.0–10.5)

## 2015-12-03 LAB — COMPREHENSIVE METABOLIC PANEL
ALT: 16 U/L (ref 14–54)
AST: 18 U/L (ref 15–41)
Albumin: 4.7 g/dL (ref 3.5–5.0)
Alkaline Phosphatase: 94 U/L (ref 38–126)
Anion gap: 11 (ref 5–15)
BUN: 14 mg/dL (ref 6–20)
CO2: 27 mmol/L (ref 22–32)
Calcium: 9.2 mg/dL (ref 8.9–10.3)
Chloride: 106 mmol/L (ref 101–111)
Creatinine, Ser: 0.68 mg/dL (ref 0.44–1.00)
GFR calc Af Amer: >60 mL/min (ref >60)
GFR calc non Af Amer: >60 mL/min (ref >60)
Glucose, Bld: 102 mg/dL — ABNORMAL HIGH (ref 65–99)
Potassium: 4.5 mmol/L (ref 3.5–5.1)
Sodium: 144 mmol/L (ref 135–145)
Total Bilirubin: 0.7 mg/dL (ref 0.3–1.2)
Total Protein: 7.2 g/dL (ref 6.5–8.1)

## 2015-12-03 LAB — SURGICAL PCR SCREEN
MRSA, PCR: NEGATIVE
Staphylococcus aureus: NEGATIVE

## 2015-12-03 LAB — URINE MICROSCOPIC-ADD ON
Bacteria, UA: NONE SEEN
RBC / HPF: NONE SEEN RBC/hpf (ref 0–5)

## 2015-12-03 LAB — PROTIME-INR
INR: 1.1 (ref 0.00–1.49)
Prothrombin Time: 13.9 seconds (ref 11.6–15.2)

## 2015-12-03 LAB — APTT: aPTT: 34 seconds (ref 24–37)

## 2015-12-03 NOTE — Progress Notes (Signed)
Medical clearcne note dr Elease Hashimoto on chart ekg 10-17-15 epic

## 2015-12-03 NOTE — Progress Notes (Signed)
MICRO AND [place] FAXED TO DR Wynelle Link BY EPIC

## 2015-12-08 ENCOUNTER — Ambulatory Visit: Payer: Self-pay | Admitting: Orthopedic Surgery

## 2015-12-08 NOTE — Anesthesia Preprocedure Evaluation (Signed)
Anesthesia Evaluation  Patient identified by MRN, date of birth, ID band Patient awake    Reviewed: Allergy & Precautions, H&P , NPO status , Patient's Chart, lab work & pertinent test results  History of Anesthesia Complications (+) PONV and history of anesthetic complications  Airway Mallampati: II  TM Distance: >3 FB Neck ROM: Full    Dental no notable dental hx.    Pulmonary neg pulmonary ROS, former smoker,    Pulmonary exam normal breath sounds clear to auscultation       Cardiovascular negative cardio ROS Normal cardiovascular exam Rhythm:Regular Rate:Normal     Neuro/Psych negative neurological ROS  negative psych ROS   GI/Hepatic negative GI ROS, Neg liver ROS, GERD  Medicated,  Endo/Other  Hypothyroidism   Renal/GU negative Renal ROS  negative genitourinary   Musculoskeletal negative musculoskeletal ROS (+)   Abdominal   Peds negative pediatric ROS (+)  Hematology negative hematology ROS (+)   Anesthesia Other Findings   Reproductive/Obstetrics negative OB ROS                             Anesthesia Physical  Anesthesia Plan  ASA: II  Anesthesia Plan: Spinal   Post-op Pain Management:    Induction: Intravenous  Airway Management Planned: Simple Face Mask  Additional Equipment:   Intra-op Plan:   Post-operative Plan:   Informed Consent: I have reviewed the patients History and Physical, chart, labs and discussed the procedure including the risks, benefits and alternatives for the proposed anesthesia with the patient or authorized representative who has indicated his/her understanding and acceptance.   Dental advisory given  Plan Discussed with: CRNA and Surgeon  Anesthesia Plan Comments:         Anesthesia Quick Evaluation

## 2015-12-08 NOTE — H&P (Signed)
TOTAL KNEE ADMISSION H&P  Patient is being admitted for right unicompartmental knee arthroplasty.  Subjective:  Chief Complaint:right knee pain.  HPI: [name], 60 y.o. female, has a history of pain and functional disability in the right knee due to arthritis and has failed non-surgical conservative treatments for greater than 12 weeks to includecorticosteriod injections, viscosupplementation injections and activity modification.  Onset of symptoms was gradual,  with gradually worsening course since that time. [name] had problems with that right knee for over a year and a half now. She seen Dr. Vickey Huger and had cortisone and viscosupplement injections. Unfortunately, she did not have much benefit from those. The knee is hurting her at most times. Something which can and cannot do. Pain is isolated to the medial side of her knee. She does not get swelling. . Patient has worsening of pain with activity and weight bearing and pain that interferes with activities of daily living.   There is no active infection. She has got medial compartment arthritis with significant pain and dysfunction. She has had cortisone and viscosupplement injections without benefit. At this point, the most predictable means of getting her better, short and long-term would be a unicompartmental arthroplasty. We did discuss this in detail and she would like to proceed.  Patient Active Problem List   Diagnosis Date Noted  . Acute bronchitis 09/19/2015  . External hemorrhoid, thrombosed 03/02/2014  . Thyroid cancer ([place]) 05/04/2011  . PALPITATIONS, RECURRENT 10/14/2010  . BENIGN POSITIONAL VERTIGO 07/30/2010  . EXOGENOUS OBESITY 08/14/2009  . COLONIC POLYPS, HYPERPLASTIC, HX OF 04/17/2008  . HYPOTHYROIDISM 11/24/2007  . HYPERLIPIDEMIA 11/24/2007  . GERD 11/24/2007  . HIATAL HERNIA 11/24/2007   Past Medical History  Diagnosis Date  . Barrett's esophagus without dysplasia     pt says was suspected never confirmed 9  or 10 years ago  . GERD (gastroesophageal reflux disease)   . Fundic gland polyps of stomach, benign   . Internal hemorrhoids   . Hyperlipidemia     borderline  . History of hiatal hernia   . Allergic rhinitis   . History of thyroid cancer     05-01-2011  s/p  total thyroidectomy for multinodular goiter and papillary carcinoma  . Hypothyroidism, postsurgical   . Esophagitis   . Lumbar spondylosis     L4 -- S1  . History of ovarian cyst   . TMJ (temporomandibular joint disorder)   . PONV (postoperative nausea and vomiting)     nausea with pain meds    Past Surgical History  Procedure Laterality Date  . Total thyroidectomy  05-01-2011  . Hemorrhoid surgery    . Appendectomy  01-30-2008  . Laparoscopic tubal ligation  07-21-2002    W/   Dilatation and evacuation  . Transobturator sling  01-07-2005    W/ CYSTOCELE REPAIR  . Upper gastrointestinal endoscopy  last one 06-29-2014  . Colonoscopy  08-08-2007  . Laparoscopic right oophorectomy  1996  . Left shoulder closed manipulation/  arthroscopic debridement labrum/  sad/ release  ca ligament/ bursectomy  10-12-2011  . Carpal tunnel release Right 08/30/2014    Procedure: RIGHT CARPAL TUNNEL RELEASE;  Surgeon: Linna Hoff, MD;  [facility];  Service: Orthopedics;  Laterality: Right;     (Not in a hospital admission) Allergies  Allergen Reactions  . Celestone [Betamethasone Sodium Phosphate] Other (See Comments)    "Feels hot and looks sunburned"  . Droperidol Other (See Comments)    "feels like I have  withdrawal symptoms"  . Omni-Pac [Cefdinir] Diarrhea and Other (See Comments)    Other Reaction: Other reaction  . Statins Other (See Comments)    "joint stiffness"  . Hydrocodone Itching and Rash    Social History  Substance Use Topics  . Smoking status: Former Smoker -- 5 years    Types: Cigarettes    Start date: 09/28/1984    Quit date: 08/28/1985  . Smokeless tobacco: Never Used  . Alcohol  Use: Yes     Comment: rarely    Family History  Problem Relation Age of Onset  . Diabetes Father   . Cancer Father     prostate  . Thyroid disease Mother     hyperparathyroidism  . Thyroid disease Maternal Grandmother   . Esophageal cancer Neg Hx   . Colon cancer Neg Hx      Review of Systems  Constitutional: Negative.   Respiratory: Negative.   Cardiovascular: Negative.   Gastrointestinal: Negative.   Genitourinary: Negative.   Musculoskeletal: Positive for joint pain.    Objective:  Physical Exam  Constitutional: She appears well-developed and well-nourished.  Eyes: EOM are normal. Pupils are equal, round, and reactive to light.  Neck: Neck supple.  Cardiovascular: Normal rate and regular rhythm.   Respiratory: Breath sounds normal.  GI: Soft.  Musculoskeletal:  Her hips show normal range of motion of discomfort. Left knee, no effusion. Range 0 to 135. No tenderness or instability. The right knee shows range about 0 to 130. Moderate crepitus and range of motion. She is very tender medially. There is no lateral tenderness or instability noted. Her pulse sensation, motor intact distally.    Vital signs Weight: 173 lb Height: 66in Body Surface Area: 1.88 m Body Mass Index: 27.92 kg/m  BP: 118/70 (Sitting, Left Arm, Standard)  Estimated body mass index is 26.53 kg/(m^2) as calculated from the following:   Height as of 12/03/15: 5' 7.5" (1.715 m).   Weight as of 12/03/15: 78.019 kg (172 lb).   Imaging Review RADIOGRAPHS AP both knees and lateral show that she is isolated medial compartment arthritis just about bone-on-bone. No patellofemoral or lateral involvement. Assessment/Plan:  Medial compartment arthritis, right knee   The patient history, physical examination, clinical judgment of the provider and imaging studies are consistent with medial compartmental arthritis of the right knee(s) and unicompartmental knee arthroplasty is deemed medically necessary.  The treatment options including medical management, injection therapy arthroscopy and arthroplasty were discussed at length. The risks and benefits of total knee arthroplasty were presented and reviewed. The risks due to aseptic loosening, infection, stiffness, patella tracking problems, thromboembolic complications and other imponderables were discussed. The patient acknowledged the explanation, agreed to proceed with the plan and consent was signed. Patient is being admitted for inpatient treatment for surgery, pain control, PT, OT, prophylactic antibiotics, VTE prophylaxis, progressive ambulation and ADL's and discharge planning. The patient is planning to be discharged home with home health services

## 2015-12-09 ENCOUNTER — Ambulatory Visit (HOSPITAL_COMMUNITY): Payer: BLUE CROSS/BLUE SHIELD | Admitting: Anesthesiology

## 2015-12-09 ENCOUNTER — Encounter (HOSPITAL_COMMUNITY): Payer: Self-pay | Admitting: Anesthesiology

## 2015-12-09 ENCOUNTER — Observation Stay (HOSPITAL_COMMUNITY)
Admission: RE | Admit: 2015-12-09 | Discharge: 2015-12-11 | Disposition: A | Discharge status: Home / Self Care | Payer: BLUE CROSS/BLUE SHIELD | Source: Ambulatory Visit | Attending: Orthopedic Surgery | Admitting: Orthopedic Surgery

## 2015-12-09 ENCOUNTER — Encounter (HOSPITAL_COMMUNITY)
Admission: RE | Disposition: A | Discharge status: Home / Self Care | Payer: Self-pay | Source: Ambulatory Visit | Attending: Orthopedic Surgery

## 2015-12-09 DIAGNOSIS — Z85850 Personal history of malignant neoplasm of thyroid: Secondary | ICD-10-CM | POA: Diagnosis not present

## 2015-12-09 DIAGNOSIS — Z87891 Personal history of nicotine dependence: Secondary | ICD-10-CM | POA: Diagnosis not present

## 2015-12-09 DIAGNOSIS — E785 Hyperlipidemia, unspecified: Secondary | ICD-10-CM | POA: Diagnosis not present

## 2015-12-09 DIAGNOSIS — K219 Gastro-esophageal reflux disease without esophagitis: Secondary | ICD-10-CM | POA: Diagnosis not present

## 2015-12-09 DIAGNOSIS — M1711 Unilateral primary osteoarthritis, right knee: Secondary | ICD-10-CM | POA: Diagnosis not present

## 2015-12-09 DIAGNOSIS — E039 Hypothyroidism, unspecified: Secondary | ICD-10-CM | POA: Diagnosis not present

## 2015-12-09 DIAGNOSIS — M1710 Unilateral primary osteoarthritis, unspecified knee: Secondary | ICD-10-CM | POA: Diagnosis present

## 2015-12-09 DIAGNOSIS — Z9049 Acquired absence of other specified parts of digestive tract: Secondary | ICD-10-CM | POA: Insufficient documentation

## 2015-12-09 DIAGNOSIS — M179 Osteoarthritis of knee, unspecified: Secondary | ICD-10-CM | POA: Diagnosis present

## 2015-12-09 HISTORY — DX: Unspecified hemorrhoids: K64.9

## 2015-12-09 HISTORY — PX: PARTIAL KNEE ARTHROPLASTY: SHX2174

## 2015-12-09 LAB — TYPE AND SCREEN
ABO/RH(D): A POS
Antibody Screen: NEGATIVE

## 2015-12-09 SURGERY — ARTHROPLASTY, KNEE, UNICOMPARTMENTAL
Anesthesia: Spinal | Site: Knee | Laterality: Right

## 2015-12-09 MED ORDER — ACETAMINOPHEN 650 MG RE SUPP: 650.0000 mg | Freq: Four times a day (QID) | RECTAL | Status: DC | PRN

## 2015-12-09 MED ORDER — MENTHOL 3 MG MT LOZG: 1.0000 | LOZENGE | OROMUCOSAL | Status: DC | PRN

## 2015-12-09 MED ORDER — BUPIVACAINE HCL (PF) 0.25 % IJ SOLN
INTRAMUSCULAR | Status: AC
  Filled 2015-12-09: qty 30

## 2015-12-09 MED ORDER — MIDAZOLAM HCL 2 MG/2ML IJ SOLN
INTRAMUSCULAR | Status: AC
Start: 2015-12-09 — End: 2015-12-09
  Filled 2015-12-09: qty 2

## 2015-12-09 MED ORDER — LACTATED RINGERS IV SOLN
INTRAVENOUS | Status: DC | PRN
  Administered 2015-12-09 (×2): via INTRAVENOUS

## 2015-12-09 MED ORDER — DEXAMETHASONE SODIUM PHOSPHATE 10 MG/ML IJ SOLN
INTRAMUSCULAR | Status: AC
  Filled 2015-12-09: qty 1

## 2015-12-09 MED ORDER — ONDANSETRON HCL 4 MG/2ML IJ SOLN
4.0000 mg | Freq: Four times a day (QID) | INTRAMUSCULAR | Status: DC | PRN
  Administered 2015-12-09 – 2015-12-11 (×2): 4 mg via INTRAVENOUS
  Filled 2015-12-09 (×2): qty 2

## 2015-12-09 MED ORDER — BUPIVACAINE HCL (PF) 0.25 % IJ SOLN
INTRAMUSCULAR | Status: DC | PRN
  Administered 2015-12-09: 20 mL

## 2015-12-09 MED ORDER — PHENOL 1.4 % MT LIQD
1.0000 | OROMUCOSAL | Status: DC | PRN
  Filled 2015-12-09: qty 177

## 2015-12-09 MED ORDER — SODIUM CHLORIDE 0.9 % IV SOLN
INTRAVENOUS | Status: DC
  Administered 2015-12-09 – 2015-12-10 (×2): via INTRAVENOUS

## 2015-12-09 MED ORDER — CEFAZOLIN SODIUM-DEXTROSE 2-3 GM-% IV SOLR
INTRAVENOUS | Status: AC
  Filled 2015-12-09: qty 50

## 2015-12-09 MED ORDER — METHOCARBAMOL 500 MG PO TABS
500.0000 mg | ORAL_TABLET | Freq: Four times a day (QID) | ORAL | Status: DC | PRN
  Administered 2015-12-09 – 2015-12-11 (×4): 500 mg via ORAL
  Filled 2015-12-09 (×4): qty 1

## 2015-12-09 MED ORDER — DEXAMETHASONE SODIUM PHOSPHATE 10 MG/ML IJ SOLN
10.0000 mg | Freq: Once | INTRAMUSCULAR | Status: AC
  Administered 2015-12-09: 10 mg via INTRAVENOUS

## 2015-12-09 MED ORDER — TRAMADOL HCL 50 MG PO TABS
50.0000 mg | ORAL_TABLET | Freq: Four times a day (QID) | ORAL | Status: DC | PRN
  Administered 2015-12-09 (×3): 50 mg via ORAL
  Administered 2015-12-10 (×3): 100 mg via ORAL
  Administered 2015-12-11: 50 mg via ORAL
  Administered 2015-12-11: 100 mg via ORAL
  Filled 2015-12-09: qty 2
  Filled 2015-12-09: qty 1
  Filled 2015-12-09 (×2): qty 2
  Filled 2015-12-09: qty 1
  Filled 2015-12-09: qty 2
  Filled 2015-12-09: qty 1
  Filled 2015-12-09: qty 2
  Filled 2015-12-09: qty 1

## 2015-12-09 MED ORDER — BUPIVACAINE LIPOSOME 1.3 % IJ SUSP
INTRAMUSCULAR | Status: DC | PRN
  Administered 2015-12-09: 20 mL

## 2015-12-09 MED ORDER — LIDOCAINE HCL (CARDIAC) 20 MG/ML IV SOLN
INTRAVENOUS | Status: AC
  Filled 2015-12-09: qty 5

## 2015-12-09 MED ORDER — PROMETHAZINE HCL 25 MG/ML IJ SOLN: 6.2500 mg | INTRAMUSCULAR | Status: DC | PRN

## 2015-12-09 MED ORDER — ACETAMINOPHEN 10 MG/ML IV SOLN
INTRAVENOUS | Status: AC
  Filled 2015-12-09: qty 100

## 2015-12-09 MED ORDER — BUPIVACAINE HCL (PF) 0.75 % IJ SOLN: INTRAMUSCULAR | Status: DC | PRN

## 2015-12-09 MED ORDER — HYDROMORPHONE HCL 1 MG/ML IJ SOLN: 0.5000 mg | INTRAMUSCULAR | Status: DC | PRN

## 2015-12-09 MED ORDER — LACTATED RINGERS IV SOLN: INTRAVENOUS | Status: DC

## 2015-12-09 MED ORDER — PROPOFOL 500 MG/50ML IV EMUL
INTRAVENOUS | Status: DC | PRN
  Administered 2015-12-09: 75 ug/kg/min via INTRAVENOUS

## 2015-12-09 MED ORDER — ONDANSETRON HCL 4 MG/2ML IJ SOLN
INTRAMUSCULAR | Status: DC | PRN
  Administered 2015-12-09: 4 mg via INTRAVENOUS

## 2015-12-09 MED ORDER — MIDAZOLAM HCL 5 MG/5ML IJ SOLN
INTRAMUSCULAR | Status: DC | PRN
  Administered 2015-12-09: 2 mg via INTRAVENOUS

## 2015-12-09 MED ORDER — CEFAZOLIN SODIUM-DEXTROSE 2-3 GM-% IV SOLR
2.0000 g | INTRAVENOUS | Status: AC
  Administered 2015-12-09: 2 g via INTRAVENOUS

## 2015-12-09 MED ORDER — NON FORMULARY
Status: DC | PRN
  Administered 2015-12-09: 3000 mg via TOPICAL

## 2015-12-09 MED ORDER — ACETAMINOPHEN 10 MG/ML IV SOLN
1000.0000 mg | Freq: Once | INTRAVENOUS | Status: AC
  Administered 2015-12-09: 1000 mg via INTRAVENOUS

## 2015-12-09 MED ORDER — METOCLOPRAMIDE HCL 5 MG/ML IJ SOLN
INTRAMUSCULAR | Status: DC | PRN
  Administered 2015-12-09: 10 mg via INTRAVENOUS

## 2015-12-09 MED ORDER — EPHEDRINE SULFATE 50 MG/ML IJ SOLN
INTRAMUSCULAR | Status: AC
  Filled 2015-12-09: qty 1

## 2015-12-09 MED ORDER — METHOCARBAMOL 1000 MG/10ML IJ SOLN
500.0000 mg | Freq: Four times a day (QID) | INTRAVENOUS | Status: DC | PRN
  Administered 2015-12-09 – 2015-12-11 (×2): 500 mg via INTRAVENOUS
  Filled 2015-12-09 (×4): qty 5

## 2015-12-09 MED ORDER — HYDROMORPHONE HCL 2 MG PO TABS
2.0000 mg | ORAL_TABLET | ORAL | Status: DC | PRN
  Administered 2015-12-10: 2 mg via ORAL
  Administered 2015-12-10: 1 mg via ORAL
  Administered 2015-12-10: 2 mg via ORAL
  Filled 2015-12-09 (×4): qty 1

## 2015-12-09 MED ORDER — RIVAROXABAN 10 MG PO TABS
10.0000 mg | ORAL_TABLET | Freq: Every day | ORAL | Status: DC
  Administered 2015-12-10 – 2015-12-11 (×2): 10 mg via ORAL
  Filled 2015-12-09 (×3): qty 1

## 2015-12-09 MED ORDER — SODIUM CHLORIDE 0.9 % IV SOLN: INTRAVENOUS | Status: DC

## 2015-12-09 MED ORDER — BISACODYL 10 MG RE SUPP: 10.0000 mg | Freq: Every day | RECTAL | Status: DC | PRN

## 2015-12-09 MED ORDER — BUPIVACAINE LIPOSOME 1.3 % IJ SUSP
20.0000 mL | Freq: Once | INTRAMUSCULAR | Status: DC
  Filled 2015-12-09: qty 20

## 2015-12-09 MED ORDER — LEVOTHYROXINE SODIUM 125 MCG PO TABS
125.0000 ug | ORAL_TABLET | Freq: Every day | ORAL | Status: DC
  Administered 2015-12-10 – 2015-12-11 (×2): 125 ug via ORAL
  Filled 2015-12-09 (×3): qty 1

## 2015-12-09 MED ORDER — SODIUM CHLORIDE 0.9 % IJ SOLN
INTRAMUSCULAR | Status: AC
  Filled 2015-12-09: qty 10

## 2015-12-09 MED ORDER — ACETAMINOPHEN 325 MG PO TABS
650.0000 mg | ORAL_TABLET | Freq: Four times a day (QID) | ORAL | Status: DC | PRN
  Administered 2015-12-10 – 2015-12-11 (×4): 650 mg via ORAL
  Filled 2015-12-09 (×4): qty 2

## 2015-12-09 MED ORDER — POLYETHYLENE GLYCOL 3350 17 G PO PACK: 17.0000 g | PACK | Freq: Every day | ORAL | Status: DC | PRN

## 2015-12-09 MED ORDER — METOCLOPRAMIDE HCL 10 MG PO TABS: 5.0000 mg | ORAL_TABLET | Freq: Three times a day (TID) | ORAL | Status: DC | PRN

## 2015-12-09 MED ORDER — DIPHENHYDRAMINE HCL 12.5 MG/5ML PO ELIX: 12.5000 mg | ORAL_SOLUTION | ORAL | Status: DC | PRN

## 2015-12-09 MED ORDER — PHENYLEPHRINE HCL 10 MG/ML IJ SOLN
INTRAMUSCULAR | Status: DC | PRN
  Administered 2015-12-09: 40 ug via INTRAVENOUS

## 2015-12-09 MED ORDER — FENTANYL CITRATE (PF) 100 MCG/2ML IJ SOLN
INTRAMUSCULAR | Status: DC | PRN
  Administered 2015-12-09 (×2): 25 ug via INTRAVENOUS
  Administered 2015-12-09 (×3): 50 ug via INTRAVENOUS

## 2015-12-09 MED ORDER — ONDANSETRON HCL 4 MG/2ML IJ SOLN
INTRAMUSCULAR | Status: AC
  Filled 2015-12-09: qty 2

## 2015-12-09 MED ORDER — GLYCOPYRROLATE 0.2 MG/ML IJ SOLN
INTRAMUSCULAR | Status: AC
  Filled 2015-12-09: qty 1

## 2015-12-09 MED ORDER — FENTANYL CITRATE (PF) 100 MCG/2ML IJ SOLN
INTRAMUSCULAR | Status: AC
  Filled 2015-12-09: qty 2

## 2015-12-09 MED ORDER — FENTANYL CITRATE (PF) 100 MCG/2ML IJ SOLN
25.0000 ug | INTRAMUSCULAR | Status: DC | PRN
  Administered 2015-12-09: 50 ug via INTRAVENOUS

## 2015-12-09 MED ORDER — SODIUM CHLORIDE 0.9 % IR SOLN
Status: DC | PRN
  Administered 2015-12-09: 1

## 2015-12-09 MED ORDER — ACETAMINOPHEN 500 MG PO TABS
1000.0000 mg | ORAL_TABLET | Freq: Four times a day (QID) | ORAL | Status: AC
  Administered 2015-12-09 – 2015-12-10 (×4): 1000 mg via ORAL
  Filled 2015-12-09 (×4): qty 2

## 2015-12-09 MED ORDER — DOCUSATE SODIUM 100 MG PO CAPS
100.0000 mg | ORAL_CAPSULE | Freq: Two times a day (BID) | ORAL | Status: DC
Start: 2015-12-09 — End: 2015-12-11
  Administered 2015-12-09 – 2015-12-11 (×5): 100 mg via ORAL

## 2015-12-09 MED ORDER — PROPOFOL 10 MG/ML IV BOLUS
INTRAVENOUS | Status: AC
  Filled 2015-12-09: qty 60

## 2015-12-09 MED ORDER — TRANEXAMIC ACID 1000 MG/10ML IV SOLN
2000.0000 mg | Freq: Once | INTRAVENOUS | Status: DC
  Filled 2015-12-09: qty 20

## 2015-12-09 MED ORDER — PANTOPRAZOLE SODIUM 40 MG PO TBEC
80.0000 mg | DELAYED_RELEASE_TABLET | Freq: Every day | ORAL | Status: DC
  Filled 2015-12-09: qty 2

## 2015-12-09 MED ORDER — ONDANSETRON HCL 4 MG PO TABS
4.0000 mg | ORAL_TABLET | Freq: Four times a day (QID) | ORAL | Status: DC | PRN
  Administered 2015-12-10 (×3): 4 mg via ORAL
  Filled 2015-12-09 (×3): qty 1

## 2015-12-09 MED ORDER — FLEET ENEMA 7-19 GM/118ML RE ENEM: 1.0000 | ENEMA | Freq: Once | RECTAL | Status: DC | PRN

## 2015-12-09 MED ORDER — METOCLOPRAMIDE HCL 5 MG/ML IJ SOLN: 5.0000 mg | Freq: Three times a day (TID) | INTRAMUSCULAR | Status: DC | PRN

## 2015-12-09 MED ORDER — METOCLOPRAMIDE HCL 5 MG/ML IJ SOLN
INTRAMUSCULAR | Status: AC
  Filled 2015-12-09: qty 2

## 2015-12-09 MED ORDER — CEFAZOLIN SODIUM-DEXTROSE 2-3 GM-% IV SOLR
2.0000 g | Freq: Four times a day (QID) | INTRAVENOUS | Status: AC
  Administered 2015-12-09 (×2): 2 g via INTRAVENOUS
  Filled 2015-12-09 (×2): qty 50

## 2015-12-09 MED ORDER — BUPIVACAINE IN DEXTROSE 0.75-8.25 % IT SOLN
INTRATHECAL | Status: DC | PRN
  Administered 2015-12-09: 2 mL via INTRATHECAL

## 2015-12-09 MED ORDER — SODIUM CHLORIDE 0.9 % IJ SOLN
INTRAMUSCULAR | Status: AC
  Filled 2015-12-09: qty 50

## 2015-12-09 MED ORDER — SODIUM CHLORIDE 0.9 % IJ SOLN
INTRAMUSCULAR | Status: DC | PRN
  Administered 2015-12-09: 30 mL

## 2015-12-09 SURGICAL SUPPLY — 44 items
BAG DECANTER FOR FLEXI CONT (MISCELLANEOUS) ×2 IMPLANT
BAG SPEC THK2 15X12 ZIP CLS (MISCELLANEOUS)
BAG ZIPLOCK 12X15 (MISCELLANEOUS) IMPLANT
BANDAGE ACE 6X5 VEL STRL LF (GAUZE/BANDAGES/DRESSINGS) ×2 IMPLANT
BLADE SAW RECIPROCATING 77.5 (BLADE) ×2 IMPLANT
BLADE SAW SGTL 13.0X1.19X90.0M (BLADE) ×2 IMPLANT
BOWL SMART MIX CTS (DISPOSABLE) ×2 IMPLANT
BUR OVAL CARBIDE 4.0 (BURR) ×2 IMPLANT
CAPT KNEE PARTIAL 2 ×2 IMPLANT
CEMENT HV SMART SET (Cement) ×2 IMPLANT
CLOTH BEACON ORANGE TIMEOUT ST (SAFETY) ×2 IMPLANT
CUFF TOURN SGL QUICK 34 (TOURNIQUET CUFF) ×2
CUFF TRNQT CYL 34X4X40X1 (TOURNIQUET CUFF) ×1 IMPLANT
DRSG ADAPTIC 3X8 NADH LF (GAUZE/BANDAGES/DRESSINGS) ×2 IMPLANT
DRSG PAD ABDOMINAL 8X10 ST (GAUZE/BANDAGES/DRESSINGS) ×3 IMPLANT
DURAPREP 26ML APPLICATOR (WOUND CARE) ×2 IMPLANT
ELECT REM PT RETURN 9FT ADLT (ELECTROSURGICAL) ×2
ELECTRODE REM PT RTRN 9FT ADLT (ELECTROSURGICAL) ×1 IMPLANT
EVACUATOR 1/8 PVC DRAIN (DRAIN) ×2 IMPLANT
GAUZE SPONGE 4X4 12PLY STRL (GAUZE/BANDAGES/DRESSINGS) ×2 IMPLANT
GLOVE BIO SURGEON STRL SZ7.5 (GLOVE) ×2 IMPLANT
GLOVE BIO SURGEON STRL SZ8 (GLOVE) ×2 IMPLANT
GLOVE BIOGEL PI IND STRL 8 (GLOVE) ×2 IMPLANT
GLOVE BIOGEL PI INDICATOR 8 (GLOVE) ×2
GOWN STRL REUS W/TWL LRG LVL3 (GOWN DISPOSABLE) ×2 IMPLANT
GOWN STRL REUS W/TWL XL LVL3 (GOWN DISPOSABLE) ×2 IMPLANT
HANDPIECE INTERPULSE COAX TIP (DISPOSABLE) ×2
IMMOBILIZER KNEE 20 (SOFTGOODS) ×2
IMMOBILIZER KNEE 20 THIGH 36 (SOFTGOODS) ×1 IMPLANT
KIT IMPL STRL TIB IPOLY IUNI IMPLANT
MANIFOLD NEPTUNE II (INSTRUMENTS) ×2 IMPLANT
PACK TOTAL KNEE CUSTOM (KITS) ×2 IMPLANT
PADDING CAST COTTON 6X4 STRL (CAST SUPPLIES) ×4 IMPLANT
POSITIONER SURGICAL ARM (MISCELLANEOUS) ×2 IMPLANT
SET HNDPC FAN SPRY TIP SCT (DISPOSABLE) ×1 IMPLANT
STRIP CLOSURE SKIN 1/2X4 (GAUZE/BANDAGES/DRESSINGS) ×3 IMPLANT
SUT MNCRL AB 4-0 PS2 18 (SUTURE) ×2 IMPLANT
SUT VIC AB 2-0 CT1 27 (SUTURE) ×4
SUT VIC AB 2-0 CT1 TAPERPNT 27 (SUTURE) ×2 IMPLANT
SUT VLOC 180 0 24IN GS25 (SUTURE) ×2 IMPLANT
SYR 50ML LL SCALE MARK (SYRINGE) ×2 IMPLANT
TRAY FOLEY W/METER SILVER 14FR (SET/KITS/TRAYS/PACK) ×2 IMPLANT
TRAY FOLEY W/METER SILVER 16FR (SET/KITS/TRAYS/PACK) ×2 IMPLANT
WRAP KNEE MAXI GEL POST OP (GAUZE/BANDAGES/DRESSINGS) ×1 IMPLANT

## 2015-12-09 NOTE — Transfer of Care (Signed)
Immediate Anesthesia Transfer of Care Note  Patient: [name]  Procedure(s) Performed: Procedure(s): RIGHT KNEE MEDIAL UNICOMPARTMENTAL ARTHROPLASTY  (Right)  Patient Location: PACU  Anesthesia Type:General  Level of Consciousness:  sedated, patient cooperative and responds to stimulation  Airway & Oxygen Therapy:Patient Spontanous Breathing and Patient connected to face mask oxgen  Post-op Assessment:  Report given to PACU RN and Post -op Vital signs reviewed and stable  Post vital signs:  Reviewed and stable  Last Vitals:  Filed Vitals:   12/09/15 0537  BP: 142/86  Pulse: 92  Temp: 36.5 C  Resp: 18    Complications: No apparent anesthesia complications

## 2015-12-09 NOTE — Anesthesia Postprocedure Evaluation (Signed)
Anesthesia Post Note  Patient: [name]  Procedure(s) Performed: Procedure(s) (LRB): RIGHT KNEE MEDIAL UNICOMPARTMENTAL ARTHROPLASTY  (Right)  Patient location during evaluation: PACU Anesthesia Type: General and Spinal Level of consciousness: awake and alert Pain management: pain level controlled Vital Signs Assessment: post-procedure vital signs reviewed and stable Respiratory status: spontaneous breathing, nonlabored ventilation, respiratory function stable and patient connected to nasal cannula oxygen Cardiovascular status: blood pressure returned to baseline and stable Postop Assessment: no signs of nausea or vomiting and spinal receding Anesthetic complications: no Comments: Patient with partially working spinal so GA was induced. Doing great post op    Last Vitals:  Filed Vitals:   12/09/15 1212 12/09/15 1303  BP: 125/59 125/72  Pulse: 84 76  Temp: 36.4 C 36.4 C  Resp: 16 18    Last Pain:  Filed Vitals:   12/09/15 1304  PainSc: 4                  Zenaida Deed

## 2015-12-09 NOTE — H&P (View-Only) (Signed)
TOTAL KNEE ADMISSION H&P  Patient is being admitted for right unicompartmental knee arthroplasty.  Subjective:  Chief Complaint:right knee pain.  HPI: [name], 60 y.o. female, has a history of pain and functional disability in the right knee due to arthritis and has failed non-surgical conservative treatments for greater than 12 weeks to includecorticosteriod injections, viscosupplementation injections and activity modification.  Onset of symptoms was gradual,  with gradually worsening course since that time. [name]'s had problems with that right knee for over a year and a half now. She seen Dr. Vickey Huger and had cortisone and viscosupplement injections. Unfortunately, she did not have much benefit from those. The knee is hurting her at most times. Something which can and cannot do. Pain is isolated to the medial side of her knee. She does not get swelling. . Patient has worsening of pain with activity and weight bearing and pain that interferes with activities of daily living.   There is no active infection. She has got medial compartment arthritis with significant pain and dysfunction. She has had cortisone and viscosupplement injections without benefit. At this point, the most predictable means of getting her better, short and long-term would be a unicompartmental arthroplasty. We did discuss this in detail and she would like to proceed.  Patient Active Problem List   Diagnosis Date Noted  . Acute bronchitis 09/19/2015  . External hemorrhoid, thrombosed 03/02/2014  . Thyroid cancer ([place]) 05/04/2011  . PALPITATIONS, RECURRENT 10/14/2010  . BENIGN POSITIONAL VERTIGO 07/30/2010  . EXOGENOUS OBESITY 08/14/2009  . COLONIC POLYPS, HYPERPLASTIC, HX OF 04/17/2008  . HYPOTHYROIDISM 11/24/2007  . HYPERLIPIDEMIA 11/24/2007  . GERD 11/24/2007  . HIATAL HERNIA 11/24/2007   Past Medical History  Diagnosis Date  . Barrett's esophagus without dysplasia     pt says was suspected never confirmed 9  or 10 years ago  . GERD (gastroesophageal reflux disease)   . Fundic gland polyps of stomach, benign   . Internal hemorrhoids   . Hyperlipidemia     borderline  . History of hiatal hernia   . Allergic rhinitis   . History of thyroid cancer     05-01-2011  s/p  total thyroidectomy for multinodular goiter and papillary carcinoma  . Hypothyroidism, postsurgical   . Esophagitis   . Lumbar spondylosis     L4 -- S1  . History of ovarian cyst   . TMJ (temporomandibular joint disorder)   . PONV (postoperative nausea and vomiting)     nausea with pain meds    Past Surgical History  Procedure Laterality Date  . Total thyroidectomy  05-01-2011  . Hemorrhoid surgery    . Appendectomy  01-30-2008  . Laparoscopic tubal ligation  07-21-2002    W/   Dilatation and evacuation  . Transobturator sling  01-07-2005    W/ CYSTOCELE REPAIR  . Upper gastrointestinal endoscopy  last one 06-29-2014  . Colonoscopy  08-08-2007  . Laparoscopic right oophorectomy  1996  . Left shoulder closed manipulation/  arthroscopic debridement labrum/  sad/ release  ca ligament/ bursectomy  10-12-2011  . Carpal tunnel release Right 08/30/2014    Procedure: RIGHT CARPAL TUNNEL RELEASE;  Surgeon: Linna Hoff, MD;  Location: [facility];  Service: Orthopedics;  Laterality: Right;     (Not in a hospital admission) Allergies  Allergen Reactions  . Celestone [Betamethasone Sodium Phosphate] Other (See Comments)    "Feels hot and looks sunburned"  . Droperidol Other (See Comments)    "feels like I have  withdrawal symptoms"  . Omni-Pac [Cefdinir] Diarrhea and Other (See Comments)    Other Reaction: Other reaction  . Statins Other (See Comments)    "joint stiffness"  . Hydrocodone Itching and Rash    Social History  Substance Use Topics  . Smoking status: Former Smoker -- 5 years    Types: Cigarettes    Start date: 09/28/1984    Quit date: 08/28/1985  . Smokeless tobacco: Never Used  . Alcohol  Use: Yes     Comment: rarely    Family History  Problem Relation Age of Onset  . Diabetes Father   . Cancer Father     prostate  . Thyroid disease Mother     hyperparathyroidism  . Thyroid disease Maternal Grandmother   . Esophageal cancer Neg Hx   . Colon cancer Neg Hx      Review of Systems  Constitutional: Negative.   Respiratory: Negative.   Cardiovascular: Negative.   Gastrointestinal: Negative.   Genitourinary: Negative.   Musculoskeletal: Positive for joint pain.    Objective:  Physical Exam  Constitutional: She appears well-developed and well-nourished.  Eyes: EOM are normal. Pupils are equal, round, and reactive to light.  Neck: Neck supple.  Cardiovascular: Normal rate and regular rhythm.   Respiratory: Breath sounds normal.  GI: Soft.  Musculoskeletal:  Her hips show normal range of motion of discomfort. Left knee, no effusion. Range 0 to 135. No tenderness or instability. The right knee shows range about 0 to 130. Moderate crepitus and range of motion. She is very tender medially. There is no lateral tenderness or instability noted. Her pulse sensation, motor intact distally.    Vital signs Weight: 173 lb Height: 66in Body Surface Area: 1.88 m Body Mass Index: 27.92 kg/m  BP: 118/70 (Sitting, Left Arm, Standard)  Estimated body mass index is 26.53 kg/(m^2) as calculated from the following:   Height as of 12/03/15: 5' 7.5" (1.715 m).   Weight as of 12/03/15: 78.019 kg (172 lb).   Imaging Review RADIOGRAPHS AP both knees and lateral show that she is isolated medial compartment arthritis just about bone-on-bone. No patellofemoral or lateral involvement. Assessment/Plan:  Medial compartment arthritis, right knee   The patient history, physical examination, clinical judgment of the provider and imaging studies are consistent with medial compartmental arthritis of the right knee(s) and unicompartmental knee arthroplasty is deemed medically necessary.  The treatment options including medical management, injection therapy arthroscopy and arthroplasty were discussed at length. The risks and benefits of total knee arthroplasty were presented and reviewed. The risks due to aseptic loosening, infection, stiffness, patella tracking problems, thromboembolic complications and other imponderables were discussed. The patient acknowledged the explanation, agreed to proceed with the plan and consent was signed. Patient is being admitted for inpatient treatment for surgery, pain control, PT, OT, prophylactic antibiotics, VTE prophylaxis, progressive ambulation and ADL's and discharge planning. The patient is planning to be discharged home with home health services

## 2015-12-09 NOTE — Interval H&P Note (Signed)
History and Physical Interval Note:  12/09/2015 6:50 AM  [name]  has presented today for surgery, with the diagnosis of RIGHT KNEE MEDIAL COMPARTMENT OA   The various methods of treatment have been discussed with the patient and family. After consideration of risks, benefits and other options for treatment, the patient has consented to  Procedure(s): RIGHT KNEE MEDIAL UNICOMPARTMENTAL ARTHROPLASTY  (Right) as a surgical intervention .  The patient's history has been reviewed, patient examined, no change in status, stable for surgery.  I have reviewed the patient's chart and labs.  Questions were answered to the patient's satisfaction.     Gearlean Alf

## 2015-12-09 NOTE — Op Note (Signed)
OPERATIVE REPORT  PREOPERATIVE DIAGNOSIS: Medial compartment osteoarthritis, Right knee  POSTOPERATIVE DIAGNOSIS: Medial compartment osteoarthritis, Right knee  PROCEDURE:Right knee medial unicompartmental arthroplasty.   SURGEON: Gaynelle Arabian, MD   ASSISTANT: Ardeen Jourdain, PA-C  ANESTHESIA:  General.   ESTIMATED BLOOD LOSS: Minimal.   DRAINS: Hemovac x1.   TOURNIQUET TIME: 34 minutes at 300 mmHg.   COMPLICATIONS: None.   CONDITION: Stable to recovery.   BRIEF CLINICAL NOTE:[name] is a 60 y.o. female, who has  significant isolated medial compartment arthritis of the Right knee. She has had nonoperative management including injections. She has had  cortisone. Unfortunately, the pain persists.  Radiograph showed isolated medial compartment bone-on-bone arthritis  with normal-appearing patellofemoral and lateral compartments. She  presents now for left knee unicompartmental arthroplasty.   PROCEDURE IN DETAIL: After successful administration of  General anesthetic, a tourniquet was placed high on the  Right thigh and right lower extremity prepped and draped in usual sterile fashion. Extremity was wrapped in an Esmarch, knee flexed, and tourniquet inflated to 300 mmHg. A midline incision was made with a 10 blade through subcutaneous  tissue to the extensor mechanism. A fresh blade was used to make a  medial parapatellar arthrotomy. Soft tissue on the proximal medial  tibia subperiosteally elevated to the joint line with a knife and into  the semimembranosus bursa with a Cobb elevator. The patella was  subluxed laterally, and the knee flexed 90 degrees. The ACL was intact.  The marginal osteophytes on the medial femur and tibia were removed with  a rongeur. The medial meniscus was also removed. The femoral cutting  block where the conformis unicompartmental knee system was placed along  the femur. There was excellent fit. I traced the outline. We  then  removed any remaining cartilage within this outline. We then placed the  cutting block again and pinned in position. The posterior femoral cut  was made, it was approximately 5 mm. The lug holes for the femoral  component were then drilled through the cutting block. The cutting  block was subsequently removed. We then utilized the high speed burr to  create a small trough at the superior aspect of the components that of  the inset and would not overhang the cartilage. The trial was placed,  it had excellent fit. The trial was subsequently removed.       The trial was placed again and the B chip was placed. There was  excellent balance throughout full motion. Also with excellent fit on  her tibia. This was removed as was the femoral trial. A curette was  used to remove any remaining cartilage from the tibia. The tibial  cutting block was then placed and there was a perfect fit on the tibial  surface. The appropriate slope was placed and it was pinned in  position. The reciprocating saw was used to make the central cut and  then the oscillating saw used to make the horizontal cut. The bone  fragment was then removed. The tibial trial was placed and had perfect  fit on the tibia. We then drilled the 2 lug holes and did the keel punch.  We then placed tibia trial femur, and a 6 mm trial insert. There was  excellent stability throughout full range of motion and no impingement.  The trial was then removed. We drilled small holes in the distal  femur in order to create more conduits for the cement. The cut bone  surfaces were thoroughly irrigated with  pulsatile lavage while the  cement was mixed on the back table. We then cemented the tibial  component into place, impacted it and removed the extruded cement. The  same was done for the femoral component. Trial 6-mm inserts placed,  knee held in full extension, and all extruded cement removed. While the  cement was hardening, I injected the  extensor mechanism, periosteum of  the femur and subcu tissues, a total of 20 mL of Exparel mixed with 30  mL of saline and then did an additional injection of 20 mL of 0.25%  Marcaine into the same tissues. When the cement had fully hardened,  then the permanent polyethylene was placed in tibial tray. There was  excellent stability throughout full range of motion with no lift off the  component and no evidence of any impingement. Wound was copiously  irrigated with saline solution, and the arthrotomy closed over a Hemovac  drain with a running #1 V-Loc suture. The subcutaneous was closed with  interrupted 2-0 Vicryl and subcuticular running 4-0 Monocryl. The drain  was hooked to suction. Incision cleaned and dried and Steri-Strips and  a bulky sterile dressing applied. The tourniquet was released after a  total time of 34 minutes. This was done after closing the extensor  mechanism. The wound was closed and a bulky sterile dressing was  applied. She was placed into a knee immobilizer, awakened and  transported to recovery room in stable condition.  Please note that a surgical assistant was a medical necessity for this  procedure in order to perform it in a safe and expeditious manner.  Assistance was necessary for retracting vital ligaments, neurovascular  structures, as well as for proper positioning of the limb to allow for  appropriate bone cuts and appropriate placement of the prosthesis.    Dione Plover Billee Balcerzak, MD

## 2015-12-09 NOTE — Anesthesia Procedure Notes (Addendum)
Spinal Patient location during procedure: OR Start time: 12/09/2015 7:07 AM End time: 12/09/2015 7:14 AM Staffing Anesthesiologist: Jillyn Hidden Resident/CRNA: KEY, KRISTOPHER Performed by: resident/CRNA and anesthesiologist  Spinal Block Patient position: sitting Prep: ChloraPrep Patient monitoring: heart rate, cardiac monitor, continuous pulse ox and blood pressure Approach: midline Location: L4-5 Injection technique: single-shot Needle Needle type: Quincke  Needle gauge: 22 G Additional Notes Functioning IV was confirmed and monitors were applied. Sterile prep and drape, including hand hygiene, mask and sterile gloves were used. The patient was positioned and the spine was prepped. The skin was anesthetized with lidocaine.  Free flow of clear CSF was obtained prior to injecting local anesthetic into the CSF.  The spinal needle aspirated freely following injection.  The needle was carefully withdrawn.  The patient tolerated the procedure well. Consent was obtained prior to procedure with all questions answered and concerns addressed.  [place] Pink, MD

## 2015-12-09 NOTE — Evaluation (Signed)
Physical Therapy Evaluation Patient Details Name: [name] MRN: [id number] DOB: 1956/05/31 Today's Date: 12/09/2015   History of Present Illness  Pt is a 60 year old female s/p right knee medial unicompartmental arthroplasty  Clinical Impression  Patient is s/p above surgery resulting in functional limitations due to the deficits listed below (see PT Problem List).  Patient will benefit from skilled PT to increase their independence and safety with mobility to allow discharge to the venue listed below.  Pt ambulated short distance however required recliner due to feeling lightheaded/woozy.  Pt plans to d/c home with spouse and assist from daughter.     Follow Up Recommendations Home health PT    Equipment Recommendations  Rolling walker with 5" wheels    Recommendations for Other Services       Precautions / Restrictions Precautions Precautions: Knee;Fall Required Braces or Orthoses: Knee Immobilizer - Right Restrictions Other Position/Activity Restrictions: WBAT      Mobility  Bed Mobility Overal bed mobility: Needs Assistance Bed Mobility: Supine to Sit     Supine to sit: Supervision;HOB elevated     General bed mobility comments: supervision for lines, cues for self assist  Transfers Overall transfer level: Needs assistance Equipment used: Rolling walker (2 wheeled) Transfers: Sit to/from Stand Sit to Stand: Min assist         General transfer comment: verbal cues for UE and LE positioning, assist to control descent  Ambulation/Gait Ambulation/Gait assistance: Min assist Ambulation Distance (Feet): 40 Feet Assistive device: Rolling walker (2 wheeled) Gait Pattern/deviations: Step-to pattern;Antalgic     General Gait Details: verbal cues for sequence, RW positioning, step length, posture, pt reported "wooziness" so recliner brought behind pt for safety, SpO2 86% upon return to room on room air so reapplied 2L O2 [place]  Stairs             Wheelchair Mobility    Modified Rankin (Stroke Patients Only)       Balance                                             Pertinent Vitals/Pain Pain Assessment: 0-10 Pain Score: 4  Pain Location: R knee Pain Descriptors / Indicators: Sore;Aching Pain Intervention(s): Limited activity within patient's tolerance;Monitored during session;Repositioned;Premedicated before session;Ice applied    Home Living Family/patient expects to be discharged to:: Private residence Living Arrangements: Spouse/significant other Available Help at Discharge: Family (daughter to assist on d/c)   Home Access: Stairs to enter Entrance Stairs-Rails: Right Entrance Stairs-Number of Steps: 6 Home Layout: Able to live on main level with bedroom/bathroom Home Equipment: Cane - single point      Prior Function Level of Independence: Independent               Hand Dominance        Extremity/Trunk Assessment               Lower Extremity Assessment: RLE deficits/detail RLE Deficits / Details: unable to perform SLR, ROM TBA       Communication   Communication: No difficulties  Cognition Arousal/Alertness: Awake/alert Behavior During Therapy: WFL for tasks assessed/performed Overall Cognitive Status: Within Functional Limits for tasks assessed                      General Comments      Exercises  Assessment/Plan    PT Assessment Patient needs continued PT services  PT Diagnosis Difficulty walking;Acute pain   PT Problem List Decreased strength;Decreased range of motion;Decreased mobility;Decreased knowledge of use of DME;Pain  PT Treatment Interventions Functional mobility training;Gait training;DME instruction;Stair training;Patient/family education;Therapeutic activities;Therapeutic exercise   PT Goals (Current goals can be found in the Care Plan section) Acute Rehab PT Goals PT Goal Formulation: With patient Time For Goal  Achievement: 12/13/15 Potential to Achieve Goals: Good    Frequency 7X/week   Barriers to discharge        Co-evaluation               End of Session Equipment Utilized During Treatment: Gait belt;Right knee immobilizer Activity Tolerance: Patient tolerated treatment well Patient left: in chair;with call bell/phone within reach;with chair alarm set      Functional Assessment Tool Used: clinical judgement Functional Limitation: Mobility: Walking and moving around Mobility: Walking and Moving Around Current Status [id number]): At least 20 percent but less than 40 percent impaired, limited or restricted Mobility: Walking and Moving Around Goal Status [phone]): At least 1 percent but less than 20 percent impaired, limited or restricted    Time: [id number] PT Time Calculation (min) (ACUTE ONLY): 17 min   Charges:   PT Evaluation $PT Eval Low Complexity: 1 Procedure     PT G Codes:   PT G-Codes **NOT FOR INPATIENT CLASS** Functional Assessment Tool Used: clinical judgement Functional Limitation: Mobility: Walking and moving around Mobility: Walking and Moving Around Current Status [id number]): At least 20 percent but less than 40 percent impaired, limited or restricted Mobility: Walking and Moving Around Goal Status [phone]): At least 1 percent but less than 20 percent impaired, limited or restricted    Levaughn Puccinelli,KATHrine E 12/09/2015, 4:45 PM Carmelia Bake, PT, DPT 12/09/2015 Pager: [phone]

## 2015-12-10 DIAGNOSIS — M1711 Unilateral primary osteoarthritis, right knee: Secondary | ICD-10-CM | POA: Diagnosis not present

## 2015-12-10 LAB — BASIC METABOLIC PANEL
Anion gap: 9 (ref 5–15)
BUN: 10 mg/dL (ref 6–20)
CO2: 27 mmol/L (ref 22–32)
Calcium: 8.5 mg/dL — ABNORMAL LOW (ref 8.9–10.3)
Chloride: 108 mmol/L (ref 101–111)
Creatinine, Ser: 0.63 mg/dL (ref 0.44–1.00)
GFR calc Af Amer: >60 mL/min (ref >60)
GFR calc non Af Amer: >60 mL/min (ref >60)
Glucose, Bld: 132 mg/dL — ABNORMAL HIGH (ref 65–99)
Potassium: 4.3 mmol/L (ref 3.5–5.1)
Sodium: 144 mmol/L (ref 135–145)

## 2015-12-10 LAB — CBC
HCT: 35.1 % — ABNORMAL LOW (ref 36.0–46.0)
Hemoglobin: 11.6 g/dL — ABNORMAL LOW (ref 12.0–15.0)
MCH: 29.3 pg (ref 26.0–34.0)
MCHC: 33 g/dL (ref 30.0–36.0)
MCV: 88.6 fL (ref 78.0–100.0)
Platelets: 246 10*3/uL (ref 150–400)
RBC: 3.96 MIL/uL (ref 3.87–5.11)
RDW: 13.1 % (ref 11.5–15.5)
WBC: 10.9 10*3/uL — ABNORMAL HIGH (ref 4.0–10.5)

## 2015-12-10 MED ORDER — HYDROMORPHONE HCL 2 MG PO TABS: 2.0000 mg | ORAL_TABLET | ORAL | Status: DC | PRN

## 2015-12-10 MED ORDER — HYDROMORPHONE HCL 2 MG PO TABS
1.0000 mg | ORAL_TABLET | ORAL | Status: DC | PRN
  Administered 2015-12-11: 1 mg via ORAL
  Filled 2015-12-10: qty 1

## 2015-12-10 MED ORDER — ALUM & MAG HYDROXIDE-SIMETH 200-200-20 MG/5ML PO SUSP: 30.0000 mL | ORAL | Status: DC | PRN

## 2015-12-10 MED ORDER — TRAMADOL HCL 50 MG PO TABS: 50.0000 mg | ORAL_TABLET | Freq: Four times a day (QID) | ORAL | Status: DC | PRN

## 2015-12-10 MED ORDER — NON FORMULARY: 20.0000 mg | Freq: Every day | Status: DC

## 2015-12-10 MED ORDER — METHOCARBAMOL 500 MG PO TABS: 500.0000 mg | ORAL_TABLET | Freq: Four times a day (QID) | ORAL | Status: DC | PRN

## 2015-12-10 MED ORDER — RIVAROXABAN 10 MG PO TABS: 10.0000 mg | ORAL_TABLET | Freq: Every day | ORAL | Status: DC

## 2015-12-10 MED ORDER — ESOMEPRAZOLE MAGNESIUM 20 MG PO CPDR
20.0000 mg | DELAYED_RELEASE_CAPSULE | Freq: Every day | ORAL | Status: DC
  Administered 2015-12-10: 20 mg via ORAL
  Filled 2015-12-10: qty 2

## 2015-12-10 NOTE — Progress Notes (Signed)
Physical Therapy Treatment Patient Details Name: [name] MRN: [id number] DOB: March 10, 1956 Today's Date: 12/10/2015    History of Present Illness Pt is a 60 year old female s/p right knee medial unicompartmental arthroplasty    PT Comments    POD # 1 am session.  Pt having increased c/o pain and HIGH anxiety.  Assisted OOB to [facility] required + 2 assist and much instruction on proper tech and safety with turns.  Pt reported 10/10 knee pain.  Unsteady and emotional.  Assisted with amb but only tolerated 10 feet vs 40 feet yesterday.  Recliner brought to pt.  Will return later to attempt stairs as pt has D/C to home orders for today.    Follow Up Recommendations  Home health PT     Equipment Recommendations  Rolling walker with 5" wheels    Recommendations for Other Services       Precautions / Restrictions Precautions Precautions: Knee;Fall Required Braces or Orthoses: Knee Immobilizer - Right Restrictions Weight Bearing Restrictions: No Other Position/Activity Restrictions: WBAT    Mobility  Bed Mobility Overal bed mobility: Needs Assistance Bed Mobility: Supine to Sit     Supine to sit: Min assist;Mod assist     General bed mobility comments: increased time and assist R LE with much anxiety  Transfers Overall transfer level: Needs assistance Equipment used: Rolling walker (2 wheeled) Transfers: Sit to/from Stand Sit to Stand: Min assist         General transfer comment: 75% VC's on proper tech and esp with hand placement.  Pt demon increased anxiety/fear of more pain then dizziness with activity.    Ambulation/Gait Ambulation/Gait assistance: Min assist;+2 safety/equipment Ambulation Distance (Feet): 10 Feet Assistive device: Rolling walker (2 wheeled) Gait Pattern/deviations: Step-to pattern;Decreased stance time - right;Trunk flexed     General Gait Details: tolerated decreased amb distance due to 10/10 R knee pain and inability to tolerate much WBing  despite instruction and use of walker.  HIGH ANXIETY.  Increased RR and c/o dizziness    Stairs            Wheelchair Mobility    Modified Rankin (Stroke Patients Only)       Balance                                    Cognition Arousal/Alertness: Awake/alert Behavior During Therapy: Anxious                        Exercises      General Comments        Pertinent Vitals/Pain Pain Assessment: 0-10 Pain Score: 10-Worst pain ever Pain Location: R knee with activity Pain Descriptors / Indicators: Grimacing;Crying;Tender Pain Intervention(s): Monitored during session;Premedicated before session;Repositioned;Ice applied    Home Living                      Prior Function            PT Goals (current goals can now be found in the care plan section) Progress towards PT goals: Progressing toward goals    Frequency  7X/week    PT Plan Current plan remains appropriate    Co-evaluation             End of Session Equipment Utilized During Treatment: Gait belt Activity Tolerance: Patient limited by fatigue;Patient limited by pain;Other (comment) (dizziness) Patient left: in  chair;with call bell/phone within reach;with chair alarm set     Time: 1205-1230 PT Time Calculation (min) (ACUTE ONLY): 25 min  Charges:  $Gait Training: 8-22 mins $Therapeutic Activity: 8-22 mins                    G Codes:      Rica Koyanagi  PTA [facility]  Acute  Rehab Pager      [phone]

## 2015-12-10 NOTE — Care Management Note (Signed)
Case Management Note  Patient Details  Name: [name] MRN: [id number] Date of Birth: 04-17-1956  Subjective/Objective:                  Medial compartment osteoarthritis, Right knee Action/Plan: Discharge planning Expected Discharge Date:  12/10/15               Expected Discharge Plan:  Home/Self Care  In-House Referral:     Discharge planning Services  CM Consult  Post Acute Care Choice:    Choice offered to:  Patient  DME Arranged:  N/A DME Agency:  NA  HH Arranged:  NA HH Agency:  NA  Status of Service:     Medicare Important Message Given:    Date Medicare IM Given:    Medicare IM give by:    Date Additional Medicare IM Given:    Additional Medicare Important Message give by:     If discussed at [place] of Stay Meetings, dates discussed:    Additional Comments: CM confirms with pt she is going straight to outpt therapy.  Pt has rolling wakler and commode with handles at home.  No other CM needs were communicated. Dellie Catholic, RN 12/10/2015, 10:55 AM

## 2015-12-10 NOTE — Evaluation (Signed)
Occupational Therapy Evaluation Patient Details Name: [name] MRN: [id number] DOB: Oct 19, 1955 Today's Date: 12/10/2015    History of Present Illness Pt is a 60 year old female s/p right knee medial unicompartmental arthroplasty   Clinical Impression   Pt is s/p R Uni  knee resulting in the deficits listed below (see OT Problem List).  Pt will benefit from skilled OT to increase their safety and independence with ADL and functional mobility for ADL to facilitate discharge to venue listed below.      Follow Up Recommendations  No OT follow up    Equipment Recommendations  3 in 1 bedside comode    Recommendations for Other Services       Precautions / Restrictions Precautions Precautions: Knee;Fall Required Braces or Orthoses: Knee Immobilizer - Right Restrictions Other Position/Activity Restrictions: WBAT      Mobility Bed Mobility               General bed mobility comments: pt not able due to pain  Transfers                 General transfer comment: NT         ADL Overall ADL's : Needs assistance/impaired                                       General ADL Comments: Upon initiating OOB pt began crying and stating her pain 10/10.  OT repositioned RLE as well as called Therapist, sports.  Pt stated she was unable to get OOB at this time. OT did educate pt on strategies to A with ADL activity with dressing and toileting. Pt does want a 3 n 1.                 Pertinent Vitals/Pain Pain Assessment: 0-10 Pain Score: 10-Worst pain ever Pain Location: r knee Pain Descriptors / Indicators: Sore;Aching Pain Intervention(s): Repositioned;Patient requesting pain meds-RN notified;Limited activity within patient's tolerance;Monitored during session     Hand Dominance     Extremity/Trunk Assessment Upper Extremity Assessment Upper Extremity Assessment: Generalized weakness;Overall WFL for tasks assessed           Communication  Communication Communication: No difficulties   Cognition Arousal/Alertness: Awake/alert Behavior During Therapy: WFL for tasks assessed/performed Overall Cognitive Status: Within Functional Limits for tasks assessed                                Home Living Family/patient expects to be discharged to:: Private residence Living Arrangements: Spouse/significant other Available Help at Discharge: Family (daughter to assist on d/c)   Home Access: Stairs to enter Technical brewer of Steps: 6 Entrance Stairs-Rails: Right Home Layout: Able to live on main level with bedroom/bathroom     Bathroom Shower/Tub: Tub/shower unit;Walk-in shower         Home Equipment: Kasandra Knudsen - single point          Prior Functioning/Environment Level of Independence: Independent             OT Diagnosis: Generalized weakness;Acute pain   OT Problem List: Decreased strength;Decreased activity tolerance;Pain   OT Treatment/Interventions: Self-care/ADL training;DME and/or AE instruction;Patient/family education    OT Goals(Current goals can be found in the care plan section) Acute Rehab OT Goals Patient Stated Goal: home when ready OT Goal Formulation: With patient Time For Goal  Achievement: 12/17/15 Potential to Achieve Goals: Good ADL Goals Pt Will Perform Lower Body Dressing: with supervision;sit to/from stand Pt Will Transfer to Toilet: with supervision;regular height toilet Pt Will Perform Toileting - Clothing Manipulation and hygiene: with supervision;sit to/from stand  OT Frequency: Min 2X/week   Barriers to D/C:               End of Session Nurse Communication: Mobility status;Patient requests pain meds  Activity Tolerance: Patient limited by pain Patient left: in bed;with call bell/phone within reach   Time: 0950-1005 OT Time Calculation (min): 15 min Charges:  OT General Charges $OT Visit: 1 Procedure OT Evaluation $OT Eval Low Complexity: 1  Procedure G-Codes: OT G-codes **NOT FOR INPATIENT CLASS** Functional Assessment Tool Used: clinical observation Functional Limitation: Self care Self Care Current Status [id number]): At least 60 percent but less than 80 percent impaired, limited or restricted Self Care Goal Status [id number]): At least 1 percent but less than 20 percent impaired, limited or restricted  [place], Thereasa Parkin 12/10/2015, 1:09 PM

## 2015-12-10 NOTE — Progress Notes (Signed)
Physical Therapy Treatment Patient Details Name: [name] MRN: [id number] DOB: December 22, 1955 Today's Date: 12/10/2015    History of Present Illness Pt is a 60 year old female s/p right knee medial unicompartmental arthroplasty    PT Comments    POD # 1 pm session. Returned to practice stairs however pt unable to perform due to pain level and anxiety.  Assisted out of recliner to [facility] to void very unsteady.  Amb a short distance with much difficulty tolerating WBing thru R LE enough to progress gait.  Max c/o dizziness.  BP standing 124/68.   Recliner brought to pt and notified RN.    Follow Up Recommendations  Home health PT     Equipment Recommendations  Rolling walker with 5" wheels    Recommendations for Other Services       Precautions / Restrictions Precautions Precautions: Knee;Fall Required Braces or Orthoses: Knee Immobilizer - Right Restrictions Weight Bearing Restrictions: No Other Position/Activity Restrictions: WBAT    Mobility  Bed Mobility Overal bed mobility: Needs Assistance Bed Mobility: Supine to Sit     Supine to sit: Min assist;Mod assist     General bed mobility comments: increased time and assist R LE with much anxiety  Transfers Overall transfer level: Needs assistance Equipment used: Rolling walker (2 wheeled) Transfers: Sit to/from Stand Sit to Stand: Min assist         General transfer comment: 75% VC's on proper tech and esp with hand placement.  Pt demon increased anxiety/fear of more pain then dizziness with activity.    Ambulation/Gait Ambulation/Gait assistance: Min assist;+2 safety/equipment Ambulation Distance (Feet): 10 Feet Assistive device: Rolling walker (2 wheeled) Gait Pattern/deviations: Step-to pattern;Decreased stance time - right;Trunk flexed     General Gait Details: tolerated decreased amb distance due to 10/10 R knee pain and inability to tolerate much WBing despite instruction and use of walker.  HIGH  ANXIETY.  Increased RR and c/o dizziness    Stairs            Wheelchair Mobility    Modified Rankin (Stroke Patients Only)       Balance                                    Cognition Arousal/Alertness: Awake/alert Behavior During Therapy: Anxious                        Exercises      General Comments        Pertinent Vitals/Pain Pain Assessment: 0-10 Pain Score: 10-Worst pain ever Pain Location: R knee with activity Pain Descriptors / Indicators: Grimacing;Crying;Tender Pain Intervention(s): Monitored during session;Premedicated before session;Repositioned;Ice applied    Home Living                      Prior Function            PT Goals (current goals can now be found in the care plan section) Progress towards PT goals: Progressing toward goals    Frequency  7X/week    PT Plan Current plan remains appropriate    Co-evaluation             End of Session Equipment Utilized During Treatment: Gait belt Activity Tolerance: Patient limited by fatigue;Patient limited by pain;Other (comment) (dizziness) Patient left: in chair;with call bell/phone within reach;with chair alarm set  Time: [id number] PT Time Calculation (min) (ACUTE ONLY): 25 min  Charges:  $Gait Training: 8-22 mins $Therapeutic Activity: 8-22 mins                    G Codes:      Rica Koyanagi  PTA [facility]  Acute  Rehab Pager      [phone]

## 2015-12-10 NOTE — Progress Notes (Signed)
   Subjective: 1 Day Post-Op Procedure(s) (LRB): RIGHT KNEE MEDIAL UNICOMPARTMENTAL ARTHROPLASTY  (Right) Patient reports pain as mild.   Patient seen in rounds with Dr. Wynelle Link. She walked 40 feet day of surgery. Patient is well, and has had no acute complaints or problems Patient is ready to go home following therapy.  Objective: Vital signs in last 24 hours: Temp:  [97.6 F (36.4 C)-98.1 F (36.7 C)] 97.8 F (36.6 C) (03/14 0522) Pulse Rate:  [72-99] 72 (03/14 0522) Resp:  [12-21] 16 (03/14 0522) BP: (117-137)/(58-83) 118/76 mmHg (03/14 0522) SpO2:  [96 %-100 %] 96 % (03/14 0522)  Intake/Output from previous day:  Intake/Output Summary (Last 24 hours) at 12/10/15 0854 Last data filed at 12/10/15 0600  Gross per 24 hour  Intake 3413.75 ml  Output   2840 ml  Net 573.75 ml    Labs:  Recent Labs  12/10/15 0402  HGB 11.6*    Recent Labs  12/10/15 0402  WBC 10.9*  RBC 3.96  HCT 35.1*  PLT 246    Recent Labs  12/10/15 0402  NA 144  K 4.3  CL 108  CO2 27  BUN 10  CREATININE 0.63  GLUCOSE 132*  CALCIUM 8.5*   No results for input(s): LABPT, INR in the last 72 hours.  EXAM: General - Patient is Alert, Appropriate and Oriented Extremity - Neurovascular intact Sensation intact distally Dorsiflexion/Plantar flexion intact Dressing - clean, dry, no drainage Motor Function - intact, moving foot and toes well on exam.  Hemovac pulled without difficulty.  Assessment/Plan: 1 Day Post-Op Procedure(s) (LRB): RIGHT KNEE MEDIAL UNICOMPARTMENTAL ARTHROPLASTY  (Right) Procedure(s) (LRB): RIGHT KNEE MEDIAL UNICOMPARTMENTAL ARTHROPLASTY  (Right) Past Medical History  Diagnosis Date  . Barrett's esophagus without dysplasia     pt says was suspected never confirmed 9 or 10 years ago  . GERD (gastroesophageal reflux disease)   . Fundic gland polyps of stomach, benign   . Internal hemorrhoids   . Hyperlipidemia     borderline  . History of hiatal hernia   .  Allergic rhinitis   . History of thyroid cancer     05-01-2011  s/p  total thyroidectomy for multinodular goiter and papillary carcinoma  . Hypothyroidism, postsurgical   . Esophagitis   . Lumbar spondylosis     L4 -- S1  . History of ovarian cyst   . TMJ (temporomandibular joint disorder)   . PONV (postoperative nausea and vomiting)     nausea with pain meds  . Hemorrhoids    Principal Problem:   OA (osteoarthritis) of knee  Estimated body mass index is 26.53 kg/(m^2) as calculated from the following:   Height as of this encounter: 5' 7.5" (1.715 m).   Weight as of this encounter: 78.019 kg (172 lb). Up with therapy Discharge home with home health Diet - Cardiac diet Follow up - in 2 weeks Activity - WBAT Dressing - May remove the surgical dressing tomorrow at home and then apply a dry gauze dressing daily. May shower three days following surgery but do not submerge the incision under water. Disposition - Home Condition Upon Discharge - Good D/C Meds - See DC Summary DVT Prophylaxis Xarelto 10 mg daily for ten days, then change to Aspirin 325 mg daily for two weeks, then reduce to Baby Aspirin 81 mg daily for three additional weeks.  Arlee Muslim, PA-C Orthopaedic Surgery 12/10/2015, 8:54 AM

## 2015-12-10 NOTE — Discharge Summary (Signed)
Physician Discharge Summary   Patient ID: [name] MRN: [id number] DOB/AGE: 59/08/1956 59 y.o.  Admit date: 12/09/2015 Discharge date: 12-10-2015  Primary Diagnosis:  Medial compartment osteoarthritis, Right knee  Admission Diagnoses:  Past Medical History  Diagnosis Date  . Barrett's esophagus without dysplasia     pt says was suspected never confirmed 9 or 10 years ago  . GERD (gastroesophageal reflux disease)   . Fundic gland polyps of stomach, benign   . Internal hemorrhoids   . Hyperlipidemia     borderline  . History of hiatal hernia   . Allergic rhinitis   . History of thyroid cancer     05-01-2011  s/p  total thyroidectomy for multinodular goiter and papillary carcinoma  . Hypothyroidism, postsurgical   . Esophagitis   . Lumbar spondylosis     L4 -- S1  . History of ovarian cyst   . TMJ (temporomandibular joint disorder)   . PONV (postoperative nausea and vomiting)     nausea with pain meds  . Hemorrhoids    Discharge Diagnoses:   Principal Problem:   OA (osteoarthritis) of knee  Estimated body mass index is 26.53 kg/(m^2) as calculated from the following:   Height as of this encounter: 5' 7.5" (1.715 m).   Weight as of this encounter: 78.019 kg (172 lb).  Procedure:  Procedure(s) (LRB): RIGHT KNEE MEDIAL UNICOMPARTMENTAL ARTHROPLASTY  (Right)   Consults: None  HPI: [name] is a 60 y.o. female, who has  significant isolated medial compartment arthritis of the Right knee. She has had nonoperative management including injections. She has had  cortisone. Unfortunately, the pain persists.  Radiograph showed isolated medial compartment bone-on-bone arthritis  with normal-appearing patellofemoral and lateral compartments. She  presents now for left knee unicompartmental arthroplasty.   Laboratory Data: Admission on 12/09/2015  Component Date Value Ref Range Status  . WBC 12/10/2015 10.9* 4.0 - 10.5 K/uL Final  . RBC 12/10/2015 3.96  3.87  - 5.11 MIL/uL Final  . Hemoglobin 12/10/2015 11.6* 12.0 - 15.0 g/dL Final  . HCT 12/10/2015 35.1* 36.0 - 46.0 % Final  . MCV 12/10/2015 88.6  78.0 - 100.0 fL Final  . MCH 12/10/2015 29.3  26.0 - 34.0 pg Final  . MCHC 12/10/2015 33.0  30.0 - 36.0 g/dL Final  . RDW 12/10/2015 13.1  11.5 - 15.5 % Final  . Platelets 12/10/2015 246  150 - 400 K/uL Final  . Sodium 12/10/2015 144  135 - 145 mmol/L Final  . Potassium 12/10/2015 4.3  3.5 - 5.1 mmol/L Final  . Chloride 12/10/2015 108  101 - 111 mmol/L Final  . CO2 12/10/2015 27  22 - 32 mmol/L Final  . Glucose, Bld 12/10/2015 132* 65 - 99 mg/dL Final  . BUN 12/10/2015 10  6 - 20 mg/dL Final  . Creatinine, Ser 12/10/2015 0.63  0.44 - 1.00 mg/dL Final  . Calcium 12/10/2015 8.5* 8.9 - 10.3 mg/dL Final  . GFR calc non Af Amer 12/10/2015 >60  >60 mL/min Final  . GFR calc Af Amer 12/10/2015 >60  >60 mL/min Final   Comment: (NOTE) The eGFR has been calculated using the CKD EPI equation. This calculation has not been validated in all clinical situations. eGFR's persistently <60 mL/min signify possible Chronic Kidney Disease.   Georgiann Hahn gap 12/10/2015 9  5 - 15 Final  Hospital Outpatient Visit on 12/03/2015  Component Date Value Ref Range Status  . aPTT 12/03/2015 34  24 - 37 seconds Final  .  WBC 12/03/2015 4.0  4.0 - 10.5 K/uL Final  . RBC 12/03/2015 4.50  3.87 - 5.11 MIL/uL Final  . Hemoglobin 12/03/2015 13.4  12.0 - 15.0 g/dL Final  . HCT 12/03/2015 40.8  36.0 - 46.0 % Final  . MCV 12/03/2015 90.7  78.0 - 100.0 fL Final  . MCH 12/03/2015 29.8  26.0 - 34.0 pg Final  . MCHC 12/03/2015 32.8  30.0 - 36.0 g/dL Final  . RDW 12/03/2015 13.0  11.5 - 15.5 % Final  . Platelets 12/03/2015 272  150 - 400 K/uL Final  . Sodium 12/03/2015 144  135 - 145 mmol/L Final  . Potassium 12/03/2015 4.5  3.5 - 5.1 mmol/L Final  . Chloride 12/03/2015 106  101 - 111 mmol/L Final  . CO2 12/03/2015 27  22 - 32 mmol/L Final  . Glucose, Bld 12/03/2015 102* 65 - 99 mg/dL  Final  . BUN 12/03/2015 14  6 - 20 mg/dL Final  . Creatinine, Ser 12/03/2015 0.68  0.44 - 1.00 mg/dL Final  . Calcium 12/03/2015 9.2  8.9 - 10.3 mg/dL Final  . Total Protein 12/03/2015 7.2  6.5 - 8.1 g/dL Final  . Albumin 12/03/2015 4.7  3.5 - 5.0 g/dL Final  . AST 12/03/2015 18  15 - 41 U/L Final  . ALT 12/03/2015 16  14 - 54 U/L Final  . Alkaline Phosphatase 12/03/2015 94  38 - 126 U/L Final  . Total Bilirubin 12/03/2015 0.7  0.3 - 1.2 mg/dL Final  . GFR calc non Af Amer 12/03/2015 >60  >60 mL/min Final  . GFR calc Af Amer 12/03/2015 >60  >60 mL/min Final   Comment: (NOTE) The eGFR has been calculated using the CKD EPI equation. This calculation has not been validated in all clinical situations. eGFR's persistently <60 mL/min signify possible Chronic Kidney Disease.   . Anion gap 12/03/2015 11  5 - 15 Final  . Prothrombin Time 12/03/2015 13.9  11.6 - 15.2 seconds Final  . INR 12/03/2015 1.10  0.00 - 1.49 Final  . ABO/RH(D) 12/03/2015 A POS   Final  . Antibody Screen 12/03/2015 NEG   Final  . Sample Expiration 12/03/2015 12/12/2015   Final  . Extend sample reason 12/03/2015 NO TRANSFUSIONS OR PREGNANCY IN THE PAST 3 MONTHS   Final  . Color, Urine 12/03/2015 YELLOW  YELLOW Final  . APPearance 12/03/2015 CLOUDY* CLEAR Final  . Specific Gravity, Urine 12/03/2015 1.018  1.005 - 1.030 Final  . pH 12/03/2015 5.5  5.0 - 8.0 Final  . Glucose, UA 12/03/2015 NEGATIVE  NEGATIVE mg/dL Final  . Hgb urine dipstick 12/03/2015 NEGATIVE  NEGATIVE Final  . Bilirubin Urine 12/03/2015 NEGATIVE  NEGATIVE Final  . Ketones, ur 12/03/2015 NEGATIVE  NEGATIVE mg/dL Final  . Protein, ur 12/03/2015 NEGATIVE  NEGATIVE mg/dL Final  . Nitrite 12/03/2015 NEGATIVE  NEGATIVE Final  . Leukocytes, UA 12/03/2015 TRACE* NEGATIVE Final  . MRSA, PCR 12/03/2015 NEGATIVE  NEGATIVE Final  . Staphylococcus aureus 12/03/2015 NEGATIVE  NEGATIVE Final   Comment:        The Xpert SA Assay (FDA approved for NASAL  specimens in patients over 62 years of age), is one component of a comprehensive surveillance program.  Test performance has been validated by [facility] for patients greater than or equal to 36 year old. It is not intended to diagnose infection nor to guide or monitor treatment.   . Squamous Epithelial / LPF 12/03/2015 6-30* NONE SEEN Final  . WBC, UA 12/03/2015 0-5  0 - 5 WBC/hpf Final  . RBC / HPF 12/03/2015 NONE SEEN  0 - 5 RBC/hpf Final  . Bacteria, UA 12/03/2015 NONE SEEN  NONE SEEN Final  . Urine-Other 12/03/2015 MUCOUS PRESENT   Final  . ABO/RH(D) 12/03/2015 A POS   Final  Abstract on 10/21/2015  Component Date Value Ref Range Status  . Hemoglobin 09/03/2015 13.7  12.0 - 16.0 g/dL Final  . HCT 09/03/2015 41  36 - 46 % Final  . Platelets 09/03/2015 257  150 - 399 K/L Final  . WBC 09/03/2015 4.8   Final  . Glucose 09/03/2015 91   Final  . BUN 09/03/2015 19  4 - 21 mg/dL Final  . Creatinine 09/03/2015 0.7  0.5 - 1.1 mg/dL Final  . Potassium 09/03/2015 4.5  3.4 - 5.3 mmol/L Final  . Sodium 09/03/2015 141  137 - 147 mmol/L Final  . Triglycerides 09/03/2015 58  40 - 160 mg/dL Final  . Cholesterol 09/03/2015 217* 0 - 200 mg/dL Final  . HDL 09/03/2015 58  35 - 70 mg/dL Final  . LDL Cholesterol 09/03/2015 147   Final  . Alkaline Phosphatase 09/03/2015 87  25 - 125 U/L Final  . ALT 09/03/2015 20  7 - 35 U/L Final  . AST 09/03/2015 22  13 - 35 U/L Final  . Hemoglobin A1C 09/03/2015 5.9   Final  . TSH 09/03/2015 1.15  .41 - 5.90 uIU/mL Final     X-Rays:No results found.  EKG: Orders placed or performed in visit on 10/17/15  . EKG 12-Lead     Hospital Course: TAMYKA BEZIO is a 60 y.o. who was admitted to [facility]. They were brought to the operating room on 12/09/2015 and underwent Procedure(s): RIGHT KNEE MEDIAL UNICOMPARTMENTAL ARTHROPLASTY .  Patient tolerated the procedure well and was later transferred to the recovery room and then to the orthopaedic  floor for postoperative care.  They were given PO and IV analgesics for pain control following their surgery.  They were given 24 hours of postoperative antibiotics of  Anti-infectives    Start     Dose/Rate Route Frequency Ordered Stop   12/09/15 1400  ceFAZolin (ANCEF) IVPB 2 g/50 mL premix     2 g 100 mL/hr over 30 Minutes Intravenous Every 6 hours 12/09/15 1011 12/09/15 2016   12/09/15 0619  ceFAZolin (ANCEF) IVPB 2 g/50 mL premix     2 g 100 mL/hr over 30 Minutes Intravenous On call to O.R. 12/09/15 6010 12/09/15 0720     and started on DVT prophylaxis in the form of Xarelto.   PT and OT were ordered for postop therapy protocol.  Discharge planning consulted to help with postop disposition and equipment needs.  Patient had a decent night on the evening of surgery.  They started to get up OOB with therapy on day one. Hemovac drain was pulled without difficulty.  Patient was seen in rounds on day one and it was felt that as long as they did well with the remaining sessions of therapy that they would be ready to go home.  Arrangements were made and they were setup to go home on POD 1.  Discharge home with home health Diet - Cardiac diet Follow up - in 2 weeks Activity - WBAT Dressing - May remove the surgical dressing tomorrow at home and then apply a dry gauze dressing daily. May shower three days following surgery but do not submerge the incision under water. Disposition - Home  Condition Upon Discharge - Good D/C Meds - See DC Summary DVT Prophylaxis Xarelto 10 mg daily for ten days, then change to Aspirin 325 mg daily for two weeks, then reduce to Baby Aspirin 81 mg daily for three additional weeks.  Discharge Instructions    Call MD / Call 911    Complete by:  As directed   If you experience chest pain or shortness of breath, CALL 911 and be transported to the hospital emergency room.  If you develope a fever above 101 F, pus (white drainage) or increased drainage or redness at the  wound, or calf pain, call your surgeon's office.     Change dressing    Complete by:  As directed   Change dressing daily with sterile 4 x 4 inch gauze dressing and apply TED hose. Do not submerge the incision under water.     Constipation Prevention    Complete by:  As directed   Drink plenty of fluids.  Prune juice may be helpful.  You may use a stool softener, such as Colace (over the counter) 100 mg twice a day.  Use MiraLax (over the counter) for constipation as needed.     Diet - low sodium heart healthy    Complete by:  As directed      Discharge instructions    Complete by:  As directed   Pick up stool softner and laxative for home use following surgery while on pain medications. Do not submerge incision under water. May remove the surgical dressing tomorrow, Wednesday 12/11/2015, and then apply a dry gauze dressing daily. Please use good hand washing techniques while changing dressing each day. May shower starting three days after surgery starting Thursday 12/12/2015. Please use a clean towel to pat the incision dry following showers. Continue to use ice for pain and swelling after surgery. Do not use any lotions or creams on the incision until instructed by your surgeon.  Postoperative Constipation Protocol  Constipation - defined medically as fewer than three stools per week and severe constipation as less than one stool per week.  One of the most common issues patients have following surgery is constipation. Even if you have a regular bowel pattern at home, your normal regimen is likely to be disrupted due to multiple reasons following surgery. Combination of anesthesia, postoperative narcotics, change in appetite and fluid intake all can affect your bowels. In order to avoid complications following surgery, here are some recommendations in order to help you during your recovery period.  Colace (docusate) - Pick up an over-the-counter form of Colace or another stool softener  and take twice a day as long as you are requiring postoperative pain medications. Take with a full glass of water daily. If you experience loose stools or diarrhea, hold the colace until you stool forms back up. If your symptoms do not get better within 1 week or if they get worse, check with your doctor.  Dulcolax (bisacodyl) - Pick up over-the-counter and take as directed by the product packaging as needed to assist with the movement of your bowels. Take with a full glass of water. Use this product as needed if not relieved by Colace only.   MiraLax (polyethylene glycol) - Pick up over-the-counter to have on hand. MiraLax is a solution that will increase the amount of water in your bowels to assist with bowel movements. Take as directed and can mix with a glass of water, juice, soda, coffee, or tea. Take if you go more  than two days without a movement. Do not use MiraLax more than once per day. Call your doctor if you are still constipated or irregular after using this medication for 7 days in a row.  If you continue to have problems with postoperative constipation, please contact the office for further assistance and recommendations. If you experience "the worst abdominal pain ever" or develop nausea or vomiting, please contact the office immediatly for further recommendations for treatment.  Xarelto 10 mg daily for ten days, then change to Aspirin 325 mg daily for two weeks, then reduce to Baby Aspirin 81 mg daily for three additional weeks.     Do not put a pillow under the knee. Place it under the heel.    Complete by:  As directed      Do not sit on low chairs, stoools or toilet seats, as it may be difficult to get up from low surfaces    Complete by:  As directed      Driving restrictions    Complete by:  As directed   No driving until released by the physician.     Increase activity slowly as tolerated    Complete by:  As directed      Lifting restrictions    Complete by:  As  directed   No lifting until released by the physician.     Patient may shower    Complete by:  As directed   You may shower without a dressing once there is no drainage.  Do not wash over the wound.  If drainage remains, do not shower until drainage stops.     TED hose    Complete by:  As directed   Use stockings (TED hose) for 3 weeks on both leg(s).  You may remove them at night for sleeping.     Weight bearing as tolerated    Complete by:  As directed   Laterality:  right  Extremity:  Lower            Medication List    STOP taking these medications        aspirin-acetaminophen-caffeine 250-250-65 MG tablet  Commonly known as:  EXCEDRIN MIGRAINE     CALTRATE 600+D PLUS 600-400 MG-UNIT per tablet     ergocalciferol 50000 units capsule  Commonly known as:  VITAMIN D2     ibuprofen 200 MG tablet  Commonly known as:  ADVIL,MOTRIN     naproxen sodium 220 MG tablet  Commonly known as:  ANAPROX      TAKE these medications        hydrocortisone cream 1 %  Apply 1 application topically 2 (two) times daily.     HYDROmorphone 2 MG tablet  Commonly known as:  DILAUDID  Take 1-2 tablets (2-4 mg total) by mouth every 4 (four) hours as needed for moderate pain or severe pain.     levothyroxine 125 MCG tablet  Commonly known as:  SYNTHROID, LEVOTHROID  Take 125 mcg by mouth daily before breakfast.     methocarbamol 500 MG tablet  Commonly known as:  ROBAXIN  Take 1 tablet (500 mg total) by mouth every 6 (six) hours as needed for muscle spasms.     esomeprazole 20 MG capsule  Commonly known as:  NEXIUM  Take 20-40 mg by mouth daily at 12 noon.     NEXIUM 40 MG capsule  Generic drug:  esomeprazole  Take 40 mg daily in the morning.     rivaroxaban 10  MG Tabs tablet  Commonly known as:  XARELTO  Take 1 tablet (10 mg total) by mouth daily with breakfast. Xarelto 10 mg daily for ten days, then change to Aspirin 325 mg daily for two weeks, then reduce to Baby Aspirin 81 mg  daily for three additional weeks.     traMADol 50 MG tablet  Commonly known as:  ULTRAM  Take 1-2 tablets (50-100 mg total) by mouth every 6 (six) hours as needed (mil dpain).           Follow-up Information    Follow up with Gearlean Alf, MD On 12/24/2015.   Specialty:  Orthopedic Surgery   Why:  Call office at [phone] to setup appointment on Tuesday 12/24/2015 with Dr. Wynelle Link.   Contact information:   [address] [place] [address] [phone]       Signed: Arlee Muslim, PA-C Orthopaedic Surgery 12/10/2015, 9:02 AM

## 2015-12-10 NOTE — Discharge Instructions (Addendum)
° °Dr. Frank Aluisio °Total Joint Specialist °[facility] °[address] °[place], [place] [address] °[phone] ° °UNI KNEE REPLACEMENT POSTOPERATIVE DIRECTIONS ° ° °Knee Rehabilitation, Guidelines Following Surgery  °Results after knee surgery are often greatly improved when you follow the exercise, range of motion and muscle strengthening exercises prescribed by your doctor. Safety measures are also important to protect the knee from further injury. Any time any of these exercises cause you to have increased pain or swelling in your knee joint, decrease the amount until you are comfortable again and slowly increase them. If you have problems or questions, call your caregiver or physical therapist for advice.  ° °HOME CARE INSTRUCTIONS  °Remove items at home which could result in a fall. This includes throw rugs or furniture in walking pathways.  °· ICE to the affected knee every three hours for 30 minutes at a time and then as needed for pain and swelling.  Continue to use ice on the knee for pain and swelling from surgery. You may notice swelling that will progress down to the foot and ankle.  This is normal after surgery.  Elevate the leg when you are not up walking on it.   °· Continue to use the breathing machine which will help keep your temperature down.  It is common for your temperature to cycle up and down following surgery, especially at night when you are not up moving around and exerting yourself.  The breathing machine keeps your lungs expanded and your temperature down. °· Do not place pillow under knee, focus on keeping the knee straight while resting ° °DIET °You may resume your previous home diet once your are discharged from the hospital. ° °DRESSING / WOUND CARE / SHOWERING °You may shower 3 days after surgery, but keep the wounds dry during showering.  You may use an occlusive plastic wrap (Press'n Seal for example), NO SOAKING/SUBMERGING IN THE BATHTUB.  If the  bandage gets wet, change with a clean dry gauze.  If the incision gets wet, pat the wound dry with a clean towel. °You may start showering once you are discharged home but do not submerge the incision under water. Just pat the incision dry and apply a dry gauze dressing on daily. °Change the surgical dressing daily and reapply a dry dressing each time. ° °ACTIVITY °Walk with your walker as instructed. °Use walker as long as suggested by your caregivers. °Avoid periods of inactivity such as sitting longer than an hour when not asleep. This helps prevent blood clots.  °You may resume a sexual relationship in one month or when given the OK by your doctor.  °You may return to work once you are cleared by your doctor.  °Do not drive a car for 6 weeks or until released by you surgeon.  °Do not drive while taking narcotics. ° °WEIGHT BEARING °Weight bearing as tolerated with assist device (walker, cane, etc) as directed, use it as long as suggested by your surgeon or therapist, typically at least 4-6 weeks. ° °POSTOPERATIVE CONSTIPATION PROTOCOL °Constipation - defined medically as fewer than three stools per week and severe constipation as less than one stool per week. ° °One of the most common issues patients have following surgery is constipation.  Even if you have a regular bowel pattern at home, your normal regimen is likely to be disrupted due to multiple reasons following surgery.  Combination of anesthesia, postoperative narcotics, change in appetite and fluid intake all can affect your bowels.    In order to avoid complications following surgery, here are some recommendations in order to help you during your recovery period.  Colace (docusate) - Pick up an over-the-counter form of Colace or another stool softener and take twice a day as long as you are requiring postoperative pain medications.  Take with a full glass of water daily.  If you experience loose stools or diarrhea, hold the colace until you stool forms  back up.  If your symptoms do not get better within 1 week or if they get worse, check with your doctor.  Dulcolax (bisacodyl) - Pick up over-the-counter and take as directed by the product packaging as needed to assist with the movement of your bowels.  Take with a full glass of water.  Use this product as needed if not relieved by Colace only.   MiraLax (polyethylene glycol) - Pick up over-the-counter to have on hand.  MiraLax is a solution that will increase the amount of water in your bowels to assist with bowel movements.  Take as directed and can mix with a glass of water, juice, soda, coffee, or tea.  Take if you go more than two days without a movement. Do not use MiraLax more than once per day. Call your doctor if you are still constipated or irregular after using this medication for 7 days in a row.  If you continue to have problems with postoperative constipation, please contact the office for further assistance and recommendations.  If you experience "the worst abdominal pain ever" or develop nausea or vomiting, please contact the office immediatly for further recommendations for treatment.  ITCHING  If you experience itching with your medications, try taking only a single pain pill, or even half a pain pill at a time.  You can also use Benadryl over the counter for itching or also to help with sleep.   TED HOSE STOCKINGS Wear the elastic stockings on both legs for three weeks following surgery during the day but you may remove then at night for sleeping.  MEDICATIONS See your medication summary on the After Visit Summary that the nursing staff will review with you prior to discharge.  You may have some home medications which will be placed on hold until you complete the course of blood thinner medication.  It is important for you to complete the blood thinner medication as prescribed by your surgeon.  Continue your approved medications as instructed at time of  discharge.  PRECAUTIONS If you experience chest pain or shortness of breath - call 911 immediately for transfer to the hospital emergency department.  If you develop a fever greater that 101 F, purulent drainage from wound, increased redness or drainage from wound, foul odor from the wound/dressing, or calf pain - CONTACT YOUR SURGEON.                                                   FOLLOW-UP APPOINTMENTS Make sure you keep all of your appointments after your operation with your surgeon and caregivers. You should call the office at the above phone number and make an appointment for approximately two weeks after the date of your surgery or on the date instructed by your surgeon outlined in the "After Visit Summary".  RANGE OF MOTION AND STRENGTHENING EXERCISES  Rehabilitation of the knee is important following a knee injury or an  operation. After just a few days of immobilization, the muscles of the thigh which control the knee become weakened and shrink (atrophy). Knee exercises are designed to build up the tone and strength of the thigh muscles and to improve knee motion. Often times heat used for twenty to thirty minutes before working out will loosen up your tissues and help with improving the range of motion but do not use heat for the first two weeks following surgery. These exercises can be done on a training (exercise) mat, on the floor, on a table or on a bed. Use what ever works the best and is most comfortable for you Knee exercises include:  °Leg Lifts - While your knee is still immobilized in a splint or cast, you can do straight leg raises. Lift the leg to 60 degrees, hold for 3 sec, and slowly lower the leg. Repeat 10-20 times 2-3 times daily. Perform this exercise against resistance later as your knee gets better.  °Quad and Hamstring Sets - Tighten up the muscle on the front of the thigh (Quad) and hold for 5-10 sec. Repeat this 10-20 times hourly. Hamstring sets are done by pushing the  foot backward against an object and holding for 5-10 sec. Repeat as with quad sets.  °· Leg Slides: Lying on your back, slowly slide your foot toward your buttocks, bending your knee up off the floor (only go as far as is comfortable). Then slowly slide your foot back down until your leg is flat on the floor again. °· Angel Wings: Lying on your back spread your legs to the side as far apart as you can without causing discomfort.  °A rehabilitation program following serious knee injuries can speed recovery and prevent re-injury in the future due to weakened muscles. Contact your doctor or a physical therapist for more information on knee rehabilitation.  ° °IF YOU ARE TRANSFERRED TO A SKILLED REHAB FACILITY °If the patient is transferred to a skilled rehab facility following release from the hospital, a list of the current medications will be sent to the facility for the patient to continue.  When discharged from the skilled rehab facility, please have the facility set up the patient's Home Health Physical Therapy prior to being released. Also, the skilled facility will be responsible for providing the patient with their medications at time of release from the facility to include their pain medication, the muscle relaxants, and their blood thinner medication. If the patient is still at the rehab facility at time of the two week follow up appointment, the skilled rehab facility will also need to assist the patient in arranging follow up appointment in our office and any transportation needs. ° °MAKE SURE YOU:  °Understand these instructions.  °Get help right away if you are not doing well or get worse.  ° ° °Pick up stool softner and laxative for home use following surgery while on pain medications. °Do not submerge incision under water. °Please use good hand washing techniques while changing dressing each day. °May shower starting three days after surgery. °Please use a clean towel to pat the incision dry following  showers. °Continue to use ice for pain and swelling after surgery. °Do not use any lotions or creams on the incision until instructed by your surgeon. ° °Take Xarelto 10 mg daily for ten days, then change to Aspirin 325 mg daily for two weeks, then reduce to Baby Aspirin 81 mg daily for three additional weeks. ° ° °________________________ ° °Information on my   my medicine - XARELTO (Rivaroxaban)  This medication education was reviewed with me or my healthcare representative as part of my discharge preparation.  The pharmacist that spoke with me during my hospital stay was:  Lolita Patella, [facility]  Why was Xarelto prescribed for you? Xarelto was prescribed for you to reduce the risk of blood clots forming after orthopedic surgery. The medical term for these abnormal blood clots is venous thromboembolism (VTE).  What do you need to know about xarelto ? Take your Xarelto ONCE DAILY at the same time every day. You may take it either with or without food.  If you have difficulty swallowing the tablet whole, you may crush it and mix in applesauce just prior to taking your dose.  Take Xarelto exactly as prescribed by your doctor and DO NOT stop taking Xarelto without talking to the doctor who prescribed the medication.  Stopping without other VTE prevention medication to take the place of Xarelto may increase your risk of developing a clot.  After discharge, you should have regular check-up appointments with your healthcare provider that is prescribing your Xarelto.    What do you do if you miss a dose? If you miss a dose, take it as soon as you remember on the same day then continue your regularly scheduled once daily regimen the next day. Do not take two doses of Xarelto on the same day.   Important Safety Information A possible side effect of Xarelto is bleeding. You should call your healthcare provider right away if you experience any of the following: ? Bleeding from an injury or your nose that does not  stop. ? Unusual colored urine (red or dark brown) or unusual colored stools (red or black). ? Unusual bruising for unknown reasons. ? A serious fall or if you hit your head (even if there is no bleeding).  Some medicines may interact with Xarelto and might increase your risk of bleeding while on Xarelto. To help avoid this, consult your healthcare provider or pharmacist prior to using any new prescription or non-prescription medications, including herbals, vitamins, non-steroidal anti-inflammatory drugs (NSAIDs) and supplements.  This website has more information on Xarelto: [email or web address]

## 2015-12-11 DIAGNOSIS — M1711 Unilateral primary osteoarthritis, right knee: Secondary | ICD-10-CM | POA: Diagnosis not present

## 2015-12-11 LAB — CBC
HCT: 35.9 % — ABNORMAL LOW (ref 36.0–46.0)
Hemoglobin: 11.9 g/dL — ABNORMAL LOW (ref 12.0–15.0)
MCH: 29.5 pg (ref 26.0–34.0)
MCHC: 33.1 g/dL (ref 30.0–36.0)
MCV: 89.1 fL (ref 78.0–100.0)
Platelets: 241 10*3/uL (ref 150–400)
RBC: 4.03 MIL/uL (ref 3.87–5.11)
RDW: 13.3 % (ref 11.5–15.5)
WBC: 8 10*3/uL (ref 4.0–10.5)

## 2015-12-11 LAB — BASIC METABOLIC PANEL
Anion gap: 8 (ref 5–15)
BUN: 9 mg/dL (ref 6–20)
CO2: 28 mmol/L (ref 22–32)
Calcium: 8.4 mg/dL — ABNORMAL LOW (ref 8.9–10.3)
Chloride: 105 mmol/L (ref 101–111)
Creatinine, Ser: 0.53 mg/dL (ref 0.44–1.00)
GFR calc Af Amer: >60 mL/min (ref >60)
GFR calc non Af Amer: >60 mL/min (ref >60)
Glucose, Bld: 113 mg/dL — ABNORMAL HIGH (ref 65–99)
Potassium: 3.9 mmol/L (ref 3.5–5.1)
Sodium: 141 mmol/L (ref 135–145)

## 2015-12-11 MED ORDER — METHOCARBAMOL 500 MG PO TABS: 500.0000 mg | ORAL_TABLET | Freq: Four times a day (QID) | ORAL | Status: DC | PRN

## 2015-12-11 MED ORDER — TRAMADOL HCL 50 MG PO TABS: 50.0000 mg | ORAL_TABLET | Freq: Four times a day (QID) | ORAL | Status: DC | PRN

## 2015-12-11 MED ORDER — NON FORMULARY: 20.0000 mg | Freq: Once | Status: DC

## 2015-12-11 MED ORDER — RIVAROXABAN 10 MG PO TABS: 10.0000 mg | ORAL_TABLET | Freq: Every day | ORAL | Status: DC

## 2015-12-11 MED ORDER — HYDROMORPHONE HCL 2 MG PO TABS: 1.0000 mg | ORAL_TABLET | ORAL | Status: DC | PRN

## 2015-12-11 MED ORDER — ESOMEPRAZOLE MAGNESIUM 20 MG PO CPDR
20.0000 mg | DELAYED_RELEASE_CAPSULE | Freq: Once | ORAL | Status: AC
  Administered 2015-12-11: 20 mg via ORAL
  Filled 2015-12-11: qty 2

## 2015-12-11 MED ORDER — ONDANSETRON HCL 4 MG PO TABS: 4.0000 mg | ORAL_TABLET | Freq: Four times a day (QID) | ORAL | Status: DC | PRN

## 2015-12-11 NOTE — Progress Notes (Addendum)
[facility]    [facility] is providing the following services: RW and commode  If patient discharges after hours, please call [phone].   Linward Headland 12/11/2015, 1:48 PM

## 2015-12-11 NOTE — Progress Notes (Signed)
PT/OT contacted CM to add on DME: 3n1 and rolling walker.  CM confirmed with pt and called [facility] DME rep, Lecretia to please deliver the DME prior to discharge.  No other CM needs were communicated.

## 2015-12-11 NOTE — Progress Notes (Signed)
   Subjective: 2 Days Post-Op Procedure(s) (LRB): RIGHT KNEE MEDIAL UNICOMPARTMENTAL ARTHROPLASTY  (Right) Patient reports pain as mild and moderate.   Patient seen in rounds for Dr. Wynelle Link. Patient is well, but has had some minor complaints of pain in the knee, requiring pain medications Patient is ready to go home today  Objective: Vital signs in last 24 hours: Temp:  [98.5 F (36.9 C)-99.1 F (37.3 C)] 98.7 F (37.1 C) (03/15 0433) Pulse Rate:  [71-81] 81 (03/15 0433) Resp:  [16-18] 16 (03/15 0433) BP: (121-134)/(74-83) 131/79 mmHg (03/15 0433) SpO2:  [95 %-99 %] 95 % (03/15 0433)  Intake/Output from previous day:  Intake/Output Summary (Last 24 hours) at 12/11/15 1043 Last data filed at 12/11/15 0836  Gross per 24 hour  Intake   1060 ml  Output   1175 ml  Net   -115 ml    Intake/Output this shift: Total I/O In: 240 [P.O.:240] Out: -   Labs:  Recent Labs  12/10/15 0402 12/11/15 0416  HGB 11.6* 11.9*    Recent Labs  12/10/15 0402 12/11/15 0416  WBC 10.9* 8.0  RBC 3.96 4.03  HCT 35.1* 35.9*  PLT 246 241    Recent Labs  12/10/15 0402 12/11/15 0416  NA 144 141  K 4.3 3.9  CL 108 105  CO2 27 28  BUN 10 9  CREATININE 0.63 0.53  GLUCOSE 132* 113*  CALCIUM 8.5* 8.4*   No results for input(s): LABPT, INR in the last 72 hours.  EXAM: General - Patient is Alert and Appropriate Extremity - Neurovascular intact Sensation intact distally Incision - clean, dry Motor Function - intact, moving foot and toes well on exam.   Assessment/Plan: 2 Days Post-Op Procedure(s) (LRB): RIGHT KNEE MEDIAL UNICOMPARTMENTAL ARTHROPLASTY  (Right) Procedure(s) (LRB): RIGHT KNEE MEDIAL UNICOMPARTMENTAL ARTHROPLASTY  (Right) Past Medical History  Diagnosis Date  . Barrett's esophagus without dysplasia     pt says was suspected never confirmed 9 or 10 years ago  . GERD (gastroesophageal reflux disease)   . Fundic gland polyps of stomach, benign   . Internal  hemorrhoids   . Hyperlipidemia     borderline  . History of hiatal hernia   . Allergic rhinitis   . History of thyroid cancer     05-01-2011  s/p  total thyroidectomy for multinodular goiter and papillary carcinoma  . Hypothyroidism, postsurgical   . Esophagitis   . Lumbar spondylosis     L4 -- S1  . History of ovarian cyst   . TMJ (temporomandibular joint disorder)   . PONV (postoperative nausea and vomiting)     nausea with pain meds  . Hemorrhoids    Principal Problem:   OA (osteoarthritis) of knee  Estimated body mass index is 26.53 kg/(m^2) as calculated from the following:   Height as of this encounter: 5' 7.5" (1.715 m).   Weight as of this encounter: 78.019 kg (172 lb). Discharge home with home health Diet - Cardiac diet Follow up - in 2 weeks Activity - WBAT Disposition - Home Condition Upon Discharge - Stable D/C Meds - See DC Summary DVT Prophylaxis - Xarelto  Arlee Muslim, PA-C Orthopaedic Surgery 12/11/2015, 10:43 AM

## 2015-12-11 NOTE — Progress Notes (Signed)
Occupational Therapy Treatment Patient Details Name: [name] MRN: [id number] DOB: 05/20/1956 Today's Date: 12/11/2015    History of present illness Pt is a 60 year old female s/p right knee medial unicompartmental arthroplasty   OT comments  Patient much better today. Pain controlled, not anxious. Able to walk to/from bathroom with RW and practice toilet and shower transfers. Reports she did her own sponge bath today. All OT education completed. Will sign off.  Follow Up Recommendations  No OT follow up    Equipment Recommendations  3 in 1 bedside comode    Recommendations for Other Services      Precautions / Restrictions Precautions Precautions: Knee;Fall Required Braces or Orthoses: Knee Immobilizer - Right Restrictions Weight Bearing Restrictions: No Other Position/Activity Restrictions: WBAT       Mobility Bed Mobility Overal bed mobility: Needs Assistance Bed Mobility: Supine to Sit;Sit to Supine     Supine to sit: Modified independent (Device/Increase time) Sit to supine: Modified independent (Device/Increase time)   General bed mobility comments: used LLE to assist RLE  Transfers Overall transfer level: Needs assistance Equipment used: Rolling walker (2 wheeled) Transfers: Sit to/from [facility] Sit to Stand: Min guard Stand pivot transfers: Supervision            Balance                                   ADL Overall ADL's : Needs assistance/impaired Eating/Feeding: Independent   Grooming: Set up;Sitting   Upper Body Bathing: Set up;Sitting   Lower Body Bathing: Minimal assistance;Sit to/from stand   Upper Body Dressing : Set up;Sitting   Lower Body Dressing: Minimal assistance;Sit to/from stand   Toilet Transfer: Min guard;Stand-pivot;BSC;RW   Toileting- Water quality scientist and Hygiene: Independent;Sitting/lateral lean   Tub/ Shower Transfer: Walk-in shower;Min guard;Ambulation;Rolling walker    Functional mobility during ADLs: Min guard;Supervision/safety;Rolling walker General ADL Comments: Patient feeling much better today. Pain controlled, not anxious. Reports she did her own sponge bath this morning after nurse tech set her up. Patient practiced 3 in 1 transfer and walk-in shower transfer during session, and ambulated to/from bathroom with RW. She requested back to bed at end of session.       Vision                     Perception     Praxis      Cognition   Behavior During Therapy: WFL for tasks assessed/performed Overall Cognitive Status: Within Functional Limits for tasks assessed                       Extremity/Trunk Assessment               Exercises     Shoulder Instructions       General Comments      Pertinent Vitals/ Pain       Pain Assessment: 0-10 Pain Score: 5  Pain Location: R knee Pain Descriptors / Indicators: Aching;Sore Pain Intervention(s): Limited activity within patient's tolerance;Monitored during session;Premedicated before session;Repositioned;Ice applied  Home Living                                          Prior Functioning/Environment  Frequency       Progress Toward Goals  OT Goals(current goals can now be found in the care plan section)  Progress towards OT goals: Goals met/education completed, patient discharged from [facility] goals met and education completed, patient discharged from OT services    Co-evaluation                 End of Session Equipment Utilized During Treatment: Rolling walker;Right knee immobilizer   Activity Tolerance Patient tolerated treatment well   Patient Left in bed;with call bell/phone within reach   Nurse Communication      Functional Assessment Tool Used: clinical judgment Functional Limitation: Self care Self Care Current Status (K3276): At least 20 percent but less than 40 percent impaired, limited or  restricted Self Care Goal Status (D4709): At least 20 percent but less than 40 percent impaired, limited or restricted Self Care Discharge Status [phone]): At least 20 percent but less than 40 percent impaired, limited or restricted   Time: 0916-0952 OT Time Calculation (min): 36 min  Charges: OT G-codes **NOT FOR INPATIENT CLASS** Functional Assessment Tool Used: clinical judgment Functional Limitation: Self care Self Care Current Status (B3403): At least 20 percent but less than 40 percent impaired, limited or restricted Self Care Goal Status (J0964): At least 20 percent but less than 40 percent impaired, limited or restricted Self Care Discharge Status [phone]): At least 20 percent but less than 40 percent impaired, limited or restricted OT General Charges $OT Visit: 1 Procedure OT Treatments $Self Care/Home Management : 23-37 mins  Hayly Litsey A 12/11/2015, 10:39 AM

## 2015-12-11 NOTE — Progress Notes (Signed)
Physical Therapy Treatment Patient Details Name: [name] MRN: [id number] DOB: 23-Feb-1956 Today's Date: 12/11/2015    History of Present Illness Pt is a 60 year old female s/p right knee medial unicompartmental arthroplasty    PT Comments    Pt ambulated in hallway and practiced safe stair technique.  Pt also performed LE exercises and provided with HEP handout.  Pt reports feeling better today.  Follow Up Recommendations  Home health PT     Equipment Recommendations  Rolling walker with 5" wheels    Recommendations for Other Services       Precautions / Restrictions Precautions Precautions: Knee;Fall Required Braces or Orthoses: Knee Immobilizer - Right Restrictions Other Position/Activity Restrictions: WBAT    Mobility  Bed Mobility Overal bed mobility: Needs Assistance Bed Mobility: Supine to Sit;Sit to Supine     Supine to sit: Supervision Sit to supine: Supervision   General bed mobility comments: used LLE to assist RLE  Transfers Overall transfer level: Needs assistance Equipment used: Rolling walker (2 wheeled) Transfers: Sit to/from Stand Sit to Stand: Min guard         General transfer comment: verbal cues for UE and LE positioning  Ambulation/Gait Ambulation/Gait assistance: Min guard Ambulation Distance (Feet): 120 Feet Assistive device: Rolling walker (2 wheeled) Gait Pattern/deviations: Step-to pattern;Antalgic;Decreased stance time - right     General Gait Details: pt able to tolerate improved distance today   Stairs Stairs: Yes Stairs assistance: Min guard Stair Management: Step to pattern;Forwards;One rail Left Number of Stairs: 5 General stair comments: verbal cues for safety, sequence, technique, performed once up 3 and down 2, did not feel she needed to practice again, reports understanding  Wheelchair Mobility    Modified Rankin (Stroke Patients Only)       Balance                                    Cognition Arousal/Alertness: Awake/alert Behavior During Therapy: WFL for tasks assessed/performed Overall Cognitive Status: Within Functional Limits for tasks assessed                      Exercises Total Joint Exercises Ankle Circles/Pumps: AROM;Both;10 reps Quad Sets: AROM;Both;10 reps Heel Slides: AAROM;Right;10 reps Hip ABduction/ADduction: AAROM;Right;10 reps Straight Leg Raises: AAROM;Right;10 reps Goniometric ROM: AAROM knee flexion 30* limited by pain    General Comments        Pertinent Vitals/Pain Pain Assessment: 0-10 Pain Score: 5  Pain Location: R knee Pain Descriptors / Indicators: Aching;Sore Pain Intervention(s): Limited activity within patient's tolerance;Monitored during session;Repositioned;Ice applied    Home Living                      Prior Function            PT Goals (current goals can now be found in the care plan section) Progress towards PT goals: Progressing toward goals    Frequency  7X/week    PT Plan Current plan remains appropriate    Co-evaluation             End of Session Equipment Utilized During Treatment: Gait belt Activity Tolerance: Patient tolerated treatment well Patient left: in bed;with call bell/phone within reach     Time: 1029-1053 PT Time Calculation (min) (ACUTE ONLY): 24 min  Charges:  $Gait Training: 8-22 mins $Therapeutic Exercise: 8-22 mins  G Codes:      Ethelean Colla,KATHrine E 2015/12/24, 1:20 PM Carmelia Bake, PT, DPT 12-24-2015 Pager: [phone]

## 2015-12-18 ENCOUNTER — Telehealth: Payer: Self-pay | Admitting: Gastroenterology

## 2015-12-18 NOTE — Telephone Encounter (Signed)
Patient is advised.  

## 2015-12-18 NOTE — Telephone Encounter (Signed)
Doc of Day Patient c/o burning pain at the breast bone. Describes as "a hole" "hungry feeling" with burping and belching. Food gives temporary relief. No black or tarry bowel movements. She is taking Nexium BID Zantac at noon and Rolaids in between. She is on Xarelto for a few more days. Please advise.

## 2015-12-18 NOTE — Telephone Encounter (Signed)
Not sure what is causing the symptoms.  She should continue bid PPI, should change teh zantact to bedtime dosing.

## 2015-12-30 DIAGNOSIS — M1711 Unilateral primary osteoarthritis, right knee: Secondary | ICD-10-CM | POA: Diagnosis not present

## 2016-01-01 DIAGNOSIS — M1711 Unilateral primary osteoarthritis, right knee: Secondary | ICD-10-CM | POA: Diagnosis not present

## 2016-01-03 DIAGNOSIS — M1711 Unilateral primary osteoarthritis, right knee: Secondary | ICD-10-CM | POA: Diagnosis not present

## 2016-01-06 ENCOUNTER — Telehealth: Payer: Self-pay | Admitting: Family Medicine

## 2016-01-06 DIAGNOSIS — M1711 Unilateral primary osteoarthritis, right knee: Secondary | ICD-10-CM | POA: Diagnosis not present

## 2016-01-06 NOTE — Telephone Encounter (Signed)
Relation to [id number]  Call back number:[phone]  Reason for call:  Patient was estabilished with Dr. Hetty Blend and states she has seen you once and really likes you and would like to establish care. Patient would like to be seen before next available due to partial knee replacement 4 week post op. Please advise

## 2016-01-07 NOTE — Telephone Encounter (Signed)
LVM advising patient to call and schedule appointment

## 2016-01-07 NOTE — Telephone Encounter (Signed)
That is fine, If you can find a spot great if not can figure it out when I am in office am willing to come in early if need be

## 2016-01-08 DIAGNOSIS — M1711 Unilateral primary osteoarthritis, right knee: Secondary | ICD-10-CM | POA: Diagnosis not present

## 2016-01-13 DIAGNOSIS — M1711 Unilateral primary osteoarthritis, right knee: Secondary | ICD-10-CM | POA: Diagnosis not present

## 2016-01-15 DIAGNOSIS — M1711 Unilateral primary osteoarthritis, right knee: Secondary | ICD-10-CM | POA: Diagnosis not present

## 2016-01-16 DIAGNOSIS — F329 Major depressive disorder, single episode, unspecified: Secondary | ICD-10-CM | POA: Diagnosis not present

## 2016-01-16 DIAGNOSIS — F411 Generalized anxiety disorder: Secondary | ICD-10-CM | POA: Diagnosis not present

## 2016-01-16 NOTE — Telephone Encounter (Signed)
Patient scheduled for 02/20/16 at 3:45pm

## 2016-01-17 DIAGNOSIS — M1711 Unilateral primary osteoarthritis, right knee: Secondary | ICD-10-CM | POA: Diagnosis not present

## 2016-01-17 DIAGNOSIS — Z96651 Presence of right artificial knee joint: Secondary | ICD-10-CM | POA: Diagnosis not present

## 2016-01-17 DIAGNOSIS — Z4789 Encounter for other orthopedic aftercare: Secondary | ICD-10-CM | POA: Diagnosis not present

## 2016-01-20 DIAGNOSIS — M1711 Unilateral primary osteoarthritis, right knee: Secondary | ICD-10-CM | POA: Diagnosis not present

## 2016-01-23 DIAGNOSIS — C73 Malignant neoplasm of thyroid gland: Secondary | ICD-10-CM | POA: Diagnosis not present

## 2016-01-23 DIAGNOSIS — E538 Deficiency of other specified B group vitamins: Secondary | ICD-10-CM | POA: Diagnosis not present

## 2016-01-23 DIAGNOSIS — E612 Magnesium deficiency: Secondary | ICD-10-CM | POA: Diagnosis not present

## 2016-01-24 ENCOUNTER — Ambulatory Visit (INDEPENDENT_AMBULATORY_CARE_PROVIDER_SITE_OTHER): Payer: BLUE CROSS/BLUE SHIELD | Admitting: Gastroenterology

## 2016-01-24 ENCOUNTER — Encounter: Payer: Self-pay | Admitting: Gastroenterology

## 2016-01-24 VITALS — BP 102/72 | HR 77 | Ht 66.0 in | Wt 177.0 lb

## 2016-01-24 DIAGNOSIS — K641 Second degree hemorrhoids: Secondary | ICD-10-CM

## 2016-01-24 DIAGNOSIS — K219 Gastro-esophageal reflux disease without esophagitis: Secondary | ICD-10-CM | POA: Diagnosis not present

## 2016-01-24 DIAGNOSIS — M1711 Unilateral primary osteoarthritis, right knee: Secondary | ICD-10-CM | POA: Diagnosis not present

## 2016-01-24 MED ORDER — ESOMEPRAZOLE MAGNESIUM 20 MG PO CPDR: 20.0000 mg | DELAYED_RELEASE_CAPSULE | Freq: Every day | ORAL | Status: DC

## 2016-01-24 MED ORDER — RANITIDINE HCL 150 MG PO TABS: 150.0000 mg | ORAL_TABLET | Freq: Every day | ORAL | Status: DC

## 2016-01-24 NOTE — Patient Instructions (Signed)
You are scheduled for your 1st banding on 02/19/2016 at 3:15pm Use Ranitidine 150 mg at bedtime

## 2016-01-29 DIAGNOSIS — M1711 Unilateral primary osteoarthritis, right knee: Secondary | ICD-10-CM | POA: Diagnosis not present

## 2016-01-30 DIAGNOSIS — N390 Urinary tract infection, site not specified: Secondary | ICD-10-CM | POA: Diagnosis not present

## 2016-01-30 DIAGNOSIS — R3915 Urgency of urination: Secondary | ICD-10-CM | POA: Diagnosis not present

## 2016-01-30 DIAGNOSIS — Z0000 Encounter for general adult medical examination without abnormal findings: Secondary | ICD-10-CM | POA: Diagnosis not present

## 2016-01-31 DIAGNOSIS — M1711 Unilateral primary osteoarthritis, right knee: Secondary | ICD-10-CM | POA: Diagnosis not present

## 2016-01-31 DIAGNOSIS — M7541 Impingement syndrome of right shoulder: Secondary | ICD-10-CM | POA: Diagnosis not present

## 2016-01-31 DIAGNOSIS — M7542 Impingement syndrome of left shoulder: Secondary | ICD-10-CM | POA: Diagnosis not present

## 2016-01-31 NOTE — Progress Notes (Signed)
[name]    [id number]    June 05, 1956  [facility] [address], MD  Referring Physician: Eulas Post, MD [place], [place] [address]  Chief complaint:  GERD HPI:  47 yr pleasant female previously followed by Dr Fuller Plan is here for follow up visit. Patient is worried about using PPI long term and wanted to try going off PPI but her heartburn and reflux symptoms recurred and she is currently on Nexium once daily. She is interested in finding if there is any other approach to treat GERD other than PPI. She has tried H2 blocker and OTC anatacids with no significant benefit. Denies any dysphagia, odynophagia, nausea, vomiting, abdominal pain, or melena. ROS positive for intermittent small volume bright red blood per rectum and prolapsed hemorrhoids that reduce since she was pregnant with her first child.     Outpatient Encounter Prescriptions as of 01/24/2016  Medication Sig  . esomeprazole (NEXIUM) 20 MG capsule Take 1-2 capsules (20-40 mg total) by mouth daily at 12 noon. Reported on 01/24/2016  . hydrocortisone cream 1 % Apply 1 application topically 2 (two) times daily.   . [DISCONTINUED] esomeprazole (NEXIUM) 20 MG capsule Take 20-40 mg by mouth daily at 12 noon. Reported on 01/24/2016  . ranitidine (ZANTAC) 150 MG tablet Take 1 tablet (150 mg total) by mouth at bedtime.  . [DISCONTINUED] HYDROmorphone (DILAUDID) 2 MG tablet Take 0.5 tablets (1 mg total) by mouth every 4 (four) hours as needed for severe pain. (Patient not taking: Reported on 01/24/2016)  . [DISCONTINUED] levothyroxine (SYNTHROID, LEVOTHROID) 125 MCG tablet Take 125 mcg by mouth daily before breakfast. Reported on 01/24/2016  . [DISCONTINUED] methocarbamol (ROBAXIN) 500 MG tablet Take 1 tablet (500 mg total) by mouth every 6 (six) hours as needed for muscle spasms. (Patient not taking: Reported on 01/24/2016)  . [DISCONTINUED] NEXIUM 40 MG capsule Take 40 mg daily in the  morning. (Patient not taking: Reported on 01/24/2016)  . [DISCONTINUED] ondansetron (ZOFRAN) 4 MG tablet Take 1 tablet (4 mg total) by mouth every 6 (six) hours as needed for nausea. (Patient not taking: Reported on 01/24/2016)  . [DISCONTINUED] rivaroxaban (XARELTO) 10 MG TABS tablet Take 1 tablet (10 mg total) by mouth daily with breakfast. Xarelto 10 mg daily for ten days, then change to Aspirin 325 mg daily for two weeks, then reduce to Baby Aspirin 81 mg daily for three additional weeks. (Patient not taking: Reported on 01/24/2016)  . [DISCONTINUED] traMADol (ULTRAM) 50 MG tablet Take 1-2 tablets (50-100 mg total) by mouth every 6 (six) hours as needed (mil dpain). (Patient not taking: Reported on 01/24/2016)   No facility-administered encounter medications on file as of 01/24/2016.    Allergies as of 01/24/2016 - Review Complete 01/24/2016  Allergen Reaction Noted  . Celestone [betamethasone sodium phosphate] Other (See Comments) 10/12/2011  . Droperidol Other (See Comments) 10/12/2011  . Omni-pac [cefdinir] Diarrhea and Other (See Comments) 08/28/2014  . Statins Other (See Comments) 08/28/2014  . Hydrocodone Itching and Rash 11/22/2015    Past Medical History  Diagnosis Date  . Barrett's esophagus without dysplasia     pt says was suspected never confirmed 9 or 10 years ago  . GERD (gastroesophageal reflux disease)   . Fundic gland polyps of stomach, benign   . Internal hemorrhoids   . Hyperlipidemia     borderline  . History of hiatal hernia   . Allergic rhinitis   .  History of thyroid cancer     05-01-2011  s/p  total thyroidectomy for multinodular goiter and papillary carcinoma  . Hypothyroidism, postsurgical   . Esophagitis   . Lumbar spondylosis     L4 -- S1  . History of ovarian cyst   . TMJ (temporomandibular joint disorder)   . PONV (postoperative nausea and vomiting)     nausea with pain meds  . Hemorrhoids     Past Surgical History  Procedure Laterality Date  .  Total thyroidectomy  05-01-2011  . Hemorrhoid surgery    . Appendectomy  01-30-2008  . Laparoscopic tubal ligation  07-21-2002    W/   Dilatation and evacuation  . Transobturator sling  01-07-2005    W/ CYSTOCELE REPAIR  . Upper gastrointestinal endoscopy  last one 06-29-2014  . Colonoscopy  08-08-2007  . Laparoscopic right oophorectomy  1996  . Left shoulder closed manipulation/  arthroscopic debridement labrum/  sad/ release  ca ligament/ bursectomy  10-12-2011  . Carpal tunnel release Right 08/30/2014    Procedure: RIGHT CARPAL TUNNEL RELEASE;  Surgeon: Linna Hoff, MD;  Location: [name];  Service: Orthopedics;  Laterality: Right;  . Partial knee arthroplasty Right 12/09/2015    Procedure: RIGHT KNEE MEDIAL UNICOMPARTMENTAL ARTHROPLASTY ;  Surgeon: Gaynelle Arabian, MD;  Location: [facility];  Service: Orthopedics;  Laterality: Right;    Family History  Problem Relation Age of Onset  . Diabetes Father   . Cancer Father     prostate  . Thyroid disease Mother     hyperparathyroidism  . Thyroid disease Maternal Grandmother   . Esophageal cancer Neg Hx   . Colon cancer Neg Hx     Social History   Social History  . Marital Status: Married    Spouse Name: N/A  . Number of Children: N/A  . Years of Education: N/A   Occupational History  . Not on file.   Social History Main Topics  . Smoking status: Former Smoker -- 5 years    Types: Cigarettes    Start date: 09/28/1984    Quit date: 08/28/1985  . Smokeless tobacco: Never Used  . Alcohol Use: Yes     Comment: rarely  . Drug Use: No  . Sexual Activity: Not on file   Other Topics Concern  . Not on file   Social History Narrative      Review of systems: Review of Systems  Constitutional: Negative for fever and chills.  HENT: Negative.   Eyes: Negative for blurred vision.  Respiratory: Negative for cough, shortness of breath and wheezing.   Cardiovascular: Negative for chest pain and palpitations.   Gastrointestinal: as per HPI Genitourinary: Negative for dysuria, urgency, frequency and hematuria.  Musculoskeletal: Negative for myalgias, back pain and joint pain.  Skin: Negative for itching and rash.  Neurological: Negative for dizziness, tremors, focal weakness, seizures and loss of consciousness.  Endo/Heme/Allergies: Negative for environmental allergies.  Psychiatric/Behavioral: Negative for depression, suicidal ideas and hallucinations.  All other systems reviewed and are negative.   Physical Exam: Filed Vitals:   01/24/16 1051  BP: 102/72  Pulse: 77   Gen:      No acute distress HEENT:  EOMI, sclera anicteric Neck:     No masses; no thyromegaly Lungs:    Clear to auscultation bilaterally; normal respiratory effort CV:         Regular rate and rhythm; no murmurs Abd:      + bowel sounds; soft, non-tender; no  palpable masses, no distension Ext:    No edema; adequate peripheral perfusion Skin:      Warm and dry; no rash Neuro: alert and oriented x 3 Psych: normal mood and affect  Data Reviewed: Reviewed chart in detail in epic  Assessment and Plan/Recommendations: 54 yr F with h/o chronic GERD and evidence of Barrett's esophagus in 2008, subsequent biopsies negative for Barrett's, could be sampling error or likely a very short segment of Barrett's Given she becomes symptomatic with moderate to severe reflux symptoms once she comes off PPI affecting her daily life, wouldn't recommend coming off PPI at this point. Discussed the potential side effects with long-term PPI use and also reiterated that there is not enough data to support dementia with long-term PPI use. Based on new scientific research there is probably higher risk for dementia with the use of artificial sweeteners Continue PPI daily and use H2 blocker at bedtime as needed Continue to follow antireflux measures, diet and exercise for weight loss Also discussed referral for antireflux surgery but patient is not  interested in pursuing it She is interested in undergoing hemorrhoidal and ligation for symptomatic hemorrhoids and will schedule it in next few weeks   K. Denzil Magnuson , MD [phone] Mon-Fri 8a-5p [phone] after 5p, weekends, holidays  CC: Burchette, Alinda Sierras, MD

## 2016-02-03 DIAGNOSIS — H1045 Other chronic allergic conjunctivitis: Secondary | ICD-10-CM | POA: Diagnosis not present

## 2016-02-04 DIAGNOSIS — M1711 Unilateral primary osteoarthritis, right knee: Secondary | ICD-10-CM | POA: Diagnosis not present

## 2016-02-06 DIAGNOSIS — R102 Pelvic and perineal pain: Secondary | ICD-10-CM | POA: Diagnosis not present

## 2016-02-06 DIAGNOSIS — Z0000 Encounter for general adult medical examination without abnormal findings: Secondary | ICD-10-CM | POA: Diagnosis not present

## 2016-02-07 DIAGNOSIS — M1711 Unilateral primary osteoarthritis, right knee: Secondary | ICD-10-CM | POA: Diagnosis not present

## 2016-02-18 DIAGNOSIS — M1711 Unilateral primary osteoarthritis, right knee: Secondary | ICD-10-CM | POA: Diagnosis not present

## 2016-02-19 ENCOUNTER — Ambulatory Visit (INDEPENDENT_AMBULATORY_CARE_PROVIDER_SITE_OTHER): Payer: BLUE CROSS/BLUE SHIELD | Admitting: Gastroenterology

## 2016-02-19 ENCOUNTER — Encounter: Payer: Self-pay | Admitting: Gastroenterology

## 2016-02-19 VITALS — BP 94/70 | HR 80 | Ht 67.5 in | Wt 173.0 lb

## 2016-02-19 DIAGNOSIS — K641 Second degree hemorrhoids: Secondary | ICD-10-CM

## 2016-02-19 NOTE — Patient Instructions (Signed)
Take VSL #3 once  Daily, we have given you samples to take.  This can also be purchased over the counter at [facility] at [facility].  Call back to set up another banding as needed.   HEMORRHOID BANDING PROCEDURE    FOLLOW-UP CARE   1. The procedure you have had should have been relatively painless since the banding of the area involved does not have nerve endings and there is no pain sensation.  The rubber band cuts off the blood supply to the hemorrhoid and the band may fall off as soon as 48 hours after the banding (the band may occasionally be seen in the toilet bowl following a bowel movement). You may notice a temporary feeling of fullness in the rectum which should respond adequately to plain Tylenol or Motrin.  2. Following the banding, avoid strenuous exercise that evening and resume full activity the next day.  A sitz bath (soaking in a warm tub) or bidet is soothing, and can be useful for cleansing the area after bowel movements.     3. To avoid constipation, take two tablespoons of natural wheat bran, natural oat bran, flax, Benefiber or any over the counter fiber supplement and increase your water intake to 7-8 glasses daily.    4. Unless you have been prescribed anorectal medication, do not put anything inside your rectum for two weeks: No suppositories, enemas, fingers, etc.  5. Occasionally, you may have more bleeding than usual after the banding procedure.  This is often from the untreated hemorrhoids rather than the treated one.  Don't be concerned if there is a tablespoon or so of blood.  If there is more blood than this, lie flat with your bottom higher than your head and apply an ice pack to the area. If the bleeding does not stop within a half an hour or if you feel faint, call our office at [phone] or go to the emergency room.  6. Problems are not common; however, if there is a substantial amount of bleeding, severe pain, chills, fever or  difficulty passing urine (very rare) or other problems, you should call us at [phone] or report to the nearest emergency room.  7. Do not stay seated continuously for more than 2-3 hours for a day or two after the procedure.  Tighten your buttock muscles 10-15 times every two hours and take 10-15 deep breaths every 1-2 hours.  Do not spend more than a few minutes on the toilet if you cannot empty your bowel; instead re-visit the toilet at a later time.

## 2016-02-19 NOTE — Progress Notes (Signed)
PROCEDURE NOTE: The patient presents with symptomatic grade II hemorrhoids, requesting rubber band ligation of his/her hemorrhoidal disease.  All risks, benefits and alternative forms of therapy were described and informed consent was obtained.  In the Left Lateral Decubitus position anoscopic examination revealed grade II hemorrhoids in the L lateral, R anterior and R posterior position(s).  The anorectum was pre-medicated with 0.125% nitroglycerine The decision was made to band the Left lateral internal hemorrhoid, and the [place] was used to perform band ligation without complication.  Digital anorectal examination was then performed to assure proper positioning of the band, and to adjust the banded tissue as required.  The patient was discharged home without pain or other issues.  Dietary and behavioral recommendations were given and along with follow-up instructions.     The following adjunctive treatments were recommended: VSL# 3 regular strength 450 U daily x 1 month  The patient will return for  follow-up and possible additional banding as required. No complications were encountered and the patient tolerated the procedure well.  Damaris Hippo , MD [phone] Mon-Fri 8a-5p [phone] after 5p, weekends, holidays

## 2016-02-20 ENCOUNTER — Encounter: Payer: Self-pay | Admitting: Family Medicine

## 2016-02-20 ENCOUNTER — Ambulatory Visit (INDEPENDENT_AMBULATORY_CARE_PROVIDER_SITE_OTHER): Payer: BLUE CROSS/BLUE SHIELD | Admitting: Family Medicine

## 2016-02-20 VITALS — BP 118/72 | HR 92 | Temp 98.2°F | Ht 67.0 in | Wt 171.4 lb

## 2016-02-20 DIAGNOSIS — E782 Mixed hyperlipidemia: Secondary | ICD-10-CM | POA: Diagnosis not present

## 2016-02-20 DIAGNOSIS — E663 Overweight: Secondary | ICD-10-CM

## 2016-02-20 DIAGNOSIS — R002 Palpitations: Secondary | ICD-10-CM | POA: Diagnosis not present

## 2016-02-20 DIAGNOSIS — E039 Hypothyroidism, unspecified: Secondary | ICD-10-CM

## 2016-02-20 DIAGNOSIS — H8111 Benign paroxysmal vertigo, right ear: Secondary | ICD-10-CM

## 2016-02-20 DIAGNOSIS — R739 Hyperglycemia, unspecified: Secondary | ICD-10-CM

## 2016-02-20 DIAGNOSIS — K219 Gastro-esophageal reflux disease without esophagitis: Secondary | ICD-10-CM

## 2016-02-20 NOTE — Progress Notes (Signed)
Pre visit review using our clinic review tool, if applicable. No additional management support is needed unless otherwise documented below in the visit note. 

## 2016-02-20 NOTE — Assessment & Plan Note (Signed)
Encouraged heart healthy diet, increase exercise, avoid trans fats, consider a krill oil cap daily 

## 2016-02-20 NOTE — Patient Instructions (Addendum)
NOW 10 strain probiotic daily can purchase at [employer] or [email or web address] Krill or fish oil caps daily   Salon Pas or Aspercreme gel or patches traditional NSAID or Lidocaine   Food Choices for Gastroesophageal Reflux Disease, Adult When you have gastroesophageal reflux disease (GERD), the foods you eat and your eating habits are very important. Choosing the right foods can help ease the discomfort of GERD. WHAT GENERAL GUIDELINES DO I NEED TO FOLLOW?  Choose fruits, vegetables, whole grains, low-fat dairy products, and low-fat meat, fish, and poultry.  Limit fats such as oils, salad dressings, butter, nuts, and avocado.  Keep a food diary to identify foods that cause symptoms.  Avoid foods that cause reflux. These may be different for different people.  Eat frequent small meals instead of three large meals each day.  Eat your meals slowly, in a relaxed setting.  Limit fried foods.  Cook foods using methods other than frying.  Avoid drinking alcohol.  Avoid drinking large amounts of liquids with your meals.  Avoid bending over or lying down until 2-3 hours after eating. WHAT FOODS ARE NOT RECOMMENDED? The following are some foods and drinks that may worsen your symptoms: Vegetables Tomatoes. Tomato juice. Tomato and spaghetti sauce. Chili peppers. Onion and garlic. Horseradish. Fruits Oranges, grapefruit, and lemon (fruit and juice). Meats High-fat meats, fish, and poultry. This includes hot dogs, ribs, ham, sausage, salami, and bacon. Dairy Whole milk and chocolate milk. Sour cream. Cream. Butter. Ice cream. Cream cheese.  Beverages Coffee and tea, with or without caffeine. Carbonated beverages or energy drinks. Condiments Hot sauce. Barbecue sauce.  Sweets/Desserts Chocolate and cocoa. Donuts. Peppermint and spearmint. Fats and Oils High-fat foods, including [place] fries and potato chips. Other Vinegar. Strong spices, such as black pepper, white  pepper, red pepper, cayenne, curry powder, cloves, ginger, and chili powder. The items listed above may not be a complete list of foods and beverages to avoid. Contact your dietitian for more information.   This information is not intended to replace advice given to you by your health care provider. Make sure you discuss any questions you have with your health care provider.   Document Released: 09/14/2005 Document Revised: 10/05/2014 Document Reviewed: 07/19/2013 Elsevier Interactive Patient Education [employer].

## 2016-02-21 ENCOUNTER — Other Ambulatory Visit (INDEPENDENT_AMBULATORY_CARE_PROVIDER_SITE_OTHER): Payer: BLUE CROSS/BLUE SHIELD

## 2016-02-21 DIAGNOSIS — E663 Overweight: Secondary | ICD-10-CM | POA: Diagnosis not present

## 2016-02-21 DIAGNOSIS — E039 Hypothyroidism, unspecified: Secondary | ICD-10-CM | POA: Diagnosis not present

## 2016-02-21 DIAGNOSIS — H8111 Benign paroxysmal vertigo, right ear: Secondary | ICD-10-CM

## 2016-02-21 DIAGNOSIS — R002 Palpitations: Secondary | ICD-10-CM | POA: Diagnosis not present

## 2016-02-21 DIAGNOSIS — M1711 Unilateral primary osteoarthritis, right knee: Secondary | ICD-10-CM | POA: Diagnosis not present

## 2016-02-21 DIAGNOSIS — E782 Mixed hyperlipidemia: Secondary | ICD-10-CM

## 2016-02-21 LAB — CBC
HCT: 39.7 % (ref 36.0–46.0)
Hemoglobin: 13.3 g/dL (ref 12.0–15.0)
MCHC: 33.4 g/dL (ref 30.0–36.0)
MCV: 86.7 fl (ref 78.0–100.0)
Platelets: 252 10*3/uL (ref 150.0–400.0)
RBC: 4.58 Mil/uL (ref 3.87–5.11)
RDW: 13.6 % (ref 11.5–15.5)
WBC: 4.9 10*3/uL (ref 4.0–10.5)

## 2016-02-21 LAB — COMPREHENSIVE METABOLIC PANEL
ALT: 15 U/L (ref 0–35)
AST: 15 U/L (ref 0–37)
Albumin: 4.5 g/dL (ref 3.5–5.2)
Alkaline Phosphatase: 82 U/L (ref 39–117)
BUN: 14 mg/dL (ref 6–23)
CO2: 29 mEq/L (ref 19–32)
Calcium: 9.2 mg/dL (ref 8.4–10.5)
Chloride: 105 mEq/L (ref 96–112)
Creatinine, Ser: 0.74 mg/dL (ref 0.40–1.20)
GFR: 85.47 mL/min (ref >60.00)
Glucose, Bld: 114 mg/dL — ABNORMAL HIGH (ref 70–99)
Potassium: 4.3 mEq/L (ref 3.5–5.1)
Sodium: 141 mEq/L (ref 135–145)
Total Bilirubin: 0.4 mg/dL (ref 0.2–1.2)
Total Protein: 6.7 g/dL (ref 6.0–8.3)

## 2016-02-21 LAB — LIPID PANEL
Cholesterol: 237 mg/dL — ABNORMAL HIGH (ref 0–200)
HDL: 43.5 mg/dL (ref >39.00)
LDL Cholesterol: 173 mg/dL — ABNORMAL HIGH (ref 0–99)
NonHDL: 193.73
Total CHOL/HDL Ratio: 5
Triglycerides: 106 mg/dL (ref 0.0–149.0)
VLDL: 21.2 mg/dL (ref 0.0–40.0)

## 2016-02-21 LAB — HEMOGLOBIN A1C: Hgb A1c MFr Bld: 5.9 % (ref 4.6–6.5)

## 2016-02-26 DIAGNOSIS — M1711 Unilateral primary osteoarthritis, right knee: Secondary | ICD-10-CM | POA: Diagnosis not present

## 2016-02-27 ENCOUNTER — Encounter: Payer: Self-pay | Admitting: Family Medicine

## 2016-03-01 ENCOUNTER — Encounter: Payer: Self-pay | Admitting: Family Medicine

## 2016-03-01 DIAGNOSIS — R739 Hyperglycemia, unspecified: Secondary | ICD-10-CM

## 2016-03-01 DIAGNOSIS — R002 Palpitations: Secondary | ICD-10-CM | POA: Insufficient documentation

## 2016-03-01 HISTORY — DX: Palpitations: R00.2

## 2016-03-01 HISTORY — DX: Hyperglycemia, unspecified: R73.9

## 2016-03-01 NOTE — Assessment & Plan Note (Signed)
[id number] acceptable, minimize simple carbs. Increase exercise as tolerated.  

## 2016-03-01 NOTE — Assessment & Plan Note (Signed)
Minimize sodium, caffeine and alcohol. Get adequate sleep and exercise and report worsening symptoms.

## 2016-03-01 NOTE — Progress Notes (Signed)
Patient ID: [name], female   DOB: 1955-12-10, 60 y.o.   MRN: [id number]   Subjective:    Patient ID: [name], female    DOB: 06-22-1956, 60 y.o.   MRN: [id number]  Chief Complaint  Patient presents with  . Establish Care    HPI Patient is in today for new patient appointment. She has a past medical history significant for thyroid cancer, now with hypothyroid secondary to treatments and continues to struggle with fatigue and infrequent palpitations. Notes some dyspepsia, no nausea or vomting. Notes some knee dysfunction after TKS in March of 2017. Has toleated nexium in past ford yspepsia. Denies CP/palp/SOB/HA/congestion/fevers/GI or GU c/o. Taking meds as prescribed  Past Medical History  Diagnosis Date  . Barrett's esophagus without dysplasia     pt says was suspected never confirmed 9 or 10 years ago  . GERD (gastroesophageal reflux disease)   . Fundic gland polyps of stomach, benign   . Internal hemorrhoids   . Hyperlipidemia     borderline  . History of hiatal hernia   . Allergic rhinitis   . History of thyroid cancer     05-01-2011  s/p  total thyroidectomy for multinodular goiter and papillary carcinoma  . Hypothyroidism, postsurgical   . Esophagitis   . Lumbar spondylosis     L4 -- S1  . History of ovarian cyst   . TMJ (temporomandibular joint disorder)   . PONV (postoperative nausea and vomiting)     nausea with pain meds  . Hemorrhoids   . Overweight 08/14/2009    Qualifier: Diagnosis of  By: Niel Hummer MD, Lorinda Creed   . Palpitations 03/01/2016  . Hyperglycemia 03/01/2016    Past Surgical History  Procedure Laterality Date  . Total thyroidectomy  05-01-2011  . Hemorrhoid surgery    . Appendectomy  01-30-2008  . Laparoscopic tubal ligation  07-21-2002    W/   Dilatation and evacuation  . Transobturator sling  01-07-2005    W/ CYSTOCELE REPAIR  . Upper gastrointestinal endoscopy  last one 06-29-2014  . Colonoscopy  08-08-2007  . Laparoscopic  right oophorectomy  1996  . Left shoulder closed manipulation/  arthroscopic debridement labrum/  sad/ release  ca ligament/ bursectomy  10-12-2011  . Carpal tunnel release Right 08/30/2014    Procedure: RIGHT CARPAL TUNNEL RELEASE;  Surgeon: Linna Hoff, MD;  Location: [facility];  Service: Orthopedics;  Laterality: Right;  . Partial knee arthroplasty Right 12/09/2015    Procedure: RIGHT KNEE MEDIAL UNICOMPARTMENTAL ARTHROPLASTY ;  Surgeon: Gaynelle Arabian, MD;  Location: [facility];  Service: Orthopedics;  Laterality: Right;    Family History  Problem Relation Age of Onset  . Diabetes Father   . Cancer Father     prostate  . Stroke Father     bacterial endocarditis after prostate surgery  . Heart disease Father     aortic valvular disease, MI  . Thyroid disease Mother     hyperparathyroidism  . Alzheimer's disease Mother   . Hyperparathyroidism Mother   . Atrial fibrillation Mother   . Hypertension Mother   . Hyperlipidemia Mother   . Thyroid disease Maternal Grandmother   . Esophageal cancer Neg Hx   . Colon cancer Neg Hx   . Hyperlipidemia Brother   . Hypertension Brother   . Allergies Brother   . Heart disease Daughter     palpitations    Social History   Social History  . Marital Status: Married  Spouse Name: N/A  . Number of Children: N/A  . Years of Education: N/A   Occupational History  . Not on file.   Social History Main Topics  . Smoking status: Former Smoker -- 5 years    Types: Cigarettes    Start date: 09/28/1984    Quit date: 08/28/1985  . Smokeless tobacco: Never Used  . Alcohol Use: Yes     Comment: rarely  . Drug Use: No  . Sexual Activity: Not on file   Other Topics Concern  . Not on file   Social History Narrative    Outpatient Prescriptions Prior to Visit  Medication Sig Dispense Refill  . esomeprazole (NEXIUM) 20 MG capsule Take 1-2 capsules (20-40 mg total) by mouth daily at 12 noon. Reported on 01/24/2016 60 capsule  3  . hydrocortisone cream 1 % Apply 1 application topically 2 (two) times daily.     . ranitidine (ZANTAC) 150 MG tablet Take 1 tablet (150 mg total) by mouth at bedtime. 30 tablet 3  . SYNTHROID 125 MCG tablet   1   No facility-administered medications prior to visit.    Allergies  Allergen Reactions  . Celestone [Betamethasone Sodium Phosphate] Other (See Comments)    "Feels hot and looks sunburned"  . Droperidol Other (See Comments)    "feels like I have withdrawal symptoms"  . Omni-Pac [Cefdinir] Diarrhea and Other (See Comments)    Other Reaction: Other reaction  . Statins Other (See Comments)    "joint stiffness"  . Hydrocodone Itching and Rash    Review of Systems  Constitutional: Positive for malaise/fatigue. Negative for fever and chills.  HENT: Negative for congestion and hearing loss.   Eyes: Negative for discharge.  Respiratory: Negative for cough, sputum production and shortness of breath.   Cardiovascular: Negative for chest pain, palpitations and leg swelling.  Gastrointestinal: Positive for heartburn. Negative for nausea, vomiting, abdominal pain, diarrhea, constipation and blood in stool.  Genitourinary: Negative for dysuria, urgency, frequency and hematuria.  Musculoskeletal: Negative for myalgias, back pain and falls.  Skin: Negative for rash.  Neurological: Negative for dizziness, sensory change, loss of consciousness, weakness and headaches.  Endo/Heme/Allergies: Negative for environmental allergies. Does not bruise/bleed easily.  Psychiatric/Behavioral: Negative for depression and suicidal ideas. The patient is not nervous/anxious and does not have insomnia.        Objective:    Physical Exam  Constitutional: She is oriented to person, place, and time. She appears well-developed and well-nourished. No distress.  HENT:  Head: Normocephalic and atraumatic.  Right Ear: External ear normal.  Left Ear: External ear normal.  Nose: Nose normal.    Mouth/Throat: Oropharynx is clear and moist.  Eyes: Conjunctivae and EOM are normal. Pupils are equal, round, and reactive to light. Right eye exhibits no discharge. Left eye exhibits no discharge.  Neck: Normal range of motion. Neck supple. No JVD present. No thyromegaly present.  Cardiovascular: Normal rate, regular rhythm, normal heart sounds and intact distal pulses.   No murmur heard. Pulmonary/Chest: Effort normal and breath sounds normal. No respiratory distress. She has no wheezes. She has no rales. She exhibits no tenderness.  Abdominal: Soft. Bowel sounds are normal. She exhibits no distension and no mass. There is no tenderness. There is no rebound and no guarding.  Genitourinary: Vagina normal and uterus normal. Guaiac negative stool. No vaginal discharge found.  Musculoskeletal: Normal range of motion. She exhibits no edema or tenderness.  Lymphadenopathy:    She has no cervical adenopathy.  Neurological: She is alert and oriented to person, place, and time. She has normal reflexes. No cranial nerve deficit.  Skin: Skin is warm and dry. No rash noted. She is not diaphoretic. No erythema.  Psychiatric: She has a normal mood and affect. Her behavior is normal. Judgment and thought content normal.  Nursing note and vitals reviewed.   BP 118/72 mmHg  Pulse 92  Temp(Src) 98.2 F (36.8 C) (Oral)  Ht 5\' 7"  (1.702 m)  Wt 171 lb 6 oz (77.735 kg)  BMI 26.83 kg/m2  SpO2 94% Wt Readings from Last 3 Encounters:  02/20/16 171 lb 6 oz (77.735 kg)  02/19/16 173 lb (78.472 kg)  01/24/16 177 lb (80.287 kg)     Lab Results  Component Value Date   WBC 4.9 02/21/2016   HGB 13.3 02/21/2016   HCT 39.7 02/21/2016   PLT 252.0 02/21/2016   GLUCOSE 114* 02/21/2016   CHOL 237* 02/21/2016   TRIG 106.0 02/21/2016   HDL 43.50 02/21/2016   LDLDIRECT 212.6 08/10/2012   LDLCALC 173* 02/21/2016   ALT 15 02/21/2016   AST 15 02/21/2016   NA 141 02/21/2016   K 4.3 02/21/2016   CL 105  02/21/2016   CREATININE 0.74 02/21/2016   BUN 14 02/21/2016   CO2 29 02/21/2016   TSH 1.15 09/03/2015   INR 1.10 12/03/2015   HGBA1C 5.9 02/21/2016    Lab Results  Component Value Date   TSH 1.15 09/03/2015   Lab Results  Component Value Date   WBC 4.9 02/21/2016   HGB 13.3 02/21/2016   HCT 39.7 02/21/2016   MCV 86.7 02/21/2016   PLT 252.0 02/21/2016   Lab Results  Component Value Date   NA 141 02/21/2016   K 4.3 02/21/2016   CO2 29 02/21/2016   GLUCOSE 114* 02/21/2016   BUN 14 02/21/2016   CREATININE 0.74 02/21/2016   BILITOT 0.4 02/21/2016   ALKPHOS 82 02/21/2016   AST 15 02/21/2016   ALT 15 02/21/2016   PROT 6.7 02/21/2016   ALBUMIN 4.5 02/21/2016   CALCIUM 9.2 02/21/2016   ANIONGAP 8 12/11/2015   GFR 85.47 02/21/2016   Lab Results  Component Value Date   CHOL 237* 02/21/2016   Lab Results  Component Value Date   HDL 43.50 02/21/2016   Lab Results  Component Value Date   LDLCALC 173* 02/21/2016   Lab Results  Component Value Date   TRIG 106.0 02/21/2016   Lab Results  Component Value Date   CHOLHDL 5 02/21/2016   Lab Results  Component Value Date   HGBA1C 5.9 02/21/2016       Assessment & Plan:   Problem List Items Addressed This Visit    RESOLVED: PALPITATIONS, RECURRENT   Relevant Orders   Hemoglobin A1c (Completed)   Lipid panel (Completed)   Comprehensive metabolic panel (Completed)   CBC (Completed)   Palpitations    Minimize sodium, caffeine and alcohol. Get adequate sleep and exercise and report worsening symptoms.       Relevant Orders   Hemoglobin A1c (Completed)   Lipid panel (Completed)   Comprehensive metabolic panel (Completed)   CBC (Completed)   Overweight - Primary    Encouraged DASH diet, decrease po intake and increase exercise as tolerated. Needs 7-8 hours of sleep nightly. Avoid trans fats, eat small, frequent meals every 4-5 hours with lean proteins, complex carbs and healthy fats. Minimize simple carbs        Relevant Orders   Hemoglobin A1c (Completed)  Lipid panel (Completed)   Comprehensive metabolic panel (Completed)   CBC (Completed)   Mixed hyperlipidemia    Encouraged heart healthy diet, increase exercise, avoid trans fats, consider a krill oil cap daily      Relevant Orders   Hemoglobin A1c (Completed)   Lipid panel (Completed)   Comprehensive metabolic panel (Completed)   CBC (Completed)   Hypothyroidism    On Levothyroxine, is following with endocrinology. Needs TSH checked at end of June.      Relevant Orders   Hemoglobin A1c (Completed)   Lipid panel (Completed)   Comprehensive metabolic panel (Completed)   CBC (Completed)   TSH   Hyperglycemia    hgba1c acceptable, minimize simple carbs. Increase exercise as tolerated.       GERD    Avoid offending foods, start probiotics. Do not eat large meals in late evening and consider raising head of bed. Nexium and Ranitidine daily      BENIGN POSITIONAL VERTIGO   Relevant Orders   Hemoglobin A1c (Completed)   Lipid panel (Completed)   Comprehensive metabolic panel (Completed)   CBC (Completed)      I am having Ms. Gravett maintain her hydrocortisone cream, esomeprazole, ranitidine, and SYNTHROID.  No orders of the defined types were placed in this encounter.     Penni Homans, MD

## 2016-03-01 NOTE — Assessment & Plan Note (Addendum)
On Levothyroxine, is following with endocrinology. Needs TSH checked at end of June.

## 2016-03-01 NOTE — Assessment & Plan Note (Signed)
Avoid offending foods, start probiotics. Do not eat large meals in late evening and consider raising head of bed. Nexium and Ranitidine daily

## 2016-03-01 NOTE — Assessment & Plan Note (Signed)
Encouraged DASH diet, decrease po intake and increase exercise as tolerated. Needs 7-8 hours of sleep nightly. Avoid trans fats, eat small, frequent meals every 4-5 hours with lean proteins, complex carbs and healthy fats. Minimize simple carbs 

## 2016-03-09 DIAGNOSIS — M1711 Unilateral primary osteoarthritis, right knee: Secondary | ICD-10-CM | POA: Diagnosis not present

## 2016-03-09 DIAGNOSIS — M7542 Impingement syndrome of left shoulder: Secondary | ICD-10-CM | POA: Diagnosis not present

## 2016-03-09 DIAGNOSIS — M7541 Impingement syndrome of right shoulder: Secondary | ICD-10-CM | POA: Diagnosis not present

## 2016-03-18 ENCOUNTER — Telehealth: Payer: Self-pay | Admitting: Family Medicine

## 2016-03-18 NOTE — Telephone Encounter (Signed)
Relation to [id number] Call back [phone] Pharmacy:  Reason for call:  Patient will be going to [facility] Monday 03/23/16 to have her TSH drawn. Patient requesting results fax to [facility]. Quentin Cornwall, MD, PhD  [address], [place], [place] [address] Phone: [phone] Attention Corky Sox  [phone]

## 2016-03-23 ENCOUNTER — Other Ambulatory Visit (INDEPENDENT_AMBULATORY_CARE_PROVIDER_SITE_OTHER): Payer: BLUE CROSS/BLUE SHIELD

## 2016-03-23 DIAGNOSIS — E039 Hypothyroidism, unspecified: Secondary | ICD-10-CM

## 2016-03-23 DIAGNOSIS — F411 Generalized anxiety disorder: Secondary | ICD-10-CM | POA: Diagnosis not present

## 2016-03-23 DIAGNOSIS — F331 Major depressive disorder, recurrent, moderate: Secondary | ICD-10-CM | POA: Diagnosis not present

## 2016-03-23 LAB — TSH: TSH: 2.59 u[IU]/mL (ref 0.35–4.50)

## 2016-03-24 NOTE — Telephone Encounter (Signed)
Faxed results to number below as instructed

## 2016-03-26 DIAGNOSIS — F331 Major depressive disorder, recurrent, moderate: Secondary | ICD-10-CM | POA: Diagnosis not present

## 2016-03-26 DIAGNOSIS — F411 Generalized anxiety disorder: Secondary | ICD-10-CM | POA: Diagnosis not present

## 2016-04-09 ENCOUNTER — Telehealth: Payer: Self-pay | Admitting: Gastroenterology

## 2016-04-09 NOTE — Telephone Encounter (Signed)
Spoke with the patient. She has stopped all soft drinks for 2+ months. Lots of stress with her mother and her husband hospitalized. She has noted a burning gnawing raw feeling in her stomach about 1 hour after taking the Nexium. Rolaids does not relieve it. Zantac helped more. She has taken 2 Zantac in a 24 hour period (300mg  total). But she has a general uncomfortableness of her abdomen. She has the burning and a little bloating. She is still on VSL and feels that has made her a healthy person. Your thoughts?

## 2016-04-09 NOTE — Telephone Encounter (Signed)
Please advise patient to discontinue Nexium. Can just take Zantac as needed. Small frequent meals. Thanks

## 2016-04-09 NOTE — Telephone Encounter (Signed)
Discussed the plan with the patient. She will make these changes and follow up in a week, sooner if she worsens.

## 2016-04-14 ENCOUNTER — Telehealth: Payer: Self-pay | Admitting: Gastroenterology

## 2016-04-14 NOTE — Telephone Encounter (Signed)
She is off coffee and all sodas. She has been taking Zantac BID. Symptoms are worsening. She is supplementing with Rolaids. She did take another Nexium, but this did not help. She says it is a terrible burning. Should I bring her in for evaluation? Please advise.

## 2016-04-15 NOTE — Telephone Encounter (Signed)
Please advise her to take PPI daily, she has known symptomatic GERD and also evidence of short segment Barrett's in 2008. Seems like her symptoms are not controlled by H2 blocker. She will need daily PPI to control her symptoms. Taking PPI once in a while may not be sufficient to control her symptoms. I had discussed this extensively with patient at last office visit in April, but if her symptoms have changed and need further evaluation, please schedule for office visit with APP. Thanks

## 2016-04-15 NOTE — Telephone Encounter (Signed)
Discussed PPI, healthy GI habits and risks. She seems at this point to be satisfied with returning to daily PPI use. She will call back PRN questions and concerns.

## 2016-05-07 ENCOUNTER — Encounter: Payer: Self-pay | Admitting: Family Medicine

## 2016-05-07 ENCOUNTER — Ambulatory Visit (INDEPENDENT_AMBULATORY_CARE_PROVIDER_SITE_OTHER): Payer: BLUE CROSS/BLUE SHIELD | Admitting: Family Medicine

## 2016-05-07 VITALS — BP 108/70 | HR 81 | Temp 97.9°F | Ht 67.0 in | Wt 170.2 lb

## 2016-05-07 DIAGNOSIS — R739 Hyperglycemia, unspecified: Secondary | ICD-10-CM | POA: Diagnosis not present

## 2016-05-07 DIAGNOSIS — E782 Mixed hyperlipidemia: Secondary | ICD-10-CM

## 2016-05-07 DIAGNOSIS — C73 Malignant neoplasm of thyroid gland: Secondary | ICD-10-CM

## 2016-05-07 DIAGNOSIS — K219 Gastro-esophageal reflux disease without esophagitis: Secondary | ICD-10-CM | POA: Diagnosis not present

## 2016-05-07 DIAGNOSIS — E663 Overweight: Secondary | ICD-10-CM

## 2016-05-07 DIAGNOSIS — E039 Hypothyroidism, unspecified: Secondary | ICD-10-CM

## 2016-05-07 DIAGNOSIS — E559 Vitamin D deficiency, unspecified: Secondary | ICD-10-CM

## 2016-05-07 NOTE — Progress Notes (Signed)
Pre visit review using our clinic review tool, if applicable. No additional management support is needed unless otherwise documented below in the visit note. 

## 2016-05-07 NOTE — Patient Instructions (Addendum)
Curcumen caps daily, NOW company at [email or web address] Probiotics 10 strain cap 1 cap daily  Basic Carbohydrate Counting for Diabetes Mellitus Carbohydrate counting is a method for keeping track of the amount of carbohydrates you eat. Eating carbohydrates naturally increases the level of sugar (glucose) in your blood, so it is important for you to know the amount that is okay for you to have in every meal. Carbohydrate counting helps keep the level of glucose in your blood within normal limits. The amount of carbohydrates allowed is different for every person. A dietitian can help you calculate the amount that is right for you. Once you know the amount of carbohydrates you can have, you can count the carbohydrates in the foods you want to eat. Carbohydrates are found in the following foods:  Grains, such as breads and cereals.  Dried beans and soy products.  Starchy vegetables, such as potatoes, peas, and corn.  Fruit and fruit juices.  Milk and yogurt.  Sweets and snack foods, such as cake, cookies, candy, chips, soft drinks, and fruit drinks. CARBOHYDRATE COUNTING There are two ways to count the carbohydrates in your food. You can use either of the methods or a combination of both. Reading the "Nutrition Facts" on [place] The "Nutrition Facts" is an area that is included on the labels of almost all packaged food and beverages in the [place]. It includes the serving size of that food or beverage and information about the nutrients in each serving of the food, including the grams (g) of carbohydrate per serving.  Decide the number of servings of this food or beverage that you will be able to eat or drink. Multiply that number of servings by the number of grams of carbohydrate that is listed on the label for that serving. The total will be the amount of carbohydrates you will be having when you eat or drink this food or beverage. Learning Standard Serving Sizes of Food When you eat  food that is not packaged or does not include "Nutrition Facts" on the label, you need to measure the servings in order to count the amount of carbohydrates.A serving of most carbohydrate-rich foods contains about 15 g of carbohydrates. The following list includes serving sizes of carbohydrate-rich foods that provide 15 g ofcarbohydrate per serving:   1 slice of bread (1 oz) or 1 six-inch tortilla.    of a hamburger bun or English muffin.  4-6 crackers.   cup unsweetened dry cereal.    cup hot cereal.   cup rice or pasta.    cup mashed potatoes or  of a large baked potato.  1 cup fresh fruit or one small piece of fruit.    cup canned or frozen fruit or fruit juice.  1 cup milk.   cup plain fat-free yogurt or yogurt sweetened with artificial sweeteners.   cup cooked dried beans or starchy vegetable, such as peas, corn, or potatoes.  Decide the number of standard-size servings that you will eat. Multiply that number of servings by 15 (the grams of carbohydrates in that serving). For example, if you eat 2 cups of strawberries, you will have eaten 2 servings and 30 g of carbohydrates (2 servings x 15 g = 30 g). For foods such as soups and casseroles, in which more than one food is mixed in, you will need to count the carbohydrates in each food that is included. EXAMPLE OF CARBOHYDRATE COUNTING Sample Dinner  3 oz chicken breast.   cup of brown rice.  cup of corn.  1 cup milk.   1 cup strawberries with sugar-free whipped topping.  Carbohydrate Calculation Step 1: Identify the foods that contain carbohydrates:   Rice.   Corn.   Milk.   Strawberries. Step 2:Calculate the number of servings eaten of each:   2 servings of rice.   1 serving of corn.   1 serving of milk.   1 serving of strawberries. Step 3: Multiply each of those number of servings by 15 g:   2 servings of rice x 15 g = 30 g.   1 serving of corn x 15 g = 15 g.   1  serving of milk x 15 g = 15 g.   1 serving of strawberries x 15 g = 15 g. Step 4: Add together all of the amounts to find the total grams of carbohydrates eaten: 30 g + 15 g + 15 g + 15 g = 75 g.   This information is not intended to replace advice given to you by your health care provider. Make sure you discuss any questions you have with your health care provider.   Document Released: 09/14/2005 Document Revised: 10/05/2014 Document Reviewed: 08/11/2013 Elsevier Interactive Patient Education [employer].

## 2016-05-17 NOTE — Assessment & Plan Note (Signed)
Encouraged DASH diet, decrease po intake and increase exercise as tolerated. Needs 7-8 hours of sleep nightly. Avoid trans fats, eat small, frequent meals every 4-5 hours with lean proteins, complex carbs and healthy fats. Minimize simple carbs 

## 2016-05-17 NOTE — Progress Notes (Signed)
Patient ID: [name], female   DOB: [birth year], 60 y.o.   MRN: [id number]   Subjective:    Patient ID: [name], female    DOB: [birth year], 60 y.o.   MRN: [id number]  Chief Complaint  Patient presents with  . Follow-up    HPI Patient is in today for follow up. She is worried about hyperglycemia. Denies polyuria or polypipsia. Is tryin gto minimize simple carbohydrates. Denies CP/palp/SOB/HA/congestion/fevers/GI or GU c/o. Taking meds as prescribed  Past Medical History:  Diagnosis Date  . Allergic rhinitis   . Barrett's esophagus without dysplasia    pt says was suspected never confirmed 9 or 10 years ago  . Esophagitis   . Fundic gland polyps of stomach, benign   . GERD (gastroesophageal reflux disease)   . Hemorrhoids   . History of hiatal hernia   . History of ovarian cyst   . History of thyroid cancer    05-01-2011  s/p  total thyroidectomy for multinodular goiter and papillary carcinoma  . Hyperglycemia 03/01/2016  . Hyperlipidemia    borderline  . Hypothyroidism, postsurgical   . Internal hemorrhoids   . Lumbar spondylosis    L4 -- S1  . Overweight 08/14/2009   Qualifier: Diagnosis of  By: Niel Hummer MD, Lorinda Creed   . Palpitations 03/01/2016  . PONV (postoperative nausea and vomiting)    nausea with pain meds  . TMJ (temporomandibular joint disorder)     Past Surgical History:  Procedure Laterality Date  . APPENDECTOMY  01-30-2008  . CARPAL TUNNEL RELEASE Right 08/30/2014   Procedure: RIGHT CARPAL TUNNEL RELEASE;  Surgeon: Linna Hoff, MD;  Location: [facility];  Service: Orthopedics;  Laterality: Right;  . COLONOSCOPY  08-08-2007  . HEMORRHOID SURGERY    . LAPAROSCOPIC RIGHT OOPHORECTOMY  1996  . LAPAROSCOPIC TUBAL LIGATION  07-21-2002   W/   Dilatation and evacuation  . LEFT SHOULDER CLOSED MANIPULATION/  ARTHROSCOPIC DEBRIDEMENT LABRUM/  SAD/ RELEASE  CA LIGAMENT/ BURSECTOMY  10-12-2011  . PARTIAL KNEE ARTHROPLASTY Right 12/09/2015    Procedure: RIGHT KNEE MEDIAL UNICOMPARTMENTAL ARTHROPLASTY ;  Surgeon: Gaynelle Arabian, MD;  Location: [facility];  Service: Orthopedics;  Laterality: Right;  . TOTAL THYROIDECTOMY  05-01-2011  . TRANSOBTURATOR SLING  01-07-2005   W/ CYSTOCELE REPAIR  . UPPER GASTROINTESTINAL ENDOSCOPY  last one 06-29-2014    Family History  Problem Relation Age of Onset  . Diabetes Father   . Cancer Father     prostate  . Stroke Father     bacterial endocarditis after prostate surgery  . Heart disease Father     aortic valvular disease, MI  . Thyroid disease Mother     hyperparathyroidism  . Alzheimer's disease Mother   . Hyperparathyroidism Mother   . Atrial fibrillation Mother   . Hypertension Mother   . Hyperlipidemia Mother   . Thyroid disease Maternal Grandmother   . Esophageal cancer Neg Hx   . Colon cancer Neg Hx   . Hyperlipidemia Brother   . Hypertension Brother   . Allergies Brother   . Heart disease Daughter     palpitations    Social History   Social History  . Marital status: Married    Spouse name: N/A  . Number of children: N/A  . Years of education: N/A   Occupational History  . Not on file.   Social History Main Topics  . Smoking status: Former Smoker    Years: 5.00  Types: Cigarettes    Start date: 09/28/1984    Quit date: 08/28/1985  . Smokeless tobacco: Never Used  . Alcohol use Yes     Comment: rarely  . Drug use: No  . Sexual activity: Not on file   Other Topics Concern  . Not on file   Social History Narrative  . No narrative on file    Outpatient Medications Prior to Visit  Medication Sig Dispense Refill  . esomeprazole (NEXIUM) 20 MG capsule Take 1-2 capsules (20-40 mg total) by mouth daily at 12 noon. Reported on 01/24/2016 60 capsule 3  . hydrocortisone cream 1 % Apply 1 application topically 2 (two) times daily.     . ranitidine (ZANTAC) 150 MG tablet Take 1 tablet (150 mg total) by mouth at bedtime. 30 tablet 3  . SYNTHROID 125 MCG tablet    1   No facility-administered medications prior to visit.     Allergies  Allergen Reactions  . Celestone [Betamethasone Sodium Phosphate] Other (See Comments)    "Feels hot and looks sunburned"  . Droperidol Other (See Comments)    "feels like I have withdrawal symptoms"  . Omni-Pac [Cefdinir] Diarrhea and Other (See Comments)    Other Reaction: Other reaction  . Statins Other (See Comments)    "joint stiffness"  . Hydrocodone Itching and Rash    Review of Systems  Constitutional: Negative for fever and malaise/fatigue.  HENT: Negative for congestion.   Eyes: Negative for blurred vision.  Respiratory: Negative for shortness of breath.   Cardiovascular: Negative for chest pain, palpitations and leg swelling.  Gastrointestinal: Negative for abdominal pain, blood in stool and nausea.  Genitourinary: Negative for dysuria and frequency.  Musculoskeletal: Negative for falls.  Skin: Negative for rash.  Neurological: Negative for dizziness, loss of consciousness and headaches.  Endo/Heme/Allergies: Negative for environmental allergies.  Psychiatric/Behavioral: Negative for depression. The patient is not nervous/anxious.        Objective:    Physical Exam  Constitutional: She is oriented to person, place, and time. She appears well-developed and well-nourished. No distress.  HENT:  Head: Normocephalic and atraumatic.  Nose: Nose normal.  Eyes: Right eye exhibits no discharge. Left eye exhibits no discharge.  Neck: Normal range of motion. Neck supple.  Cardiovascular: Normal rate and regular rhythm.   No murmur heard. Pulmonary/Chest: Effort normal and breath sounds normal.  Abdominal: Soft. Bowel sounds are normal. There is no tenderness.  Musculoskeletal: She exhibits no edema.  Neurological: She is alert and oriented to person, place, and time.  Skin: Skin is warm and dry.  Psychiatric: She has a normal mood and affect.  Nursing note and vitals reviewed.   BP 108/70 (BP  Location: Left Arm, Patient Position: Sitting, Cuff Size: Normal)   Pulse 81   Temp 97.9 F (36.6 C) (Oral)   Ht 5\' 7"  (1.702 m)   Wt 170 lb 4 oz (77.2 kg)   SpO2 97%   BMI 26.66 kg/m  Wt Readings from Last 3 Encounters:  05/07/16 170 lb 4 oz (77.2 kg)  02/20/16 171 lb 6 oz (77.7 kg)  02/19/16 173 lb (78.5 kg)     Lab Results  Component Value Date   WBC 4.9 02/21/2016   HGB 13.3 02/21/2016   HCT 39.7 02/21/2016   PLT 252.0 02/21/2016   GLUCOSE 114 (H) 02/21/2016   CHOL 237 (H) 02/21/2016   TRIG 106.0 02/21/2016   HDL 43.50 02/21/2016   LDLDIRECT 212.6 08/10/2012   LDLCALC 173 (  H) 02/21/2016   ALT 15 02/21/2016   AST 15 02/21/2016   NA 141 02/21/2016   K 4.3 02/21/2016   CL 105 02/21/2016   CREATININE 0.74 02/21/2016   BUN 14 02/21/2016   CO2 29 02/21/2016   TSH 2.59 03/23/2016   INR 1.10 12/03/2015   HGBA1C 5.9 02/21/2016    Lab Results  Component Value Date   TSH 2.59 03/23/2016   Lab Results  Component Value Date   WBC 4.9 02/21/2016   HGB 13.3 02/21/2016   HCT 39.7 02/21/2016   MCV 86.7 02/21/2016   PLT 252.0 02/21/2016   Lab Results  Component Value Date   NA 141 02/21/2016   K 4.3 02/21/2016   CO2 29 02/21/2016   GLUCOSE 114 (H) 02/21/2016   BUN 14 02/21/2016   CREATININE 0.74 02/21/2016   BILITOT 0.4 02/21/2016   ALKPHOS 82 02/21/2016   AST 15 02/21/2016   ALT 15 02/21/2016   PROT 6.7 02/21/2016   ALBUMIN 4.5 02/21/2016   CALCIUM 9.2 02/21/2016   ANIONGAP 8 12/11/2015   GFR 85.47 02/21/2016   Lab Results  Component Value Date   CHOL 237 (H) 02/21/2016   Lab Results  Component Value Date   HDL 43.50 02/21/2016   Lab Results  Component Value Date   LDLCALC 173 (H) 02/21/2016   Lab Results  Component Value Date   TRIG 106.0 02/21/2016   Lab Results  Component Value Date   CHOLHDL 5 02/21/2016   Lab Results  Component Value Date   HGBA1C 5.9 02/21/2016       Assessment & Plan:   Problem List Items Addressed This  Visit    Hypothyroidism    On Levothyroxine, continue to monitor      Relevant Orders   TSH   Mixed hyperlipidemia   Relevant Orders   Lipid panel   Overweight    Encouraged DASH diet, decrease po intake and increase exercise as tolerated. Needs 7-8 hours of sleep nightly. Avoid trans fats, eat small, frequent meals every 4-5 hours with lean proteins, complex carbs and healthy fats. Minimize simple carbs      GERD - Primary    Avoid offending foods, start probiotics. Do not eat large meals in late evening and consider raising head of bed.       Thyroid cancer ([place])   Relevant Orders   TSH   CBC   Hyperglycemia    minimize simple carbs. Increase exercise as tolerated.       Relevant Orders   CBC   Hemoglobin A1c   Comprehensive metabolic panel    Other Visit Diagnoses    Vitamin D deficiency       Relevant Orders   VITAMIN D 25 Hydroxy (Vit-D Deficiency, Fractures)      I am having Ms. Carraway maintain her hydrocortisone cream, esomeprazole, ranitidine, and SYNTHROID.  No orders of the defined types were placed in this encounter.    Penni Homans, MD

## 2016-05-17 NOTE — Assessment & Plan Note (Signed)
On Levothyroxine, continue to monitor 

## 2016-05-17 NOTE — Assessment & Plan Note (Signed)
minimize simple carbs. Increase exercise as tolerated.  

## 2016-05-17 NOTE — Assessment & Plan Note (Signed)
Avoid offending foods, start probiotics. Do not eat large meals in late evening and consider raising head of bed.  

## 2016-05-22 ENCOUNTER — Telehealth: Payer: Self-pay | Admitting: Gastroenterology

## 2016-05-22 ENCOUNTER — Other Ambulatory Visit: Payer: Self-pay

## 2016-05-22 DIAGNOSIS — R1030 Lower abdominal pain, unspecified: Secondary | ICD-10-CM

## 2016-05-22 DIAGNOSIS — R197 Diarrhea, unspecified: Secondary | ICD-10-CM

## 2016-05-22 NOTE — Telephone Encounter (Signed)
Patient thinks she may have IBS brought on by being under extreme stress for a many weeks. Family issues. Her symptoms are low abdominal cramping with an urgency to have a bowel movement. Stools are unformed but not watery diarrhea. She has been unable to determine any foods that cause this symptom. Her husband gave her a lomotil and this calmed her stomach and stopped the cramping urgency.

## 2016-05-22 NOTE — Telephone Encounter (Signed)
No recent travel. Her mother is in Hospice care and she has had C-diff in recently. Patient will hold all medications that stop loose stools. Collect the stool for testing, then she may resume what she needs to stay comfortable. Stay well hydrated.

## 2016-05-22 NOTE — Telephone Encounter (Signed)
Please ask the patient about travel history and any sick contacts. Send GI pathogen panel and also C. difficile if having watery diarrhea. Her symptoms could be related to stress and IBS- D, we'll do a trial of Donnatal 1 tab every 6 hours as needed. If continues to have persistent symptoms we'll consider further workup

## 2016-06-24 DIAGNOSIS — D225 Melanocytic nevi of trunk: Secondary | ICD-10-CM | POA: Diagnosis not present

## 2016-06-24 DIAGNOSIS — L821 Other seborrheic keratosis: Secondary | ICD-10-CM | POA: Diagnosis not present

## 2016-06-24 DIAGNOSIS — I781 Nevus, non-neoplastic: Secondary | ICD-10-CM | POA: Diagnosis not present

## 2016-07-16 ENCOUNTER — Ambulatory Visit: Payer: BLUE CROSS/BLUE SHIELD | Admitting: Family Medicine

## 2016-07-17 IMAGING — MG MM SCREENING BREAST TOMO BILATERAL
8 series · 8 of 24 positions shown · non-contrast
Comparison: Previous exam(s).

CLINICAL DATA: Screening.

EXAM:
DIGITAL SCREENING BILATERAL MAMMOGRAM WITH 3D TOMO WITH CAD

[R CC]
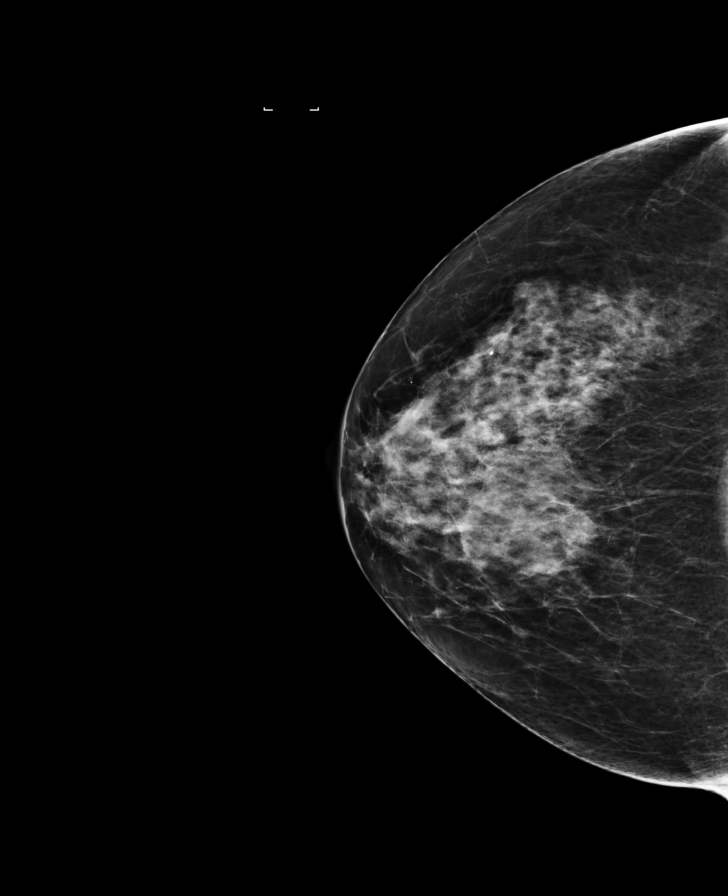

[L MLO]
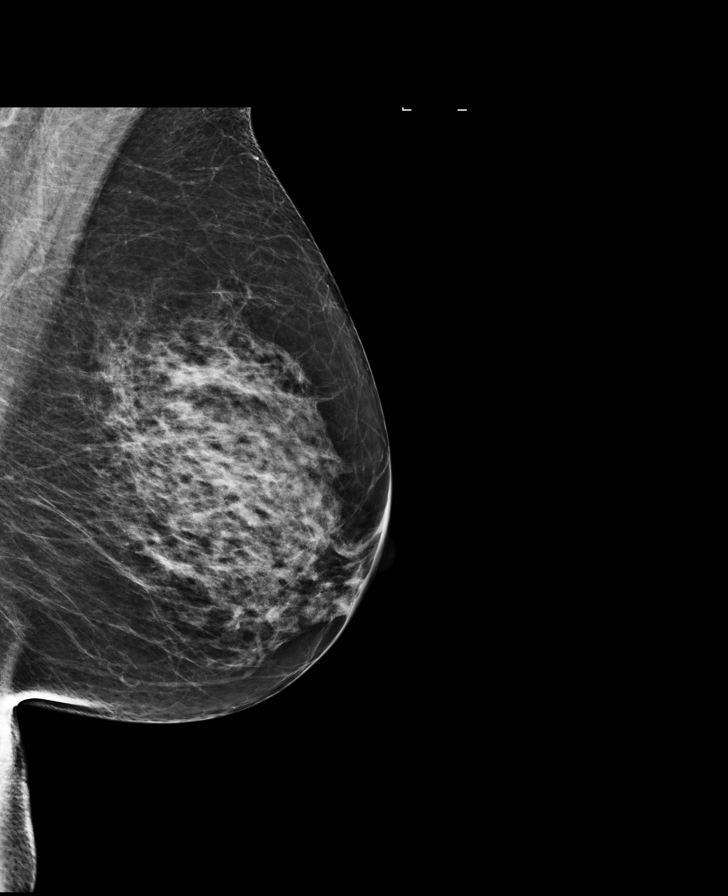

[R MLO]
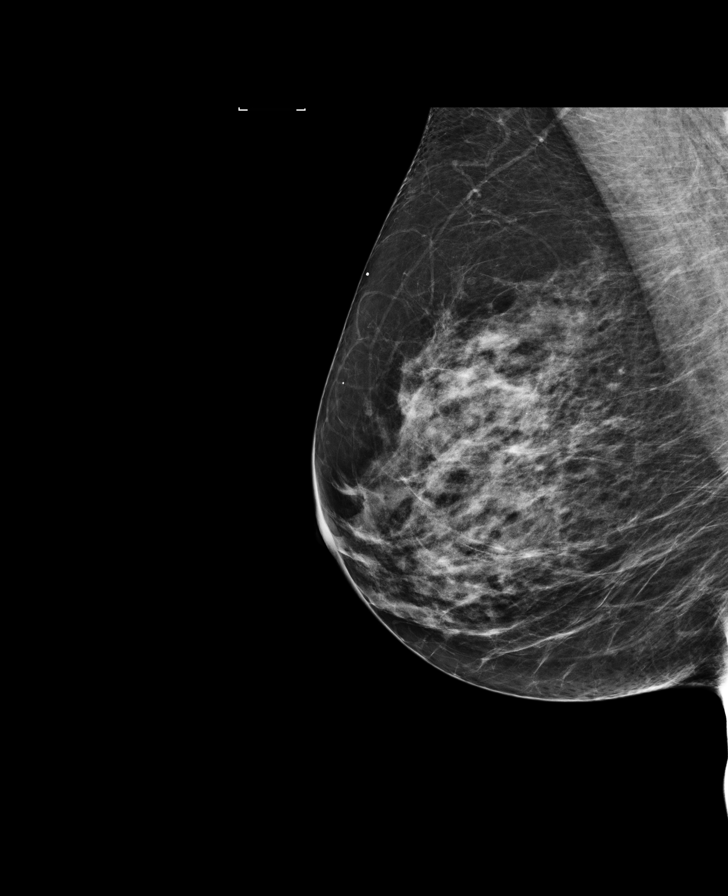

[L CC]
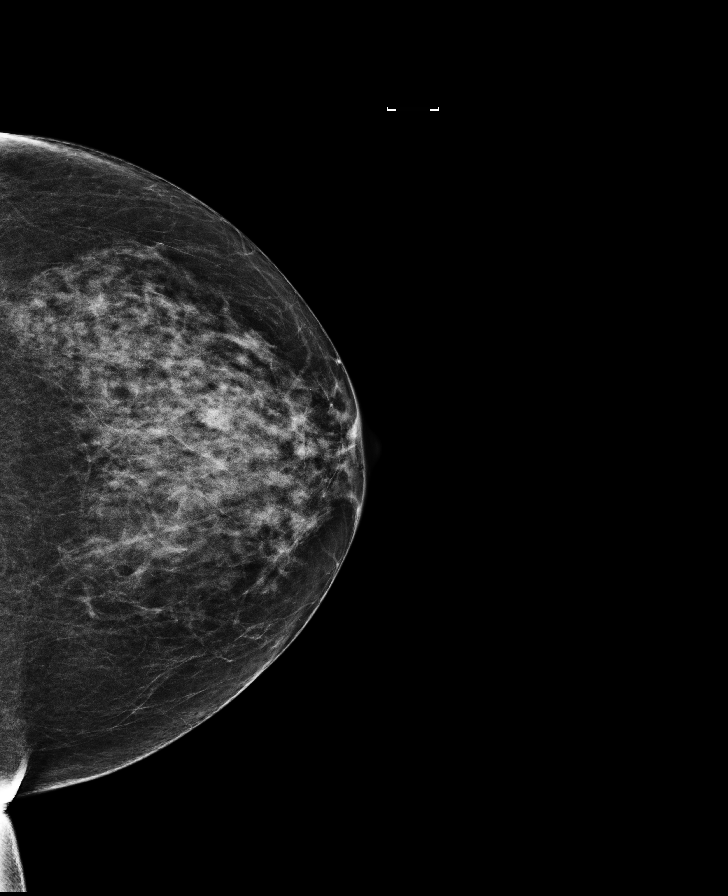

[L MLO tomo · tomo slice 33/64.0]
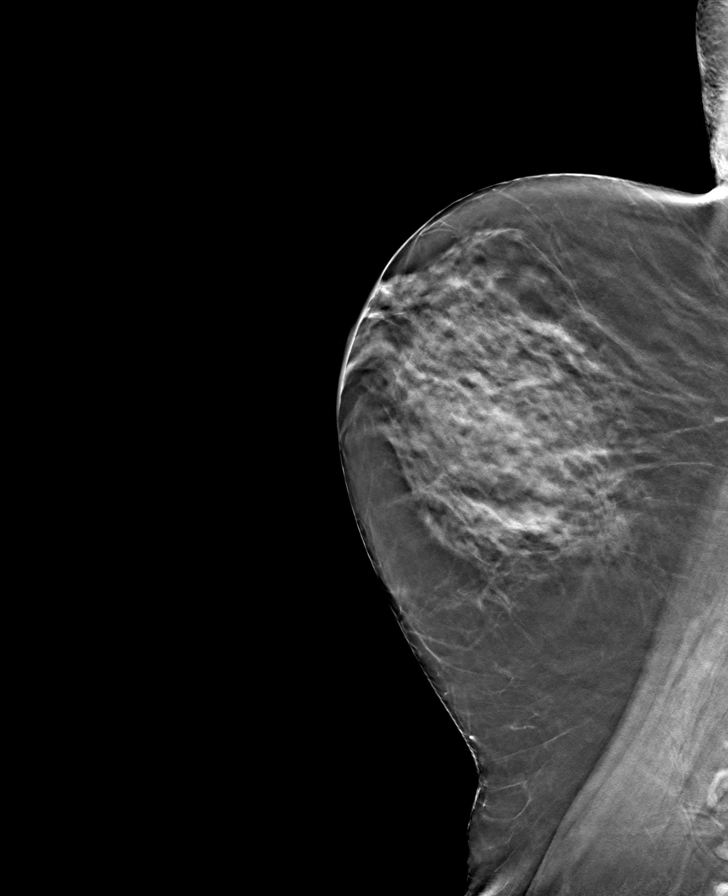

[L CC tomo · tomo slice 31/61.0]
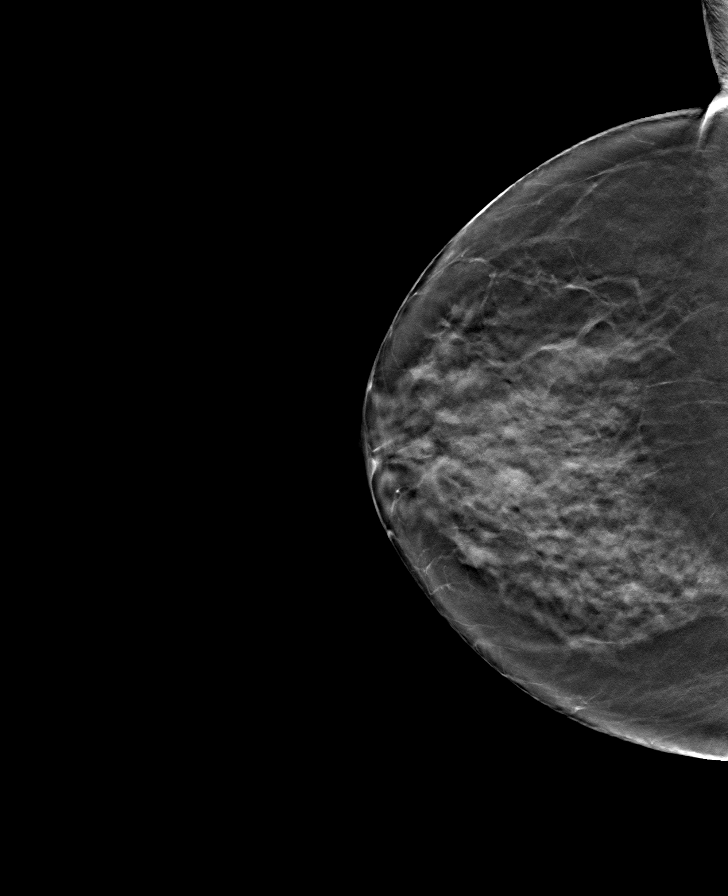

[R MLO tomo · tomo slice 33/65.0]
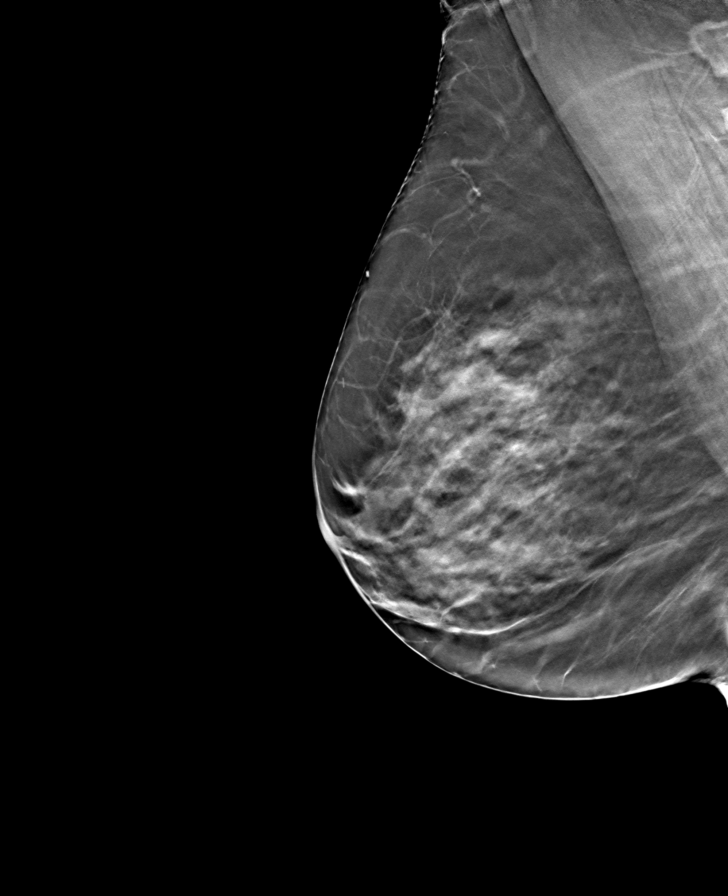

[R CC tomo · tomo slice 33/64.0]
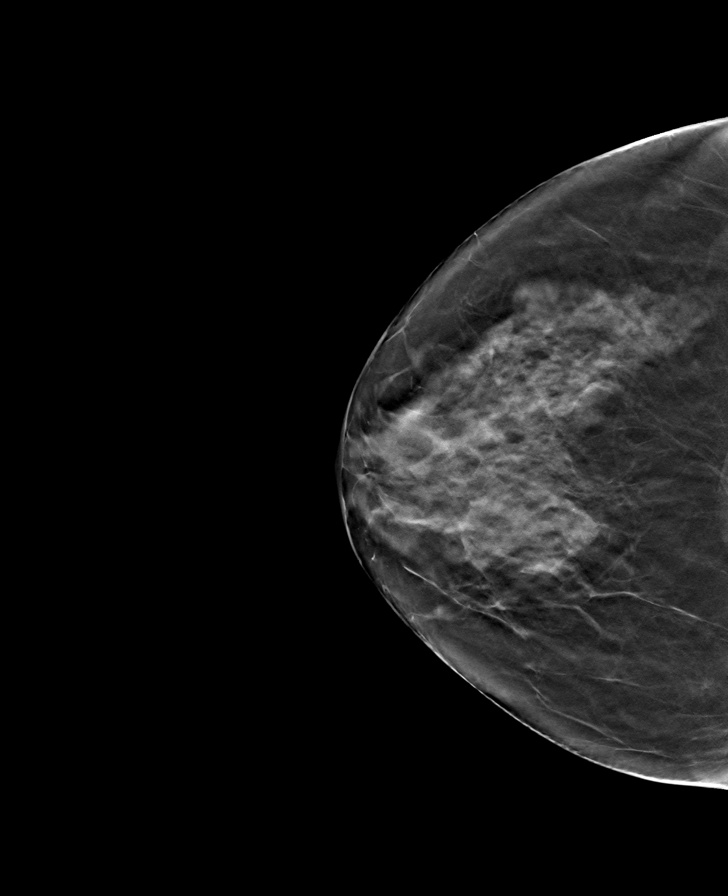

[8 of 24 positions shown; findings below may reference images not displayed]

ACR Breast Density Category c: The breast tissue is heterogeneously
dense, which may obscure small masses.
FINDINGS: There are no findings suspicious for malignancy. Images were
processed with CAD.
IMPRESSION: No mammographic evidence of malignancy. A result letter of this
screening mammogram will be mailed directly to the patient.

RECOMMENDATION:
Screening mammogram in one year. (Code:OA-G-1SS)

BI-RADS CATEGORY  1: Negative.

## 2016-07-21 DIAGNOSIS — F411 Generalized anxiety disorder: Secondary | ICD-10-CM | POA: Diagnosis not present

## 2016-07-21 DIAGNOSIS — F331 Major depressive disorder, recurrent, moderate: Secondary | ICD-10-CM | POA: Diagnosis not present

## 2016-08-06 ENCOUNTER — Other Ambulatory Visit: Payer: Self-pay | Admitting: Obstetrics and Gynecology

## 2016-08-06 DIAGNOSIS — Z1231 Encounter for screening mammogram for malignant neoplasm of breast: Secondary | ICD-10-CM

## 2016-08-07 ENCOUNTER — Other Ambulatory Visit: Payer: Self-pay | Admitting: Family Medicine

## 2016-08-07 ENCOUNTER — Other Ambulatory Visit (INDEPENDENT_AMBULATORY_CARE_PROVIDER_SITE_OTHER): Payer: BLUE CROSS/BLUE SHIELD

## 2016-08-07 DIAGNOSIS — R739 Hyperglycemia, unspecified: Secondary | ICD-10-CM

## 2016-08-07 DIAGNOSIS — C73 Malignant neoplasm of thyroid gland: Secondary | ICD-10-CM

## 2016-08-07 DIAGNOSIS — E559 Vitamin D deficiency, unspecified: Secondary | ICD-10-CM | POA: Diagnosis not present

## 2016-08-07 DIAGNOSIS — E039 Hypothyroidism, unspecified: Secondary | ICD-10-CM | POA: Diagnosis not present

## 2016-08-07 DIAGNOSIS — E782 Mixed hyperlipidemia: Secondary | ICD-10-CM | POA: Diagnosis not present

## 2016-08-07 LAB — LIPID PANEL
Cholesterol: 248 mg/dL — ABNORMAL HIGH (ref 0–200)
HDL: 58.6 mg/dL (ref >39.00)
LDL Cholesterol: 171 mg/dL — ABNORMAL HIGH (ref 0–99)
NonHDL: 189.88
Total CHOL/HDL Ratio: 4
Triglycerides: 95 mg/dL (ref 0.0–149.0)
VLDL: 19 mg/dL (ref 0.0–40.0)

## 2016-08-07 LAB — COMPREHENSIVE METABOLIC PANEL
ALT: 13 U/L (ref 0–35)
AST: 13 U/L (ref 0–37)
Albumin: 4.5 g/dL (ref 3.5–5.2)
Alkaline Phosphatase: 107 U/L (ref 39–117)
BUN: 16 mg/dL (ref 6–23)
CO2: 28 mEq/L (ref 19–32)
Calcium: 9.2 mg/dL (ref 8.4–10.5)
Chloride: 106 mEq/L (ref 96–112)
Creatinine, Ser: 0.78 mg/dL (ref 0.40–1.20)
GFR: 80.3 mL/min (ref >60.00)
Glucose, Bld: 96 mg/dL (ref 70–99)
Potassium: 4.5 mEq/L (ref 3.5–5.1)
Sodium: 144 mEq/L (ref 135–145)
Total Bilirubin: 0.4 mg/dL (ref 0.2–1.2)
Total Protein: 7.2 g/dL (ref 6.0–8.3)

## 2016-08-07 LAB — CBC
HCT: 40.4 % (ref 36.0–46.0)
Hemoglobin: 13.6 g/dL (ref 12.0–15.0)
MCHC: 33.5 g/dL (ref 30.0–36.0)
MCV: 86.5 fl (ref 78.0–100.0)
Platelets: 277 10*3/uL (ref 150.0–400.0)
RBC: 4.67 Mil/uL (ref 3.87–5.11)
RDW: 14.2 % (ref 11.5–15.5)
WBC: 4.8 10*3/uL (ref 4.0–10.5)

## 2016-08-07 LAB — TSH: TSH: 0.76 u[IU]/mL (ref 0.35–4.50)

## 2016-08-07 LAB — VITAMIN D 25 HYDROXY (VIT D DEFICIENCY, FRACTURES): VITD: 25.11 ng/mL — ABNORMAL LOW (ref 30.00–100.00)

## 2016-08-07 LAB — HEMOGLOBIN A1C: Hgb A1c MFr Bld: 5.8 % (ref 4.6–6.5)

## 2016-08-07 MED ORDER — VITAMIN D (ERGOCALCIFEROL) 1.25 MG (50000 UNIT) PO CAPS: 50000.0000 [IU] | ORAL_CAPSULE | ORAL | 0 refills | Status: DC

## 2016-08-10 ENCOUNTER — Telehealth: Payer: Self-pay | Admitting: Family Medicine

## 2016-08-10 ENCOUNTER — Ambulatory Visit (INDEPENDENT_AMBULATORY_CARE_PROVIDER_SITE_OTHER): Payer: BLUE CROSS/BLUE SHIELD | Admitting: Family Medicine

## 2016-08-10 ENCOUNTER — Encounter: Payer: Self-pay | Admitting: Family Medicine

## 2016-08-10 DIAGNOSIS — K219 Gastro-esophageal reflux disease without esophagitis: Secondary | ICD-10-CM

## 2016-08-10 DIAGNOSIS — F411 Generalized anxiety disorder: Secondary | ICD-10-CM

## 2016-08-10 DIAGNOSIS — E782 Mixed hyperlipidemia: Secondary | ICD-10-CM

## 2016-08-10 DIAGNOSIS — R739 Hyperglycemia, unspecified: Secondary | ICD-10-CM

## 2016-08-10 DIAGNOSIS — E039 Hypothyroidism, unspecified: Secondary | ICD-10-CM

## 2016-08-10 MED ORDER — ROSUVASTATIN CALCIUM 5 MG PO TABS: ORAL_TABLET | ORAL | 5 refills | Status: DC

## 2016-08-10 NOTE — Progress Notes (Signed)
Pre visit review using our clinic review tool, if applicable. No additional management support is needed unless otherwise documented below in the visit note. 

## 2016-08-10 NOTE — Patient Instructions (Signed)

## 2016-08-10 NOTE — Telephone Encounter (Signed)
Ok to recheck labs prior to appt as long as it has been 90 days. Check vitamin D, cmp and lipid

## 2016-08-10 NOTE — Telephone Encounter (Signed)
Caller name: Relationship to patient: Self Can be reached: [phone] Pharmacy:  Reason for call: Patient wants to know if she needs to get labs before coming back in 3 months

## 2016-08-11 ENCOUNTER — Other Ambulatory Visit: Payer: Self-pay | Admitting: Family Medicine

## 2016-08-11 DIAGNOSIS — E559 Vitamin D deficiency, unspecified: Secondary | ICD-10-CM

## 2016-08-11 DIAGNOSIS — E785 Hyperlipidemia, unspecified: Secondary | ICD-10-CM

## 2016-08-11 NOTE — Telephone Encounter (Signed)
Patient informed of instructions. Put order in for the [place] lab expected date 11/18/2016

## 2016-08-17 ENCOUNTER — Ambulatory Visit
Admission: RE | Admit: 2016-08-17 | Discharge: 2016-08-17 | Disposition: A | Discharge status: Home / Self Care | Payer: BLUE CROSS/BLUE SHIELD | Source: Ambulatory Visit | Attending: Obstetrics and Gynecology | Admitting: Obstetrics and Gynecology

## 2016-08-17 DIAGNOSIS — Z1231 Encounter for screening mammogram for malignant neoplasm of breast: Secondary | ICD-10-CM

## 2016-08-24 ENCOUNTER — Encounter: Payer: Self-pay | Admitting: Family Medicine

## 2016-08-24 DIAGNOSIS — F411 Generalized anxiety disorder: Secondary | ICD-10-CM

## 2016-08-24 DIAGNOSIS — F419 Anxiety disorder, unspecified: Secondary | ICD-10-CM | POA: Insufficient documentation

## 2016-08-24 DIAGNOSIS — F32A Depression, unspecified: Secondary | ICD-10-CM | POA: Insufficient documentation

## 2016-08-24 HISTORY — DX: Generalized anxiety disorder: F41.1

## 2016-08-24 NOTE — Assessment & Plan Note (Signed)
On Levothyroxine, continue to monitor 

## 2016-08-24 NOTE — Progress Notes (Signed)
Patient ID: [name], female   DOB: [birth year], 60 y.o.   MRN: [id number]   Subjective:    Patient ID: [name], female    DOB: 19-Oct-1955, 60 y.o.   MRN: [id number]  Chief Complaint  Patient presents with  . Follow-up    HPI Patient is in today for follow up. She is struggling with significant stress due to a separation from her husband but she feels she is doing the correct thing after all of his cheating over the years. She denies any recent illness. No suicidal ideation but does note some anxiety and palpitations at times. No other associated symptoms. Denies CP/SOB/HA/congestion/fevers/GI or GU c/o. Taking meds as prescribed  Past Medical History:  Diagnosis Date  . Allergic rhinitis   . Anxiety state 08/24/2016  . Barrett's esophagus without dysplasia    pt says was suspected never confirmed 9 or 10 years ago  . Esophagitis   . Fundic gland polyps of stomach, benign   . GERD (gastroesophageal reflux disease)   . Hemorrhoids   . History of hiatal hernia   . History of ovarian cyst   . History of thyroid cancer    05-01-2011  s/p  total thyroidectomy for multinodular goiter and papillary carcinoma  . Hyperglycemia 03/01/2016  . Hyperlipidemia    borderline  . Hypothyroidism, postsurgical   . Internal hemorrhoids   . Lumbar spondylosis    L4 -- S1  . Overweight 08/14/2009   Qualifier: Diagnosis of  By: Niel Hummer MD, Lorinda Creed   . Palpitations 03/01/2016  . PONV (postoperative nausea and vomiting)    nausea with pain meds  . TMJ (temporomandibular joint disorder)     Past Surgical History:  Procedure Laterality Date  . APPENDECTOMY  01-30-2008  . CARPAL TUNNEL RELEASE Right 08/30/2014   Procedure: RIGHT CARPAL TUNNEL RELEASE;  Surgeon: Linna Hoff, MD;  Location: [facility];  Service: Orthopedics;  Laterality: Right;  . COLONOSCOPY  08-08-2007  . HEMORRHOID SURGERY    . LAPAROSCOPIC RIGHT OOPHORECTOMY  1996  . LAPAROSCOPIC TUBAL LIGATION   07-21-2002   W/   Dilatation and evacuation  . LEFT SHOULDER CLOSED MANIPULATION/  ARTHROSCOPIC DEBRIDEMENT LABRUM/  SAD/ RELEASE  CA LIGAMENT/ BURSECTOMY  10-12-2011  . PARTIAL KNEE ARTHROPLASTY Right 12/09/2015   Procedure: RIGHT KNEE MEDIAL UNICOMPARTMENTAL ARTHROPLASTY ;  Surgeon: Gaynelle Arabian, MD;  Location: [facility];  Service: Orthopedics;  Laterality: Right;  . TOTAL THYROIDECTOMY  05-01-2011  . TRANSOBTURATOR SLING  01-07-2005   W/ CYSTOCELE REPAIR  . UPPER GASTROINTESTINAL ENDOSCOPY  last one 06-29-2014    Family History  Problem Relation Age of Onset  . Diabetes Father   . Cancer Father     prostate  . Stroke Father     bacterial endocarditis after prostate surgery  . Heart disease Father     aortic valvular disease, MI  . Thyroid disease Mother     hyperparathyroidism  . Alzheimer's disease Mother   . Hyperparathyroidism Mother   . Atrial fibrillation Mother   . Hypertension Mother   . Hyperlipidemia Mother   . Thyroid disease Maternal Grandmother   . Esophageal cancer Neg Hx   . Colon cancer Neg Hx   . Hyperlipidemia Brother   . Hypertension Brother   . Allergies Brother   . Heart disease Daughter     palpitations    Social History   Social History  . Marital status: Married    Spouse name: N/A  .  Number of children: N/A  . Years of education: N/A   Occupational History  . Not on file.   Social History Main Topics  . Smoking status: Former Smoker    Years: 5.00    Types: Cigarettes    Start date: 09/28/1984    Quit date: 08/28/1985  . Smokeless tobacco: Never Used  . Alcohol use Yes     Comment: rarely  . Drug use: No  . Sexual activity: Not on file   Other Topics Concern  . Not on file   Social History Narrative  . No narrative on file    Outpatient Medications Prior to Visit  Medication Sig Dispense Refill  . esomeprazole (NEXIUM) 20 MG capsule Take 1-2 capsules (20-40 mg total) by mouth daily at 12 noon. Reported on 01/24/2016 60 capsule 3   . hydrocortisone cream 1 % Apply 1 application topically 2 (two) times daily.     . ranitidine (ZANTAC) 150 MG tablet Take 1 tablet (150 mg total) by mouth at bedtime. 30 tablet 3  . SYNTHROID 125 MCG tablet   1  . Vitamin D, Ergocalciferol, (DRISDOL) 50000 units CAPS capsule Take 1 capsule (50,000 Units total) by mouth every 7 (seven) days. 12 capsule 0   No facility-administered medications prior to visit.     Allergies  Allergen Reactions  . Celestone [Betamethasone Sodium Phosphate] Other (See Comments)    "Feels hot and looks sunburned"  . Droperidol Other (See Comments)    "feels like I have withdrawal symptoms"  . Omni-Pac [Cefdinir] Diarrhea and Other (See Comments)    Other Reaction: Other reaction  . Statins Other (See Comments)    "joint stiffness"  . Hydrocodone Itching and Rash    Review of Systems  Constitutional: Negative for chills, fever and malaise/fatigue.  HENT: Negative for congestion and hearing loss.   Eyes: Negative for blurred vision and discharge.  Respiratory: Negative for cough, sputum production and shortness of breath.   Cardiovascular: Positive for palpitations. Negative for chest pain and leg swelling.  Gastrointestinal: Negative for abdominal pain, blood in stool, constipation, diarrhea, heartburn, nausea and vomiting.  Genitourinary: Negative for dysuria, frequency, hematuria and urgency.  Musculoskeletal: Negative for back pain, falls and myalgias.  Skin: Negative for rash.  Neurological: Negative for dizziness, sensory change, loss of consciousness, weakness and headaches.  Endo/Heme/Allergies: Negative for environmental allergies. Does not bruise/bleed easily.  Psychiatric/Behavioral: Negative for depression and suicidal ideas. The patient is nervous/anxious. The patient does not have insomnia.        Objective:    Physical Exam  Constitutional: She is oriented to person, place, and time. She appears well-developed and well-nourished. No  distress.  HENT:  Head: Normocephalic and atraumatic.  Nose: Nose normal.  Eyes: Right eye exhibits no discharge. Left eye exhibits no discharge.  Neck: Normal range of motion. Neck supple.  Cardiovascular: Normal rate and regular rhythm.   No murmur heard. Pulmonary/Chest: Effort normal and breath sounds normal.  Abdominal: Soft. Bowel sounds are normal. There is no tenderness.  Musculoskeletal: She exhibits no edema.  Neurological: She is alert and oriented to person, place, and time.  Skin: Skin is warm and dry.  Psychiatric: She has a normal mood and affect.  Nursing note and vitals reviewed.   BP 110/74 (BP Location: Left Arm, Patient Position: Sitting, Cuff Size: Normal)   Pulse 85   Temp 97.7 F (36.5 C) (Oral)   Ht 5\' 7"  (1.702 m)   Wt 172 lb 6 oz (  78.2 kg)   SpO2 96%   BMI 27.00 kg/m  Wt Readings from Last 3 Encounters:  08/10/16 172 lb 6 oz (78.2 kg)  05/07/16 170 lb 4 oz (77.2 kg)  02/20/16 171 lb 6 oz (77.7 kg)     Lab Results  Component Value Date   WBC 4.8 08/07/2016   HGB 13.6 08/07/2016   HCT 40.4 08/07/2016   PLT 277.0 08/07/2016   GLUCOSE 96 08/07/2016   CHOL 248 (H) 08/07/2016   TRIG 95.0 08/07/2016   HDL 58.60 08/07/2016   LDLDIRECT 212.6 08/10/2012   LDLCALC 171 (H) 08/07/2016   ALT 13 08/07/2016   AST 13 08/07/2016   NA 144 08/07/2016   K 4.5 08/07/2016   CL 106 08/07/2016   CREATININE 0.78 08/07/2016   BUN 16 08/07/2016   CO2 28 08/07/2016   TSH 0.76 08/07/2016   INR 1.10 12/03/2015   HGBA1C 5.8 08/07/2016    Lab Results  Component Value Date   TSH 0.76 08/07/2016   Lab Results  Component Value Date   WBC 4.8 08/07/2016   HGB 13.6 08/07/2016   HCT 40.4 08/07/2016   MCV 86.5 08/07/2016   PLT 277.0 08/07/2016   Lab Results  Component Value Date   NA 144 08/07/2016   K 4.5 08/07/2016   CO2 28 08/07/2016   GLUCOSE 96 08/07/2016   BUN 16 08/07/2016   CREATININE 0.78 08/07/2016   BILITOT 0.4 08/07/2016   ALKPHOS 107  08/07/2016   AST 13 08/07/2016   ALT 13 08/07/2016   PROT 7.2 08/07/2016   ALBUMIN 4.5 08/07/2016   CALCIUM 9.2 08/07/2016   ANIONGAP 8 12/11/2015   GFR 80.30 08/07/2016   Lab Results  Component Value Date   CHOL 248 (H) 08/07/2016   Lab Results  Component Value Date   HDL 58.60 08/07/2016   Lab Results  Component Value Date   LDLCALC 171 (H) 08/07/2016   Lab Results  Component Value Date   TRIG 95.0 08/07/2016   Lab Results  Component Value Date   CHOLHDL 4 08/07/2016   Lab Results  Component Value Date   HGBA1C 5.8 08/07/2016       Assessment & Plan:   Problem List Items Addressed This Visit    Hypothyroidism    On Levothyroxine, continue to monitor      Mixed hyperlipidemia    Tolerating statin, encouraged heart healthy diet, avoid trans fats, minimize simple carbs and saturated fats. Increase exercise as tolerated      Relevant Medications   rosuvastatin (CRESTOR) 5 MG tablet   GERD    Still struggles but with Ranitdine and Nexium is managing. Avoid offending foods, start probiotics. Do not eat large meals in late evening and consider raising head of bed.       Hyperglycemia    minimize simple carbs. Increase exercise as tolerated. Continue current meds      Anxiety state    Is separating from her husband and is under a great deal of stress but is managing well all things considered, no medicaitons at this time. Consider counseling.          I am having Ms. Moffit start on rosuvastatin. I am also having her maintain her hydrocortisone cream, esomeprazole, ranitidine, SYNTHROID, and Vitamin D (Ergocalciferol).  Meds ordered this encounter  Medications  . rosuvastatin (CRESTOR) 5 MG tablet    Sig: 1 tab po q Tuesday and Saturday.    Dispense:  10 tablet  Refill:  5     Penni Homans, MD

## 2016-08-24 NOTE — Assessment & Plan Note (Signed)
minimize simple carbs. Increase exercise as tolerated. Continue current meds  

## 2016-08-24 NOTE — Assessment & Plan Note (Signed)
Tolerating statin, encouraged heart healthy diet, avoid trans fats, minimize simple carbs and saturated fats. Increase exercise as tolerated 

## 2016-08-24 NOTE — Assessment & Plan Note (Signed)
Still struggles but with Ranitdine and Nexium is managing. Avoid offending foods, start probiotics. Do not eat large meals in late evening and consider raising head of bed.

## 2016-08-24 NOTE — Assessment & Plan Note (Signed)
Is separating from her husband and is under a great deal of stress but is managing well all things considered, no medicaitons at this time. Consider counseling.

## 2016-08-26 ENCOUNTER — Ambulatory Visit (INDEPENDENT_AMBULATORY_CARE_PROVIDER_SITE_OTHER): Payer: BLUE CROSS/BLUE SHIELD | Admitting: Licensed Clinical Social Worker

## 2016-08-26 DIAGNOSIS — F3341 Major depressive disorder, recurrent, in partial remission: Secondary | ICD-10-CM | POA: Diagnosis not present

## 2016-09-01 ENCOUNTER — Ambulatory Visit (INDEPENDENT_AMBULATORY_CARE_PROVIDER_SITE_OTHER): Payer: BLUE CROSS/BLUE SHIELD | Admitting: Family Medicine

## 2016-09-01 DIAGNOSIS — R739 Hyperglycemia, unspecified: Secondary | ICD-10-CM | POA: Diagnosis not present

## 2016-09-01 DIAGNOSIS — E782 Mixed hyperlipidemia: Secondary | ICD-10-CM | POA: Diagnosis not present

## 2016-09-01 DIAGNOSIS — J069 Acute upper respiratory infection, unspecified: Secondary | ICD-10-CM

## 2016-09-01 DIAGNOSIS — E039 Hypothyroidism, unspecified: Secondary | ICD-10-CM

## 2016-09-01 DIAGNOSIS — K219 Gastro-esophageal reflux disease without esophagitis: Secondary | ICD-10-CM | POA: Diagnosis not present

## 2016-09-01 MED ORDER — DOXYCYCLINE HYCLATE 100 MG PO CAPS: 100.0000 mg | ORAL_CAPSULE | Freq: Two times a day (BID) | ORAL | 0 refills | Status: DC

## 2016-09-01 MED ORDER — MONTELUKAST SODIUM 10 MG PO TABS: 10.0000 mg | ORAL_TABLET | Freq: Every day | ORAL | 1 refills | Status: DC | PRN

## 2016-09-01 NOTE — Progress Notes (Signed)
Pre visit review using our clinic review tool, if applicable. No additional management support is needed unless otherwise documented below in the visit note. 

## 2016-09-01 NOTE — Patient Instructions (Addendum)
2 sprays each nostril once a day of Flonase Zyrtec 1 time a day Elderberry Zinc Aged Garlic 500mg  of Vitamin C NOW Probiotic  Claritin 2 times a day Plain Mucinex 2 times a day Plenty of Fluids   Upper Respiratory Infection, Adult Most upper respiratory infections (URIs) are a viral infection of the air passages leading to the lungs. A URI affects the nose, throat, and upper air passages. The most common type of URI is nasopharyngitis and is typically referred to as "the common cold." URIs run their course and usually go away on their own. Most of the time, a URI does not require medical attention, but sometimes a bacterial infection in the upper airways can follow a viral infection. This is called a secondary infection. Sinus and middle ear infections are common types of secondary upper respiratory infections. Bacterial pneumonia can also complicate a URI. A URI can worsen asthma and chronic obstructive pulmonary disease (COPD). Sometimes, these complications can require emergency medical care and may be life threatening. What are the causes? Almost all URIs are caused by viruses. A virus is a type of germ and can spread from one person to another. What increases the risk? You may be at risk for a URI if:  You smoke.  You have chronic heart or lung disease.  You have a weakened defense (immune) system.  You are very young or very old.  You have nasal allergies or asthma.  You work in crowded or poorly ventilated areas.  You work in health care facilities or schools. What are the signs or symptoms? Symptoms typically develop 2-3 days after you come in contact with a cold virus. Most viral URIs last 7-10 days. However, viral URIs from the influenza virus (flu virus) can last 14-18 days and are typically more severe. Symptoms may include:  Runny or stuffy (congested) nose.  Sneezing.  Cough.  Sore throat.  Headache.  Fatigue.  Fever.  Loss of appetite.  Pain in  your forehead, behind your eyes, and over your cheekbones (sinus pain).  Muscle aches. How is this diagnosed? Your health care provider may diagnose a URI by:  Physical exam.  Tests to check that your symptoms are not due to another condition such as:  Strep throat.  Sinusitis.  Pneumonia.  Asthma. How is this treated? A URI goes away on its own with time. It cannot be cured with medicines, but medicines may be prescribed or recommended to relieve symptoms. Medicines may help:  Reduce your fever.  Reduce your cough.  Relieve nasal congestion. Follow these instructions at home:  Take medicines only as directed by your health care provider.  Gargle warm saltwater or take cough drops to comfort your throat as directed by your health care provider.  Use a warm mist humidifier or inhale steam from a shower to increase air moisture. This may make it easier to breathe.  Drink enough fluid to keep your urine clear or pale yellow.  Eat soups and other clear broths and maintain good nutrition.  Rest as needed.  Return to work when your temperature has returned to normal or as your health care provider advises. You may need to stay home longer to avoid infecting others. You can also use a face mask and careful hand washing to prevent spread of the virus.  Increase the usage of your inhaler if you have asthma.  Do not use any tobacco products, including cigarettes, chewing tobacco, or electronic cigarettes. If you need help quitting, ask  your health care provider. How is this prevented? The best way to protect yourself from getting a cold is to practice good hygiene.  Avoid oral or hand contact with people with cold symptoms.  Wash your hands often if contact occurs. There is no clear evidence that vitamin C, vitamin E, echinacea, or exercise reduces the chance of developing a cold. However, it is always recommended to get plenty of rest, exercise, and practice good  nutrition. Contact a health care provider if:  You are getting worse rather than better.  Your symptoms are not controlled by medicine.  You have chills.  You have worsening shortness of breath.  You have brown or red mucus.  You have yellow or brown nasal discharge.  You have pain in your face, especially when you bend forward.  You have a fever.  You have swollen neck glands.  You have pain while swallowing.  You have white areas in the back of your throat. Get help right away if:  You have severe or persistent:  Headache.  Ear pain.  Sinus pain.  Chest pain.  You have chronic lung disease and any of the following:  Wheezing.  Prolonged cough.  Coughing up blood.  A change in your usual mucus.  You have a stiff neck.  You have changes in your:  Vision.  Hearing.  Thinking.  Mood. This information is not intended to replace advice given to you by your health care provider. Make sure you discuss any questions you have with your health care provider. Document Released: 03/10/2001 Document Revised: 05/17/2016 Document Reviewed: 12/20/2013 Elsevier Interactive Patient Education  2017 [employer].

## 2016-09-01 NOTE — Progress Notes (Signed)
Patient ID: [name], female   DOB: [birth year], 60 y.o.   MRN: [id number]   Subjective:    Patient ID: [name], female    DOB: [birth year], 60 y.o.   MRN: [id number]  Chief Complaint  Patient presents with  . Bleeding/Bruising    on left leg.  . 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 They are nontender. She is noting congestion with PND and cough productive of yellow phlegm. Also endorses, ear pain, sore throat, headache, and headache. No fevers, chills. Denies CP/palp/SOB/HA/congestion/fevers/GI or GU c/o. Taking meds as prescribed  Past Medical History:  Diagnosis Date  . Acute upper respiratory infection 09/13/2016  . Allergic rhinitis   . Anxiety state 08/24/2016  . Barrett's esophagus without dysplasia    pt says was suspected never confirmed 9 or 10 years ago  . Esophagitis   . Fundic gland polyps of stomach, benign   . GERD (gastroesophageal reflux disease)   . Hemorrhoids   . History of hiatal hernia   . History of ovarian cyst   . History of thyroid cancer    05-01-2011  s/p  total thyroidectomy for multinodular goiter and papillary carcinoma  . Hyperglycemia 03/01/2016  . Hyperlipidemia    borderline  . Hypothyroidism, postsurgical   . Internal hemorrhoids   . Lumbar spondylosis    L4 -- S1  . Overweight 08/14/2009   Qualifier: Diagnosis of  By: Niel Hummer MD, Lorinda Creed   . Palpitations 03/01/2016  . PONV (postoperative nausea and vomiting)    nausea with pain meds  . TMJ (temporomandibular joint disorder)     Past Surgical History:  Procedure Laterality Date  . APPENDECTOMY  01-30-2008  . CARPAL TUNNEL RELEASE Right 08/30/2014   Procedure: RIGHT CARPAL TUNNEL RELEASE;  Surgeon: Linna Hoff, MD;  Location: [facility];  Service: Orthopedics;  Laterality: Right;    . COLONOSCOPY  08-08-2007  . HEMORRHOID SURGERY    . LAPAROSCOPIC RIGHT OOPHORECTOMY  1996  . LAPAROSCOPIC TUBAL LIGATION  07-21-2002   W/   Dilatation and evacuation  . LEFT SHOULDER CLOSED MANIPULATION/  ARTHROSCOPIC DEBRIDEMENT LABRUM/  SAD/ RELEASE  CA LIGAMENT/ BURSECTOMY  10-12-2011  . PARTIAL KNEE ARTHROPLASTY Right 12/09/2015   Procedure: RIGHT KNEE MEDIAL UNICOMPARTMENTAL ARTHROPLASTY ;  Surgeon: Gaynelle Arabian, MD;  Location: [facility];  Service: Orthopedics;  Laterality: Right;  . TOTAL THYROIDECTOMY  05-01-2011  . TRANSOBTURATOR SLING  01-07-2005   W/ CYSTOCELE REPAIR  . UPPER GASTROINTESTINAL ENDOSCOPY  last one 06-29-2014    Family History  Problem Relation Age of Onset  . Diabetes Father   . Cancer Father     prostate  . Stroke Father     bacterial endocarditis after prostate surgery  . Heart disease Father     aortic valvular disease, MI  . Thyroid disease Mother     hyperparathyroidism  . Alzheimer's disease Mother   . Hyperparathyroidism Mother   . Atrial fibrillation Mother   . Hypertension Mother   . Hyperlipidemia Mother   . Thyroid disease Maternal Grandmother   . Esophageal cancer Neg Hx   . Colon cancer Neg Hx   . Hyperlipidemia Brother   . Hypertension Brother   . Allergies Brother   . Heart  disease Daughter     palpitations    Social History   Social History  . Marital status: Married    Spouse name: N/A  . Number of children: N/A  . Years of education: N/A   Occupational History  . Not on file.   Social History Main Topics  . Smoking status: Former Smoker    Years: 5.00    Types: Cigarettes    Start date: 09/28/1984    Quit date: 08/28/1985  . Smokeless tobacco: Never Used  . Alcohol use Yes     Comment: rarely  . Drug use: No  . Sexual activity: Not on file   Other Topics Concern  . Not on file   Social History Narrative  . No narrative on file    Outpatient Medications Prior to Visit  Medication Sig Dispense Refill  .  hydrocortisone cream 1 % Apply 1 application topically 2 (two) times daily.     . ranitidine (ZANTAC) 150 MG tablet Take 1 tablet (150 mg total) by mouth at bedtime. 30 tablet 3  . rosuvastatin (CRESTOR) 5 MG tablet 1 tab po q Tuesday and Saturday. 10 tablet 5  . SYNTHROID 125 MCG tablet   1  . Vitamin D, Ergocalciferol, (DRISDOL) 50000 units CAPS capsule Take 1 capsule (50,000 Units total) by mouth every 7 (seven) days. 12 capsule 0  . esomeprazole (NEXIUM) 20 MG capsule Take 1-2 capsules (20-40 mg total) by mouth daily at 12 noon. Reported on 01/24/2016 60 capsule 3   No facility-administered medications prior to visit.     Allergies  Allergen Reactions  . Celestone [Betamethasone Sodium Phosphate] Other (See Comments)    "Feels hot and looks sunburned"  . Droperidol Other (See Comments)    "feels like I have withdrawal symptoms"  . Omni-Pac [Cefdinir] Diarrhea and Other (See Comments)    Other Reaction: Other reaction  . Statins Other (See Comments)    "joint stiffness"  . Hydrocodone Itching and Rash    Review of Systems  Constitutional: Positive for malaise/fatigue. Negative for fever.  HENT: Positive for congestion, ear pain and sore throat.   Eyes: Negative for blurred vision.  Respiratory: Positive for sputum production. Negative for cough and shortness of breath.   Cardiovascular: Negative for chest pain and palpitations.  Gastrointestinal: Negative for vomiting.  Musculoskeletal: Negative for back pain.  Skin: Negative for rash.  Neurological: Negative for loss of consciousness and headaches.  Endo/Heme/Allergies: Bruises/bleeds easily.       Objective:    Physical Exam  Constitutional: She is oriented to person, place, and time. She appears well-developed and well-nourished. No distress.  HENT:  Head: Normocephalic and atraumatic.  TMs dull and retracted. Nasal mucosa boggy and erythematous  Eyes: Conjunctivae are normal.  Neck: Normal range of motion. No  thyromegaly present.  Cardiovascular: Normal rate and regular rhythm.   Pulmonary/Chest: Effort normal and breath sounds normal. She has no wheezes.  Abdominal: Soft. Bowel sounds are normal. There is no tenderness.  Musculoskeletal: Normal range of motion. She exhibits no edema or deformity.  Lymphadenopathy:    She has cervical adenopathy.  Neurological: She is alert and oriented to person, place, and time.  Skin: Skin is warm and dry. No rash noted. She is not diaphoretic.  1-2 cm bruises on legs x 3  Psychiatric: She has a normal mood and affect.    BP 100/68 (BP Location: Left Arm, Patient Position: Sitting, Cuff Size: Normal)   Pulse (!) 113  Temp 98.2 F (36.8 C) (Oral)   Wt 175 lb 9.6 oz (79.7 kg)   SpO2 97%   BMI 27.50 kg/m  Wt Readings from Last 3 Encounters:  09/01/16 175 lb 9.6 oz (79.7 kg)  08/10/16 172 lb 6 oz (78.2 kg)  05/07/16 170 lb 4 oz (77.2 kg)     Lab Results  Component Value Date   WBC 4.8 08/07/2016   HGB 13.6 08/07/2016   HCT 40.4 08/07/2016   PLT 277.0 08/07/2016   GLUCOSE 96 08/07/2016   CHOL 248 (H) 08/07/2016   TRIG 95.0 08/07/2016   HDL 58.60 08/07/2016   LDLDIRECT 212.6 08/10/2012   LDLCALC 171 (H) 08/07/2016   ALT 13 08/07/2016   AST 13 08/07/2016   NA 144 08/07/2016   K 4.5 08/07/2016   CL 106 08/07/2016   CREATININE 0.78 08/07/2016   BUN 16 08/07/2016   CO2 28 08/07/2016   TSH 0.76 08/07/2016   INR 1.10 12/03/2015   HGBA1C 5.8 08/07/2016    Lab Results  Component Value Date   TSH 0.76 08/07/2016   Lab Results  Component Value Date   WBC 4.8 08/07/2016   HGB 13.6 08/07/2016   HCT 40.4 08/07/2016   MCV 86.5 08/07/2016   PLT 277.0 08/07/2016   Lab Results  Component Value Date   NA 144 08/07/2016   K 4.5 08/07/2016   CO2 28 08/07/2016   GLUCOSE 96 08/07/2016   BUN 16 08/07/2016   CREATININE 0.78 08/07/2016   BILITOT 0.4 08/07/2016   ALKPHOS 107 08/07/2016   AST 13 08/07/2016   ALT 13 08/07/2016   PROT 7.2  08/07/2016   ALBUMIN 4.5 08/07/2016   CALCIUM 9.2 08/07/2016   ANIONGAP 8 12/11/2015   GFR 80.30 08/07/2016   Lab Results  Component Value Date   CHOL 248 (H) 08/07/2016   Lab Results  Component Value Date   HDL 58.60 08/07/2016   Lab Results  Component Value Date   LDLCALC 171 (H) 08/07/2016   Lab Results  Component Value Date   TRIG 95.0 08/07/2016   Lab Results  Component Value Date   CHOLHDL 4 08/07/2016   Lab Results  Component Value Date   HGBA1C 5.8 08/07/2016  I acted as a Education administrator for Dr. Charlett Blake. [name], RMA      Assessment & Plan:   Problem List Items Addressed This Visit    Hypothyroidism    On Levothyroxine, continue to monitor      Mixed hyperlipidemia    Encouraged heart healthy diet, increase exercise, avoid trans fats, consider a krill oil cap daily      GERD    Avoid offending foods, take probiotics. Do not eat large meals in late evening and consider raising head of bed.       Hyperglycemia    hgba1c acceptable, minimize simple carbs. Increase exercise as tolerated.       Acute upper respiratory infection    Encouraged increased rest and hydration, add probiotics, zinc such as Coldeze or Xicam. Treat fevers as needed. Doxycycline. Patient with a couple small bruises on legs. She was worried this might be erythema nodosum which she had previously but is not consistent with this.          I am having Ms. Moskowitz start on montelukast and doxycycline. I am also having her maintain her hydrocortisone cream, ranitidine, SYNTHROID, Vitamin D (Ergocalciferol), and rosuvastatin.  Meds ordered this encounter  Medications  . montelukast (SINGULAIR) 10 MG tablet  Sig: Take 1 tablet (10 mg total) by mouth daily as needed.    Dispense:  30 tablet    Refill:  1  . doxycycline (VIBRAMYCIN) 100 MG capsule    Sig: Take 1 capsule (100 mg total) by mouth 2 (two) times daily.    Dispense:  20 capsule    Refill:  0     Penni Homans, MD

## 2016-09-03 ENCOUNTER — Ambulatory Visit (INDEPENDENT_AMBULATORY_CARE_PROVIDER_SITE_OTHER): Payer: BLUE CROSS/BLUE SHIELD | Admitting: Licensed Clinical Social Worker

## 2016-09-03 DIAGNOSIS — F3341 Major depressive disorder, recurrent, in partial remission: Secondary | ICD-10-CM

## 2016-09-07 DIAGNOSIS — Z13820 Encounter for screening for osteoporosis: Secondary | ICD-10-CM | POA: Diagnosis not present

## 2016-09-07 DIAGNOSIS — Z01419 Encounter for gynecological examination (general) (routine) without abnormal findings: Secondary | ICD-10-CM | POA: Diagnosis not present

## 2016-09-07 DIAGNOSIS — Z6828 Body mass index (BMI) 28.0-28.9, adult: Secondary | ICD-10-CM | POA: Diagnosis not present

## 2016-09-08 ENCOUNTER — Other Ambulatory Visit: Payer: Self-pay | Admitting: Gastroenterology

## 2016-09-09 ENCOUNTER — Ambulatory Visit (INDEPENDENT_AMBULATORY_CARE_PROVIDER_SITE_OTHER): Payer: BLUE CROSS/BLUE SHIELD | Admitting: Licensed Clinical Social Worker

## 2016-09-09 DIAGNOSIS — F419 Anxiety disorder, unspecified: Secondary | ICD-10-CM

## 2016-09-13 ENCOUNTER — Encounter: Payer: Self-pay | Admitting: Family Medicine

## 2016-09-13 DIAGNOSIS — J069 Acute upper respiratory infection, unspecified: Secondary | ICD-10-CM

## 2016-09-13 HISTORY — DX: Acute upper respiratory infection, unspecified: J06.9

## 2016-09-13 NOTE — Assessment & Plan Note (Signed)
On Levothyroxine, continue to monitor 

## 2016-09-13 NOTE — Assessment & Plan Note (Signed)
Encouraged heart healthy diet, increase exercise, avoid trans fats, consider a krill oil cap daily 

## 2016-09-13 NOTE — Assessment & Plan Note (Signed)
[id number] acceptable, minimize simple carbs. Increase exercise as tolerated.  

## 2016-09-13 NOTE — Assessment & Plan Note (Signed)
Encouraged increased rest and hydration, add probiotics, zinc such as Coldeze or Xicam. Treat fevers as needed. Doxycycline. Patient with a couple small bruises on legs. She was worried this might be erythema nodosum which she had previously but is not consistent with this.

## 2016-09-13 NOTE — Assessment & Plan Note (Signed)
Avoid offending foods, take probiotics. Do not eat large meals in late evening and consider raising head of bed.  

## 2016-09-23 ENCOUNTER — Ambulatory Visit (INDEPENDENT_AMBULATORY_CARE_PROVIDER_SITE_OTHER): Payer: BLUE CROSS/BLUE SHIELD | Admitting: Licensed Clinical Social Worker

## 2016-09-23 DIAGNOSIS — F419 Anxiety disorder, unspecified: Secondary | ICD-10-CM

## 2016-10-01 ENCOUNTER — Ambulatory Visit: Payer: BLUE CROSS/BLUE SHIELD | Admitting: Licensed Clinical Social Worker

## 2016-10-06 DIAGNOSIS — M818 Other osteoporosis without current pathological fracture: Secondary | ICD-10-CM | POA: Diagnosis not present

## 2016-10-09 ENCOUNTER — Ambulatory Visit (INDEPENDENT_AMBULATORY_CARE_PROVIDER_SITE_OTHER): Payer: BLUE CROSS/BLUE SHIELD | Admitting: Licensed Clinical Social Worker

## 2016-10-09 DIAGNOSIS — F331 Major depressive disorder, recurrent, moderate: Secondary | ICD-10-CM

## 2016-10-21 ENCOUNTER — Encounter: Payer: Self-pay | Admitting: Family Medicine

## 2016-10-26 ENCOUNTER — Ambulatory Visit (INDEPENDENT_AMBULATORY_CARE_PROVIDER_SITE_OTHER): Payer: BLUE CROSS/BLUE SHIELD | Admitting: Licensed Clinical Social Worker

## 2016-10-26 DIAGNOSIS — F419 Anxiety disorder, unspecified: Secondary | ICD-10-CM | POA: Diagnosis not present

## 2016-11-05 ENCOUNTER — Other Ambulatory Visit (INDEPENDENT_AMBULATORY_CARE_PROVIDER_SITE_OTHER): Payer: BLUE CROSS/BLUE SHIELD

## 2016-11-05 DIAGNOSIS — E559 Vitamin D deficiency, unspecified: Secondary | ICD-10-CM | POA: Diagnosis not present

## 2016-11-05 DIAGNOSIS — E785 Hyperlipidemia, unspecified: Secondary | ICD-10-CM | POA: Diagnosis not present

## 2016-11-05 LAB — COMPREHENSIVE METABOLIC PANEL
ALT: 28 U/L (ref 0–35)
AST: 20 U/L (ref 0–37)
Albumin: 4.8 g/dL (ref 3.5–5.2)
Alkaline Phosphatase: 104 U/L (ref 39–117)
BUN: 14 mg/dL (ref 6–23)
CO2: 32 mEq/L (ref 19–32)
Calcium: 9.4 mg/dL (ref 8.4–10.5)
Chloride: 105 mEq/L (ref 96–112)
Creatinine, Ser: 0.69 mg/dL (ref 0.40–1.20)
GFR: 92.43 mL/min (ref >60.00)
Glucose, Bld: 103 mg/dL — ABNORMAL HIGH (ref 70–99)
Potassium: 4.6 mEq/L (ref 3.5–5.1)
Sodium: 142 mEq/L (ref 135–145)
Total Bilirubin: 0.4 mg/dL (ref 0.2–1.2)
Total Protein: 7.4 g/dL (ref 6.0–8.3)

## 2016-11-05 LAB — LIPID PANEL
Cholesterol: 226 mg/dL — ABNORMAL HIGH (ref 0–200)
HDL: 63.4 mg/dL (ref >39.00)
LDL Cholesterol: 145 mg/dL — ABNORMAL HIGH (ref 0–99)
NonHDL: 162.56
Total CHOL/HDL Ratio: 4
Triglycerides: 88 mg/dL (ref 0.0–149.0)
VLDL: 17.6 mg/dL (ref 0.0–40.0)

## 2016-11-05 LAB — VITAMIN D 25 HYDROXY (VIT D DEFICIENCY, FRACTURES): VITD: 36 ng/mL (ref 30.00–100.00)

## 2016-11-08 ENCOUNTER — Encounter: Payer: Self-pay | Admitting: Family Medicine

## 2016-11-09 ENCOUNTER — Other Ambulatory Visit: Payer: Self-pay | Admitting: Family Medicine

## 2016-11-09 MED ORDER — RANITIDINE HCL 150 MG PO TABS: 150.0000 mg | ORAL_TABLET | Freq: Every day | ORAL | 3 refills | Status: DC

## 2016-11-09 MED ORDER — PROMETHAZINE HCL 25 MG RE SUPP: 25.0000 mg | Freq: Four times a day (QID) | RECTAL | 0 refills | Status: DC | PRN

## 2016-11-09 NOTE — Telephone Encounter (Signed)
Relation to [id number] Call back number: [phone]    Reason for call:  Patient scheduled for 02/01/2017 and would like to know if pre visit labs are necessary and if so would like orders place in [place]

## 2016-11-10 ENCOUNTER — Other Ambulatory Visit: Payer: Self-pay | Admitting: Family Medicine

## 2016-11-10 ENCOUNTER — Ambulatory Visit: Payer: Self-pay | Admitting: Family Medicine

## 2016-11-10 DIAGNOSIS — E559 Vitamin D deficiency, unspecified: Secondary | ICD-10-CM

## 2016-11-10 DIAGNOSIS — E785 Hyperlipidemia, unspecified: Secondary | ICD-10-CM

## 2016-11-10 DIAGNOSIS — E039 Hypothyroidism, unspecified: Secondary | ICD-10-CM

## 2016-11-10 DIAGNOSIS — E108 Type 1 diabetes mellitus with unspecified complications: Secondary | ICD-10-CM

## 2016-11-17 ENCOUNTER — Ambulatory Visit (INDEPENDENT_AMBULATORY_CARE_PROVIDER_SITE_OTHER): Payer: BLUE CROSS/BLUE SHIELD | Admitting: Licensed Clinical Social Worker

## 2016-11-17 DIAGNOSIS — F419 Anxiety disorder, unspecified: Secondary | ICD-10-CM

## 2016-11-30 ENCOUNTER — Other Ambulatory Visit: Payer: Self-pay | Admitting: Gastroenterology

## 2016-12-01 ENCOUNTER — Ambulatory Visit (INDEPENDENT_AMBULATORY_CARE_PROVIDER_SITE_OTHER): Payer: BLUE CROSS/BLUE SHIELD | Admitting: Licensed Clinical Social Worker

## 2016-12-01 DIAGNOSIS — F419 Anxiety disorder, unspecified: Secondary | ICD-10-CM | POA: Diagnosis not present

## 2016-12-07 ENCOUNTER — Ambulatory Visit (INDEPENDENT_AMBULATORY_CARE_PROVIDER_SITE_OTHER): Payer: BLUE CROSS/BLUE SHIELD | Admitting: Licensed Clinical Social Worker

## 2016-12-07 DIAGNOSIS — F331 Major depressive disorder, recurrent, moderate: Secondary | ICD-10-CM

## 2017-01-29 ENCOUNTER — Other Ambulatory Visit: Payer: Self-pay | Admitting: Gastroenterology

## 2017-01-29 ENCOUNTER — Other Ambulatory Visit (INDEPENDENT_AMBULATORY_CARE_PROVIDER_SITE_OTHER): Payer: BLUE CROSS/BLUE SHIELD

## 2017-01-29 DIAGNOSIS — E559 Vitamin D deficiency, unspecified: Secondary | ICD-10-CM | POA: Diagnosis not present

## 2017-01-29 DIAGNOSIS — E108 Type 1 diabetes mellitus with unspecified complications: Secondary | ICD-10-CM

## 2017-01-29 DIAGNOSIS — E785 Hyperlipidemia, unspecified: Secondary | ICD-10-CM

## 2017-01-29 DIAGNOSIS — E039 Hypothyroidism, unspecified: Secondary | ICD-10-CM

## 2017-01-29 LAB — COMPREHENSIVE METABOLIC PANEL
ALT: 14 U/L (ref 0–35)
AST: 15 U/L (ref 0–37)
Albumin: 4.5 g/dL (ref 3.5–5.2)
Alkaline Phosphatase: 91 U/L (ref 39–117)
BUN: 16 mg/dL (ref 6–23)
CO2: 25 mEq/L (ref 19–32)
Calcium: 9.2 mg/dL (ref 8.4–10.5)
Chloride: 111 mEq/L (ref 96–112)
Creatinine, Ser: 0.78 mg/dL (ref 0.40–1.20)
GFR: 80.17 mL/min (ref >60.00)
Glucose, Bld: 90 mg/dL (ref 70–99)
Potassium: 4.1 mEq/L (ref 3.5–5.1)
Sodium: 146 mEq/L — ABNORMAL HIGH (ref 135–145)
Total Bilirubin: 0.5 mg/dL (ref 0.2–1.2)
Total Protein: 7 g/dL (ref 6.0–8.3)

## 2017-01-29 LAB — CBC
HCT: 41 % (ref 36.0–46.0)
Hemoglobin: 13.7 g/dL (ref 12.0–15.0)
MCHC: 33.5 g/dL (ref 30.0–36.0)
MCV: 87.7 fl (ref 78.0–100.0)
Platelets: 246 10*3/uL (ref 150.0–400.0)
RBC: 4.67 Mil/uL (ref 3.87–5.11)
RDW: 13.8 % (ref 11.5–15.5)
WBC: 5.1 10*3/uL (ref 4.0–10.5)

## 2017-01-29 LAB — LIPID PANEL
Cholesterol: 185 mg/dL (ref 0–200)
HDL: 58.4 mg/dL (ref >39.00)
LDL Cholesterol: 108 mg/dL — ABNORMAL HIGH (ref 0–99)
NonHDL: 126.7
Total CHOL/HDL Ratio: 3
Triglycerides: 93 mg/dL (ref 0.0–149.0)
VLDL: 18.6 mg/dL (ref 0.0–40.0)

## 2017-01-29 LAB — VITAMIN D 25 HYDROXY (VIT D DEFICIENCY, FRACTURES): VITD: 51.27 ng/mL (ref 30.00–100.00)

## 2017-01-29 LAB — HEMOGLOBIN A1C: Hgb A1c MFr Bld: 6 % (ref 4.6–6.5)

## 2017-02-01 ENCOUNTER — Encounter: Payer: Self-pay | Admitting: Family Medicine

## 2017-02-01 ENCOUNTER — Ambulatory Visit (INDEPENDENT_AMBULATORY_CARE_PROVIDER_SITE_OTHER): Payer: BLUE CROSS/BLUE SHIELD | Admitting: Family Medicine

## 2017-02-01 VITALS — BP 98/68 | HR 85 | Temp 98.0°F | Resp 18 | Wt 176.8 lb

## 2017-02-01 DIAGNOSIS — R002 Palpitations: Secondary | ICD-10-CM

## 2017-02-01 DIAGNOSIS — E782 Mixed hyperlipidemia: Secondary | ICD-10-CM | POA: Diagnosis not present

## 2017-02-01 DIAGNOSIS — R739 Hyperglycemia, unspecified: Secondary | ICD-10-CM | POA: Diagnosis not present

## 2017-02-01 DIAGNOSIS — E663 Overweight: Secondary | ICD-10-CM

## 2017-02-01 DIAGNOSIS — K219 Gastro-esophageal reflux disease without esophagitis: Secondary | ICD-10-CM

## 2017-02-01 MED ORDER — VITAMIN D (ERGOCALCIFEROL) 1.25 MG (50000 UNIT) PO CAPS: 50000.0000 [IU] | ORAL_CAPSULE | ORAL | 1 refills | Status: DC

## 2017-02-01 MED ORDER — KRILL OIL 500 MG PO CAPS: 1.0000 | ORAL_CAPSULE | Freq: Every day | ORAL | Status: DC

## 2017-02-01 MED ORDER — RANITIDINE HCL 150 MG PO TABS: 150.0000 mg | ORAL_TABLET | Freq: Every day | ORAL | 3 refills | Status: DC

## 2017-02-01 MED ORDER — VITAMIN C 500 MG PO TABS: 500.0000 mg | ORAL_TABLET | Freq: Every day | ORAL | Status: DC

## 2017-02-01 NOTE — Progress Notes (Signed)
Pre visit review using our clinic review tool, if applicable. No additional management support is needed unless otherwise documented below in the visit note. 

## 2017-02-01 NOTE — Assessment & Plan Note (Signed)
Improved despite her being under a great deal of stress with her kids moving away, a bad marriage and a difficult neighbor.

## 2017-02-01 NOTE — Assessment & Plan Note (Signed)
Avoid offending foods, start probiotics. Do not eat large meals in late evening and consider raising head of bed.  

## 2017-02-01 NOTE — Assessment & Plan Note (Signed)
Encouraged DASH diet, decrease po intake and increase exercise as tolerated. Needs 7-8 hours of sleep nightly. Avoid trans fats, eat small, frequent meals every 4-5 hours with lean proteins, complex carbs and healthy fats. Minimize simple carbs 

## 2017-02-01 NOTE — Assessment & Plan Note (Signed)
[id number] acceptable, minimize simple carbs. Increase exercise as tolerated.  

## 2017-02-01 NOTE — Progress Notes (Signed)
Subjective:  I acted as a Education administrator for Dr. Charlett Blake. [name], [place]   Patient ID: [name], female    DOB: 1956/01/26, 61 y.o.   MRN: [id number]  Chief Complaint  Patient presents with  . Follow-up    HPI  Patient is in today for follow appointment. Following up on hyperlipidemia and other medical conditions. Patient seems fairly well today, she had lab work done last week and it was remarkable. Patient has no acute concerns. She continues to struggle with sadness due to kids moving away, a difficult marriage. Denies CP/palp/SOB/HA/congestion/fevers/GI or GU c/o. Taking meds as prescribed  Patient Care Team: Mosie Lukes, MD as PCP - General (Family Medicine)   Past Medical History:  Diagnosis Date  . Acute upper respiratory infection 09/13/2016  . Allergic rhinitis   . Anxiety state 08/24/2016  . Barrett's esophagus without dysplasia    pt says was suspected never confirmed 9 or 10 years ago  . Esophagitis   . Fundic gland polyps of stomach, benign   . GERD (gastroesophageal reflux disease)   . Hemorrhoids   . History of hiatal hernia   . History of ovarian cyst   . History of thyroid cancer    05-01-2011  s/p  total thyroidectomy for multinodular goiter and papillary carcinoma  . Hyperglycemia 03/01/2016  . Hyperlipidemia    borderline  . Hypothyroidism, postsurgical   . Internal hemorrhoids   . Lumbar spondylosis    L4 -- S1  . Overweight 08/14/2009   Qualifier: Diagnosis of  By: Niel Hummer MD, Lorinda Creed   . Palpitations 03/01/2016  . PONV (postoperative nausea and vomiting)    nausea with pain meds  . TMJ (temporomandibular joint disorder)     Past Surgical History:  Procedure Laterality Date  . APPENDECTOMY  01-30-2008  . CARPAL TUNNEL RELEASE Right 08/30/2014   Procedure: RIGHT CARPAL TUNNEL RELEASE;  Surgeon: Linna Hoff, MD;  Location: [facility];  Service: Orthopedics;  Laterality: Right;  . COLONOSCOPY  08-08-2007  . HEMORRHOID  SURGERY    . LAPAROSCOPIC RIGHT OOPHORECTOMY  1996  . LAPAROSCOPIC TUBAL LIGATION  07-21-2002   W/   Dilatation and evacuation  . LEFT SHOULDER CLOSED MANIPULATION/  ARTHROSCOPIC DEBRIDEMENT LABRUM/  SAD/ RELEASE  CA LIGAMENT/ BURSECTOMY  10-12-2011  . PARTIAL KNEE ARTHROPLASTY Right 12/09/2015   Procedure: RIGHT KNEE MEDIAL UNICOMPARTMENTAL ARTHROPLASTY ;  Surgeon: Gaynelle Arabian, MD;  Location: [facility];  Service: Orthopedics;  Laterality: Right;  . TOTAL THYROIDECTOMY  05-01-2011  . TRANSOBTURATOR SLING  01-07-2005   W/ CYSTOCELE REPAIR  . UPPER GASTROINTESTINAL ENDOSCOPY  last one 06-29-2014    Family History  Problem Relation Age of Onset  . Diabetes Father   . Cancer Father     prostate  . Stroke Father     bacterial endocarditis after prostate surgery  . Heart disease Father     aortic valvular disease, MI  . Thyroid disease Mother     hyperparathyroidism  . Alzheimer's disease Mother   . Hyperparathyroidism Mother   . Atrial fibrillation Mother   . Hypertension Mother   . Hyperlipidemia Mother   . Thyroid disease Maternal Grandmother   . Esophageal cancer Neg Hx   . Colon cancer Neg Hx   . Hyperlipidemia Brother   . Hypertension Brother   . Allergies Brother   . Heart disease Daughter     palpitations    Social History   Social History  . Marital  status: Married    Spouse name: N/A  . Number of children: N/A  . Years of education: N/A   Occupational History  . Not on file.   Social History Main Topics  . Smoking status: Former Smoker    Years: 5.00    Types: Cigarettes    Start date: 09/28/1984    Quit date: 08/28/1985  . Smokeless tobacco: Never Used  . Alcohol use Yes     Comment: rarely  . Drug use: No  . Sexual activity: Not on file   Other Topics Concern  . Not on file   Social History Narrative  . No narrative on file    Outpatient Medications Prior to Visit  Medication Sig Dispense Refill  . esomeprazole (NEXIUM) 20 MG capsule TAKE 1-2  CAPSULES DAILY AT NOON. 60 capsule 0  . hydrocortisone cream 1 % Apply 1 application topically 2 (two) times daily.     . montelukast (SINGULAIR) 10 MG tablet Take 1 tablet (10 mg total) by mouth daily as needed. 30 tablet 1  . promethazine (PHENERGAN) 25 MG suppository Place 1 suppository (25 mg total) rectally every 6 (six) hours as needed for nausea or vomiting. 12 each 0  . rosuvastatin (CRESTOR) 5 MG tablet 1 tab po q Tuesday and Saturday. 10 tablet 5  . SYNTHROID 125 MCG tablet   1  . ranitidine (ZANTAC) 150 MG tablet Take 1 tablet (150 mg total) by mouth at bedtime. 30 tablet 3  . Vitamin D, Ergocalciferol, (DRISDOL) 50000 units CAPS capsule Take 1 capsule (50,000 Units total) by mouth every 7 (seven) days. 12 capsule 0  . doxycycline (VIBRAMYCIN) 100 MG capsule Take 1 capsule (100 mg total) by mouth 2 (two) times daily. 20 capsule 0   No facility-administered medications prior to visit.     Allergies  Allergen Reactions  . Celestone [Betamethasone Sodium Phosphate] Other (See Comments)    "Feels hot and looks sunburned"  . Droperidol Other (See Comments)    "feels like I have withdrawal symptoms"  . Omni-Pac [Cefdinir] Diarrhea and Other (See Comments)    Other Reaction: Other reaction  . Statins Other (See Comments)    "joint stiffness"  . Hydrocodone Itching and Rash    Review of Systems  Constitutional: Positive for malaise/fatigue. Negative for fever.  HENT: Negative for congestion.   Eyes: Negative for blurred vision.  Respiratory: Negative for shortness of breath.   Cardiovascular: Negative for chest pain, palpitations and leg swelling.  Gastrointestinal: Negative for abdominal pain, blood in stool and nausea.  Genitourinary: Negative for dysuria and frequency.  Musculoskeletal: Negative for falls.  Skin: Negative for rash.  Neurological: Negative for dizziness, loss of consciousness and headaches.  Endo/Heme/Allergies: Negative for environmental allergies.    Psychiatric/Behavioral: Positive for depression. The patient is not nervous/anxious.        Objective:    Physical Exam  Constitutional: She is oriented to person, place, and time. She appears well-developed and well-nourished. No distress.  HENT:  Head: Normocephalic and atraumatic.  Nose: Nose normal.  Eyes: Right eye exhibits no discharge. Left eye exhibits no discharge.  Neck: Normal range of motion. Neck supple.  Cardiovascular: Normal rate and regular rhythm.   No murmur heard. Pulmonary/Chest: Effort normal and breath sounds normal.  Abdominal: Soft. Bowel sounds are normal. There is no tenderness.  Musculoskeletal: She exhibits no edema.  Neurological: She is alert and oriented to person, place, and time.  Skin: Skin is warm and dry.  Psychiatric:  She has a normal mood and affect.  Nursing note and vitals reviewed.   BP 98/68 (BP Location: Left Arm, Patient Position: Sitting, Cuff Size: Normal)   Pulse 85   Temp 98 F (36.7 C) (Oral)   Resp 18   Wt 176 lb 12.8 oz (80.2 kg)   SpO2 98%   BMI 27.69 kg/m  Wt Readings from Last 3 Encounters:  02/01/17 176 lb 12.8 oz (80.2 kg)  09/01/16 175 lb 9.6 oz (79.7 kg)  08/10/16 172 lb 6 oz (78.2 kg)   BP Readings from Last 3 Encounters:  02/01/17 98/68  09/01/16 100/68  08/10/16 110/74      There is no immunization history on file for this patient.  Health Maintenance  Topic Date Due  . Hepatitis C Screening  October 30, 1955  . PNEUMOCOCCAL POLYSACCHARIDE VACCINE (1) 06/12/1959  . FOOT EXAM  06/12/1967  . OPHTHALMOLOGY EXAM  06/12/1967  . URINE MICROALBUMIN  06/12/1967  . HIV Screening  06/11/1972  . TETANUS/TDAP  06/11/1976  . INFLUENZA VACCINE  09/20/2017 (Originally 04/28/2017)  . HEMOGLOBIN A1C  08/01/2017  . COLONOSCOPY  08/07/2017  . PAP SMEAR  08/28/2017  . MAMMOGRAM  08/17/2018    Lab Results  Component Value Date   WBC 5.1 01/29/2017   HGB 13.7 01/29/2017   HCT 41.0 01/29/2017   PLT 246.0 01/29/2017    GLUCOSE 90 01/29/2017   CHOL 185 01/29/2017   TRIG 93.0 01/29/2017   HDL 58.40 01/29/2017   LDLDIRECT 212.6 08/10/2012   LDLCALC 108 (H) 01/29/2017   ALT 14 01/29/2017   AST 15 01/29/2017   NA 146 (H) 01/29/2017   K 4.1 01/29/2017   CL 111 01/29/2017   CREATININE 0.78 01/29/2017   BUN 16 01/29/2017   CO2 25 01/29/2017   TSH 0.76 08/07/2016   INR 1.10 12/03/2015   HGBA1C 6.0 01/29/2017    Lab Results  Component Value Date   TSH 0.76 08/07/2016   Lab Results  Component Value Date   WBC 5.1 01/29/2017   HGB 13.7 01/29/2017   HCT 41.0 01/29/2017   MCV 87.7 01/29/2017   PLT 246.0 01/29/2017   Lab Results  Component Value Date   NA 146 (H) 01/29/2017   K 4.1 01/29/2017   CO2 25 01/29/2017   GLUCOSE 90 01/29/2017   BUN 16 01/29/2017   CREATININE 0.78 01/29/2017   BILITOT 0.5 01/29/2017   ALKPHOS 91 01/29/2017   AST 15 01/29/2017   ALT 14 01/29/2017   PROT 7.0 01/29/2017   ALBUMIN 4.5 01/29/2017   CALCIUM 9.2 01/29/2017   ANIONGAP 8 12/11/2015   GFR 80.17 01/29/2017   Lab Results  Component Value Date   CHOL 185 01/29/2017   Lab Results  Component Value Date   HDL 58.40 01/29/2017   Lab Results  Component Value Date   LDLCALC 108 (H) 01/29/2017   Lab Results  Component Value Date   TRIG 93.0 01/29/2017   Lab Results  Component Value Date   CHOLHDL 3 01/29/2017   Lab Results  Component Value Date   HGBA1C 6.0 01/29/2017         Assessment & Plan:   Problem List Items Addressed This Visit    Mixed hyperlipidemia - Primary    Encouraged heart healthy diet, increase exercise, avoid trans fats, consider a krill oil cap daily      Overweight    Encouraged DASH diet, decrease po intake and increase exercise as tolerated. Needs 7-8 hours of sleep  nightly. Avoid trans fats, eat small, frequent meals every 4-5 hours with lean proteins, complex carbs and healthy fats. Minimize simple carbs      GERD    Avoid offending foods, start probiotics. Do  not eat large meals in late evening and consider raising head of bed.       Relevant Medications   ranitidine (ZANTAC) 150 MG tablet   Palpitations    Improved despite her being under a great deal of stress with her kids moving away, a bad marriage and a difficult neighbor.      Hyperglycemia    hgba1c acceptable, minimize simple carbs. Increase exercise as tolerated.         I have discontinued Ms. Lapinski's doxycycline. I am also having her start on Krill Oil and vitamin C. Additionally, I am having her maintain her hydrocortisone cream, SYNTHROID, rosuvastatin, montelukast, promethazine, esomeprazole, ranitidine, and Vitamin D (Ergocalciferol).  Meds ordered this encounter  Medications  . ranitidine (ZANTAC) 150 MG tablet    Sig: Take 1 tablet (150 mg total) by mouth at bedtime.    Dispense:  30 tablet    Refill:  3  . Krill Oil 500 MG CAPS    Sig: Take 1 capsule (500 mg total) by mouth daily.  . Vitamin D, Ergocalciferol, (DRISDOL) 50000 units CAPS capsule    Sig: Take 1 capsule (50,000 Units total) by mouth every 7 (seven) days.    Dispense:  12 capsule    Refill:  1  . vitamin C (ASCORBIC ACID) 500 MG tablet    Sig: Take 1 tablet (500 mg total) by mouth daily.    CMA served as Education administrator during this visit. History, Physical and Plan performed by medical provider. Documentation and orders reviewed and attested to.  Penni Homans, MD

## 2017-02-01 NOTE — Patient Instructions (Addendum)
"  NOW" Probiotic    Food Choices for Gastroesophageal Reflux Disease, Adult When you have gastroesophageal reflux disease (GERD), the foods you eat and your eating habits are very important. Choosing the right foods can help ease your discomfort. What guidelines do I need to follow?  Choose fruits, vegetables, whole grains, and low-fat dairy products.  Choose low-fat meat, fish, and poultry.  Limit fats such as oils, salad dressings, butter, nuts, and avocado.  Keep a food diary. This helps you identify foods that cause symptoms.  Avoid foods that cause symptoms. These may be different for everyone.  Eat small meals often instead of 3 large meals a day.  Eat your meals slowly, in a place where you are relaxed.  Limit fried foods.  Cook foods using methods other than frying.  Avoid drinking alcohol.  Avoid drinking large amounts of liquids with your meals.  Avoid bending over or lying down until 2-3 hours after eating. What foods are not recommended? These are some foods and drinks that may make your symptoms worse: Vegetables  Tomatoes. Tomato juice. Tomato and spaghetti sauce. Chili peppers. Onion and garlic. Horseradish. Fruits  Oranges, grapefruit, and lemon (fruit and juice). Meats  High-fat meats, fish, and poultry. This includes hot dogs, ribs, ham, sausage, salami, and bacon. Dairy  Whole milk and chocolate milk. Sour cream. Cream. Butter. Ice cream. Cream cheese. Drinks  Coffee and tea. Bubbly (carbonated) drinks or energy drinks. Condiments  Hot sauce. Barbecue sauce. Sweets/Desserts  Chocolate and cocoa. Donuts. Peppermint and spearmint. Fats and Oils  High-fat foods. This includes [place] fries and potato chips. Other  Vinegar. Strong spices. This includes black pepper, white pepper, red pepper, cayenne, curry powder, cloves, ginger, and chili powder. The items listed above may not be a complete list of foods and drinks to avoid. Contact your dietitian for  more information.  This information is not intended to replace advice given to you by your health care provider. Make sure you discuss any questions you have with your health care provider. Document Released: 03/15/2012 Document Revised: 02/20/2016 Document Reviewed: 07/19/2013 Elsevier Interactive Patient Education  2017 [employer].

## 2017-02-01 NOTE — Assessment & Plan Note (Signed)
Encouraged heart healthy diet, increase exercise, avoid trans fats, consider a krill oil cap daily 

## 2017-03-29 ENCOUNTER — Other Ambulatory Visit: Payer: Self-pay | Admitting: Gastroenterology

## 2017-03-29 ENCOUNTER — Other Ambulatory Visit: Payer: Self-pay | Admitting: Family Medicine

## 2017-04-25 ENCOUNTER — Telehealth: Payer: Self-pay | Admitting: Gastroenterology

## 2017-04-25 NOTE — Telephone Encounter (Signed)
Patient called in with acute onset lower abdominal discomfort, mucous with a small amount of blood in her stools for the past 24 hours. She thinks she ate something bad which caused this. No fevers, tolerating PO.  She is hoping to be seen in the office tomorrow if possible, she is leaving to go out of town on Tuesday. I told her she would be contacted but not sure of what, if anything is available tomorrow. Sounds like she may need routine blood work / stool studies. She had no prior diverticulosis noted on last colonoscopy.   I told her if she can't tolerate PO, has worsening pain, worsening bleeding in the interim overnight, she will need to go to the ER. She agreed.   Beth can you call her in the morning? Thanks  [employer]

## 2017-04-26 ENCOUNTER — Inpatient Hospital Stay: Admission: RE | Admit: 2017-04-26 | Payer: Self-pay | Source: Ambulatory Visit

## 2017-04-26 ENCOUNTER — Other Ambulatory Visit: Payer: Self-pay

## 2017-04-26 ENCOUNTER — Ambulatory Visit (INDEPENDENT_AMBULATORY_CARE_PROVIDER_SITE_OTHER): Payer: BLUE CROSS/BLUE SHIELD | Admitting: Physician Assistant

## 2017-04-26 ENCOUNTER — Encounter: Payer: Self-pay | Admitting: Physician Assistant

## 2017-04-26 ENCOUNTER — Other Ambulatory Visit (INDEPENDENT_AMBULATORY_CARE_PROVIDER_SITE_OTHER): Payer: BLUE CROSS/BLUE SHIELD

## 2017-04-26 VITALS — BP 100/70 | HR 88 | Ht 67.0 in | Wt 171.1 lb

## 2017-04-26 DIAGNOSIS — R197 Diarrhea, unspecified: Secondary | ICD-10-CM | POA: Diagnosis not present

## 2017-04-26 DIAGNOSIS — K625 Hemorrhage of anus and rectum: Secondary | ICD-10-CM

## 2017-04-26 DIAGNOSIS — R1032 Left lower quadrant pain: Secondary | ICD-10-CM

## 2017-04-26 DIAGNOSIS — R109 Unspecified abdominal pain: Secondary | ICD-10-CM | POA: Diagnosis not present

## 2017-04-26 LAB — BASIC METABOLIC PANEL
BUN: 9 mg/dL (ref 6–23)
CO2: 28 mEq/L (ref 19–32)
Calcium: 9 mg/dL (ref 8.4–10.5)
Chloride: 102 mEq/L (ref 96–112)
Creatinine, Ser: 0.77 mg/dL (ref 0.40–1.20)
GFR: 81.31 mL/min (ref >60.00)
Glucose, Bld: 123 mg/dL — ABNORMAL HIGH (ref 70–99)
Potassium: 3.7 mEq/L (ref 3.5–5.1)
Sodium: 139 mEq/L (ref 135–145)

## 2017-04-26 LAB — CBC
HCT: 41.4 % (ref 36.0–46.0)
Hemoglobin: 13.8 g/dL (ref 12.0–15.0)
MCHC: 33.3 g/dL (ref 30.0–36.0)
MCV: 88.8 fl (ref 78.0–100.0)
Platelets: 239 10*3/uL (ref 150.0–400.0)
RBC: 4.67 Mil/uL (ref 3.87–5.11)
RDW: 13.8 % (ref 11.5–15.5)
WBC: 10.6 10*3/uL — ABNORMAL HIGH (ref 4.0–10.5)

## 2017-04-26 MED ORDER — GLYCOPYRROLATE 2 MG PO TABS: 2.0000 mg | ORAL_TABLET | Freq: Two times a day (BID) | ORAL | 0 refills | Status: DC

## 2017-04-26 NOTE — Telephone Encounter (Signed)
Called patient. She reports she has passed mucous and blood this morning "instead of what should have been a bowel movement." Offered her an appointment first available today. She agrees to come in, but briefly discusses going to the ER. She states she wants "to get the best care possible" because she is leaving for [facility] tomorrow. She asks if she would get to see Dr Silverio Decamp if she went to the ER. Explained to her she would be evaluated by the ER provider.

## 2017-04-26 NOTE — Patient Instructions (Addendum)
Push fluids. Full liquid to bland diet.  We have sent the following medications to your pharmacy for you to pick up at your convenience: [facility]. 1. Robinul Forte 2 mg  Call us tomorrow after 8:00 am for results.  Ask for Amy's nurse.    You have been scheduled for a CT scan of the abdomen and pelvis at [facility] ([address]---this is in the same building as Press [facility]).   You are scheduled on Monday 04-26-2017.  at 4:30 pm. You should arrive at 4:00 PM prior to your appointment time for registration. Please follow the written instructions below on the day of your exam:  WARNING: IF YOU ARE ALLERGIC TO IODINE/X-RAY DYE, PLEASE NOTIFY RADIOLOGY IMMEDIATELY AT [phone]! YOU WILL BE GIVEN A 13 HOUR PREMEDICATION PREP.  1) Do not eat  Anything until  after (4 hours prior to your test) 2) You have been given 2 bottles of oral contrast to drink. The solution may taste               better if refrigerated, but do NOT add ice or any other liquid to this solution. Shake             well before drinking.    Drink 1 bottle of contrast @ 2:15 PM  (2 hours prior to your exam)  Drink 1 bottle of contrast @ 3:15 PM  (1 hour prior to your exam)  You may take any medications as prescribed with a small amount of water except for the following: Metformin, Glucophage, Glucovance, Avandamet, Riomet, Fortamet, Actoplus Met, Janumet, Glumetza or Metaglip. The above medications must be held the day of the exam AND 48 hours after the exam.  The purpose of you drinking the oral contrast is to aid in the visualization of your intestinal tract. The contrast solution may cause some diarrhea. Before your exam is started, you will be given a small amount of fluid to drink. Depending on your individual set of symptoms, you may also receive an intravenous injection of x-ray contrast/dye. Plan on being at [facility] for 30 minutes or long, depending on the type of exam you are  having performed.  If you have any questions regarding your exam or if you need to reschedule, you may call the CT department at [phone] between the hours of 8:00 am and 5:00 pm, Monday-Friday.  ________________________________________________________________________

## 2017-04-26 NOTE — Telephone Encounter (Signed)
Patient agrees to come in now for the lab work. Appointment,ent is at 1:30 pm today with Nicoletta Ba PA-C

## 2017-04-26 NOTE — Progress Notes (Signed)
Subjective:    Patient ID: [name], female    DOB: 1956/05/27, 61 y.o.   MRN: [id number]  HPI [name] is a pleasant 61 year old white female, established with [name]. She has history of Barrett's esophagus however on last EGD in 2015 biopsies did not show any Barrett's. She was recommended for 5 year interval follow-up., Chronic GERD, history of thyroid CA osteoarthritis. She has had prior colonoscopy last done in 2008 per [name] negative except exception of internal hemorrhoids. Patient comes in today after an acute episode over this past weekend with development of severe abdominal pain and cramping which started on 04/24/2017 after she had gone to bed. She said she had just returned home from a long driving trip. She felt pain in her left abdomen and across her lower abdomen rather diffuse tenderness bloating and swelling. She says the following morning she started passing some blood and mucus whenever she would pass gas but really did not have any bowel movement. She did this several times yesterday and then again this morning. She's not had any significant diarrhea. No documented fever but did have some hot and cold feelings yesterday. She hasn't eaten much over the past 24 hours because of abdominal discomfort. No nausea or vomiting. She had not been on any new medications recently no recent antibiotics. She did take some Sudafed a couple of days ago. She is very concerned currently because she's most be leaving tomorrow to fly to [place].  Review of Systems Pertinent positive and negative review of systems were noted in the above HPI section.  All other review of systems was otherwise negative.  Outpatient Encounter Prescriptions as of 04/26/2017  Medication Sig  . Calcium Carbonate-Vit D-Min (CALTRATE PLUS PO) Take 1,200 mg by mouth daily.  Marland Kitchen esomeprazole (NEXIUM) 20 MG capsule TAKE 1-2 CAPSULES DAILY AT NOON.  . hydrocortisone cream 1 % Apply 1 application topically 2  (two) times daily.   [name] Oil 500 MG CAPS Take 1 capsule (500 mg total) by mouth daily.  . montelukast (SINGULAIR) 10 MG tablet Take 1 tablet (10 mg total) by mouth daily as needed. (Patient taking differently: Take 10 mg by mouth as needed. )  . promethazine (PHENERGAN) 25 MG suppository Place 1 suppository (25 mg total) rectally every 6 (six) hours as needed for nausea or vomiting. (Patient taking differently: Place 25 mg rectally as needed for nausea or vomiting. )  . ranitidine (ZANTAC) 150 MG tablet Take 1 tablet (150 mg total) by mouth at bedtime. (Patient taking differently: Take 150 mg by mouth as needed. )  . rosuvastatin (CRESTOR) 5 MG tablet TAKE ONE TABLET ON TUESDAY AND SATURDAY  . SYNTHROID 125 MCG tablet   . vitamin C (ASCORBIC ACID) 500 MG tablet Take 1 tablet (500 mg total) by mouth daily.  . Vitamin D, Ergocalciferol, (DRISDOL) 50000 units CAPS capsule Take 1 capsule (50,000 Units total) by mouth every 7 (seven) days. (Patient taking differently: Take 50,000 Units by mouth as needed. )  . glycopyrrolate (ROBINUL) 2 MG tablet Take 1 tablet (2 mg total) by mouth 2 (two) times daily.   No facility-administered encounter medications on file as of 04/26/2017.    Allergies  Allergen Reactions  . Celestone [Betamethasone Sodium Phosphate] Other (See Comments)    "Feels hot and looks sunburned"  . Droperidol Other (See Comments)    "feels like I have withdrawal symptoms"  . Omni-Pac [Cefdinir] Diarrhea and Other (See Comments)    Other Reaction:  Other reaction  . Statins Other (See Comments)    "joint stiffness"  . Hydrocodone Itching and Rash   Patient Active Problem List   Diagnosis Date Noted  . Anxiety state 08/24/2016  . Palpitations 03/01/2016  . Hyperglycemia 03/01/2016  . OA (osteoarthritis) of knee 12/09/2015  . External hemorrhoid, thrombosed 03/02/2014  . Thyroid cancer ([name]) 05/04/2011  . BENIGN POSITIONAL VERTIGO 07/30/2010  . Overweight 08/14/2009  .  COLONIC POLYPS, HYPERPLASTIC, HX OF 04/17/2008  . Mixed hyperlipidemia 11/24/2007  . GERD 11/24/2007  . HIATAL HERNIA 11/24/2007   Social History   Social History  . Marital status: Married    Spouse name: N/A  . Number of children: 2  . Years of education: N/A   Occupational History  . realtor    Social History Main Topics  . Smoking status: Former Smoker    Years: 5.00    Types: Cigarettes    Start date: 09/28/1984    Quit date: 08/28/1985  . Smokeless tobacco: Never Used  . Alcohol use Yes     Comment: rarely  . Drug use: No  . Sexual activity: Not on file   Other Topics Concern  . Not on file   Social History Narrative  . No narrative on file    [name]'s family history includes Allergies in her brother; Alzheimer's disease in her mother; Atrial fibrillation in her mother; Cancer in her father; Diabetes in her father; Heart disease in her daughter and father; Hyperlipidemia in her brother and mother; Hyperparathyroidism in her mother; Hypertension in her brother and mother; Stroke in her father; Thyroid disease in her maternal grandmother and mother.      Objective:    Vitals:   04/26/17 1329  BP: 100/70  Pulse: 88    Physical Exam well-developed white female in no acute distress, accompanied by her daughter blood pressure 100/70 pulse 88, height 5 foot 7, weight 171, BMI of 26.8. HEENT ;nontraumatic was phallic EOMI PERRLA sclera anicteric, Cardiovascular; regular rate and rhythm with S1-S2 no murmur or gallop, Pulmonary ;clear bilaterally, Abdomen; soft, she has a mild generalized tenderness she's bit more tender in the left mid and left lower quadrant there is no guarding or rebound no palpable mass or hepatosplenomegaly, Rectal; exam not done patient did show me a picture of what she passed this morning which looked like a small amount of bloody mucus and Extremities; no clubbing cyanosis or edema skin warm and dry, Neuropsych ;mood and affect  appropriate       Assessment & Ryan:   #69 61 year old female with 48 hour history of acute crampy abdominal pain followed by passage of blood and mucus per rectum without diarrhea or bowel movements. By history and exam I suspect she has had an episode of segmental ischemic colitis. #2 chronic GERD #3 history of Barrett's esophagus-last EGD October 2015 biopsies negative for Barrett's recommended for 5 year interval follow-up #4 history of thyroid CA  Ryan; Patient had labs done prior to office visit today WBC of 10.6, hemoglobin 13 electrolytes within normal limits. Stool for C. difficile was ordered.(Do not feel that she needs to complete this)  Long discussion with the patient today regarding natural history of segmental ischemic colitis. She is very concerned because she is leaving to go out of state. We discussed CT of the abdomen and pelvis to help make definite diagnosis and she is agreeable to this. She is encouraged to continue to push fluids, full liquid to soft bland diet  as tolerated We'll give her a trial of Robinul Forte 2 mg by mouth twice a day when necessary for abdominal cramping. Expect she will continue to improve over the next 4-5 days. Patient is also due for follow-up colonoscopy this fall. When she returns from her trip she will call back to schedule for colonoscopy with [name].  Alfredia Ferguson PA-C 04/26/2017   Cc: Mosie Lukes, MD

## 2017-04-26 NOTE — Telephone Encounter (Signed)
[name], can you please advise patient to do CBC, BMP and C.diff. Try to bring her in for office visit. Thanks

## 2017-04-30 NOTE — Progress Notes (Signed)
Reviewed and agree with documentation and assessment and plan. K. Veena Nandigam , MD   

## 2017-05-17 ENCOUNTER — Telehealth: Payer: Self-pay | Admitting: Gastroenterology

## 2017-05-17 NOTE — Telephone Encounter (Signed)
Patient's symptoms that brought her into the office have resolved. She has not seen any further rectal blood.  She wants to have her colonoscopy sooner than her November recall. She is concerned there could be something there (colon disease) even though her symptoms resolved.  Is it okay to schedule her colonoscopy before November which is the date of her actual recall?

## 2017-05-17 NOTE — Telephone Encounter (Signed)
Patient is scheduled. She is pleased with this and thanks me for my call. See the appointments for details.

## 2017-05-17 NOTE — Telephone Encounter (Signed)
Okay to schedule colonoscopy. 

## 2017-05-19 ENCOUNTER — Other Ambulatory Visit: Payer: Self-pay | Admitting: Family Medicine

## 2017-05-20 NOTE — Telephone Encounter (Signed)
Faxed Crestor/thx dmf

## 2017-05-26 DIAGNOSIS — Z85850 Personal history of malignant neoplasm of thyroid: Secondary | ICD-10-CM | POA: Diagnosis not present

## 2017-05-26 DIAGNOSIS — E890 Postprocedural hypothyroidism: Secondary | ICD-10-CM | POA: Diagnosis not present

## 2017-06-01 ENCOUNTER — Other Ambulatory Visit: Payer: Self-pay | Admitting: Gastroenterology

## 2017-06-02 ENCOUNTER — Other Ambulatory Visit: Payer: Self-pay | Admitting: Gastroenterology

## 2017-06-02 ENCOUNTER — Telehealth: Payer: Self-pay

## 2017-06-02 NOTE — Telephone Encounter (Signed)
Initiated PA for esomeprazole through cover my meds. Waiting on response.

## 2017-06-02 NOTE — Telephone Encounter (Signed)
Informed patient that we are working on her PA and will notify her when finished. See previous phone message from today.

## 2017-06-03 NOTE — Telephone Encounter (Signed)
Received fax from [facility] stating they do not handle PA's for this patient. Called phone number provided by pharmacy and initiated PA with [facility]. They faxed appropriate form and I filled out and faxed back to them Waiting on response.

## 2017-06-03 NOTE — Telephone Encounter (Signed)
Bianca from [facility] called and had more questions regarding prior authorization. Answered questions and she states the pharmacist will review the PA and they will let us know the determination by fax.

## 2017-06-07 ENCOUNTER — Other Ambulatory Visit: Payer: Self-pay

## 2017-06-07 MED ORDER — DEXLANSOPRAZOLE 30 MG PO CPDR: 30.0000 mg | DELAYED_RELEASE_CAPSULE | Freq: Every day | ORAL | 0 refills | Status: DC

## 2017-06-07 NOTE — Telephone Encounter (Signed)
Informed patient of the denial and that she has to try the preferred medications first. Patient states she will try each of them for 30 days and then we can do the PA again for esomeprazole to get it approved. Dexilant 30 mg sent to the pharmacy for one month. Patient will call back in one month for the other prescription for pantoprazole 20 mg to be sent in to the pharmacy.

## 2017-06-07 NOTE — Telephone Encounter (Signed)
Pt calling back asking to speak to [facility] about her Prior Auth

## 2017-06-07 NOTE — Telephone Encounter (Signed)
Patient states she was wondering the status of PA. Informed patient I have not heard from the insurance company but I will call to get status.   Called and spoke with [employer] and they state that patient's PA was denied because she has not tried and failed there two alternative medications: Dexilant and pantoprazole.

## 2017-06-21 ENCOUNTER — Ambulatory Visit (AMBULATORY_SURGERY_CENTER): Payer: Self-pay | Admitting: *Deleted

## 2017-06-21 VITALS — Ht 67.5 in | Wt 173.4 lb

## 2017-06-21 DIAGNOSIS — Z1211 Encounter for screening for malignant neoplasm of colon: Secondary | ICD-10-CM

## 2017-06-21 MED ORDER — NA SULFATE-K SULFATE-MG SULF 17.5-3.13-1.6 GM/177ML PO SOLN: 1.0000 [IU] | Freq: Once | ORAL | 0 refills | Status: AC

## 2017-06-21 NOTE — Progress Notes (Signed)
No egg or soy allergy known to patient  No issues with past sedation with any surgeries  or procedures, no intubation problems  No diet pills per patient No home 02 use per patient  No blood thinners per patient  Pt denies issues with constipation  No A fib or A flutter  EMMI video sent to pt's e mail  

## 2017-06-29 ENCOUNTER — Ambulatory Visit (AMBULATORY_SURGERY_CENTER): Payer: BLUE CROSS/BLUE SHIELD | Admitting: Gastroenterology

## 2017-06-29 ENCOUNTER — Other Ambulatory Visit: Payer: Self-pay | Admitting: Family Medicine

## 2017-06-29 ENCOUNTER — Encounter: Payer: Self-pay | Admitting: Gastroenterology

## 2017-06-29 VITALS — BP 138/72 | HR 67 | Temp 98.0°F | Resp 16 | Ht 67.0 in | Wt 171.0 lb

## 2017-06-29 DIAGNOSIS — Z1211 Encounter for screening for malignant neoplasm of colon: Secondary | ICD-10-CM | POA: Diagnosis not present

## 2017-06-29 DIAGNOSIS — K6289 Other specified diseases of anus and rectum: Secondary | ICD-10-CM | POA: Diagnosis not present

## 2017-06-29 MED ORDER — SODIUM CHLORIDE 0.9 % IV SOLN: 500.0000 mL | INTRAVENOUS | Status: DC

## 2017-06-29 MED ORDER — MESALAMINE 1000 MG RE SUPP: 1000.0000 mg | Freq: Every day | RECTAL | 0 refills | Status: DC

## 2017-06-29 NOTE — Patient Instructions (Addendum)
YOU HAD AN ENDOSCOPIC PROCEDURE TODAY AT [facility]:   Refer to the procedure report that was given to you for any specific questions about what was found during the examination.  If the procedure report does not answer your questions, please call your gastroenterologist to clarify.  If you requested that your care partner not be given the details of your procedure findings, then the procedure report has been included in a sealed envelope for you to review at your convenience later.  YOU SHOULD EXPECT: Some feelings of bloating in the abdomen. Passage of more gas than usual.  Walking can help get rid of the air that was put into your GI tract during the procedure and reduce the bloating. If you had a lower endoscopy (such as a colonoscopy or flexible sigmoidoscopy) you may notice spotting of blood in your stool or on the toilet paper. If you underwent a bowel prep for your procedure, you may not have a normal bowel movement for a few days.  Please Note:  You might notice some irritation and congestion in your nose or some drainage.  This is from the oxygen used during your procedure.  There is no need for concern and it should clear up in a day or so.  SYMPTOMS TO REPORT IMMEDIATELY:   Following lower endoscopy (colonoscopy or flexible sigmoidoscopy):  Excessive amounts of blood in the stool  Significant tenderness or worsening of abdominal pains  Swelling of the abdomen that is new, acute  Fever of 100F or higher  For urgent or emergent issues, a gastroenterologist can be reached at any hour by calling [phone].   DIET:  We do recommend a small meal at first, but then you may proceed to your regular diet.  Drink plenty of fluids but you should avoid alcoholic beverages for 24 hours.  ACTIVITY:  You should plan to take it easy for the rest of today and you should NOT DRIVE or use heavy machinery until tomorrow (because of the sedation medicines used during the test).     FOLLOW UP: Our staff will call the number listed on your records the next business day following your procedure to check on you and address any questions or concerns that you may have regarding the information given to you following your procedure. If we do not reach you, we will leave a message.  However, if you are feeling well and you are not experiencing any problems, there is no need to return our call.  We will assume that you have returned to your regular daily activities without incident.  If any biopsies were taken you will be contacted by phone or by letter within the next 1-3 weeks.  Please call us at [phone] if you have not heard about the biopsies in 3 weeks.   Await for biopsy results Proctits (handout given) Diverticulosis (handout given) Hemorrhoids (handout given) Canasa suppository at bedtime daily x 7-10 days The office will call will call with next available appointment Beth will order Nexium prescription  SIGNATURES/CONFIDENTIALITY: You and/or your care partner have signed paperwork which will be entered into your electronic medical record.  These signatures attest to the fact that that the information above on your After Visit Summary has been reviewed and is understood.  Full responsibility of the confidentiality of this discharge information lies with you and/or your care-partner.

## 2017-06-29 NOTE — Progress Notes (Signed)
Called to room to assist during endoscopic procedure.  Patient ID and intended procedure confirmed with present staff. Received instructions for my participation in the procedure from the performing physician.  

## 2017-06-29 NOTE — Op Note (Signed)
[facility] Patient Name: [name] Procedure Date: 06/29/2017 2:32 PM MRN: [id number] Endoscopist: Mauri Pole , MD Age: 61 Referring MD:  Date of Birth: 01-28-1956 Gender: Female Account #: [id number] Procedure:                Colonoscopy Indications:              Screening for colorectal malignant neoplasm Medicines:                Monitored Anesthesia Care Procedure:                Pre-Anesthesia Assessment:                           - Prior to the procedure, a History and Physical                            was performed, and patient medications and                            allergies were reviewed. The patient's tolerance of                            previous anesthesia was also reviewed. The risks                            and benefits of the procedure and the sedation                            options and risks were discussed with the patient.                            All questions were answered, and informed consent                            was obtained. Prior Anticoagulants: The patient has                            taken no previous anticoagulant or antiplatelet                            agents. ASA Grade Assessment: II - A patient with                            mild systemic disease. After reviewing the risks                            and benefits, the patient was deemed in                            satisfactory condition to undergo the procedure.                           After obtaining informed consent, the colonoscope  was passed under direct vision. Throughout the                            procedure, the patient's blood pressure, pulse, and                            oxygen saturations were monitored continuously. The                            Model PCF-[id number]) scope was introduced                            through the anus and advanced to the the cecum,                            identified  by appendiceal orifice and ileocecal                            valve. The colonoscopy was performed without                            difficulty. The patient tolerated the procedure                            well. The quality of the bowel preparation was                            good. The ileocecal valve, appendiceal orifice, and                            rectum were photographed. Scope In: 2:45:08 PM Scope Out: 3:07:43 PM Scope Withdrawal Time: 0 hours 13 minutes 30 seconds  Total Procedure Duration: 0 hours 22 minutes 35 seconds  Findings:                 The perianal and digital rectal examinations were                            normal.                           A few small-mouthed diverticula were found in the                            sigmoid colon and descending colon.                           Patchy areas of erythema, friable, nonbleeding                            ulcerated mucosa with stigmata of recent bleeding                            were present in the rectum. Biopsies were taken  with a cold forceps for histology.                           Non-bleeding internal hemorrhoids were found during                            retroflexion. The hemorrhoids were small. Complications:            No immediate complications. Estimated Blood Loss:     Estimated blood loss was minimal. Impression:               - Diverticulosis in the sigmoid colon and in the                            descending colon.                           - Mucosal ulceration. Biopsied.                           - Non-bleeding internal hemorrhoids. Recommendation:           - Patient has a contact number available for                            emergencies. The signs and symptoms of potential                            delayed complications were discussed with the                            patient. Return to normal activities tomorrow.                            Written  discharge instructions were provided to the                            patient.                           - Resume previous diet.                           - Continue present medications.                           - Await pathology results.                           - Repeat colonoscopy date to be determined after                            pending pathology results are reviewed for                            surveillance based on pathology results.                           -  Canasa suppository at bedtime daily X 7-10 days                           Follow up in office visit next available. Mauri Pole, MD 06/29/2017 3:15:45 PM This report has been signed electronically.

## 2017-06-29 NOTE — Progress Notes (Signed)
Spontaneous respirations throughout. VSS. Resting comfortably. To PACU on room air. Report to  RN. 

## 2017-06-30 ENCOUNTER — Telehealth: Payer: Self-pay | Admitting: *Deleted

## 2017-06-30 NOTE — Telephone Encounter (Signed)
  Follow up Call-  Call back number 06/29/2017  Post procedure Call Back phone  # [phone]  Permission to leave phone message Yes  Some recent data might be hidden     Patient questions:  Do you have a fever, pain , or abdominal swelling? No. Pain Score  0 *  Have you tolerated food without any problems? Yes.    Have you been able to return to your normal activities? Yes.    Do you have any questions about your discharge instructions: Diet   No. Medications  No. Follow up visit  No.  Do you have questions or concerns about your Care? No.  Actions: * If pain score is 4 or above: No action needed, pain <4.

## 2017-07-08 ENCOUNTER — Encounter: Payer: Self-pay | Admitting: Gastroenterology

## 2017-07-08 ENCOUNTER — Telehealth: Payer: Self-pay | Admitting: Gastroenterology

## 2017-07-08 NOTE — Telephone Encounter (Signed)
First available with you is 09/01/17. Do you want her seen by a PA sooner?

## 2017-07-08 NOTE — Telephone Encounter (Signed)
Ok, thanks.

## 2017-07-12 NOTE — Telephone Encounter (Signed)
Tried to schedule the patient with APP. She has seen Amy Esterwood, PA-C. She declines. "I want to see my doctor." I scheduled her for 09/03/17 and put her on the wait list. She denies any problems. No rectal bleeding or pain. She used Canasa suppositories for" 3 or 4 days".

## 2017-07-13 NOTE — Telephone Encounter (Signed)
Please try to bring her in sooner if I have any cancellations

## 2017-07-13 NOTE — Telephone Encounter (Signed)
Cancellation 07/15/17. Patient contacted. She is able to come to this appointment. She states she would also like to have a PA for Nexium tried again because she has tried and failed 2 preferred medications.

## 2017-07-15 ENCOUNTER — Ambulatory Visit (INDEPENDENT_AMBULATORY_CARE_PROVIDER_SITE_OTHER): Payer: BLUE CROSS/BLUE SHIELD | Admitting: Gastroenterology

## 2017-07-15 ENCOUNTER — Encounter: Payer: Self-pay | Admitting: Gastroenterology

## 2017-07-15 VITALS — BP 104/60 | HR 66 | Ht 67.0 in | Wt 173.6 lb

## 2017-07-15 DIAGNOSIS — K641 Second degree hemorrhoids: Secondary | ICD-10-CM

## 2017-07-15 DIAGNOSIS — K219 Gastro-esophageal reflux disease without esophagitis: Secondary | ICD-10-CM | POA: Diagnosis not present

## 2017-07-15 MED ORDER — ESOMEPRAZOLE MAGNESIUM 20 MG PO CPDR: 20.0000 mg | DELAYED_RELEASE_CAPSULE | Freq: Two times a day (BID) | ORAL | 11 refills | Status: DC

## 2017-07-15 MED ORDER — ESOMEPRAZOLE MAGNESIUM 20 MG PO CPDR: 20.0000 mg | DELAYED_RELEASE_CAPSULE | Freq: Every day | ORAL | 11 refills | Status: DC

## 2017-07-15 NOTE — Patient Instructions (Signed)

## 2017-07-15 NOTE — Progress Notes (Signed)
[name]    [id number]    1956-08-22  Primary Care Physician:Blyth, Bonnita Levan, MD  Referring Physician: Mosie Lukes, MD [address] [place], [place] [address]  Chief complaint:  GERD  HPI: 61 year old female with history of chronic GERD here for follow-up visit. Patient feels she had best response with Nexium. Her insurance declined coverage. She filled Rx for Dexilant but did not take it. She is currently taking over-the-counter Nexium 20 mg daily with some improvement but not complete relief of heartburn.  She has taken Aciphex, Omeprazole, Prevacid and Protonix in the past and feels they didn't work She tried weaning off PPI as she was concerned about potential side effects and has been unsuccessful due to daily heartburn and persistent GERD symptoms.   She is currently taking probiotic daily with improvement of constipation  Denies any rectal bleeding, status post hemorrhoidal band ligationX1 last year and patient wants to do further hemorrhoidal banding to remove the remaining hemorrhoids. She has sensation of swelling and protrusion     Outpatient Encounter Prescriptions as of 07/15/2017  Medication Sig  . Calcium Carbonate-Vit D-Min (CALTRATE PLUS PO) Take 1,200 mg by mouth daily.  Marland Kitchen Dexlansoprazole (DEXILANT) 30 MG capsule Take 1 capsule (30 mg total) by mouth daily.  Marland Kitchen esomeprazole (NEXIUM) 20 MG capsule Take 20 mg by mouth.  Marland Kitchen glycopyrrolate (ROBINUL) 2 MG tablet Take 1 tablet (2 mg total) by mouth 2 (two) times daily.  . hydrocortisone cream 1 % Apply 1 application topically 2 (two) times daily as needed.   [name] 500 MG CAPS Take 1 capsule (500 mg total) by mouth daily.  . montelukast (SINGULAIR) 10 MG tablet Take 1 tablet (10 mg total) by mouth daily as needed.  . promethazine (PHENERGAN) 25 MG suppository Place 1 suppository (25 mg total) rectally every 6 (six) hours as needed for nausea or vomiting.  . ranitidine (ZANTAC)  150 MG tablet Take 1 tablet (150 mg total) by mouth at bedtime.  . rosuvastatin (CRESTOR) 5 MG tablet TAKE ONE TABLET ON TUESDAY AND SATURDAY  . SYNTHROID 125 MCG tablet 112 mcg.   . vitamin C (ASCORBIC ACID) 500 MG tablet Take 1 tablet (500 mg total) by mouth daily.  . Vitamin D, Ergocalciferol, (DRISDOL) 50000 units CAPS capsule Take 1 capsule (50,000 Units total) by mouth every 7 (seven) days. (Patient taking differently: Take 50,000 Units by mouth as needed. )  . mesalamine (CANASA) 1000 MG suppository Place 1 suppository (1,000 mg total) rectally at bedtime.   No facility-administered encounter medications on file as of 07/15/2017.     Allergies as of 07/15/2017 - Review Complete 07/15/2017  Allergen Reaction Noted  . Celestone [betamethasone sodium phosphate] Other (See Comments) 10/12/2011  . Droperidol Other (See Comments) 10/12/2011  . Omni-pac [cefdinir] Diarrhea and Other (See Comments) 08/28/2014  . Statins Other (See Comments) 08/28/2014  . Hydrocodone Itching and Rash 11/22/2015    Past Medical History:  Diagnosis Date  . Acute upper respiratory infection 09/13/2016  . Allergic rhinitis   . Allergy   . Anxiety state 08/24/2016  . Barrett's esophagus without dysplasia    pt says was suspected never confirmed 9 or 10 years ago  . Cancer [facility])    thyroid cancer  . Depression   . Esophagitis   . Fundic gland polyps of stomach, benign   . GERD (gastroesophageal reflux disease)   . Hemorrhoids   .  History of hiatal hernia   . History of ovarian cyst   . History of thyroid cancer    05-01-2011  s/p  total thyroidectomy for multinodular goiter and papillary carcinoma  . Hyperglycemia 03/01/2016  . Hyperlipidemia    borderline  . Hypothyroidism, postsurgical   . Internal hemorrhoids   . Lumbar spondylosis    L4 -- S1  . Osteoporosis    osteopenia  . Overweight 08/14/2009   Qualifier: Diagnosis of  By: Niel Hummer MD, Lorinda Creed   . Palpitations 03/01/2016  . PONV  (postoperative nausea and vomiting)    nausea with pain meds  . TMJ (temporomandibular joint disorder)     Past Surgical History:  Procedure Laterality Date  . APPENDECTOMY  01-30-2008  . CARPAL TUNNEL RELEASE Right 08/30/2014   Procedure: RIGHT CARPAL TUNNEL RELEASE;  Surgeon: Linna Hoff, MD;  Location: [facility];  Service: Orthopedics;  Laterality: Right;  . COLONOSCOPY  08-08-2007  . HEMORRHOID SURGERY    . LAPAROSCOPIC RIGHT OOPHORECTOMY  1996  . LAPAROSCOPIC TUBAL LIGATION  07-21-2002   W/   Dilatation and evacuation  . LEFT SHOULDER CLOSED MANIPULATION/  ARTHROSCOPIC DEBRIDEMENT LABRUM/  SAD/ RELEASE  CA LIGAMENT/ BURSECTOMY  10-12-2011  . PARTIAL KNEE ARTHROPLASTY Right 12/09/2015   Procedure: RIGHT KNEE MEDIAL UNICOMPARTMENTAL ARTHROPLASTY ;  Surgeon: Gaynelle Arabian, MD;  Location: [facility];  Service: Orthopedics;  Laterality: Right;  . TOTAL THYROIDECTOMY  05-01-2011  . TRANSOBTURATOR SLING  01-07-2005   W/ CYSTOCELE REPAIR  . UPPER GASTROINTESTINAL ENDOSCOPY  last one 06-29-2014    Family History  Problem Relation Age of Onset  . Diabetes Father   . Cancer Father        prostate  . Stroke Father        bacterial endocarditis after prostate surgery  . Heart disease Father        aortic valvular disease, MI  . Thyroid disease Mother        hyperparathyroidism  . Alzheimer's disease Mother   . Hyperparathyroidism Mother   . Atrial fibrillation Mother   . Hypertension Mother   . Hyperlipidemia Mother   . Thyroid disease Maternal Grandmother   . Hyperlipidemia Brother   . Hypertension Brother   . Allergies Brother   . Heart disease Daughter        palpitations  . Esophageal cancer Neg Hx   . Colon cancer Neg Hx   . Colon polyps Neg Hx   . Rectal cancer Neg Hx   . Stomach cancer Neg Hx     Social History   Social History  . Marital status: Married    Spouse name: N/A  . Number of children: 2  . Years of education: N/A   Occupational  History  . realtor    Social History Main Topics  . Smoking status: Former Smoker    Years: 5.00    Types: Cigarettes    Start date: 09/28/1984    Quit date: 08/28/1985  . Smokeless tobacco: Never Used  . Alcohol use Yes     Comment: rarely  . Drug use: No  . Sexual activity: Not on file   Other Topics Concern  . Not on file   Social History Narrative  . No narrative on file      Review of systems: Review of Systems  Constitutional: Negative for fever and chills.  HENT: Negative.   Eyes: Negative for blurred vision.  Respiratory: Negative for cough, shortness of  breath and wheezing.   Cardiovascular: Negative for chest pain and palpitations.  Gastrointestinal: as per HPI Genitourinary: Negative for dysuria, urgency, frequency and hematuria.  Musculoskeletal: Negative for myalgias, back pain and joint pain.  Skin: Negative for itching and rash.  Neurological: Negative for dizziness, tremors, focal weakness, seizures and loss of consciousness.  Endo/Heme/Allergies: Positive for seasonal allergies.  Psychiatric/Behavioral: Negative for depression, suicidal ideas and hallucinations.  All other systems reviewed and are negative.   Physical Exam: Vitals:   07/15/17 1402  BP: 104/60  Pulse: 66   Body mass index is 27.19 kg/m. Gen:      No acute distress HEENT:  EOMI, sclera anicteric Neck:     No masses; no thyromegaly Lungs:    Clear to auscultation bilaterally; normal respiratory effort CV:         Regular rate and rhythm; no murmurs Abd:      + bowel sounds; soft, non-tender; no palpable masses, no distension Ext:    No edema; adequate peripheral perfusion Skin:      Warm and dry; no rash Neuro: alert and oriented x 3 Psych: normal mood and affect  Data Reviewed:  Reviewed labs, radiology imaging, old records and pertinent past GI work up   Assessment and Plan/Recommendations: 61 year old female with history of chronic GERD and symptomatic hemorrhoids GERD:  Will send the prescription for Nexium 20-40 mg daily, if not covered by insurance advised patient to try Dexilant, she already has the medication Continue antireflux measures History of Barrett's esophagus on EGD in 2008 subsequent EGD with biopsies negative for Barrett's Patient is extremely anxious and requesting repeat EGD She has recall EGD in October 2020  Colonoscopy October 2018: Patchy rectal erythema /? Erosion , negative for crypt distortion or inflammation, likely related to prep Recall in 10 years   PROCEDURE NOTE: The patient presents with symptomatic grade II hemorrhoids, requesting rubber band ligation of his/her hemorrhoidal disease.  All risks, benefits and alternative forms of therapy were described and informed consent was obtained.   The anorectum was pre-medicated with 0.125% Nitroglycerine and Recticare The decision was made to band the Right posterior internal hemorrhoid, and the Stokes was used to perform band ligation without complication.  Digital anorectal examination was then performed to assure proper positioning of the band, and to adjust the banded tissue as required.  The patient was discharged home without pain or other issues.  Dietary and behavioral recommendations were given and along with follow-up instructions.    The patient will return for  follow-up and possible additional banding as required. No complications were encountered and the patient tolerated the procedure well.    Damaris Hippo , MD [phone] Mon-Fri 8a-5p [phone] after 5p, weekends, holidays  CC: Mosie Lukes, MD

## 2017-07-16 ENCOUNTER — Encounter: Payer: Self-pay | Admitting: Gastroenterology

## 2017-07-16 NOTE — Telephone Encounter (Signed)
Error

## 2017-07-21 ENCOUNTER — Telehealth: Payer: Self-pay | Admitting: *Deleted

## 2017-07-21 NOTE — Telephone Encounter (Signed)
Called patient and informed her that we are working on her prior auth for Nexium 20 mg BID  The insurance cancelled my new request and reopened the old request that was denied from September 10 th. With the new information, it takes 7 days for clinical review and this was submitted on Oct 19 th by me...   Will call [facility] and inform them as well

## 2017-07-23 NOTE — Telephone Encounter (Signed)
Reference number: [id number]  Approved until 09/27/2038     [facility] Approval letter today

## 2017-08-02 ENCOUNTER — Other Ambulatory Visit: Payer: Self-pay | Admitting: Obstetrics and Gynecology

## 2017-08-02 DIAGNOSIS — Z1231 Encounter for screening mammogram for malignant neoplasm of breast: Secondary | ICD-10-CM

## 2017-08-04 DIAGNOSIS — J3501 Chronic tonsillitis: Secondary | ICD-10-CM | POA: Diagnosis not present

## 2017-08-04 DIAGNOSIS — R42 Dizziness and giddiness: Secondary | ICD-10-CM | POA: Diagnosis not present

## 2017-08-10 ENCOUNTER — Other Ambulatory Visit: Payer: Self-pay | Admitting: Family Medicine

## 2017-08-13 DIAGNOSIS — R42 Dizziness and giddiness: Secondary | ICD-10-CM | POA: Diagnosis not present

## 2017-08-18 DIAGNOSIS — H5213 Myopia, bilateral: Secondary | ICD-10-CM | POA: Diagnosis not present

## 2017-08-30 ENCOUNTER — Ambulatory Visit
Admission: RE | Admit: 2017-08-30 | Discharge: 2017-08-30 | Disposition: A | Discharge status: Home / Self Care | Payer: BLUE CROSS/BLUE SHIELD | Source: Ambulatory Visit | Attending: Obstetrics and Gynecology | Admitting: Obstetrics and Gynecology

## 2017-08-30 DIAGNOSIS — Z1231 Encounter for screening mammogram for malignant neoplasm of breast: Secondary | ICD-10-CM

## 2017-09-03 ENCOUNTER — Ambulatory Visit: Payer: Self-pay | Admitting: Gastroenterology

## 2017-09-27 ENCOUNTER — Other Ambulatory Visit: Payer: Self-pay | Admitting: Family Medicine

## 2017-10-04 DIAGNOSIS — Z01419 Encounter for gynecological examination (general) (routine) without abnormal findings: Secondary | ICD-10-CM | POA: Diagnosis not present

## 2017-10-04 DIAGNOSIS — M818 Other osteoporosis without current pathological fracture: Secondary | ICD-10-CM | POA: Diagnosis not present

## 2017-10-04 DIAGNOSIS — Z6828 Body mass index (BMI) 28.0-28.9, adult: Secondary | ICD-10-CM | POA: Diagnosis not present

## 2017-10-04 LAB — HM PAP SMEAR: HM Pap smear: NORMAL

## 2017-10-14 DIAGNOSIS — Z13820 Encounter for screening for osteoporosis: Secondary | ICD-10-CM | POA: Diagnosis not present

## 2017-10-14 LAB — HM DEXA SCAN

## 2017-10-22 DIAGNOSIS — M79642 Pain in left hand: Secondary | ICD-10-CM | POA: Diagnosis not present

## 2017-10-22 DIAGNOSIS — M79645 Pain in left finger(s): Secondary | ICD-10-CM | POA: Diagnosis not present

## 2017-10-22 DIAGNOSIS — S63653A Sprain of metacarpophalangeal joint of left middle finger, initial encounter: Secondary | ICD-10-CM | POA: Diagnosis not present

## 2017-11-11 DIAGNOSIS — F603 Borderline personality disorder: Secondary | ICD-10-CM | POA: Diagnosis not present

## 2017-11-18 DIAGNOSIS — M9901 Segmental and somatic dysfunction of cervical region: Secondary | ICD-10-CM | POA: Diagnosis not present

## 2017-11-18 DIAGNOSIS — M9902 Segmental and somatic dysfunction of thoracic region: Secondary | ICD-10-CM | POA: Diagnosis not present

## 2017-11-18 DIAGNOSIS — M5137 Other intervertebral disc degeneration, lumbosacral region: Secondary | ICD-10-CM | POA: Diagnosis not present

## 2017-11-18 DIAGNOSIS — F603 Borderline personality disorder: Secondary | ICD-10-CM | POA: Diagnosis not present

## 2017-11-18 DIAGNOSIS — M9903 Segmental and somatic dysfunction of lumbar region: Secondary | ICD-10-CM | POA: Diagnosis not present

## 2017-11-19 DIAGNOSIS — M79645 Pain in left finger(s): Secondary | ICD-10-CM | POA: Diagnosis not present

## 2017-11-23 DIAGNOSIS — M9902 Segmental and somatic dysfunction of thoracic region: Secondary | ICD-10-CM | POA: Diagnosis not present

## 2017-11-23 DIAGNOSIS — M5137 Other intervertebral disc degeneration, lumbosacral region: Secondary | ICD-10-CM | POA: Diagnosis not present

## 2017-11-23 DIAGNOSIS — M9901 Segmental and somatic dysfunction of cervical region: Secondary | ICD-10-CM | POA: Diagnosis not present

## 2017-11-23 DIAGNOSIS — M9903 Segmental and somatic dysfunction of lumbar region: Secondary | ICD-10-CM | POA: Diagnosis not present

## 2017-11-25 DIAGNOSIS — F603 Borderline personality disorder: Secondary | ICD-10-CM | POA: Diagnosis not present

## 2017-11-30 ENCOUNTER — Encounter: Payer: Self-pay | Admitting: Family Medicine

## 2017-11-30 ENCOUNTER — Ambulatory Visit (INDEPENDENT_AMBULATORY_CARE_PROVIDER_SITE_OTHER): Payer: BLUE CROSS/BLUE SHIELD | Admitting: Family Medicine

## 2017-11-30 VITALS — BP 129/78 | HR 77 | Temp 98.4°F | Resp 16

## 2017-11-30 DIAGNOSIS — F411 Generalized anxiety disorder: Secondary | ICD-10-CM | POA: Diagnosis not present

## 2017-11-30 DIAGNOSIS — M9903 Segmental and somatic dysfunction of lumbar region: Secondary | ICD-10-CM | POA: Diagnosis not present

## 2017-11-30 DIAGNOSIS — M9901 Segmental and somatic dysfunction of cervical region: Secondary | ICD-10-CM | POA: Diagnosis not present

## 2017-11-30 DIAGNOSIS — R739 Hyperglycemia, unspecified: Secondary | ICD-10-CM | POA: Diagnosis not present

## 2017-11-30 DIAGNOSIS — K219 Gastro-esophageal reflux disease without esophagitis: Secondary | ICD-10-CM | POA: Diagnosis not present

## 2017-11-30 DIAGNOSIS — M5137 Other intervertebral disc degeneration, lumbosacral region: Secondary | ICD-10-CM | POA: Diagnosis not present

## 2017-11-30 DIAGNOSIS — E782 Mixed hyperlipidemia: Secondary | ICD-10-CM

## 2017-11-30 DIAGNOSIS — M9902 Segmental and somatic dysfunction of thoracic region: Secondary | ICD-10-CM | POA: Diagnosis not present

## 2017-11-30 LAB — COMPREHENSIVE METABOLIC PANEL
ALT: 17 U/L (ref 0–35)
AST: 16 U/L (ref 0–37)
Albumin: 4.4 g/dL (ref 3.5–5.2)
Alkaline Phosphatase: 92 U/L (ref 39–117)
BUN: 12 mg/dL (ref 6–23)
CO2: 31 mEq/L (ref 19–32)
Calcium: 9.7 mg/dL (ref 8.4–10.5)
Chloride: 104 mEq/L (ref 96–112)
Creatinine, Ser: 0.72 mg/dL (ref 0.40–1.20)
GFR: 87.68 mL/min (ref >60.00)
Glucose, Bld: 93 mg/dL (ref 70–99)
Potassium: 4.2 mEq/L (ref 3.5–5.1)
Sodium: 141 mEq/L (ref 135–145)
Total Bilirubin: 0.4 mg/dL (ref 0.2–1.2)
Total Protein: 7.3 g/dL (ref 6.0–8.3)

## 2017-11-30 LAB — HEMOGLOBIN A1C: Hgb A1c MFr Bld: 5.9 % (ref 4.6–6.5)

## 2017-11-30 MED ORDER — ROSUVASTATIN CALCIUM 5 MG PO TABS: 5.0000 mg | ORAL_TABLET | Freq: Every day | ORAL | 1 refills | Status: DC

## 2017-11-30 MED ORDER — RANITIDINE HCL 150 MG PO TABS: 150.0000 mg | ORAL_TABLET | Freq: Every evening | ORAL | 1 refills | Status: DC | PRN

## 2017-11-30 MED ORDER — VITAMIN D (ERGOCALCIFEROL) 1.25 MG (50000 UNIT) PO CAPS: 50000.0000 [IU] | ORAL_CAPSULE | ORAL | 1 refills | Status: DC

## 2017-11-30 NOTE — Patient Instructions (Addendum)
Dr Cruzita Lederer Endocrinology [place] Dr Buddy Duty, endocrinology, [place]  Bone density shows osteopenia, which is thinner than normal but not as bad as osteoporosis. Recommend calcium intake of 1200 to 1500 mg daily, divided into roughly 3 doses. Best source is the diet and a single dairy serving is about 500 mg, a supplement of calcium citrate once or twice daily to balance diet is fine if not getting enough in diet. Also need Vitamin D 2000 IU caps, 1 cap daily if not already taking vitamin D. Also recommend weight baring exercise on hips and upper body to keep bones strong Carbohydrate Counting for Diabetes Mellitus, Adult Carbohydrate counting is a method for keeping track of how many carbohydrates you eat. Eating carbohydrates naturally increases the amount of sugar (glucose) in the blood. Counting how many carbohydrates you eat helps keep your blood glucose within normal limits, which helps you manage your diabetes (diabetes mellitus). It is important to know how many carbohydrates you can safely have in each meal. This is different for every person. A diet and nutrition specialist (registered dietitian) can help you make a meal plan and calculate how many carbohydrates you should have at each meal and snack. Carbohydrates are found in the following foods:  Grains, such as breads and cereals.  Dried beans and soy products.  Starchy vegetables, such as potatoes, peas, and corn.  Fruit and fruit juices.  Milk and yogurt.  Sweets and snack foods, such as cake, cookies, candy, chips, and soft drinks.  How do I count carbohydrates? There are two ways to count carbohydrates in food. You can use either of the methods or a combination of both. Reading "Nutrition Facts" on packaged food The "Nutrition Facts" list is included on the labels of almost all packaged foods and beverages in the [place] It includes:  The serving size.  Information about nutrients in each serving, including the grams (g) of  carbohydrate per serving.  To use the "Nutrition Facts":  Decide how many servings you will have.  Multiply the number of servings by the number of carbohydrates per serving.  The resulting number is the total amount of carbohydrates that you will be having.  Learning standard serving sizes of other foods When you eat foods containing carbohydrates that are not packaged or do not include "Nutrition Facts" on the label, you need to measure the servings in order to count the amount of carbohydrates:  Measure the foods that you will eat with a food scale or measuring cup, if needed.  Decide how many standard-size servings you will eat.  Multiply the number of servings by 15. Most carbohydrate-rich foods have about 15 g of carbohydrates per serving. ? For example, if you eat 8 oz (170 g) of strawberries, you will have eaten 2 servings and 30 g of carbohydrates (2 servings x 15 g = 30 g).  For foods that have more than one food mixed, such as soups and casseroles, you must count the carbohydrates in each food that is included.  The following list contains standard serving sizes of common carbohydrate-rich foods. Each of these servings has about 15 g of carbohydrates:   hamburger bun or  English muffin.   oz (15 mL) syrup.   oz (14 g) jelly.  1 slice of bread.  1 six-inch tortilla.  3 oz (85 g) cooked rice or pasta.  4 oz (113 g) cooked dried beans.  4 oz (113 g) starchy vegetable, such as peas, corn, or potatoes.  4 oz (113  g) hot cereal.  4 oz (113 g) mashed potatoes or  of a large baked potato.  4 oz (113 g) canned or frozen fruit.  4 oz (120 mL) fruit juice.  4-6 crackers.  6 chicken nuggets.  6 oz (170 g) unsweetened dry cereal.  6 oz (170 g) plain fat-free yogurt or yogurt sweetened with artificial sweeteners.  8 oz (240 mL) milk.  8 oz (170 g) fresh fruit or one small piece of fruit.  24 oz (680 g) popped popcorn.  Example of carbohydrate  counting Sample meal  3 oz (85 g) chicken breast.  6 oz (170 g) brown rice.  4 oz (113 g) corn.  8 oz (240 mL) milk.  8 oz (170 g) strawberries with sugar-free whipped topping. Carbohydrate calculation 1. Identify the foods that contain carbohydrates: ? Rice. ? Corn. ? Milk. ? Strawberries. 2. Calculate how many servings you have of each food: ? 2 servings rice. ? 1 serving corn. ? 1 serving milk. ? 1 serving strawberries. 3. Multiply each number of servings by 15 g: ? 2 servings rice x 15 g = 30 g. ? 1 serving corn x 15 g = 15 g. ? 1 serving milk x 15 g = 15 g. ? 1 serving strawberries x 15 g = 15 g. 4. Add together all of the amounts to find the total grams of carbohydrates eaten: ? 30 g + 15 g + 15 g + 15 g = 75 g of carbohydrates total. This information is not intended to replace advice given to you by your health care provider. Make sure you discuss any questions you have with your health care provider. Document Released: 09/14/2005 Document Revised: 04/03/2016 Document Reviewed: 02/26/2016 Elsevier Interactive Patient Education  [employer].

## 2017-11-30 NOTE — Progress Notes (Signed)
Subjective:  I acted as a Education administrator for Dr. Charlett Blake. [name], [place]  Patient ID: [name], female    DOB: 1956-01-21, 62 y.o.   MRN: [id number]  No chief complaint on file.   HPI  Patient is in today for a follow up visit. The visit was initially established as an annual but patient was late so we decided to change to a follow up and reschedule her annual. She has numerous concerns.  She is currently looking for a endocrinologist here in [place] after recently seeing endocrinology in [place].  She had been transitioning to [place] with her husband but they have since separated.  They were using [facility] for her osteoporosis but it gave her significant headaches so they stopped it.  She would like to start with another endocrinologist here in the area and is given several names.  She is having a great deal of trouble with anxiety and anhedonia.  Her children have suggested she consider the diagnosis of borderline personality disorder and she is currently working with a counselor to confirm the diagnosis and is considering starting dialectical behavioral therapy sessions if the diagnosis is confirmed.  She is also preparing to undergo sclerotherapy but has been waiting due to some increased trouble with her knee pain.  Her right knee has been hurting status post a fall but is improving.  It was significantly bruised but the bruise is resolving.  No instability or pain radiating to the feet are noted.  She needs refills on several medications and notes her reflux is manageable with current medications most days. Denies CP/palp/SOB/HA/congestion/fevers or GU c/o. Taking meds as prescribed Patient Care Team: Mosie Lukes, MD as PCP - General (Family Medicine)   Past Medical History:  Diagnosis Date  . Acute upper respiratory infection 09/13/2016  . Allergic rhinitis   . Allergy   . Anxiety state 08/24/2016  . Barrett's esophagus without dysplasia    pt says was suspected never confirmed 9  or 10 years ago  . Cancer [facility])    thyroid cancer  . Depression   . Esophagitis   . Fundic gland polyps of stomach, benign   . GERD (gastroesophageal reflux disease)   . Hemorrhoids   . History of hiatal hernia   . History of ovarian cyst   . History of thyroid cancer    05-01-2011  s/p  total thyroidectomy for multinodular goiter and papillary carcinoma  . Hyperglycemia 03/01/2016  . Hyperlipidemia    borderline  . Hypothyroidism, postsurgical   . Internal hemorrhoids   . Lumbar spondylosis    L4 -- S1  . Osteoporosis    osteopenia  . Overweight 08/14/2009   Qualifier: Diagnosis of  By: Niel Hummer MD, Lorinda Creed   . Palpitations 03/01/2016  . PONV (postoperative nausea and vomiting)    nausea with pain meds  . TMJ (temporomandibular joint disorder)     Past Surgical History:  Procedure Laterality Date  . APPENDECTOMY  01-30-2008  . CARPAL TUNNEL RELEASE Right 08/30/2014   Procedure: RIGHT CARPAL TUNNEL RELEASE;  Surgeon: Linna Hoff, MD;  Location: [facility];  Service: Orthopedics;  Laterality: Right;  . COLONOSCOPY  08-08-2007  . HEMORRHOID SURGERY    . LAPAROSCOPIC RIGHT OOPHORECTOMY  1996  . LAPAROSCOPIC TUBAL LIGATION  07-21-2002   W/   Dilatation and evacuation  . LEFT SHOULDER CLOSED MANIPULATION/  ARTHROSCOPIC DEBRIDEMENT LABRUM/  SAD/ RELEASE  CA LIGAMENT/ BURSECTOMY  10-12-2011  . PARTIAL KNEE  ARTHROPLASTY Right 12/09/2015   Procedure: RIGHT KNEE MEDIAL UNICOMPARTMENTAL ARTHROPLASTY ;  Surgeon: Gaynelle Arabian, MD;  Location: [facility];  Service: Orthopedics;  Laterality: Right;  . TOTAL THYROIDECTOMY  05-01-2011  . TRANSOBTURATOR SLING  01-07-2005   W/ CYSTOCELE REPAIR  . UPPER GASTROINTESTINAL ENDOSCOPY  last one 06-29-2014    Family History  Problem Relation Age of Onset  . Diabetes Father   . Cancer Father        prostate  . Stroke Father        bacterial endocarditis after prostate surgery  . Heart disease Father        aortic valvular  disease, MI  . Thyroid disease Mother        hyperparathyroidism  . Alzheimer's disease Mother   . Hyperparathyroidism Mother   . Atrial fibrillation Mother   . Hypertension Mother   . Hyperlipidemia Mother   . Thyroid disease Maternal Grandmother   . Hyperlipidemia Brother   . Hypertension Brother   . Allergies Brother   . Heart disease Daughter        palpitations  . Esophageal cancer Neg Hx   . Colon cancer Neg Hx   . Colon polyps Neg Hx   . Rectal cancer Neg Hx   . Stomach cancer Neg Hx     Social History   Socioeconomic History  . Marital status: Married    Spouse name: Not on file  . Number of children: 2  . Years of education: Not on file  . Highest education level: Not on file  Social Needs  . Financial resource strain: Not on file  . Food insecurity - worry: Not on file  . Food insecurity - inability: Not on file  . Transportation needs - medical: Not on file  . Transportation needs - non-medical: Not on file  Occupational History  . Occupation: realtor  Tobacco Use  . Smoking status: Former Smoker    Years: 5.00    Types: Cigarettes    Start date: 09/28/1984    Last attempt to quit: 08/28/1985    Years since quitting: 32.2  . Smokeless tobacco: Never Used  Substance and Sexual Activity  . Alcohol use: Yes    Comment: rarely  . Drug use: No  . Sexual activity: Not on file  Other Topics Concern  . Not on file  Social History Narrative  . Not on file    Outpatient Medications Prior to Visit  Medication Sig Dispense Refill  . Calcium Carbonate-Vit D-Min (CALTRATE PLUS PO) Take 1,200 mg by mouth daily.    Marland Kitchen esomeprazole (NEXIUM) 20 MG capsule Take 1 capsule (20 mg total) by mouth 2 (two) times daily before a meal. 60 capsule 11  . glycopyrrolate (ROBINUL) 2 MG tablet Take 1 tablet (2 mg total) by mouth 2 (two) times daily. 60 tablet 0  . hydrocortisone cream 1 % Apply 1 application topically 2 (two) times daily as needed.     [name] 500 MG CAPS  Take 1 capsule (500 mg total) by mouth daily.    . montelukast (SINGULAIR) 10 MG tablet Take 1 tablet (10 mg total) by mouth daily as needed. 30 tablet 1  . promethazine (PHENERGAN) 25 MG suppository Place 1 suppository (25 mg total) rectally every 6 (six) hours as needed for nausea or vomiting. 12 each 0  . vitamin C (ASCORBIC ACID) 500 MG tablet Take 1 tablet (500 mg total) by mouth daily.    . ranitidine (ZANTAC) 150  MG tablet Take 1 tablet (150 mg total) by mouth at bedtime. 30 tablet 3  . rosuvastatin (CRESTOR) 5 MG tablet TAKE ONE TABLET ON TUESDAY AND SATURDAY 10 tablet 0  . SYNTHROID 125 MCG tablet 112 mcg.   1  . Vitamin D, Ergocalciferol, (DRISDOL) 50000 units CAPS capsule Take 1 capsule (50,000 Units total) by mouth every 7 (seven) days. (Patient taking differently: Take 50,000 Units by mouth as needed. ) 12 capsule 1  . mesalamine (CANASA) 1000 MG suppository Place 1 suppository (1,000 mg total) rectally at bedtime. 10 suppository 0   No facility-administered medications prior to visit.     Allergies  Allergen Reactions  . Celestone [Betamethasone Sodium Phosphate] Other (See Comments)    "Feels hot and looks sunburned"  . Droperidol Other (See Comments)    "feels like I have withdrawal symptoms"  . Omni-Pac [Cefdinir] Diarrhea and Other (See Comments)    Other Reaction: Other reaction  . Statins Other (See Comments)    "joint stiffness"  . Hydrocodone Itching and Rash    Review of Systems  Constitutional: Negative for fever and malaise/fatigue.  HENT: Negative for congestion.   Eyes: Negative for blurred vision.  Respiratory: Negative for shortness of breath.   Cardiovascular: Negative for chest pain, palpitations and leg swelling.  Gastrointestinal: Positive for heartburn. Negative for abdominal pain, blood in stool and nausea.  Genitourinary: Negative for dysuria and frequency.  Musculoskeletal: Positive for joint pain. Negative for falls.  Skin: Negative for rash.    Neurological: Negative for dizziness, loss of consciousness and headaches.  Endo/Heme/Allergies: Negative for environmental allergies.  Psychiatric/Behavioral: Positive for depression. The patient is nervous/anxious.        Objective:    Physical Exam  Constitutional: She is oriented to person, place, and time. She appears well-developed and well-nourished. No distress.  HENT:  Head: Normocephalic and atraumatic.  Nose: Nose normal.  Eyes: Right eye exhibits no discharge. Left eye exhibits no discharge.  Neck: Normal range of motion. Neck supple.  Cardiovascular: Normal rate and regular rhythm.  No murmur heard. Pulmonary/Chest: Effort normal and breath sounds normal.  Abdominal: Soft. Bowel sounds are normal. There is no tenderness.  Musculoskeletal: She exhibits no edema.  Neurological: She is alert and oriented to person, place, and time.  Skin: Skin is warm and dry.  Psychiatric: She has a normal mood and affect.  Nursing note and vitals reviewed.   BP 129/78 (BP Location: Left Arm, Patient Position: Sitting, Cuff Size: Small)   Pulse 77   Temp 98.4 F (36.9 C) (Oral)   Resp 16   SpO2 99%  Wt Readings from Last 3 Encounters:  07/15/17 173 lb 9.6 oz (78.7 kg)  06/29/17 171 lb (77.6 kg)  06/21/17 173 lb 6.4 oz (78.7 kg)   BP Readings from Last 3 Encounters:  11/30/17 129/78  07/15/17 104/60  06/29/17 138/72      There is no immunization history on file for this patient.  Health Maintenance  Topic Date Due  . Hepatitis C Screening  Feb 07, 1956  . PNEUMOCOCCAL POLYSACCHARIDE VACCINE (1) 06/12/1959  . FOOT EXAM  06/12/1967  . OPHTHALMOLOGY EXAM  06/12/1967  . URINE MICROALBUMIN  06/12/1967  . HIV Screening  06/11/1972  . TETANUS/TDAP  06/11/1976  . INFLUENZA VACCINE  04/28/2017  . PAP SMEAR  08/28/2017  . HEMOGLOBIN A1C  06/02/2018  . MAMMOGRAM  08/31/2019  . COLONOSCOPY  06/30/2027    Lab Results  Component Value Date   WBC 10.6 (  H) 04/26/2017   HGB  13.8 04/26/2017   HCT 41.4 04/26/2017   PLT 239.0 04/26/2017   GLUCOSE 93 11/30/2017   CHOL 185 01/29/2017   TRIG 93.0 01/29/2017   HDL 58.40 01/29/2017   LDLDIRECT 212.6 08/10/2012   LDLCALC 108 (H) 01/29/2017   ALT 17 11/30/2017   AST 16 11/30/2017   NA 141 11/30/2017   K 4.2 11/30/2017   CL 104 11/30/2017   CREATININE 0.72 11/30/2017   BUN 12 11/30/2017   CO2 31 11/30/2017   TSH 0.76 08/07/2016   INR 1.10 12/03/2015   HGBA1C 5.9 11/30/2017    Lab Results  Component Value Date   TSH 0.76 08/07/2016   Lab Results  Component Value Date   WBC 10.6 (H) 04/26/2017   HGB 13.8 04/26/2017   HCT 41.4 04/26/2017   MCV 88.8 04/26/2017   PLT 239.0 04/26/2017   Lab Results  Component Value Date   NA 141 11/30/2017   K 4.2 11/30/2017   CO2 31 11/30/2017   GLUCOSE 93 11/30/2017   BUN 12 11/30/2017   CREATININE 0.72 11/30/2017   BILITOT 0.4 11/30/2017   ALKPHOS 92 11/30/2017   AST 16 11/30/2017   ALT 17 11/30/2017   PROT 7.3 11/30/2017   ALBUMIN 4.4 11/30/2017   CALCIUM 9.7 11/30/2017   ANIONGAP 8 12/11/2015   GFR 87.68 11/30/2017   Lab Results  Component Value Date   CHOL 185 01/29/2017   Lab Results  Component Value Date   HDL 58.40 01/29/2017   Lab Results  Component Value Date   LDLCALC 108 (H) 01/29/2017   Lab Results  Component Value Date   TRIG 93.0 01/29/2017   Lab Results  Component Value Date   CHOLHDL 3 01/29/2017   Lab Results  Component Value Date   HGBA1C 5.9 11/30/2017         Assessment & Plan:   Problem List Items Addressed This Visit    Mixed hyperlipidemia    Encouraged heart healthy diet, increase exercise, avoid trans fats, consider a krill oil cap daily. Refill given on Rosuvastatin      Relevant Medications   rosuvastatin (CRESTOR) 5 MG tablet   GERD    Avoid offending foods, start probiotics. Do not eat large meals in late evening and consider raising head of bed.  Is following with gastroenterology. Refill given on  Ranitidine.       Relevant Medications   ranitidine (ZANTAC) 150 MG tablet   Hyperglycemia - Primary    minimize simple carbs. Increase exercise as tolerated.       Relevant Orders   Comprehensive metabolic panel (Completed)   Hemoglobin A1c (Completed)   Anxiety state    She has recently separated from her husband and has established care with Jari Sportsman PhD's counseling practice they are working her up and considering the diagnosis of Borderline personality disorder. If this is confirmed she is considering undergoing Dialectal Behavioral Therapy (DBT).          I have discontinued Leona Carry. Mangione "[name]"'s SYNTHROID. I have also changed her ranitidine and rosuvastatin. Additionally, I am having her maintain her hydrocortisone cream, montelukast, promethazine, Krill Oil, vitamin C, Calcium Carbonate-Vit D-Min (CALTRATE PLUS PO), glycopyrrolate, mesalamine, esomeprazole, and Vitamin D (Ergocalciferol).  Meds ordered this encounter  Medications  . ranitidine (ZANTAC) 150 MG tablet    Sig: Take 1 tablet (150 mg total) by mouth at bedtime as needed for heartburn.    Dispense:  90 tablet  Refill:  1  . rosuvastatin (CRESTOR) 5 MG tablet    Sig: Take 1 tablet (5 mg total) by mouth at bedtime.    Dispense:  90 tablet    Refill:  1  . Vitamin D, Ergocalciferol, (DRISDOL) 50000 units CAPS capsule    Sig: Take 1 capsule (50,000 Units total) by mouth every 7 (seven) days.    Dispense:  12 capsule    Refill:  1    CMA served as scribe during this visit. History, Physical and Plan performed by medical provider. Documentation and orders reviewed and attested to.  Penni Homans, MD

## 2017-12-01 NOTE — Assessment & Plan Note (Addendum)
Encouraged heart healthy diet, increase exercise, avoid trans fats, consider a krill oil cap daily. Refill given on Rosuvastatin

## 2017-12-01 NOTE — Assessment & Plan Note (Addendum)
Avoid offending foods, start probiotics. Do not eat large meals in late evening and consider raising head of bed.  Is following with gastroenterology. Refill given on Ranitidine.

## 2017-12-01 NOTE — Assessment & Plan Note (Signed)
She has recently separated from her husband and has established care with Jari Sportsman PhD's counseling practice they are working her up and considering the diagnosis of Borderline personality disorder. If this is confirmed she is considering undergoing Dialectal Behavioral Therapy (DBT).

## 2017-12-01 NOTE — Assessment & Plan Note (Signed)
minimize simple carbs. Increase exercise as tolerated.  

## 2017-12-02 DIAGNOSIS — F603 Borderline personality disorder: Secondary | ICD-10-CM | POA: Diagnosis not present

## 2017-12-07 DIAGNOSIS — M5137 Other intervertebral disc degeneration, lumbosacral region: Secondary | ICD-10-CM | POA: Diagnosis not present

## 2017-12-07 DIAGNOSIS — M9901 Segmental and somatic dysfunction of cervical region: Secondary | ICD-10-CM | POA: Diagnosis not present

## 2017-12-07 DIAGNOSIS — M9903 Segmental and somatic dysfunction of lumbar region: Secondary | ICD-10-CM | POA: Diagnosis not present

## 2017-12-07 DIAGNOSIS — M9902 Segmental and somatic dysfunction of thoracic region: Secondary | ICD-10-CM | POA: Diagnosis not present

## 2017-12-08 ENCOUNTER — Telehealth: Payer: Self-pay | Admitting: *Deleted

## 2017-12-08 NOTE — Telephone Encounter (Signed)
Received Medical records from [facility]; forwarded to provider/SLS 03/13

## 2017-12-09 DIAGNOSIS — F4323 Adjustment disorder with mixed anxiety and depressed mood: Secondary | ICD-10-CM | POA: Diagnosis not present

## 2017-12-13 ENCOUNTER — Encounter: Payer: Self-pay | Admitting: Family Medicine

## 2017-12-13 DIAGNOSIS — F411 Generalized anxiety disorder: Secondary | ICD-10-CM | POA: Diagnosis not present

## 2017-12-13 DIAGNOSIS — K644 Residual hemorrhoidal skin tags: Secondary | ICD-10-CM | POA: Diagnosis not present

## 2017-12-14 DIAGNOSIS — M9903 Segmental and somatic dysfunction of lumbar region: Secondary | ICD-10-CM | POA: Diagnosis not present

## 2017-12-14 DIAGNOSIS — M9902 Segmental and somatic dysfunction of thoracic region: Secondary | ICD-10-CM | POA: Diagnosis not present

## 2017-12-14 DIAGNOSIS — M9901 Segmental and somatic dysfunction of cervical region: Secondary | ICD-10-CM | POA: Diagnosis not present

## 2017-12-14 DIAGNOSIS — M5137 Other intervertebral disc degeneration, lumbosacral region: Secondary | ICD-10-CM | POA: Diagnosis not present

## 2017-12-14 DIAGNOSIS — F4323 Adjustment disorder with mixed anxiety and depressed mood: Secondary | ICD-10-CM | POA: Diagnosis not present

## 2017-12-20 DIAGNOSIS — M9901 Segmental and somatic dysfunction of cervical region: Secondary | ICD-10-CM | POA: Diagnosis not present

## 2017-12-20 DIAGNOSIS — M9903 Segmental and somatic dysfunction of lumbar region: Secondary | ICD-10-CM | POA: Diagnosis not present

## 2017-12-20 DIAGNOSIS — M9902 Segmental and somatic dysfunction of thoracic region: Secondary | ICD-10-CM | POA: Diagnosis not present

## 2017-12-20 DIAGNOSIS — M5137 Other intervertebral disc degeneration, lumbosacral region: Secondary | ICD-10-CM | POA: Diagnosis not present

## 2017-12-21 ENCOUNTER — Ambulatory Visit (INDEPENDENT_AMBULATORY_CARE_PROVIDER_SITE_OTHER): Payer: Self-pay | Admitting: *Deleted

## 2017-12-21 DIAGNOSIS — I781 Nevus, non-neoplastic: Secondary | ICD-10-CM

## 2017-12-21 DIAGNOSIS — F4323 Adjustment disorder with mixed anxiety and depressed mood: Secondary | ICD-10-CM | POA: Diagnosis not present

## 2017-12-21 NOTE — Progress Notes (Signed)
X=.3% Sotradecol administered with a 27g butterfly.  Patient received a total of 6cc.  Treated majority of her spider vein concerns. Did do one retic over her R knee. Other retics would have required additional syringe(s). Easy access. Tol well. Follow prn.  Photos: Yes.    Compression stockings applied: Yes.

## 2017-12-22 ENCOUNTER — Encounter: Payer: Self-pay | Admitting: *Deleted

## 2017-12-23 ENCOUNTER — Encounter: Payer: Self-pay | Admitting: Family Medicine

## 2017-12-27 DIAGNOSIS — F4323 Adjustment disorder with mixed anxiety and depressed mood: Secondary | ICD-10-CM | POA: Diagnosis not present

## 2017-12-29 ENCOUNTER — Telehealth: Payer: Self-pay | Admitting: Family Medicine

## 2017-12-29 NOTE — Telephone Encounter (Signed)
Copied from [place]. Topic: Inquiry >> Dec 29, 2017  9:58 AM [name] wrote: rosuvastatin (CRESTOR) 5 MG tablet - pt states that previously she was taking this med for cholesterol 2x per week. Order was sent in for daily. Pt is curious as to why this has changed since her chol level was not checked. She is requesting a call back. Pt also asking if records were received from 2 other MDs (see mychart msg below) pt states no one responded to her mychart msg. Pt states she was supposed to be notified to come in for labs once this info was received. Please advise.   Suezanne Cheshire P "[name]" to Mosie Lukes, MD   12:03 PM  Hi Dr. Charlett Blake,  Just following up from my visit March 5. You were going to request records from [facility]is and endocrinologist Douglas Robinson/[place]. Trying to determine what blood work needs to be ordered by you based on what I have had recently. Did you receive the records, if so, what blood work will I need to get. I can go to [facility] in [place]. Thanks! - [name]

## 2017-12-30 DIAGNOSIS — M9903 Segmental and somatic dysfunction of lumbar region: Secondary | ICD-10-CM | POA: Diagnosis not present

## 2017-12-30 DIAGNOSIS — M9902 Segmental and somatic dysfunction of thoracic region: Secondary | ICD-10-CM | POA: Diagnosis not present

## 2017-12-30 DIAGNOSIS — M5137 Other intervertebral disc degeneration, lumbosacral region: Secondary | ICD-10-CM | POA: Diagnosis not present

## 2017-12-30 DIAGNOSIS — M9901 Segmental and somatic dysfunction of cervical region: Secondary | ICD-10-CM | POA: Diagnosis not present

## 2017-12-30 NOTE — Telephone Encounter (Signed)
Sent patient a mychart message.

## 2017-12-31 ENCOUNTER — Other Ambulatory Visit: Payer: Self-pay | Admitting: Family Medicine

## 2017-12-31 DIAGNOSIS — E039 Hypothyroidism, unspecified: Secondary | ICD-10-CM

## 2017-12-31 DIAGNOSIS — E785 Hyperlipidemia, unspecified: Secondary | ICD-10-CM

## 2018-01-03 DIAGNOSIS — F4323 Adjustment disorder with mixed anxiety and depressed mood: Secondary | ICD-10-CM | POA: Diagnosis not present

## 2018-01-10 ENCOUNTER — Other Ambulatory Visit (INDEPENDENT_AMBULATORY_CARE_PROVIDER_SITE_OTHER): Payer: BLUE CROSS/BLUE SHIELD

## 2018-01-10 DIAGNOSIS — F4323 Adjustment disorder with mixed anxiety and depressed mood: Secondary | ICD-10-CM | POA: Diagnosis not present

## 2018-01-10 DIAGNOSIS — E039 Hypothyroidism, unspecified: Secondary | ICD-10-CM | POA: Diagnosis not present

## 2018-01-10 DIAGNOSIS — E038 Other specified hypothyroidism: Secondary | ICD-10-CM

## 2018-01-10 DIAGNOSIS — E785 Hyperlipidemia, unspecified: Secondary | ICD-10-CM

## 2018-01-10 LAB — LIPID PANEL
Cholesterol: 194 mg/dL (ref 0–200)
HDL: 51.4 mg/dL (ref >39.00)
LDL Cholesterol: 123 mg/dL — ABNORMAL HIGH (ref 0–99)
NonHDL: 142.85
Total CHOL/HDL Ratio: 4
Triglycerides: 98 mg/dL (ref 0.0–149.0)
VLDL: 19.6 mg/dL (ref 0.0–40.0)

## 2018-01-10 LAB — COMPREHENSIVE METABOLIC PANEL
ALT: 12 U/L (ref 0–35)
AST: 12 U/L (ref 0–37)
Albumin: 4.5 g/dL (ref 3.5–5.2)
Alkaline Phosphatase: 93 U/L (ref 39–117)
BUN: 13 mg/dL (ref 6–23)
CO2: 27 mEq/L (ref 19–32)
Calcium: 8.9 mg/dL (ref 8.4–10.5)
Chloride: 104 mEq/L (ref 96–112)
Creatinine, Ser: 0.74 mg/dL (ref 0.40–1.20)
GFR: 84.92 mL/min (ref >60.00)
Glucose, Bld: 96 mg/dL (ref 70–99)
Potassium: 4.3 mEq/L (ref 3.5–5.1)
Sodium: 142 mEq/L (ref 135–145)
Total Bilirubin: 0.5 mg/dL (ref 0.2–1.2)
Total Protein: 7.1 g/dL (ref 6.0–8.3)

## 2018-01-10 LAB — T3, FREE: T3, Free: 3.4 pg/mL (ref 2.3–4.2)

## 2018-01-10 LAB — T4, FREE: Free T4: 0.87 ng/dL (ref 0.60–1.60)

## 2018-01-10 LAB — TSH: TSH: 0.58 u[IU]/mL (ref 0.35–4.50)

## 2018-01-10 NOTE — Addendum Note (Signed)
Addended by: Trenda Moots on: 7/61/9509 10:03 AM   Modules accepted: Orders

## 2018-01-10 NOTE — Addendum Note (Signed)
Addended by: Trenda Moots on: 4/53/6468 10:03 AM   Modules accepted: Orders

## 2018-01-10 NOTE — Addendum Note (Signed)
Addended by: Trenda Moots on: 6/81/2751 10:03 AM   Modules accepted: Orders

## 2018-01-10 NOTE — Addendum Note (Signed)
Addended by: Trenda Moots on: 11/23/3333 10:04 AM   Modules accepted: Orders

## 2018-01-10 NOTE — Addendum Note (Signed)
Addended by: Trenda Moots on: 0/88/1103 10:03 AM   Modules accepted: Orders

## 2018-01-10 NOTE — Addendum Note (Signed)
Addended by: Trenda Moots on: 10/25/1186 10:03 AM   Modules accepted: Orders

## 2018-01-11 ENCOUNTER — Telehealth: Payer: Self-pay | Admitting: *Deleted

## 2018-01-11 NOTE — Telephone Encounter (Signed)
Returning [name]'s earlier telephone voice message regarding questions post sclerotherapy.  [name] had left and right legs treated by Thea Silversmith RN on 12-21-2017.  [name] states that "large vein across my right shin" that was treated feels hard, firm, and tender.  Advised [name] to use warm compresses over right shin, take Ibuprofen 200-400 mg prn leg discomfort, and to elevate her legs when sitting.  No c/o leg/feet/or ankle swelling.  [name] will contact [facility] if symptoms worsen. [name] will call and follow up with her.

## 2018-01-17 DIAGNOSIS — F4323 Adjustment disorder with mixed anxiety and depressed mood: Secondary | ICD-10-CM | POA: Diagnosis not present

## 2018-01-24 DIAGNOSIS — H43812 Vitreous degeneration, left eye: Secondary | ICD-10-CM | POA: Diagnosis not present

## 2018-01-25 DIAGNOSIS — F4323 Adjustment disorder with mixed anxiety and depressed mood: Secondary | ICD-10-CM | POA: Diagnosis not present

## 2018-01-26 ENCOUNTER — Ambulatory Visit: Payer: Self-pay | Admitting: *Deleted

## 2018-01-26 DIAGNOSIS — F411 Generalized anxiety disorder: Secondary | ICD-10-CM | POA: Diagnosis not present

## 2018-01-26 DIAGNOSIS — F329 Major depressive disorder, single episode, unspecified: Secondary | ICD-10-CM | POA: Diagnosis not present

## 2018-01-31 DIAGNOSIS — H4312 Vitreous hemorrhage, left eye: Secondary | ICD-10-CM | POA: Diagnosis not present

## 2018-02-01 DIAGNOSIS — F4323 Adjustment disorder with mixed anxiety and depressed mood: Secondary | ICD-10-CM | POA: Diagnosis not present

## 2018-02-08 DIAGNOSIS — F4323 Adjustment disorder with mixed anxiety and depressed mood: Secondary | ICD-10-CM | POA: Diagnosis not present

## 2018-02-09 DIAGNOSIS — M9903 Segmental and somatic dysfunction of lumbar region: Secondary | ICD-10-CM | POA: Diagnosis not present

## 2018-02-09 DIAGNOSIS — M5137 Other intervertebral disc degeneration, lumbosacral region: Secondary | ICD-10-CM | POA: Diagnosis not present

## 2018-02-09 DIAGNOSIS — M9902 Segmental and somatic dysfunction of thoracic region: Secondary | ICD-10-CM | POA: Diagnosis not present

## 2018-02-09 DIAGNOSIS — M9901 Segmental and somatic dysfunction of cervical region: Secondary | ICD-10-CM | POA: Diagnosis not present

## 2018-02-11 ENCOUNTER — Encounter

## 2018-02-11 ENCOUNTER — Ambulatory Visit: Payer: BLUE CROSS/BLUE SHIELD | Admitting: Internal Medicine

## 2018-02-11 ENCOUNTER — Encounter: Payer: Self-pay | Admitting: Internal Medicine

## 2018-02-11 VITALS — BP 116/60 | HR 75 | Temp 97.6°F | Ht 67.0 in | Wt 180.2 lb

## 2018-02-11 DIAGNOSIS — E782 Mixed hyperlipidemia: Secondary | ICD-10-CM

## 2018-02-11 DIAGNOSIS — R7303 Prediabetes: Secondary | ICD-10-CM | POA: Diagnosis not present

## 2018-02-11 DIAGNOSIS — E890 Postprocedural hypothyroidism: Secondary | ICD-10-CM

## 2018-02-11 DIAGNOSIS — M8589 Other specified disorders of bone density and structure, multiple sites: Secondary | ICD-10-CM | POA: Diagnosis not present

## 2018-02-11 DIAGNOSIS — C73 Malignant neoplasm of thyroid gland: Secondary | ICD-10-CM | POA: Diagnosis not present

## 2018-02-11 DIAGNOSIS — M8580 Other specified disorders of bone density and structure, unspecified site: Secondary | ICD-10-CM | POA: Insufficient documentation

## 2018-02-11 MED ORDER — SYNTHROID 112 MCG PO TABS: 112.0000 ug | ORAL_TABLET | Freq: Every day | ORAL | 3 refills | Status: DC

## 2018-02-11 NOTE — Progress Notes (Addendum)
Patient ID: [name], female   DOB: [birth year], 62 y.o.   MRN: [id number]    HPI  [name] is a 62 y.o.-year-old female, referred by her PCP, Dr. Charlett Blake, for management of follicular variant of papillary thyroid cancer and postsurgical hypothyroidism. She moved to [place] recently from [place], where she was followed by endocrinology (reviewed notes by Dr. Virgina Evener). Prev. Seen by Dr. Forde Dandy and then [place] in town.  Pt. has been dx with thyroid cancer in 2012.  04/30/2017: total thyroidectomy - Dr Harlow Asa:     - Hurthle cell changes  2012: RAI Tx: 100 mci I131 02/06/2014: Thyroid ultrasound negative for metastasis per review of previous endocrinologist notes.  However, there were 2 small rounded lymph nodes in the left neck, measuring 5.1 x 2.5 mm and 3.8 x 3.6 mm.  They did not have visible hilar lines.  For her postsurgical hypothyroidism, she is on Synthroid DAW 112 mcg, taken: - fasting - with water - coffee + cream - separated by >30 min from b'fast  - + calcium later in day - no iron, multivitamins  - + PPIs - before lunch - + Zantac - later in the day  I reviewed pt's thyroid tests: Lab Results  Component Value Date   TSH 0.58 01/10/2018   TSH 0.76 08/07/2016   TSH 2.59 03/23/2016   TSH 1.15 09/03/2015   TSH 1.15 07/16/2014   FREET4 0.87 01/10/2018    No results found for: THYROGLB, THGAB  01/23/2016: Tg <0.1 (IMA), ATA <1.0, TSH 0.186 02/06/2014: Tg <0.1, ATA <1.0, TSH 0.707 09/01/2011: Tg <0.5, ATA <20, TSH 0.140  Pt denies: - feeling nodules in neck - hoarseness - choking - SOB with lying down Sometimes Krill oil get's stuck in throat. Has a h/o Es dilations x2.   Pt denies - fatigue - cold intolerance - constipation - dry skin - hair loss - depression But does have weight gain  She has + FH of thyroid disorders in: MGM (goiter); parathyroid ds. In [place]. No FH of thyroid cancer.  No h/o radiation tx to head or neck.  She also has a  history of prediabetes: Lab Results  Component Value Date   HGBA1C 5.9 11/30/2017   Father with DM2.  She has osteopenia.   - She has been on Evista but she could not tolerate it due to headaches.    [facility]): L1-L4: -2.0 RFN: -1.9 LFN: -1.2  She also has a history of hyperlipidemia: Lab Results  Component Value Date   CHOL 194 01/10/2018   HDL 51.40 01/10/2018   LDLCALC 123 (H) 01/10/2018   LDLDIRECT 212.6 08/10/2012   TRIG 98.0 01/10/2018   CHOLHDL 4 01/10/2018  She is on Crestor 5 twice a week.  We did not have enough time to address her prediabetes, hyperlipidemia, and osteopenia at length, as she was late for her appointment.  We plan to address these at next visit.  She also has a history of GERD.  ROS: Constitutional: + See HPI Eyes: no blurry vision, no xerophthalmia ENT: no sore throat, + see HPI Cardiovascular: no CP/SOB/palpitations/leg swelling Respiratory: no cough/SOB Gastrointestinal: no N/V/D/C/+ acid reflux Musculoskeletal: no muscle/joint aches Skin: no rashes, + easy bruising Neurological: no tremors/numbness/tingling/dizziness Psychiatric: no depression/anxiety  Past Medical History:  Diagnosis Date  . Acute upper respiratory infection 09/13/2016  . Allergic rhinitis   . Allergy   . Anxiety state 08/24/2016  . Barrett's esophagus without dysplasia  pt says was suspected never confirmed 9 or 10 years ago  . Cancer [facility])    thyroid cancer  . Depression   . Esophagitis   . Fundic gland polyps of stomach, benign   . GERD (gastroesophageal reflux disease)   . Hemorrhoids   . History of hiatal hernia   . History of ovarian cyst   . History of thyroid cancer    05-01-2011  s/p  total thyroidectomy for multinodular goiter and papillary carcinoma  . Hyperglycemia 03/01/2016  . Hyperlipidemia    borderline  . Hypothyroidism, postsurgical   . Internal hemorrhoids   . Lumbar spondylosis    L4 -- S1  .  Osteoporosis    osteopenia  . Overweight 08/14/2009   Qualifier: Diagnosis of  By: Niel Hummer MD, Lorinda Creed   . Palpitations 03/01/2016  . PONV (postoperative nausea and vomiting)    nausea with pain meds  . TMJ (temporomandibular joint disorder)    Past Surgical History:  Procedure Laterality Date  . APPENDECTOMY  01-30-2008  . CARPAL TUNNEL RELEASE Right 08/30/2014   Procedure: RIGHT CARPAL TUNNEL RELEASE;  Surgeon: Linna Hoff, MD;  Location: [facility];  Service: Orthopedics;  Laterality: Right;  . COLONOSCOPY  08-08-2007  . HEMORRHOID SURGERY    . LAPAROSCOPIC RIGHT OOPHORECTOMY  1996  . LAPAROSCOPIC TUBAL LIGATION  07-21-2002   W/   Dilatation and evacuation  . LEFT SHOULDER CLOSED MANIPULATION/  ARTHROSCOPIC DEBRIDEMENT LABRUM/  SAD/ RELEASE  CA LIGAMENT/ BURSECTOMY  10-12-2011  . PARTIAL KNEE ARTHROPLASTY Right 12/09/2015   Procedure: RIGHT KNEE MEDIAL UNICOMPARTMENTAL ARTHROPLASTY ;  Surgeon: Gaynelle Arabian, MD;  Location: [facility];  Service: Orthopedics;  Laterality: Right;  . TOTAL THYROIDECTOMY  05-01-2011  . TRANSOBTURATOR SLING  01-07-2005   W/ CYSTOCELE REPAIR  . UPPER GASTROINTESTINAL ENDOSCOPY  last one 06-29-2014   Social History   Socioeconomic History  . Marital status: Married    Spouse name: Not on file  . Number of children: 2  . Years of education: Not on file  . Highest education level: Not on file  Occupational History  . Occupation: Cabin crew  Social Needs  . Financial resource strain: Not on file  . Food insecurity:    Worry: Not on file    Inability: Not on file  . Transportation needs:    Medical: Not on file    Non-medical: Not on file  Tobacco Use  . Smoking status: Former Smoker    Years: 5.00    Types: Cigarettes    Start date: 09/28/1984    Last attempt to quit: 08/28/1985    Years since quitting: 32.4  . Smokeless tobacco: Never Used  Substance and Sexual Activity  . Alcohol use: Yes    Comment: rarely  . Drug use:  No  . Sexual activity: Not on file  Lifestyle  . Physical activity:    Days per week: Not on file    Minutes per session: Not on file  . Stress: Not on file  Relationships  . Social connections:    Talks on phone: Not on file    Gets together: Not on file    Attends religious service: Not on file    Active member of club or organization: Not on file    Attends meetings of clubs or organizations: Not on file    Relationship status: Not on file  . Intimate partner violence:    Fear of current or ex partner: Not on  file    Emotionally abused: Not on file    Physically abused: Not on file    Forced sexual activity: Not on file  Other Topics Concern  . Not on file  Social History Narrative  . Not on file   Current Outpatient Medications on File Prior to Visit  Medication Sig Dispense Refill  . Calcium Carbonate-Vit D-Min (CALTRATE PLUS PO) Take 1,200 mg by mouth daily.    Marland Kitchen escitalopram (LEXAPRO) 10 MG tablet Take 1 tablet by mouth daily.  2  . esomeprazole (NEXIUM) 20 MG capsule Take 1 capsule (20 mg total) by mouth 2 (two) times daily before a meal. 60 capsule 11  . hydrocortisone cream 1 % Apply 1 application topically 2 (two) times daily as needed.     [name] 500 MG CAPS Take 1 capsule (500 mg total) by mouth daily.    . montelukast (SINGULAIR) 10 MG tablet Take 1 tablet (10 mg total) by mouth daily as needed. 30 tablet 1  . promethazine (PHENERGAN) 25 MG suppository Place 1 suppository (25 mg total) rectally every 6 (six) hours as needed for nausea or vomiting. 12 each 0  . ranitidine (ZANTAC) 150 MG tablet Take 1 tablet (150 mg total) by mouth at bedtime as needed for heartburn. 90 tablet 1  . rosuvastatin (CRESTOR) 5 MG tablet Take 1 tablet (5 mg total) by mouth at bedtime. 90 tablet 1  . SYNTHROID 112 MCG tablet Take 1 tablet by mouth daily.  10  . vitamin C (ASCORBIC ACID) 500 MG tablet Take 1 tablet (500 mg total) by mouth daily.    . Vitamin D, Ergocalciferol,  (DRISDOL) 50000 units CAPS capsule Take 1 capsule (50,000 Units total) by mouth every 7 (seven) days. 12 capsule 1   No current facility-administered medications on file prior to visit.    Allergies  Allergen Reactions  . Celestone [Betamethasone Sodium Phosphate] Other (See Comments)    "Feels hot and looks sunburned"  . Droperidol Other (See Comments)    "feels like I have withdrawal symptoms"  . Omni-Pac [Cefdinir] Diarrhea and Other (See Comments)    Other Reaction: Other reaction  . Statins Other (See Comments)    "joint stiffness"  . Hydrocodone Itching and Rash   Family History  Problem Relation Age of Onset  . Diabetes Father   . Cancer Father        prostate  . Stroke Father        bacterial endocarditis after prostate surgery  . Heart disease Father        aortic valvular disease, MI  . Thyroid disease Mother        hyperparathyroidism  . Alzheimer's disease Mother   . Hyperparathyroidism Mother   . Atrial fibrillation Mother   . Hypertension Mother   . Hyperlipidemia Mother   . Thyroid disease Maternal Grandmother   . Hyperlipidemia Brother   . Hypertension Brother   . Allergies Brother   . Heart disease Daughter        palpitations  . Esophageal cancer Neg Hx   . Colon cancer Neg Hx   . Colon polyps Neg Hx   . Rectal cancer Neg Hx   . Stomach cancer Neg Hx     PE: BP 116/60 (BP Location: Right Arm, Patient Position: Sitting, Cuff Size: Normal)   Pulse 75   Temp 97.6 F (36.4 C) (Oral)   Ht 5\' 7"  (1.702 m)   Wt 180 lb 3.2 oz (81.7 kg)  SpO2 98%   BMI 28.22 kg/m  Wt Readings from Last 3 Encounters:  02/11/18 180 lb 3.2 oz (81.7 kg)  07/15/17 173 lb 9.6 oz (78.7 kg)  06/29/17 171 lb (77.6 kg)   Constitutional: overweight, in NAD Eyes: PERRLA, EOMI, no exophthalmos ENT: moist mucous membranes, no thyroid masses palpated,  no cervical lymphadenopathy Cardiovascular: RRR, No MRG Respiratory: CTA B Gastrointestinal: abdomen soft, NT, ND,  BS+ Musculoskeletal: no deformities, strength intact in all 4 Skin: moist, warm, no rashes Neurological: no tremor with outstretched hands, DTR normal in all 4  ASSESSMENT: 1. Thyroid cancer - see HPI  2. Postsurgical Hypothyroidism  3. Prediabetes  4. HL  5. Osteopenia  PLAN:  1. Follicular variant of papillary thyroid cancer  - She does not appear to have neck masses, enlarged cervical lymph nodes, or neck compression symptoms - I had a long discussion with the patient about her diagnosis of thyroid cancer. We reviewed together the pathology >> she is stage 1TNM  - I reassured her that papillary thyroid cancer is a slow growing cancer with good prognosis; her life expectancy is unlikely to be reduced due to the cancer. Moreover, this was follicular variant and it was encapsulated, subcentimeter, w/o lymphovascular invasion>> even better prognosis.  She had capsular invasion, though, but this is still qualifies her as low per the newest thyroid cancer guidelines - reviewed the records from [place] >> she had radioactive iodine for post-op thyroid remnant ablation. I explained that the main role of this is to facilitate monitoring in the long run (by checking thyroglobulin). Reviewed prev. Tg + ATA >> undetectable. - we will check a thyroid U/S for her now, but no imaging studies are needed from now on unless she develops neck compression sxs, she has a palpated mass in neck or her Tg starts to rise. - we will check a thyroglobulin and Tg antibodies today - I will see the patient back in 6 mo  2. Patient with h/o total thyroidectomy for cancer, now with iatrogenic hypothyroidism, on levothyroxine therapy.  - She appears euthyroid.  - We discussed about correct intake of levothyroxine, fasting, with water, separated by at least 30 minutes from breakfast, and separated by more than 4 hours from calcium, iron, multivitamins, acid reflux medications (PPIs).  I advised her to move the  coffee with creamer per the report from the Synthroid, at least 30 minutes.  However, if she switches to black coffee, she can take the Synthroid with coffee. - will check thyroid tests at next visit TSH, free T4 - I explained that her target TSH: normal range, not lower.  Explained possible side effects of over replacement with Synthroid  3. Prediabetes - Reviewed together her latest HbA1c, which is only slightly higher than normal - Discussed the importance of diet, recommended low-fat diet  4. HL - Reviewed latest lipid panel from 12/2017: LDL greatly decreased, but still above target - She continues Crestor 5 mg 2x a week without side effects  5. Osteopenia - Reviewed her DXA scan results, but did not address this in detail with her today due to lack of time - I did advise her to switch from calcium carbonate to calcium citrate supplement as this is better absorbed in the setting of PPI use  Orders Placed This Encounter  Procedures  . US THYROID  . Thyroglobulin antibody  . Thyroglobulin Level   U/S still pending.  Office Visit on 02/11/2018  Component Date Value Ref Range Status  .  Thyroglobulin Ab 02/11/2018 <1  < or = 1 IU/mL Final  . Thyroglobulin 02/11/2018 <0.1* ng/mL Final   Comment:       Reference Range:       Intact Thyroid   2.8-40.9       Athyrotic        <0.1 .       Note: Abnormal flagging is based       on the reference interval for        patients with intact thyroid. . . This test was performed using the Beck[employer] Coulter  chemiluminescent method. Values obtained from different assay methods cannot be used interchangeably. Thyroglobulin levels, regardless of value, should not be interpreted as absolute evidence of the presence or absence of disease. .   . Comment 02/11/2018    Final   Comment: . Thyroglobulin antibodies (TGAB) interfere with thyroglobulin (TG) assays; therefore, TGAB assay should always be performed in conjunction with a TG  assay. .    Excellent Tg + ATA!  Narrative    CLINICAL DATA: Other. History of thyroid cancer post total thyroidectomy and subsequent radioactive I 131 therapy in 2012.  EXAM: THYROID ULTRASOUND  TECHNIQUE: Ultrasound examination of the thyroid gland and adjacent soft tissues was performed.  COMPARISON: Thyrogen stimulated I 131 whole body nuclear medicine scan - 06/10/2012  FINDINGS: Isthmus: Surgically absent. There is no residual nodular soft tissue within the isthmic resection bed.  Right lobe: Surgically absent. There is no residual nodular soft tissue within the right lobectomy resection bed  Left lobe: Surgically absent. There is no residual nodular soft tissue within the left lobectomy resection bed  _________________________________________________________  No regional cervical lymphadenopathy.  IMPRESSION: Post total thyroidectomy without evidence of residual or locally recurrent disease.   Electronically Signed By: Sandi Mariscal M.D. On: 02/26/2018 09:01     Philemon Kingdom, MD PhD [facility] Endocrinology

## 2018-02-11 NOTE — Patient Instructions (Addendum)
Please continue Synthroid 112 mcg daily.  Take the thyroid hormone every day, with water, at least 30 minutes before breakfast, separated by at least 4 hours from: - acid reflux medications - calcium - iron - multivitamins  Please stop at the lab.  We will schedule a neck U/S for you.  Switch to calcium citrate.  Please come back for a follow-up appointment in 6 months.

## 2018-02-14 LAB — THYROGLOBULIN ANTIBODY: Thyroglobulin Ab: <1 IU/mL (ref <=1)

## 2018-02-14 LAB — THYROGLOBULIN LEVEL: Thyroglobulin: <0.1 ng/mL — ABNORMAL LOW

## 2018-02-15 DIAGNOSIS — F4323 Adjustment disorder with mixed anxiety and depressed mood: Secondary | ICD-10-CM | POA: Diagnosis not present

## 2018-02-22 DIAGNOSIS — F4323 Adjustment disorder with mixed anxiety and depressed mood: Secondary | ICD-10-CM | POA: Diagnosis not present

## 2018-02-25 ENCOUNTER — Ambulatory Visit
Admission: RE | Admit: 2018-02-25 | Discharge: 2018-02-25 | Disposition: A | Discharge status: Home / Self Care | Payer: BLUE CROSS/BLUE SHIELD | Source: Ambulatory Visit | Attending: Internal Medicine | Admitting: Internal Medicine

## 2018-02-25 DIAGNOSIS — E049 Nontoxic goiter, unspecified: Secondary | ICD-10-CM | POA: Diagnosis not present

## 2018-02-28 DIAGNOSIS — M9901 Segmental and somatic dysfunction of cervical region: Secondary | ICD-10-CM | POA: Diagnosis not present

## 2018-02-28 DIAGNOSIS — M9902 Segmental and somatic dysfunction of thoracic region: Secondary | ICD-10-CM | POA: Diagnosis not present

## 2018-02-28 DIAGNOSIS — H43813 Vitreous degeneration, bilateral: Secondary | ICD-10-CM | POA: Diagnosis not present

## 2018-02-28 DIAGNOSIS — M5137 Other intervertebral disc degeneration, lumbosacral region: Secondary | ICD-10-CM | POA: Diagnosis not present

## 2018-02-28 DIAGNOSIS — M9903 Segmental and somatic dysfunction of lumbar region: Secondary | ICD-10-CM | POA: Diagnosis not present

## 2018-03-01 DIAGNOSIS — F4323 Adjustment disorder with mixed anxiety and depressed mood: Secondary | ICD-10-CM | POA: Diagnosis not present

## 2018-03-08 DIAGNOSIS — F4323 Adjustment disorder with mixed anxiety and depressed mood: Secondary | ICD-10-CM | POA: Diagnosis not present

## 2018-03-16 ENCOUNTER — Other Ambulatory Visit: Payer: Self-pay | Admitting: Gastroenterology

## 2018-03-17 DIAGNOSIS — F4323 Adjustment disorder with mixed anxiety and depressed mood: Secondary | ICD-10-CM | POA: Diagnosis not present

## 2018-03-21 DIAGNOSIS — M5137 Other intervertebral disc degeneration, lumbosacral region: Secondary | ICD-10-CM | POA: Diagnosis not present

## 2018-03-21 DIAGNOSIS — M9902 Segmental and somatic dysfunction of thoracic region: Secondary | ICD-10-CM | POA: Diagnosis not present

## 2018-03-21 DIAGNOSIS — M9901 Segmental and somatic dysfunction of cervical region: Secondary | ICD-10-CM | POA: Diagnosis not present

## 2018-03-21 DIAGNOSIS — M9903 Segmental and somatic dysfunction of lumbar region: Secondary | ICD-10-CM | POA: Diagnosis not present

## 2018-04-05 ENCOUNTER — Encounter

## 2018-04-05 ENCOUNTER — Ambulatory Visit: Payer: BLUE CROSS/BLUE SHIELD | Admitting: Family Medicine

## 2018-04-05 ENCOUNTER — Encounter: Payer: Self-pay | Admitting: Family Medicine

## 2018-04-05 VITALS — BP 112/76 | HR 68 | Temp 97.8°F | Resp 18 | Ht 67.0 in | Wt 176.8 lb

## 2018-04-05 DIAGNOSIS — R739 Hyperglycemia, unspecified: Secondary | ICD-10-CM | POA: Diagnosis not present

## 2018-04-05 DIAGNOSIS — E663 Overweight: Secondary | ICD-10-CM

## 2018-04-05 DIAGNOSIS — C73 Malignant neoplasm of thyroid gland: Secondary | ICD-10-CM

## 2018-04-05 DIAGNOSIS — Z7289 Other problems related to lifestyle: Secondary | ICD-10-CM

## 2018-04-05 DIAGNOSIS — M8589 Other specified disorders of bone density and structure, multiple sites: Secondary | ICD-10-CM | POA: Diagnosis not present

## 2018-04-05 DIAGNOSIS — E890 Postprocedural hypothyroidism: Secondary | ICD-10-CM

## 2018-04-05 DIAGNOSIS — Z0000 Encounter for general adult medical examination without abnormal findings: Secondary | ICD-10-CM

## 2018-04-05 DIAGNOSIS — E559 Vitamin D deficiency, unspecified: Secondary | ICD-10-CM | POA: Diagnosis not present

## 2018-04-05 DIAGNOSIS — E782 Mixed hyperlipidemia: Secondary | ICD-10-CM

## 2018-04-05 DIAGNOSIS — F411 Generalized anxiety disorder: Secondary | ICD-10-CM

## 2018-04-05 DIAGNOSIS — Z1159 Encounter for screening for other viral diseases: Secondary | ICD-10-CM | POA: Diagnosis not present

## 2018-04-05 DIAGNOSIS — F4323 Adjustment disorder with mixed anxiety and depressed mood: Secondary | ICD-10-CM | POA: Diagnosis not present

## 2018-04-05 LAB — COMPREHENSIVE METABOLIC PANEL
ALT: 12 U/L (ref 0–35)
AST: 15 U/L (ref 0–37)
Albumin: 4.5 g/dL (ref 3.5–5.2)
Alkaline Phosphatase: 83 U/L (ref 39–117)
BUN: 12 mg/dL (ref 6–23)
CO2: 30 mEq/L (ref 19–32)
Calcium: 8.9 mg/dL (ref 8.4–10.5)
Chloride: 105 mEq/L (ref 96–112)
Creatinine, Ser: 0.65 mg/dL (ref 0.40–1.20)
GFR: 98.55 mL/min (ref >60.00)
Glucose, Bld: 95 mg/dL (ref 70–99)
Potassium: 4.2 mEq/L (ref 3.5–5.1)
Sodium: 143 mEq/L (ref 135–145)
Total Bilirubin: 0.5 mg/dL (ref 0.2–1.2)
Total Protein: 6.7 g/dL (ref 6.0–8.3)

## 2018-04-05 LAB — CBC
HCT: 39.9 % (ref 36.0–46.0)
Hemoglobin: 13.5 g/dL (ref 12.0–15.0)
MCHC: 33.8 g/dL (ref 30.0–36.0)
MCV: 89.6 fl (ref 78.0–100.0)
Platelets: 230 10*3/uL (ref 150.0–400.0)
RBC: 4.46 Mil/uL (ref 3.87–5.11)
RDW: 13.6 % (ref 11.5–15.5)
WBC: 4.6 10*3/uL (ref 4.0–10.5)

## 2018-04-05 LAB — VITAMIN D 25 HYDROXY (VIT D DEFICIENCY, FRACTURES): VITD: 34.66 ng/mL (ref 30.00–100.00)

## 2018-04-05 LAB — LIPID PANEL
Cholesterol: 191 mg/dL (ref 0–200)
HDL: 54.6 mg/dL (ref >39.00)
LDL Cholesterol: 117 mg/dL — ABNORMAL HIGH (ref 0–99)
NonHDL: 136.82
Total CHOL/HDL Ratio: 4
Triglycerides: 97 mg/dL (ref 0.0–149.0)
VLDL: 19.4 mg/dL (ref 0.0–40.0)

## 2018-04-05 LAB — HEMOGLOBIN A1C: Hgb A1c MFr Bld: 6 % (ref 4.6–6.5)

## 2018-04-05 MED ORDER — ROSUVASTATIN CALCIUM 5 MG PO TABS: 5.0000 mg | ORAL_TABLET | Freq: Every day | ORAL | 1 refills | Status: DC

## 2018-04-05 MED ORDER — MONTELUKAST SODIUM 10 MG PO TABS: 10.0000 mg | ORAL_TABLET | Freq: Every day | ORAL | 1 refills | Status: DC | PRN

## 2018-04-05 NOTE — Assessment & Plan Note (Signed)
lexapro is helping her feel more calm with the addition. Is following with a counselor and a support group in [place] and it is helpful.

## 2018-04-05 NOTE — Progress Notes (Signed)
Subjective:  I acted as a Education administrator for Dr. Charlett Blake. [name], [place]  Patient ID: [name], female    DOB: 07-07-1956, 62 y.o.   MRN: [id number]  No chief complaint on file.   HPI  Patient is in today for an annual exam. She is following up on her hypothyroidism, hyperlipidemia, and other medical concerns. She has no acute concerns. No recent febrile illness or acute hospitalizations. Denies CP/palp/SOB/HA/congestion/fevers/GI or GU c/o. Taking meds as prescribed. Her mother passed away in 11-13-22 from end stage alzheimer's dementia. The patient was her caregiver for 13 years. Patient is doing well with activities of daily living. Tries to maintain a heart healthy diet and stay active. No polyuria or polydipsia. Lexapro has been helpful to manage her anxiety. Has used Hyoscyamine for abdominal cramps infrequently with results.    Patient Care Team: Mosie Lukes, MD as PCP - General (Family Medicine)   Past Medical History:  Diagnosis Date  . Acute upper respiratory infection 09/13/2016  . Allergic rhinitis   . Allergy   . Anxiety state 08/24/2016  . Barrett's esophagus without dysplasia    pt says was suspected never confirmed 9 or 10 years ago  . [facility])    thyroid cancer  . Depression   . Esophagitis   . Fundic gland polyps of stomach, benign   . GERD (gastroesophageal reflux disease)   . Hemorrhoids   . History of hiatal hernia   . History of ovarian cyst   . History of thyroid cancer    05-01-2011  s/p  total thyroidectomy for multinodular goiter and papillary carcinoma  . Hyperglycemia 03/01/2016  . Hyperlipidemia    borderline  . Hypothyroidism, postsurgical   . Internal hemorrhoids   . Lumbar spondylosis    L4 -- S1  . Osteoporosis    osteopenia  . Overweight 08/14/2009   Qualifier: Diagnosis of  By: Niel Hummer MD, Lorinda Creed   . Palpitations 03/01/2016  . PONV (postoperative nausea and vomiting)    nausea with pain meds  . TMJ (temporomandibular joint  disorder)     Past Surgical History:  Procedure Laterality Date  . APPENDECTOMY  01-30-2008  . CARPAL TUNNEL RELEASE Right 08/30/2014   Procedure: RIGHT CARPAL TUNNEL RELEASE;  Surgeon: Linna Hoff, MD;  Location: [facility];  Service: Orthopedics;  Laterality: Right;  . COLONOSCOPY  08-08-2007  . HEMORRHOID SURGERY    . LAPAROSCOPIC RIGHT OOPHORECTOMY  1996  . LAPAROSCOPIC TUBAL LIGATION  07-21-2002   W/   Dilatation and evacuation  . LEFT SHOULDER CLOSED MANIPULATION/  ARTHROSCOPIC DEBRIDEMENT LABRUM/  SAD/ RELEASE  CA LIGAMENT/ BURSECTOMY  10-12-2011  . PARTIAL KNEE ARTHROPLASTY Right 12/09/2015   Procedure: RIGHT KNEE MEDIAL UNICOMPARTMENTAL ARTHROPLASTY ;  Surgeon: Gaynelle Arabian, MD;  Location: [facility];  Service: Orthopedics;  Laterality: Right;  . TOTAL THYROIDECTOMY  05-01-2011  . TRANSOBTURATOR SLING  01-07-2005   W/ CYSTOCELE REPAIR  . UPPER GASTROINTESTINAL ENDOSCOPY  last one 06-29-2014    Family History  Problem Relation Age of Onset  . Diabetes Father   . Cancer Father        prostate  . Stroke Father        bacterial endocarditis after prostate surgery  . Heart disease Father        aortic valvular disease, MI  . Thyroid disease Mother        hyperparathyroidism  . Alzheimer's disease Mother   . Hyperparathyroidism Mother   .  Atrial fibrillation Mother   . Hypertension Mother   . Hyperlipidemia Mother   . Thyroid disease Maternal Grandmother   . Hyperlipidemia Brother   . Hypertension Brother   . Allergies Brother   . Heart disease Daughter        palpitations  . Esophageal cancer Neg Hx   . Colon cancer Neg Hx   . Colon polyps Neg Hx   . Rectal cancer Neg Hx   . Stomach cancer Neg Hx     Social History   Socioeconomic History  . Marital status: Married    Spouse name: Not on file  . Number of children: 2  . Years of education: Not on file  . Highest education level: Not on file  Occupational History  . Occupation: Cabin crew    Social Needs  . Financial resource strain: Not on file  . Food insecurity:    Worry: Not on file    Inability: Not on file  . Transportation needs:    Medical: Not on file    Non-medical: Not on file  Tobacco Use  . Smoking status: Former Smoker    Years: 5.00    Types: Cigarettes    Start date: 09/28/1984    Last attempt to quit: 08/28/1985    Years since quitting: 32.6  . Smokeless tobacco: Never Used  Substance and Sexual Activity  . Alcohol use: Yes    Comment: rarely  . Drug use: No  . Sexual activity: Not on file  Lifestyle  . Physical activity:    Days per week: Not on file    Minutes per session: Not on file  . Stress: Not on file  Relationships  . Social connections:    Talks on phone: Not on file    Gets together: Not on file    Attends religious service: Not on file    Active member of club or organization: Not on file    Attends meetings of clubs or organizations: Not on file    Relationship status: Not on file  . Intimate partner violence:    Fear of current or ex partner: Not on file    Emotionally abused: Not on file    Physically abused: Not on file    Forced sexual activity: Not on file  Other Topics Concern  . Not on file  Social History Narrative  . Not on file    Outpatient Medications Prior to Visit  Medication Sig Dispense Refill  . Calcium Carbonate-Vit D-Min (CALTRATE PLUS PO) Take 1,200 mg by mouth daily.    Marland Kitchen escitalopram (LEXAPRO) 10 MG tablet Take 1 tablet by mouth daily.  2  . esomeprazole (NEXIUM) 20 MG capsule TAKE 1-2 CAPSULES DAILY AT NOON. 60 capsule 0  . hydrocortisone cream 1 % Apply 1 application topically 2 (two) times daily as needed.     [name] 500 MG CAPS Take 1 capsule (500 mg total) by mouth daily.    . promethazine (PHENERGAN) 25 MG suppository Place 1 suppository (25 mg total) rectally every 6 (six) hours as needed for nausea or vomiting. 12 each 0  . ranitidine (ZANTAC) 150 MG tablet Take 1 tablet (150 mg total) by  mouth at bedtime as needed for heartburn. 90 tablet 1  . SYNTHROID 112 MCG tablet Take 1 tablet (112 mcg total) by mouth daily. 90 tablet 3  . Vitamin D, Ergocalciferol, (DRISDOL) 50000 units CAPS capsule Take 1 capsule (50,000 Units total) by mouth every 7 (seven) days.  12 capsule 1  . montelukast (SINGULAIR) 10 MG tablet Take 1 tablet (10 mg total) by mouth daily as needed. 30 tablet 1  . rosuvastatin (CRESTOR) 5 MG tablet Take 1 tablet (5 mg total) by mouth at bedtime. 90 tablet 1   No facility-administered medications prior to visit.     Allergies  Allergen Reactions  . Celestone [Betamethasone Sodium Phosphate] Other (See Comments)    "Feels hot and looks sunburned"  . Droperidol Other (See Comments)    "feels like I have withdrawal symptoms"  . Omni-Pac [Cefdinir] Diarrhea and Other (See Comments)    Other Reaction: Other reaction  . Statins Other (See Comments)    "joint stiffness"  . Hydrocodone Itching and Rash    Review of Systems  Constitutional: Negative for fever and malaise/fatigue.  HENT: Negative for congestion.   Eyes: Negative for blurred vision.  Respiratory: Negative for cough and shortness of breath.   Cardiovascular: Negative for chest pain, palpitations and leg swelling.  Gastrointestinal: Negative for vomiting.  Musculoskeletal: Negative for back pain.  Skin: Negative for rash.  Neurological: Negative for loss of consciousness and headaches.  Psychiatric/Behavioral: Negative for depression and suicidal ideas. The patient is nervous/anxious.        Objective:    Physical Exam  Constitutional: She is oriented to person, place, and time. She appears well-developed and well-nourished. No distress.  HENT:  Head: Normocephalic and atraumatic.  Eyes: Conjunctivae are normal.  Neck: Normal range of motion. No thyromegaly present.  Cardiovascular: Normal rate and regular rhythm.  Pulmonary/Chest: Effort normal and breath sounds normal. She has no wheezes.    Abdominal: Soft. Bowel sounds are normal. There is no tenderness.  Musculoskeletal: Normal range of motion. She exhibits no edema or deformity.  Neurological: She is alert and oriented to person, place, and time.  Skin: Skin is warm and dry. She is not diaphoretic.  Psychiatric: She has a normal mood and affect.    BP 112/76 (BP Location: Left Arm, Patient Position: Sitting, Cuff Size: Normal)   Pulse 68   Temp 97.8 F (36.6 C) (Oral)   Resp 18   Ht 5\' 7"  (1.702 m)   Wt 176 lb 12.8 oz (80.2 kg)   SpO2 98%   BMI 27.69 kg/m  Wt Readings from Last 3 Encounters:  04/05/18 176 lb 12.8 oz (80.2 kg)  02/11/18 180 lb 3.2 oz (81.7 kg)  07/15/17 173 lb 9.6 oz (78.7 kg)   BP Readings from Last 3 Encounters:  04/05/18 112/76  02/11/18 116/60  11/30/17 129/78      There is no immunization history on file for this patient.  Health Maintenance  Topic Date Due  . PNEUMOCOCCAL POLYSACCHARIDE VACCINE (1) 06/12/1959  . FOOT EXAM  06/12/1967  . OPHTHALMOLOGY EXAM  06/12/1967  . URINE MICROALBUMIN  06/12/1967  . HIV Screening  06/11/1972  . TETANUS/TDAP  06/11/1976  . INFLUENZA VACCINE  04/28/2018  . HEMOGLOBIN A1C  10/06/2018  . MAMMOGRAM  08/31/2019  . PAP SMEAR  10/04/2020  . COLONOSCOPY  06/30/2027  . Hepatitis C Screening  Completed    Lab Results  Component Value Date   WBC 4.6 04/05/2018   HGB 13.5 04/05/2018   HCT 39.9 04/05/2018   PLT 230.0 04/05/2018   GLUCOSE 95 04/05/2018   CHOL 191 04/05/2018   TRIG 97.0 04/05/2018   HDL 54.60 04/05/2018   LDLDIRECT 212.6 08/10/2012   LDLCALC 117 (H) 04/05/2018   ALT 12 04/05/2018   AST 15  04/05/2018   NA 143 04/05/2018   K 4.2 04/05/2018   CL 105 04/05/2018   CREATININE 0.65 04/05/2018   BUN 12 04/05/2018   CO2 30 04/05/2018   TSH 0.58 01/10/2018   INR 1.10 12/03/2015   HGBA1C 6.0 04/05/2018    Lab Results  Component Value Date   TSH 0.58 01/10/2018   Lab Results  Component Value Date   WBC 4.6 04/05/2018   HGB  13.5 04/05/2018   HCT 39.9 04/05/2018   MCV 89.6 04/05/2018   PLT 230.0 04/05/2018   Lab Results  Component Value Date   NA 143 04/05/2018   K 4.2 04/05/2018   CO2 30 04/05/2018   GLUCOSE 95 04/05/2018   BUN 12 04/05/2018   CREATININE 0.65 04/05/2018   BILITOT 0.5 04/05/2018   ALKPHOS 83 04/05/2018   AST 15 04/05/2018   ALT 12 04/05/2018   PROT 6.7 04/05/2018   ALBUMIN 4.5 04/05/2018   CALCIUM 8.9 04/05/2018   ANIONGAP 8 12/11/2015   GFR 98.55 04/05/2018   Lab Results  Component Value Date   CHOL 191 04/05/2018   Lab Results  Component Value Date   HDL 54.60 04/05/2018   Lab Results  Component Value Date   LDLCALC 117 (H) 04/05/2018   Lab Results  Component Value Date   TRIG 97.0 04/05/2018   Lab Results  Component Value Date   CHOLHDL 4 04/05/2018   Lab Results  Component Value Date   HGBA1C 6.0 04/05/2018         Assessment & Plan:   Problem List Items Addressed This Visit    Postsurgical hypothyroidism (Chronic)    On Levothyroxine, continue to monitor with endocrinology      Osteopenia (Chronic)    Encouraged to get adequate exercise, calcium and vitamin d intake      Relevant Orders   CBC (Completed)   Mixed hyperlipidemia    Encouraged heart healthy diet, increase exercise, avoid trans fats, consider a krill oil cap daily, taking crestor Tuesday and Saturday and tolerating.       Relevant Medications   rosuvastatin (CRESTOR) 5 MG tablet   Other Relevant Orders   Lipid panel (Completed)   Overweight    Encouraged DASH diet, decrease po intake and increase exercise as tolerated. Needs 7-8 hours of sleep nightly. Avoid trans fats, eat small, frequent meals every 4-5 hours with lean proteins, complex carbs and healthy fats. Minimize simple carbs      RESOLVED: Thyroid cancer ([place])    Is now hypothyroid and doing well clinically. She is now following with Dr Cruzita Lederer of [facility] endocrinology and will follow up there in 6 months      Relevant  Orders   CBC (Completed)   Hyperglycemia    hgba1c acceptable, minimize simple carbs. Increase exercise as tolerated      Relevant Orders   Hemoglobin A1c (Completed)   Anxiety state    lexapro is helping her feel more calm with the addition. Is following with a counselor and a support group in [place] and it is helpful.      Vitamin D deficiency    Check level today, supplement      Relevant Orders   Comprehensive metabolic panel (Completed)   VITAMIN D 25 Hydroxy (Vit-D Deficiency, Fractures) (Completed)   Preventative health care    Patient encouraged to maintain heart healthy diet, regular exercise, adequate sleep. Consider daily probiotics. Take medications as prescribed. Labs ordered and reviewed. Mammogram 08/29/2017. Colonoscopy  06/29/2017 repeat in 10 years. Given and reviewed copy of ACP documents from [place] and encouraged to complete and return       Other Visit Diagnoses    Other problems related to lifestyle    -  Primary   Relevant Orders   Hepatitis C antibody (Completed)      I am having Yicel P. [place]etta "[name]" maintain her hydrocortisone cream, promethazine, Krill Oil, Calcium Carbonate-Vit D-Min (CALTRATE PLUS PO), ranitidine, Vitamin D (Ergocalciferol), escitalopram, SYNTHROID, esomeprazole, rosuvastatin, and montelukast.  Meds ordered this encounter  Medications  . rosuvastatin (CRESTOR) 5 MG tablet    Sig: Take 1 tablet (5 mg total) by mouth at bedtime.    Dispense:  90 tablet    Refill:  1  . montelukast (SINGULAIR) 10 MG tablet    Sig: Take 1 tablet (10 mg total) by mouth daily as needed.    Dispense:  90 tablet    Refill:  1    CMA served as Education administrator during this visit. History, Physical and Plan performed by medical provider. Documentation and orders reviewed and attested to.  Penni Homans, MD

## 2018-04-05 NOTE — Assessment & Plan Note (Signed)
Is now hypothyroid and doing well clinically. She is now following with Dr Cruzita Lederer of [facility] endocrinology and will follow up there in 6 months

## 2018-04-05 NOTE — Assessment & Plan Note (Signed)
Encouraged DASH diet, decrease po intake and increase exercise as tolerated. Needs 7-8 hours of sleep nightly. Avoid trans fats, eat small, frequent meals every 4-5 hours with lean proteins, complex carbs and healthy fats. Minimize simple carbs 

## 2018-04-05 NOTE — Assessment & Plan Note (Addendum)
Check level today, supplement

## 2018-04-05 NOTE — Assessment & Plan Note (Addendum)
Encouraged heart healthy diet, increase exercise, avoid trans fats, consider a krill oil cap daily, taking crestor Tuesday and Saturday and tolerating.

## 2018-04-05 NOTE — Patient Instructions (Addendum)
Vitamin D 2000 IU daily  Bone density shows osteopenia, which is thinner than normal but not as bad as osteoporosis. Recommend calcium intake of 1200 to 1500 mg daily, divided into roughly 3 doses. Best source is the diet and a single dairy serving is about 500 mg, a supplement of calcium citrate once or twice daily to balance diet is fine if not getting enough in diet. Also need Vitamin D 2000 IU caps, 1 cap daily if not already taking vitamin D. Also recommend weight baring exercise on hips and upper body to keep bones strong. Citracal petites  Flaxseed oil daily  MegaRed  Regular   Shingrix is the new shingles shot, 2-6 months, call insurance and confirm coverage and then call for nurse visit and immunization Preventive Care 40-64 Years, Female Preventive care refers to lifestyle choices and visits with your health care provider that can promote health and wellness. What does preventive care include?  A yearly physical exam. This is also called an annual well check.  Dental exams once or twice a year.  Routine eye exams. Ask your health care provider how often you should have your eyes checked.  Personal lifestyle choices, including: ? Daily care of your teeth and gums. ? Regular physical activity. ? Eating a healthy diet. ? Avoiding tobacco and drug use. ? Limiting alcohol use. ? Practicing safe sex. ? Taking low-dose aspirin daily starting at age 50. ? Taking vitamin and mineral supplements as recommended by your health care provider. What happens during an annual well check? The services and screenings done by your health care provider during your annual well check will depend on your age, overall health, lifestyle risk factors, and family history of disease. Counseling Your health care provider may ask you questions about your:  Alcohol use.  Tobacco use.  Drug use.  Emotional well-being.  Home and relationship well-being.  Sexual activity.  Eating habits.  Work  and work environment.  Method of birth control.  Menstrual cycle.  Pregnancy history.  Screening You may have the following tests or measurements:  Height, weight, and BMI.  Blood pressure.  Lipid and cholesterol levels. These may be checked every 5 years, or more frequently if you are over 50 years old.  Skin check.  Lung cancer screening. You may have this screening every year starting at age 55 if you have a 30-pack-year history of smoking and currently smoke or have quit within the past 15 years.  Fecal occult blood test (FOBT) of the stool. You may have this test every year starting at age 50.  Flexible sigmoidoscopy or colonoscopy. You may have a sigmoidoscopy every 5 years or a colonoscopy every 10 years starting at age 50.  Hepatitis C blood test.  Hepatitis B blood test.  Sexually transmitted disease (STD) testing.  Diabetes screening. This is done by checking your blood sugar (glucose) after you have not eaten for a while (fasting). You may have this done every 1-3 years.  Mammogram. This may be done every 1-2 years. Talk to your health care provider about when you should start having regular mammograms. This may depend on whether you have a family history of breast cancer.  BRCA-related cancer screening. This may be done if you have a family history of breast, ovarian, tubal, or peritoneal cancers.  Pelvic exam and Pap test. This may be done every 3 years starting at age 21. Starting at age 30, this may be done every 5 years if you have a Pap   test in combination with an HPV test.  Bone density scan. This is done to screen for osteoporosis. You may have this scan if you are at high risk for osteoporosis.  Discuss your test results, treatment options, and if necessary, the need for more tests with your health care provider. Vaccines Your health care provider may recommend certain vaccines, such as:  Influenza vaccine. This is recommended every year.  Tetanus,  diphtheria, and acellular pertussis (Tdap, Td) vaccine. You may need a Td booster every 10 years.  Varicella vaccine. You may need this if you have not been vaccinated.  Zoster vaccine. You may need this after age 60.  Measles, mumps, and rubella (MMR) vaccine. You may need at least one dose of MMR if you were born in 1957 or later. You may also need a second dose.  Pneumococcal 13-valent conjugate (PCV13) vaccine. You may need this if you have certain conditions and were not previously vaccinated.  Pneumococcal polysaccharide (PPSV23) vaccine. You may need one or two doses if you smoke cigarettes or if you have certain conditions.  Meningococcal vaccine. You may need this if you have certain conditions.  Hepatitis A vaccine. You may need this if you have certain conditions or if you travel or work in places where you may be exposed to hepatitis A.  Hepatitis B vaccine. You may need this if you have certain conditions or if you travel or work in places where you may be exposed to hepatitis B.  Haemophilus influenzae type b (Hib) vaccine. You may need this if you have certain conditions.  Talk to your health care provider about which screenings and vaccines you need and how often you need them. This information is not intended to replace advice given to you by your health care provider. Make sure you discuss any questions you have with your health care provider. Document Released: 10/11/2015 Document Revised: 06/03/2016 Document Reviewed: 07/16/2015 Elsevier Interactive Patient Education  2018 Elsevier Inc.  

## 2018-04-05 NOTE — Assessment & Plan Note (Signed)
[id number] acceptable, minimize simple carbs. Increase exercise as tolerated.  

## 2018-04-05 NOTE — Assessment & Plan Note (Signed)
On Levothyroxine, continue to monitor with endocrinology

## 2018-04-05 NOTE — Assessment & Plan Note (Signed)
Encouraged to get adequate exercise, calcium and vitamin d intake 

## 2018-04-06 LAB — HEPATITIS C ANTIBODY
Hepatitis C Ab: NONREACTIVE
SIGNAL TO CUT-OFF: 0.57 (ref <1.00)

## 2018-04-13 DIAGNOSIS — Z0000 Encounter for general adult medical examination without abnormal findings: Secondary | ICD-10-CM | POA: Insufficient documentation

## 2018-04-13 NOTE — Assessment & Plan Note (Signed)
Patient encouraged to maintain heart healthy diet, regular exercise, adequate sleep. Consider daily probiotics. Take medications as prescribed. Labs ordered and reviewed. Mammogram 08/29/2017. Colonoscopy 06/29/2017 repeat in 10 years. Given and reviewed copy of ACP documents from [employer] and encouraged to complete and return

## 2018-04-19 DIAGNOSIS — F4323 Adjustment disorder with mixed anxiety and depressed mood: Secondary | ICD-10-CM | POA: Diagnosis not present

## 2018-04-26 DIAGNOSIS — F4323 Adjustment disorder with mixed anxiety and depressed mood: Secondary | ICD-10-CM | POA: Diagnosis not present

## 2018-05-03 ENCOUNTER — Telehealth: Payer: Self-pay

## 2018-05-03 DIAGNOSIS — F4323 Adjustment disorder with mixed anxiety and depressed mood: Secondary | ICD-10-CM | POA: Diagnosis not present

## 2018-05-03 NOTE — Telephone Encounter (Signed)
Copied from [place] [id number]. Topic: General - Other >> May 02, 2018  1:25 PM Selinda Flavin B, NT wrote: Reason for CRM:  Patient calling and states that she received a bill for DOS 04/05/18. States that her insurance told her that the wrong diagnostic code was used (z72.89). Patient received a bill for $157. Patient would like this coding issue fixed.  CB#: [phone]    Please advise

## 2018-05-03 NOTE — Telephone Encounter (Signed)
Please follow up and figure out what went wrong that code is for hep C. Did it get applied to the visit somehow?

## 2018-05-10 DIAGNOSIS — F4323 Adjustment disorder with mixed anxiety and depressed mood: Secondary | ICD-10-CM | POA: Diagnosis not present

## 2018-05-24 DIAGNOSIS — F4323 Adjustment disorder with mixed anxiety and depressed mood: Secondary | ICD-10-CM | POA: Diagnosis not present

## 2018-05-31 ENCOUNTER — Telehealth: Payer: Self-pay | Admitting: *Deleted

## 2018-05-31 DIAGNOSIS — F4323 Adjustment disorder with mixed anxiety and depressed mood: Secondary | ICD-10-CM | POA: Diagnosis not present

## 2018-05-31 NOTE — Telephone Encounter (Signed)
Received Healthcare POA, has been scanned but needs PCP Initials on each page; forwarded to provider and will then have scanned again at front/SLS 09/03

## 2018-06-07 DIAGNOSIS — F4323 Adjustment disorder with mixed anxiety and depressed mood: Secondary | ICD-10-CM | POA: Diagnosis not present

## 2018-06-09 ENCOUNTER — Telehealth: Payer: Self-pay | Admitting: Family Medicine

## 2018-06-09 NOTE — Telephone Encounter (Signed)
Copied from [place] [id number]. Topic: General - Other >> Jun 08, 2018  4:37 PM Yvette Rack wrote: Reason for CRM: Pt called stating that she received a bill for DOS 04/05/18. Pt states that she was told that the wrong diagnostic code was used or another code could be used and she would like it corrected. Pt states she is being charged $145.11 from [facility] and [employer]. Cb# [phone]

## 2018-06-14 DIAGNOSIS — F4323 Adjustment disorder with mixed anxiety and depressed mood: Secondary | ICD-10-CM | POA: Diagnosis not present

## 2018-06-16 ENCOUNTER — Telehealth: Payer: Self-pay

## 2018-06-16 NOTE — Telephone Encounter (Signed)
[name] has routed this over to [facility] to see what we need to do.

## 2018-06-16 NOTE — Telephone Encounter (Signed)
Dawn, can you tell me how we can correct this?

## 2018-06-16 NOTE — Telephone Encounter (Signed)
I just cannot see what the problem even is please help this patient figure this out

## 2018-06-16 NOTE — Telephone Encounter (Signed)
Copied from [place] [id number]. Topic: General - Other >> Jun 16, 2018  1:52 PM Janace Aris A wrote: Patient called in for the 3 rd time wanting to have this coding situation figured out. Says she does not want to have to pay for this visit if she doesn't have to. She also feels like her pcp should be the one to change these codes so that way her insurance can pay. I spoke with Deveron Furlong- who says she will send an e-mail to someone in our coding dept. to see what they can do about this. Patient says she will come by the office this afternoon to drop off the bills so we can have copies on file.

## 2018-06-17 NOTE — Telephone Encounter (Signed)
Hello,   When a Hep C is being ordered as the one time test due to the Patient being born between [place] dx code Z11.59 should be associated as the diagnosis.   Dr. Jacinto Reap I will send you link with additional information via email.    Kim,  Not sure if you do this or if it is [place], [place] representative needs to be contacted and have them change the dx to Z11.59 and ask them to refile a corrected claim to the patient insurance. I can not have corrected since not [facility] billing.   Hope this helps,  Thanks,  [employer]

## 2018-06-17 NOTE — Telephone Encounter (Signed)
Spoke with patient- let her know that I will be calling [facility] to update correct dx code. Called [facility] billing- removed z72.89 added code z11.59. [facility] resubmitting claim.

## 2018-06-17 NOTE — Telephone Encounter (Signed)
Thanks [employer] If we could also let the patient know we are working on this that would be great. The Team in the lab talks to [place] a lot so they might be able to help as well.

## 2018-06-21 DIAGNOSIS — F4323 Adjustment disorder with mixed anxiety and depressed mood: Secondary | ICD-10-CM | POA: Diagnosis not present

## 2018-06-22 ENCOUNTER — Other Ambulatory Visit: Payer: Self-pay | Admitting: Obstetrics and Gynecology

## 2018-06-22 DIAGNOSIS — Z1231 Encounter for screening mammogram for malignant neoplasm of breast: Secondary | ICD-10-CM

## 2018-06-28 DIAGNOSIS — F4323 Adjustment disorder with mixed anxiety and depressed mood: Secondary | ICD-10-CM | POA: Diagnosis not present

## 2018-07-05 DIAGNOSIS — F4323 Adjustment disorder with mixed anxiety and depressed mood: Secondary | ICD-10-CM | POA: Diagnosis not present

## 2018-07-12 DIAGNOSIS — F4323 Adjustment disorder with mixed anxiety and depressed mood: Secondary | ICD-10-CM | POA: Diagnosis not present

## 2018-07-19 DIAGNOSIS — F4323 Adjustment disorder with mixed anxiety and depressed mood: Secondary | ICD-10-CM | POA: Diagnosis not present

## 2018-07-21 ENCOUNTER — Other Ambulatory Visit: Payer: Self-pay | Admitting: Gastroenterology

## 2018-07-25 DIAGNOSIS — F4323 Adjustment disorder with mixed anxiety and depressed mood: Secondary | ICD-10-CM | POA: Diagnosis not present

## 2018-07-26 DIAGNOSIS — F4323 Adjustment disorder with mixed anxiety and depressed mood: Secondary | ICD-10-CM | POA: Diagnosis not present

## 2018-08-02 DIAGNOSIS — F4323 Adjustment disorder with mixed anxiety and depressed mood: Secondary | ICD-10-CM | POA: Diagnosis not present

## 2018-08-03 DIAGNOSIS — F4323 Adjustment disorder with mixed anxiety and depressed mood: Secondary | ICD-10-CM | POA: Diagnosis not present

## 2018-08-09 DIAGNOSIS — F4323 Adjustment disorder with mixed anxiety and depressed mood: Secondary | ICD-10-CM | POA: Diagnosis not present

## 2018-08-10 DIAGNOSIS — F4323 Adjustment disorder with mixed anxiety and depressed mood: Secondary | ICD-10-CM | POA: Diagnosis not present

## 2018-08-15 ENCOUNTER — Ambulatory Visit: Payer: BLUE CROSS/BLUE SHIELD | Admitting: Internal Medicine

## 2018-08-15 ENCOUNTER — Encounter: Payer: Self-pay | Admitting: Internal Medicine

## 2018-08-15 VITALS — BP 102/70 | HR 84 | Ht 67.0 in | Wt 179.0 lb

## 2018-08-15 DIAGNOSIS — F4323 Adjustment disorder with mixed anxiety and depressed mood: Secondary | ICD-10-CM | POA: Diagnosis not present

## 2018-08-15 DIAGNOSIS — R7303 Prediabetes: Secondary | ICD-10-CM | POA: Diagnosis not present

## 2018-08-15 DIAGNOSIS — E890 Postprocedural hypothyroidism: Secondary | ICD-10-CM | POA: Diagnosis not present

## 2018-08-15 DIAGNOSIS — M8589 Other specified disorders of bone density and structure, multiple sites: Secondary | ICD-10-CM

## 2018-08-15 DIAGNOSIS — E782 Mixed hyperlipidemia: Secondary | ICD-10-CM | POA: Diagnosis not present

## 2018-08-15 DIAGNOSIS — C73 Malignant neoplasm of thyroid gland: Secondary | ICD-10-CM | POA: Insufficient documentation

## 2018-08-15 LAB — POCT GLYCOSYLATED HEMOGLOBIN (HGB A1C): Hemoglobin A1C: 5.1 % (ref 4.0–5.6)

## 2018-08-15 LAB — VITAMIN D 25 HYDROXY (VIT D DEFICIENCY, FRACTURES): VITD: 42.1 ng/mL (ref 30.00–100.00)

## 2018-08-15 NOTE — Progress Notes (Signed)
Patient ID: [name], female   DOB: [birth year], 62 y.o.   MRN: [id number]    HPI  [name] is a 62 y.o.-year-old female, returning for follow-up for follicular variant of papillary thyroid cancer and postsurgical hypothyroidism.  Last visit 6 months ago She moved to [facility]  from [place], where she was followed by endocrinology (reviewed notes by Dr. Virgina Evener). Prev. Seen by Dr. Forde Dandy and then [place] in town.  Pt. has been dx with thyroid cancer in 2012.   04/30/2017: total thyroidectomy - Dr Harlow Asa:     - Hurthle cell changes  2012: RAI Tx: 100 mci I131  02/06/2014: Neck ultrasound: negative for metastasis per review of previous endocrinologist notes.  However, there were 2 small rounded lymph nodes in the left neck, measuring 5.1 x 2.5 mm and 3.8 x 3.6 mm.  They did not have visible hilar lines.  02/25/2018: Neck ultrasound: No residual thyroid tissue or enlarged lymph nodes.  Excellent thyroglobulin at last check 6 months ago Lab Results  Component Value Date   THYROGLB <0.1 (L) 02/11/2018   THGAB <1 02/11/2018   01/23/2016: Tg <0.1 (IMA), ATA <1.0, TSH 0.186 02/06/2014: Tg <0.1, ATA <1.0, TSH 0.707 09/01/2011: Tg <0.5, ATA <20, TSH 0.140  For her postsurgical hypothyroidism, she is on Synthroid d.a.w. 112 mcg daily: - in am - fasting - at least 30 min from b'fast + Calcium at bedtime + PPIs before lunch + Zantac later in the day - not on Biotin  I reviewed pt's thyroid tests -normal 12/2017 Lab Results  Component Value Date   TSH 0.58 01/10/2018   TSH 0.76 08/07/2016   TSH 2.59 03/23/2016   TSH 1.15 09/03/2015   TSH 1.15 07/16/2014   FREET4 0.87 01/10/2018    Pt denies: - feeling nodules in neck - hoarseness - dysphagia - choking - SOB with lying down Has a h/o Es dilations x2.   She has + FH of thyroid disorders in: MGM (goiter); parathyroid ds. In M.  No family history of thyroid cancer No history of radiation treatment to head or neck  except for RAI treatment  Prediabetes: Lab Results  Component Value Date   HGBA1C 6.0 04/05/2018   Father with DM2.  She has osteopenia.   She was on Evista but she could not tolerate it due to headaches  [facility]): L1-L4: -2.0 RFN: -1.9 LFN: -1.2 This was worse compared to DXA scan report from 08/15/2014: L1-L4: -1.6 RFN: -0.7 LFN: -1.2  She also has a history of hyperlipidemia: Lab Results  Component Value Date   CHOL 191 04/05/2018   HDL 54.60 04/05/2018   LDLCALC 117 (H) 04/05/2018   LDLDIRECT 212.6 08/10/2012   TRIG 97.0 04/05/2018   CHOLHDL 4 04/05/2018  She is on Crestor 5 mg twice a week.  She also has a history of significant GERD.  She is on 50,0000 IU  - not consistently - took 1 last week.  She is usually taking 1000 units vitamin D but not daily.  ROS: Constitutional: no weight gain/no weight loss, no fatigue, no subjective hyperthermia, no subjective hypothermia Eyes: no blurry vision, no xerophthalmia ENT: no sore throat, + see HPI Cardiovascular: no CP/no SOB/no palpitations/no leg swelling Respiratory: no cough/no SOB/no wheezing Gastrointestinal: no N/no V/no D/no C/no acid reflux Musculoskeletal: no muscle aches/no joint aches Skin: no rashes, no hair loss Neurological: no tremors/no numbness/no tingling/no dizziness  I reviewed pt's medications, allergies, PMH, social  hx, family hx, and changes were documented in the history of present illness. Otherwise, unchanged from my initial visit note.  Past Medical History:  Diagnosis Date  . Acute upper respiratory infection 09/13/2016  . Allergic rhinitis   . Allergy   . Anxiety state 08/24/2016  . Barrett's esophagus without dysplasia    pt says was suspected never confirmed 9 or 10 years ago  . Cancer [facility])    thyroid cancer  . Depression   . Esophagitis   . Fundic gland polyps of stomach, benign   . GERD (gastroesophageal reflux disease)   . Hemorrhoids    . History of hiatal hernia   . History of ovarian cyst   . History of thyroid cancer    05-01-2011  s/p  total thyroidectomy for multinodular goiter and papillary carcinoma  . Hyperglycemia 03/01/2016  . Hyperlipidemia    borderline  . Hypothyroidism, postsurgical   . Internal hemorrhoids   . Lumbar spondylosis    L4 -- S1  . Osteoporosis    osteopenia  . Overweight 08/14/2009   Qualifier: Diagnosis of  By: Niel Hummer MD, Lorinda Creed   . Palpitations 03/01/2016  . PONV (postoperative nausea and vomiting)    nausea with pain meds  . TMJ (temporomandibular joint disorder)    Past Surgical History:  Procedure Laterality Date  . APPENDECTOMY  01-30-2008  . CARPAL TUNNEL RELEASE Right 08/30/2014   Procedure: RIGHT CARPAL TUNNEL RELEASE;  Surgeon: Linna Hoff, MD;  Location: [facility];  Service: Orthopedics;  Laterality: Right;  . COLONOSCOPY  08-08-2007  . HEMORRHOID SURGERY    . LAPAROSCOPIC RIGHT OOPHORECTOMY  1996  . LAPAROSCOPIC TUBAL LIGATION  07-21-2002   W/   Dilatation and evacuation  . LEFT SHOULDER CLOSED MANIPULATION/  ARTHROSCOPIC DEBRIDEMENT LABRUM/  SAD/ RELEASE  CA LIGAMENT/ BURSECTOMY  10-12-2011  . PARTIAL KNEE ARTHROPLASTY Right 12/09/2015   Procedure: RIGHT KNEE MEDIAL UNICOMPARTMENTAL ARTHROPLASTY ;  Surgeon: Gaynelle Arabian, MD;  Location: [facility];  Service: Orthopedics;  Laterality: Right;  . TOTAL THYROIDECTOMY  05-01-2011  . TRANSOBTURATOR SLING  01-07-2005   W/ CYSTOCELE REPAIR  . UPPER GASTROINTESTINAL ENDOSCOPY  last one 06-29-2014   Social History   Socioeconomic History  . Marital status: Married    Spouse name: Not on file  . Number of children: 2  . Years of education: Not on file  . Highest education level: Not on file  Occupational History  . Occupation: Cabin crew  Social Needs  . Financial resource strain: Not on file  . Food insecurity:    Worry: Not on file    Inability: Not on file  . Transportation needs:    Medical:  Not on file    Non-medical: Not on file  Tobacco Use  . Smoking status: Former Smoker    Years: 5.00    Types: Cigarettes    Start date: 09/28/1984    Last attempt to quit: 08/28/1985    Years since quitting: 32.9  . Smokeless tobacco: Never Used  Substance and Sexual Activity  . Alcohol use: Yes    Comment: rarely  . Drug use: No  . Sexual activity: Not on file  Lifestyle  . Physical activity:    Days per week: Not on file    Minutes per session: Not on file  . Stress: Not on file  Relationships  . Social connections:    Talks on phone: Not on file    Gets together: Not on file  Attends religious service: Not on file    Active member of club or organization: Not on file    Attends meetings of clubs or organizations: Not on file    Relationship status: Not on file  . Intimate partner violence:    Fear of current or ex partner: Not on file    Emotionally abused: Not on file    Physically abused: Not on file    Forced sexual activity: Not on file  Other Topics Concern  . Not on file  Social History Narrative  . Not on file   Current Outpatient Medications on File Prior to Visit  Medication Sig Dispense Refill  . Calcium Carbonate-Vit D-Min (CALTRATE PLUS PO) Take 1,200 mg by mouth daily.    Marland Kitchen escitalopram (LEXAPRO) 10 MG tablet Take 1 tablet by mouth daily.  2  . esomeprazole (NEXIUM) 20 MG capsule TAKE 1 CAPSULE TWICE DAILY BEFORE MEALS 60 capsule 0  . hydrocortisone cream 1 % Apply 1 application topically 2 (two) times daily as needed.     [name] 500 MG CAPS Take 1 capsule (500 mg total) by mouth daily.    . montelukast (SINGULAIR) 10 MG tablet Take 1 tablet (10 mg total) by mouth daily as needed. 90 tablet 1  . promethazine (PHENERGAN) 25 MG suppository Place 1 suppository (25 mg total) rectally every 6 (six) hours as needed for nausea or vomiting. 12 each 0  . ranitidine (ZANTAC) 150 MG tablet Take 1 tablet (150 mg total) by mouth at bedtime as needed for  heartburn. 90 tablet 1  . rosuvastatin (CRESTOR) 5 MG tablet Take 1 tablet (5 mg total) by mouth at bedtime. 90 tablet 1  . SYNTHROID 112 MCG tablet Take 1 tablet (112 mcg total) by mouth daily. 90 tablet 3  . Vitamin D, Ergocalciferol, (DRISDOL) 50000 units CAPS capsule Take 1 capsule (50,000 Units total) by mouth every 7 (seven) days. 12 capsule 1   No current facility-administered medications on file prior to visit.    Allergies  Allergen Reactions  . Celestone [Betamethasone Sodium Phosphate] Other (See Comments)    "Feels hot and looks sunburned"  . Droperidol Other (See Comments)    "feels like I have withdrawal symptoms"  . Omni-Pac [Cefdinir] Diarrhea and Other (See Comments)    Other Reaction: Other reaction  . Statins Other (See Comments)    "joint stiffness"  . Hydrocodone Itching and Rash   Family History  Problem Relation Age of Onset  . Diabetes Father   . Cancer Father        prostate  . Stroke Father        bacterial endocarditis after prostate surgery  . Heart disease Father        aortic valvular disease, MI  . Thyroid disease Mother        hyperparathyroidism  . Alzheimer's disease Mother   . Hyperparathyroidism Mother   . Atrial fibrillation Mother   . Hypertension Mother   . Hyperlipidemia Mother   . Thyroid disease Maternal Grandmother   . Hyperlipidemia Brother   . Hypertension Brother   . Allergies Brother   . Heart disease Daughter        palpitations  . Esophageal cancer Neg Hx   . Colon cancer Neg Hx   . Colon polyps Neg Hx   . Rectal cancer Neg Hx   . Stomach cancer Neg Hx     PE: BP 102/70   Pulse 84   Ht 5'  7" (1.702 m) Comment: measured  Wt 179 lb (81.2 kg)   SpO2 97%   BMI 28.04 kg/m  Wt Readings from Last 3 Encounters:  08/15/18 179 lb (81.2 kg)  04/05/18 176 lb 12.8 oz (80.2 kg)  02/11/18 180 lb 3.2 oz (81.7 kg)   Constitutional: overweight, in NAD Eyes: PERRLA, EOMI, no exophthalmos ENT: moist mucous membranes, no  thyroid masses palpated,  no cervical lymphadenopathy Cardiovascular: RRR, No MRG Respiratory: CTA B Gastrointestinal: abdomen soft, NT, ND, BS+ Musculoskeletal: no deformities, strength intact in all 4 Skin: moist, warm, no rashes Neurological: no tremor with outstretched hands, DTR normal in all 4  ASSESSMENT: 1. Thyroid cancer - see HPI  2. Postsurgical Hypothyroidism  3. Prediabetes  4. HL  5. Osteopenia  PLAN:  1. Follicular variant of papillary thyroid cancer  - Patient is stage I TNM of the lowest risk of papillary thyroid cancer.  This is a slow-growing cancer with good prognosis so her life expectancy is unlikely to be reduced due to this diagnosis.  The cancer was encapsulated, subcentimeter, without lymphovascular invasion which further improves her prognosis.  She had capsular invasion, though, but this still qualifies as low risk for the newest thyroid cancer guidelines - patient does not appear to have any neck compression symptoms and I cannot feel any masses in her neck - We reviewed together her thyroglobulin and ATA from last visit and these were undetectable - We also reviewed the report of her thyroid ultrasound obtained after last visit and this did not show any recurrence or suspicious masses in her neck - We will repeat her thyroglobulin and ATA antibodies at next visit, in 6 months  2. Patient with h/o total thyroidectomy for cancer, now with iatrogenic hypothyroidism, on levothyroxine therapy.  - latest thyroid labs reviewed with pt >> normal  - she continues on Synthroid d.a.w. 112 mcg daily - pt feels good on this dose. - we discussed about taking the thyroid hormone every day, with water, >30 minutes before breakfast, separated by >4 hours from acid reflux medications, calcium, iron, multivitamins. Pt. is taking it correctly.  3. Prediabetes -Reviewed latest HbA1c which was slightly higher, but still at goal, of 6.0% -today HbA1c better: 5.1%  (better)  4. HL - Reviewed latest lipid panel from 03/2018: LDL greatly improved from 2013, now only slightly above goal Lab Results  Component Value Date   CHOL 191 04/05/2018   HDL 54.60 04/05/2018   LDLCALC 117 (H) 04/05/2018   LDLDIRECT 212.6 08/10/2012   TRIG 97.0 04/05/2018   CHOLHDL 4 04/05/2018  - Continues Crestor 5 mg twice a week without side effects  5. Osteopenia -Reviewed her latest 2 DEXA scan reports: Latest bone density (10/14/2017) was much worse >> I suggested to start Fosamax. -Last visit I advised her to switch from calcium carbonate to calcium citrate as this is better absorbed in the setting of PPI use -Recent vitamin D level was normal in 03/2018 -However, she is not consistent with her vitamin D dosing.  She gets some vitamin D from calcium citrate + D, but unclear how much. -We will recheck her vitamin D level at this visit -We discussed about possibly treating her osteopenia especially since last bone density showed worse scores, but she is not keen to start on medication.  We cannot use oral bisphosphonates due to her history of significant GERD.  In that case, we discussed about trying to maintain a good vitamin D level but I also strongly  suggested that she started weightbearing exercises.  I gave her a list of exercises recommended by the National osteoporosis foundation. -We will plan to repeat another bone density scan 2 years from the previous.  She needs to get [facility].  Office Visit on 08/15/2018  Component Date Value Ref Range Status  . VITD 08/15/2018 42.10  30.00 - 100.00 ng/mL Final  . Hemoglobin A1C 08/15/2018 5.1  4.0 - 5.6 % Final   Vitamin D level normal.  Philemon Kingdom, MD PhD [facility]

## 2018-08-15 NOTE — Patient Instructions (Signed)
Please stop at the lab.  Exercise for Strong Bones (from [place]) There are two types of exercises that are important for building and maintaining bone density:  weight-bearing and muscle-strengthening exercises. Weight-bearing Exercises These exercises include activities that make you move against gravity while staying upright. Weight-bearing exercises can be high-impact or low-impact. High-impact weight-bearing exercises help build bones and keep them strong. If you have broken a bone due to osteoporosis or are at risk of breaking a bone, you may need to avoid high-impact exercises. If you're not sure, you should check with your healthcare provider. Examples of high-impact weight-bearing exercises are: . Dancing . Doing high-impact aerobics . Hiking . Jogging/running . Jumping Rope . Stair climbing . Tennis Low-impact weight-bearing exercises can also help keep bones strong and are a safe alternative if you cannot do high-impact exercises. Examples of low-impact weight-bearing exercises are: . Using elliptical training machines . Doing low-impact aerobics . Using stair-step machines . Fast walking on a treadmill or outside Muscle-Strengthening Exercises These exercises include activities where you move your body, a weight or some other resistance against gravity. They are also known as resistance exercises and include: . Lifting weights . Using elastic exercise bands . Using weight machines . Lifting your own body weight . Functional movements, such as standing and rising up on your toes Yoga and Pilates can also improve strength, balance and flexibility. However, certain positions may not be safe for people with osteoporosis or those at increased risk of broken bones. For example, exercises that have you bend forward may increase the chance of breaking a bone in the spine. A physical therapist should be able to help you learn which exercises are safe and  appropriate for you. Non-Impact Exercises Non-impact exercises can help you to improve balance, posture and how well you move in everyday activities. These exercises can also help to increase muscle strength and decrease the risk of falls and broken bones. Some of these exercises include: . Balance exercises that strengthen your legs and test your balance, such as Tai Chi, can decrease your risk of falls. . Posture exercises that improve your posture and reduce rounded or "sloping" shoulders can help you decrease the chance of breaking a bone, especially in the spine. . Functional exercises that improve how well you move can help you with everyday activities and decrease your chance of falling and breaking a bone. For example, if you have trouble getting up from a chair or climbing stairs, you should do these activities as exercises. A physical therapist can teach you balance, posture and functional exercises. Starting a New Exercise Program If you haven't exercised regularly for a while, check with your healthcare provider before beginning a new exercise program-particularly if you have health problems such as heart disease, diabetes or high blood pressure. If you're at high risk of breaking a bone, you should work with a physical therapist to develop a safe exercise program. Once you have your healthcare provider's approval, start slowly. If you've already broken bones in the spine because of osteoporosis, be very careful to avoid activities that require reaching down, bending forward, rapid twisting motions, heavy lifting and those that increase your chance of a fall. As you get started, your muscles may feel sore for a day or two after you exercise. If soreness lasts longer, you may be working too hard and need to ease up. Exercises should be done in a pain-free range of motion. How Much Exercise Do You Need? Weight-bearing exercises  30 minutes on most days of the week. Do a 30-minutesession or  multiple sessions spread out throughout the day. The benefits to your bones are the same.   Muscle-strengthening exercises Two to three days per week. If you don't have much time for strengthening/resistance training, do small amounts at a time. You can do just one body part each day. For example do arms one day, legs the next and trunk the next. You can also spread these exercises out during your normal day.  Balance, posture and functional exercises Every day or as often as needed. You may want to focus on one area more than the others. If you have fallen or lose your balance, spend time doing balance exercises. If you are getting rounded shoulders, work more on posture exercises. If you have trouble climbing stairs or getting up from the couch, do more functional exercises. You can also perform these exercises at one time or spread them during your day. Work with a phyiscal therapist to learn the right exercises for you.

## 2018-08-16 DIAGNOSIS — F4323 Adjustment disorder with mixed anxiety and depressed mood: Secondary | ICD-10-CM | POA: Diagnosis not present

## 2018-08-22 ENCOUNTER — Telehealth: Payer: Self-pay | Admitting: *Deleted

## 2018-08-22 NOTE — Telephone Encounter (Signed)
Copied from [place] [id number]. Topic: Appointment Scheduling - Scheduling Inquiry for Clinic >> Aug 22, 2018 12:19 PM Cecelia Byars, NT wrote: Reason for HKU:[id number] called and would like an appointment with Dr Quintella Baton  there is nothing available ,she is scheduled with Dr Carollee Herter , please advise [phone]

## 2018-08-23 ENCOUNTER — Ambulatory Visit: Payer: Self-pay | Admitting: Family Medicine

## 2018-08-23 DIAGNOSIS — F4323 Adjustment disorder with mixed anxiety and depressed mood: Secondary | ICD-10-CM | POA: Diagnosis not present

## 2018-08-30 DIAGNOSIS — F4323 Adjustment disorder with mixed anxiety and depressed mood: Secondary | ICD-10-CM | POA: Diagnosis not present

## 2018-08-31 ENCOUNTER — Ambulatory Visit
Admission: RE | Admit: 2018-08-31 | Discharge: 2018-08-31 | Disposition: A | Discharge status: Home / Self Care | Payer: BLUE CROSS/BLUE SHIELD | Source: Ambulatory Visit | Attending: Obstetrics and Gynecology | Admitting: Obstetrics and Gynecology

## 2018-08-31 DIAGNOSIS — Z1231 Encounter for screening mammogram for malignant neoplasm of breast: Secondary | ICD-10-CM | POA: Diagnosis not present

## 2018-09-12 ENCOUNTER — Other Ambulatory Visit: Payer: Self-pay | Admitting: Gastroenterology

## 2018-09-12 DIAGNOSIS — F4323 Adjustment disorder with mixed anxiety and depressed mood: Secondary | ICD-10-CM | POA: Diagnosis not present

## 2018-09-13 DIAGNOSIS — F4323 Adjustment disorder with mixed anxiety and depressed mood: Secondary | ICD-10-CM | POA: Diagnosis not present

## 2018-09-14 DIAGNOSIS — F4323 Adjustment disorder with mixed anxiety and depressed mood: Secondary | ICD-10-CM | POA: Diagnosis not present

## 2018-09-19 DIAGNOSIS — F4323 Adjustment disorder with mixed anxiety and depressed mood: Secondary | ICD-10-CM | POA: Diagnosis not present

## 2018-09-22 DIAGNOSIS — F4323 Adjustment disorder with mixed anxiety and depressed mood: Secondary | ICD-10-CM | POA: Diagnosis not present

## 2018-09-27 DIAGNOSIS — F4323 Adjustment disorder with mixed anxiety and depressed mood: Secondary | ICD-10-CM | POA: Diagnosis not present

## 2018-10-01 DIAGNOSIS — J029 Acute pharyngitis, unspecified: Secondary | ICD-10-CM | POA: Diagnosis not present

## 2018-10-01 DIAGNOSIS — R5383 Other fatigue: Secondary | ICD-10-CM | POA: Diagnosis not present

## 2018-10-01 DIAGNOSIS — J40 Bronchitis, not specified as acute or chronic: Secondary | ICD-10-CM | POA: Diagnosis not present

## 2018-10-01 DIAGNOSIS — R05 Cough: Secondary | ICD-10-CM | POA: Diagnosis not present

## 2018-10-07 ENCOUNTER — Ambulatory Visit: Payer: BLUE CROSS/BLUE SHIELD | Admitting: Family Medicine

## 2018-10-07 ENCOUNTER — Encounter: Payer: Self-pay | Admitting: Family Medicine

## 2018-10-07 DIAGNOSIS — E782 Mixed hyperlipidemia: Secondary | ICD-10-CM | POA: Diagnosis not present

## 2018-10-07 DIAGNOSIS — E559 Vitamin D deficiency, unspecified: Secondary | ICD-10-CM

## 2018-10-07 DIAGNOSIS — E890 Postprocedural hypothyroidism: Secondary | ICD-10-CM

## 2018-10-07 DIAGNOSIS — R7303 Prediabetes: Secondary | ICD-10-CM | POA: Diagnosis not present

## 2018-10-07 DIAGNOSIS — K219 Gastro-esophageal reflux disease without esophagitis: Secondary | ICD-10-CM

## 2018-10-07 MED ORDER — ROSUVASTATIN CALCIUM 5 MG PO TABS: 5.0000 mg | ORAL_TABLET | Freq: Every day | ORAL | 1 refills | Status: DC

## 2018-10-07 MED ORDER — FAMOTIDINE 20 MG PO TABS: 20.0000 mg | ORAL_TABLET | Freq: Two times a day (BID) | ORAL | 3 refills | Status: DC | PRN

## 2018-10-07 MED ORDER — SULFAMETHOXAZOLE-TRIMETHOPRIM 800-160 MG PO TABS: 1.0000 | ORAL_TABLET | Freq: Two times a day (BID) | ORAL | 0 refills | Status: DC

## 2018-10-07 NOTE — Assessment & Plan Note (Signed)
Encouraged heart healthy diet, increase exercise, avoid trans fats, consider a krill oil cap daily 

## 2018-10-07 NOTE — Patient Instructions (Addendum)
Encouraged increased rest and hydration, add probiotics, zinc such as Coldeze or Xicam. Treat fevers as needed. Vitamin C 500 to 1000 mg daily and elderberry  Consider adding Cetirizine/Zyrtec 10 mg daily or Loratadine/Claritin, Flonase nasal spray daily and nasal saline flushes a couple times a day Shingrix is the new shingles shot, 2 shots over 2-6 months check with insurance regarding coverage and document then call for nurse appt. Tdap also due  Food Choices for Gastroesophageal Reflux Disease, Adult When you have gastroesophageal reflux disease (GERD), the foods you eat and your eating habits are very important. Choosing the right foods can help ease your discomfort. Think about working with a nutrition specialist (dietitian) to help you make good choices. What are tips for following this plan?  Meals  Choose healthy foods that are low in fat, such as fruits, vegetables, whole grains, low-fat dairy products, and lean meat, fish, and poultry.  Eat small meals often instead of 3 large meals a day. Eat your meals slowly, and in a place where you are relaxed. Avoid bending over or lying down until 2-3 hours after eating.  Avoid eating meals 2-3 hours before bed.  Avoid drinking a lot of liquid with meals.  Cook foods using methods other than frying. Bake, grill, or broil food instead.  Avoid or limit: ? Chocolate. ? Peppermint or spearmint. ? Alcohol. ? Pepper. ? Black and decaffeinated coffee. ? Black and decaffeinated tea. ? Bubbly (carbonated) soft drinks. ? Caffeinated energy drinks and soft drinks.  Limit high-fat foods such as: ? Fatty meat or fried foods. ? Whole milk, cream, butter, or ice cream. ? Nuts and nut butters. ? Pastries, donuts, and sweets made with butter or shortening.  Avoid foods that cause symptoms. These foods may be different for everyone. Common foods that cause symptoms include: ? Tomatoes. ? Oranges, lemons, and limes. ? Peppers. ? Spicy  food. ? Onions and garlic. ? Vinegar. Lifestyle  Maintain a healthy weight. Ask your doctor what weight is healthy for you. If you need to lose weight, work with your doctor to do so safely.  Exercise for at least 30 minutes for 5 or more days each week, or as told by your doctor.  Wear loose-fitting clothes.  Do not smoke. If you need help quitting, ask your doctor.  Sleep with the head of your bed higher than your feet. Use a wedge under the mattress or blocks under the bed frame to raise the head of the bed. Summary  When you have gastroesophageal reflux disease (GERD), food and lifestyle choices are very important in easing your symptoms.  Eat small meals often instead of 3 large meals a day. Eat your meals slowly, and in a place where you are relaxed.  Limit high-fat foods such as fatty meat or fried foods.  Avoid bending over or lying down until 2-3 hours after eating.  Avoid peppermint and spearmint, caffeine, alcohol, and chocolate. This information is not intended to replace advice given to you by your health care provider. Make sure you discuss any questions you have with your health care provider. Document Released: 03/15/2012 Document Revised: 10/20/2016 Document Reviewed: 10/20/2016 Elsevier Interactive Patient Education  2019 [employer].

## 2018-10-07 NOTE — Assessment & Plan Note (Signed)
Supplement and monitor 

## 2018-10-07 NOTE — Assessment & Plan Note (Signed)
[id number] acceptable, minimize simple carbs. Increase exercise as tolerated.  

## 2018-10-09 NOTE — Assessment & Plan Note (Signed)
On Levothyroxine, continue to monitor 

## 2018-10-09 NOTE — Progress Notes (Signed)
Subjective:    Patient ID: [name], female    DOB: Dec 24, 1955, 63 y.o.   MRN: [id number]  No chief complaint on file.   HPI Patient is in today for follow up. She is doing well but needs to stop the zantac. No dyspepsiaor dysuria. No recent febrile illness or hospitalizations. Has been having head congestion with fatigue, nasal and head pressure. Rhinorrhea which is lingering. Denies CP/palp/SOB/HA/congestion/fevers/GI or GU c/o. Taking meds as prescribed  Past Medical History:  Diagnosis Date  . Acute upper respiratory infection 09/13/2016  . Allergic rhinitis   . Allergy   . Anxiety state 08/24/2016  . Barrett's esophagus without dysplasia    pt says was suspected never confirmed 9 or 10 years ago  . Cancer [facility])    thyroid cancer  . Depression   . Esophagitis   . Fundic gland polyps of stomach, benign   . GERD (gastroesophageal reflux disease)   . Hemorrhoids   . History of hiatal hernia   . History of ovarian cyst   . History of thyroid cancer    05-01-2011  s/p  total thyroidectomy for multinodular goiter and papillary carcinoma  . Hyperglycemia 03/01/2016  . Hyperlipidemia    borderline  . Hypothyroidism, postsurgical   . Internal hemorrhoids   . Lumbar spondylosis    L4 -- S1  . Osteoporosis    osteopenia  . Overweight 08/14/2009   Qualifier: Diagnosis of  By: Niel Hummer MD, Lorinda Creed   . Palpitations 03/01/2016  . PONV (postoperative nausea and vomiting)    nausea with pain meds  . TMJ (temporomandibular joint disorder)     Past Surgical History:  Procedure Laterality Date  . APPENDECTOMY  01-30-2008  . CARPAL TUNNEL RELEASE Right 08/30/2014   Procedure: RIGHT CARPAL TUNNEL RELEASE;  Surgeon: Linna Hoff, MD;  Location: [facility];  Service: Orthopedics;  Laterality: Right;  . COLONOSCOPY  08-08-2007  . HEMORRHOID SURGERY    . LAPAROSCOPIC RIGHT OOPHORECTOMY  1996  . LAPAROSCOPIC TUBAL LIGATION  07-21-2002   W/   Dilatation  and evacuation  . LEFT SHOULDER CLOSED MANIPULATION/  ARTHROSCOPIC DEBRIDEMENT LABRUM/  SAD/ RELEASE  CA LIGAMENT/ BURSECTOMY  10-12-2011  . PARTIAL KNEE ARTHROPLASTY Right 12/09/2015   Procedure: RIGHT KNEE MEDIAL UNICOMPARTMENTAL ARTHROPLASTY ;  Surgeon: Gaynelle Arabian, MD;  Location: [facility];  Service: Orthopedics;  Laterality: Right;  . TOTAL THYROIDECTOMY  05-01-2011  . TRANSOBTURATOR SLING  01-07-2005   W/ CYSTOCELE REPAIR  . UPPER GASTROINTESTINAL ENDOSCOPY  last one 06-29-2014    Family History  Problem Relation Age of Onset  . Diabetes Father   . Cancer Father        prostate  . Stroke Father        bacterial endocarditis after prostate surgery  . Heart disease Father        aortic valvular disease, MI  . Thyroid disease Mother        hyperparathyroidism  . Alzheimer's disease Mother   . Hyperparathyroidism Mother   . Atrial fibrillation Mother   . Hypertension Mother   . Hyperlipidemia Mother   . Thyroid disease Maternal Grandmother   . Hyperlipidemia Brother   . Hypertension Brother   . Allergies Brother   . Heart disease Daughter        palpitations  . Esophageal cancer Neg Hx   . Colon cancer Neg Hx   . Colon polyps Neg Hx   . Rectal cancer Neg Hx   .  Stomach cancer Neg Hx     Social History   Socioeconomic History  . Marital status: Married    Spouse name: Not on file  . Number of children: 2  . Years of education: Not on file  . Highest education level: Not on file  Occupational History  . Occupation: Cabin crew  Social Needs  . Financial resource strain: Not on file  . Food insecurity:    Worry: Not on file    Inability: Not on file  . Transportation needs:    Medical: Not on file    Non-medical: Not on file  Tobacco Use  . Smoking status: Former Smoker    Years: 5.00    Types: Cigarettes    Start date: 09/28/1984    Last attempt to quit: 08/28/1985    Years since quitting: 33.1  . Smokeless tobacco: Never Used  Substance and Sexual Activity  .  Alcohol use: Yes    Comment: rarely  . Drug use: No  . Sexual activity: Not on file  Lifestyle  . Physical activity:    Days per week: Not on file    Minutes per session: Not on file  . Stress: Not on file  Relationships  . Social connections:    Talks on phone: Not on file    Gets together: Not on file    Attends religious service: Not on file    Active member of club or organization: Not on file    Attends meetings of clubs or organizations: Not on file    Relationship status: Not on file  . Intimate partner violence:    Fear of current or ex partner: Not on file    Emotionally abused: Not on file    Physically abused: Not on file    Forced sexual activity: Not on file  Other Topics Concern  . Not on file  Social History Narrative  . Not on file    Outpatient Medications Prior to Visit  Medication Sig Dispense Refill  . Calcium Carbonate-Vit D-Min (CALTRATE PLUS PO) Take 1,200 mg by mouth daily.    Marland Kitchen escitalopram (LEXAPRO) 10 MG tablet Take 1 tablet by mouth daily.  2  . esomeprazole (NEXIUM) 20 MG capsule TAKE 1 CAPSULE TWICE DAILY BEFORE MEALS 60 capsule 0  . hydrocortisone cream 1 % Apply 1 application topically 2 (two) times daily as needed.     [name] 500 MG CAPS Take 1 capsule (500 mg total) by mouth daily.    . montelukast (SINGULAIR) 10 MG tablet Take 1 tablet (10 mg total) by mouth daily as needed. 90 tablet 1  . promethazine (PHENERGAN) 25 MG suppository Place 1 suppository (25 mg total) rectally every 6 (six) hours as needed for nausea or vomiting. 12 each 0  . SYNTHROID 112 MCG tablet Take 1 tablet (112 mcg total) by mouth daily. 90 tablet 3  . Vitamin D, Ergocalciferol, (DRISDOL) 50000 units CAPS capsule Take 1 capsule (50,000 Units total) by mouth every 7 (seven) days. 12 capsule 1  . ranitidine (ZANTAC) 150 MG tablet Take 1 tablet (150 mg total) by mouth at bedtime as needed for heartburn. 90 tablet 1  . rosuvastatin (CRESTOR) 5 MG tablet Take 1 tablet (5  mg total) by mouth at bedtime. 90 tablet 1   No facility-administered medications prior to visit.     Allergies  Allergen Reactions  . Celestone [Betamethasone Sodium Phosphate] Other (See Comments)    "Feels hot and looks sunburned"  .  Droperidol Other (See Comments)    "feels like I have withdrawal symptoms"  . Omni-Pac [Cefdinir] Diarrhea and Other (See Comments)    Other Reaction: Other reaction  . Statins Other (See Comments)    "joint stiffness"  . Hydrocodone Itching and Rash    Review of Systems  Constitutional: Negative for fever and malaise/fatigue.  HENT: Negative for congestion.   Eyes: Negative for blurred vision.  Respiratory: Negative for shortness of breath.   Cardiovascular: Negative for chest pain, palpitations and leg swelling.  Gastrointestinal: Negative for abdominal pain, blood in stool and nausea.  Genitourinary: Negative for dysuria and frequency.  Musculoskeletal: Negative for falls.  Skin: Negative for rash.  Neurological: Negative for dizziness, loss of consciousness and headaches.  Endo/Heme/Allergies: Negative for environmental allergies.  Psychiatric/Behavioral: Negative for depression. The patient is not nervous/anxious.        Objective:    Physical Exam Constitutional:      General: She is not in acute distress.    Appearance: She is well-developed.  HENT:     Head: Normocephalic and atraumatic.  Eyes:     Conjunctiva/sclera: Conjunctivae normal.  Neck:     Musculoskeletal: Neck supple.     Thyroid: No thyromegaly.  Cardiovascular:     Rate and Rhythm: Normal rate and regular rhythm.     Heart sounds: Normal heart sounds. No murmur.  Pulmonary:     Effort: Pulmonary effort is normal. No respiratory distress.     Breath sounds: Normal breath sounds.  Abdominal:     General: Bowel sounds are normal. There is no distension.     Palpations: Abdomen is soft. There is no mass.     Tenderness: There is no abdominal tenderness.    Lymphadenopathy:     Cervical: No cervical adenopathy.  Skin:    General: Skin is warm and dry.  Neurological:     Mental Status: She is alert and oriented to person, place, and time.  Psychiatric:        Behavior: Behavior normal.     BP 124/90 (BP Location: Left Arm, Patient Position: Sitting, Cuff Size: Normal)   Pulse 68   Temp 97.8 F (36.6 C) (Oral)   Resp 18   Wt 181 lb 6.4 oz (82.3 kg)   SpO2 98%   BMI 28.41 kg/m  Wt Readings from Last 3 Encounters:  10/07/18 181 lb 6.4 oz (82.3 kg)  08/15/18 179 lb (81.2 kg)  04/05/18 176 lb 12.8 oz (80.2 kg)     Lab Results  Component Value Date   WBC 4.6 04/05/2018   HGB 13.5 04/05/2018   HCT 39.9 04/05/2018   PLT 230.0 04/05/2018   GLUCOSE 95 04/05/2018   CHOL 191 04/05/2018   TRIG 97.0 04/05/2018   HDL 54.60 04/05/2018   LDLDIRECT 212.6 08/10/2012   LDLCALC 117 (H) 04/05/2018   ALT 12 04/05/2018   AST 15 04/05/2018   NA 143 04/05/2018   K 4.2 04/05/2018   CL 105 04/05/2018   CREATININE 0.65 04/05/2018   BUN 12 04/05/2018   CO2 30 04/05/2018   TSH 0.58 01/10/2018   INR 1.10 12/03/2015   HGBA1C 5.1 08/15/2018    Lab Results  Component Value Date   TSH 0.58 01/10/2018   Lab Results  Component Value Date   WBC 4.6 04/05/2018   HGB 13.5 04/05/2018   HCT 39.9 04/05/2018   MCV 89.6 04/05/2018   PLT 230.0 04/05/2018   Lab Results  Component Value Date  NA 143 04/05/2018   K 4.2 04/05/2018   CO2 30 04/05/2018   GLUCOSE 95 04/05/2018   BUN 12 04/05/2018   CREATININE 0.65 04/05/2018   BILITOT 0.5 04/05/2018   ALKPHOS 83 04/05/2018   AST 15 04/05/2018   ALT 12 04/05/2018   PROT 6.7 04/05/2018   ALBUMIN 4.5 04/05/2018   CALCIUM 8.9 04/05/2018   ANIONGAP 8 12/11/2015   GFR 98.55 04/05/2018   Lab Results  Component Value Date   CHOL 191 04/05/2018   Lab Results  Component Value Date   HDL 54.60 04/05/2018   Lab Results  Component Value Date   LDLCALC 117 (H) 04/05/2018   Lab Results   Component Value Date   TRIG 97.0 04/05/2018   Lab Results  Component Value Date   CHOLHDL 4 04/05/2018   Lab Results  Component Value Date   HGBA1C 5.1 08/15/2018       Assessment & Plan:   Problem List Items Addressed This Visit    Postsurgical hypothyroidism (Chronic)    On Levothyroxine, continue to monitor      Prediabetes (Chronic)    hgba1c acceptable, minimize simple carbs. Increase exercise as tolerated.       Mixed hyperlipidemia    Encouraged heart healthy diet, increase exercise, avoid trans fats, consider a krill oil cap daily      Relevant Medications   rosuvastatin (CRESTOR) 5 MG tablet   Acid reflux    Avoid offending foods, start probiotics. Do not eat large meals in late evening and consider raising head of bed.       Relevant Medications   famotidine (PEPCID) 20 MG tablet   Vitamin D deficiency    Supplement and monitor         I have discontinued Fern P. Federici "[name]"'s ranitidine. I am also having her start on famotidine and sulfamethoxazole-trimethoprim. Additionally, I am having her maintain her hydrocortisone cream, promethazine, Krill Oil, Calcium Carbonate-Vit D-Min (CALTRATE PLUS PO), Vitamin D (Ergocalciferol), escitalopram, SYNTHROID, montelukast, esomeprazole, and rosuvastatin.  Meds ordered this encounter  Medications  . famotidine (PEPCID) 20 MG tablet    Sig: Take 1 tablet (20 mg total) by mouth 2 (two) times daily as needed for heartburn or indigestion.    Dispense:  60 tablet    Refill:  3  . sulfamethoxazole-trimethoprim (BACTRIM DS,SEPTRA DS) 800-160 MG tablet    Sig: Take 1 tablet by mouth 2 (two) times daily.    Dispense:  20 tablet    Refill:  0  . rosuvastatin (CRESTOR) 5 MG tablet    Sig: Take 1 tablet (5 mg total) by mouth at bedtime.    Dispense:  90 tablet    Refill:  1     Penni Homans, MD

## 2018-10-09 NOTE — Assessment & Plan Note (Signed)
Avoid offending foods, start probiotics. Do not eat large meals in late evening and consider raising head of bed.  

## 2018-10-10 DIAGNOSIS — Z01419 Encounter for gynecological examination (general) (routine) without abnormal findings: Secondary | ICD-10-CM | POA: Diagnosis not present

## 2018-10-10 DIAGNOSIS — Z6828 Body mass index (BMI) 28.0-28.9, adult: Secondary | ICD-10-CM | POA: Diagnosis not present

## 2018-10-17 DIAGNOSIS — F4323 Adjustment disorder with mixed anxiety and depressed mood: Secondary | ICD-10-CM | POA: Diagnosis not present

## 2018-10-18 DIAGNOSIS — F4323 Adjustment disorder with mixed anxiety and depressed mood: Secondary | ICD-10-CM | POA: Diagnosis not present

## 2018-10-25 DIAGNOSIS — F4323 Adjustment disorder with mixed anxiety and depressed mood: Secondary | ICD-10-CM | POA: Diagnosis not present

## 2018-10-26 DIAGNOSIS — D171 Benign lipomatous neoplasm of skin and subcutaneous tissue of trunk: Secondary | ICD-10-CM | POA: Diagnosis not present

## 2018-10-26 DIAGNOSIS — L820 Inflamed seborrheic keratosis: Secondary | ICD-10-CM | POA: Diagnosis not present

## 2018-10-26 DIAGNOSIS — L821 Other seborrheic keratosis: Secondary | ICD-10-CM | POA: Diagnosis not present

## 2018-10-26 DIAGNOSIS — I781 Nevus, non-neoplastic: Secondary | ICD-10-CM | POA: Diagnosis not present

## 2018-10-26 DIAGNOSIS — L918 Other hypertrophic disorders of the skin: Secondary | ICD-10-CM | POA: Diagnosis not present

## 2018-11-01 DIAGNOSIS — F4323 Adjustment disorder with mixed anxiety and depressed mood: Secondary | ICD-10-CM | POA: Diagnosis not present

## 2018-11-08 ENCOUNTER — Other Ambulatory Visit: Payer: Self-pay | Admitting: Gastroenterology

## 2018-11-08 ENCOUNTER — Telehealth: Payer: Self-pay | Admitting: Gastroenterology

## 2018-11-08 DIAGNOSIS — F4323 Adjustment disorder with mixed anxiety and depressed mood: Secondary | ICD-10-CM | POA: Diagnosis not present

## 2018-11-08 NOTE — Telephone Encounter (Signed)
Spoke with the patient. She has a couple of issues. She reports terrible break through GERD symptoms that have been unrelieved with the medications she is on. She needs advise on this. She also thinks she needs another banding but was lost to follow up. Appointment given to the patient for 11/09/18

## 2018-11-09 ENCOUNTER — Ambulatory Visit: Payer: BLUE CROSS/BLUE SHIELD | Admitting: Gastroenterology

## 2018-11-09 ENCOUNTER — Encounter: Payer: Self-pay | Admitting: Gastroenterology

## 2018-11-09 VITALS — BP 110/68 | HR 84 | Ht 67.0 in | Wt 186.2 lb

## 2018-11-09 DIAGNOSIS — K640 First degree hemorrhoids: Secondary | ICD-10-CM

## 2018-11-09 DIAGNOSIS — K219 Gastro-esophageal reflux disease without esophagitis: Secondary | ICD-10-CM | POA: Diagnosis not present

## 2018-11-09 DIAGNOSIS — R12 Heartburn: Secondary | ICD-10-CM | POA: Diagnosis not present

## 2018-11-09 NOTE — Patient Instructions (Addendum)
You have been scheduled for an endoscopy. Please follow written instructions given to you at your visit today. If you use inhalers (even only as needed), please bring them with you on the day of your procedure.   Continue Nexium 20 mg daily before dinner  STOP Apple Cider Vinegar   If you are age 62 or older, your body mass index should be between 23-30. Your Body mass index is 29.17 kg/m. If this is out of the aforementioned range listed, please consider follow up with your Primary Care Provider.  If you are age 78 or younger, your body mass index should be between 19-25. Your Body mass index is 29.17 kg/m. If this is out of the aformentioned range listed, please consider follow up with your Primary Care Provider.    I appreciate the  opportunity to care for you  Thank You   Harl Bowie , MD

## 2018-11-09 NOTE — Progress Notes (Signed)
[name]    [id number]    1956/04/28  Primary Care Physician:Blyth, Bonnita Levan, MD  Referring Physician: Mosie Lukes, MD [address] [place], [place] [address]  Chief complaint: GERD HPI: 63 year old female with history of chronic GERD here for follow-up visit Nexium 20mg  daily and Zantac at bedtime, after recall switched to pepcid which is not working as effectively as Zantac. On some days she needs to take Nexium twice daily.  She started taking apple cider vinegar capsules the past few weeks, thinks her symptoms started after that. She has history of Barrett's esophagus, last EGD in 2015 with biopsies negative for Barrett's but prior to that March 2012 biopsies of 1 cm segment positive for Barrett's. Taking probiotic daily with improvement of constipation.  Is post hemorrhoidal band ligation X2.  Denies any rectal bleeding but feels excess tissue in the rectal/perianal area.  Has occasional burning sensation but no itching or fecal seepage.    Outpatient Encounter Medications as of 11/09/2018  Medication Sig  . calcium citrate-vitamin D (CITRACAL+D) 315-200 MG-UNIT tablet Take 1 tablet by mouth daily.  Marland Kitchen escitalopram (LEXAPRO) 10 MG tablet Take 1 tablet by mouth daily.  Marland Kitchen esomeprazole (NEXIUM) 20 MG capsule TAKE 1 CAPSULE TWICE DAILY BEFORE MEALS  . famotidine (PEPCID) 20 MG tablet Take 1 tablet (20 mg total) by mouth 2 (two) times daily as needed for heartburn or indigestion.  . hydrocortisone cream 1 % Apply 1 application topically 2 (two) times daily as needed.   [name] 500 MG CAPS Take 1 capsule (500 mg total) by mouth daily.  . montelukast (SINGULAIR) 10 MG tablet Take 1 tablet (10 mg total) by mouth daily as needed.  . promethazine (PHENERGAN) 25 MG suppository Place 1 suppository (25 mg total) rectally every 6 (six) hours as needed for nausea or vomiting.  . rosuvastatin (CRESTOR) 5 MG tablet Take 1 tablet (5 mg total) by mouth at  bedtime. (Patient taking differently: Take 5 mg by mouth at bedtime. Takes on Tues and Saturday)  . sulfamethoxazole-trimethoprim (BACTRIM DS,SEPTRA DS) 800-160 MG tablet Take 1 tablet by mouth 2 (two) times daily.  Marland Kitchen SYNTHROID 112 MCG tablet Take 1 tablet (112 mcg total) by mouth daily.  . Vitamin D, Ergocalciferol, (DRISDOL) 50000 units CAPS capsule Take 1 capsule (50,000 Units total) by mouth every 7 (seven) days. (Patient taking differently: Take 50,000 Units by mouth every 7 (seven) days. Takes prn)  . [DISCONTINUED] Calcium Carbonate-Vit D-Min (CALTRATE PLUS PO) Take 1,200 mg by mouth daily.   No facility-administered encounter medications on file as of 11/09/2018.     Allergies as of 11/09/2018 - Review Complete 11/09/2018  Allergen Reaction Noted  . Celestone [betamethasone sodium phosphate] Other (See Comments) 10/12/2011  . Droperidol Other (See Comments) 10/12/2011  . Omni-pac [cefdinir] Diarrhea and Other (See Comments) 08/28/2014  . Statins Other (See Comments) 08/28/2014  . Hydrocodone Itching and Rash 11/22/2015    Past Medical History:  Diagnosis Date  . Acute upper respiratory infection 09/13/2016  . Allergic rhinitis   . Allergy   . Anxiety state 08/24/2016  . Barrett's esophagus without dysplasia    pt says was suspected never confirmed 9 or 10 years ago  . Cancer [facility])    thyroid cancer  . Depression   . Esophagitis   . Fundic gland polyps of stomach, benign   . GERD (gastroesophageal reflux disease)   . Hemorrhoids   .  History of hiatal hernia   . History of ovarian cyst   . History of thyroid cancer    05-01-2011  s/p  total thyroidectomy for multinodular goiter and papillary carcinoma  . Hyperglycemia 03/01/2016  . Hyperlipidemia    borderline  . Hypothyroidism, postsurgical   . Internal hemorrhoids   . Lumbar spondylosis    L4 -- S1  . Osteoporosis    osteopenia  . Overweight 08/14/2009   Qualifier: Diagnosis of  By: Niel Hummer MD, Lorinda Creed   .  Palpitations 03/01/2016  . PONV (postoperative nausea and vomiting)    nausea with pain meds  . TMJ (temporomandibular joint disorder)     Past Surgical History:  Procedure Laterality Date  . APPENDECTOMY  01-30-2008  . CARPAL TUNNEL RELEASE Right 08/30/2014   Procedure: RIGHT CARPAL TUNNEL RELEASE;  Surgeon: Linna Hoff, MD;  Location[facility];  Service: Orthopedics;  Laterality: Right;  . COLONOSCOPY  08-08-2007  . HEMORRHOID SURGERY    . LAPAROSCOPIC RIGHT OOPHORECTOMY  1996  . LAPAROSCOPIC TUBAL LIGATION  07-21-2002   W/   Dilatation and evacuation  . LEFT SHOULDER CLOSED MANIPULATION/  ARTHROSCOPIC DEBRIDEMENT LABRUM/  SAD/ RELEASE  CA LIGAMENT/ BURSECTOMY  10-12-2011  . PARTIAL KNEE ARTHROPLASTY Right 12/09/2015   Procedure: RIGHT KNEE MEDIAL UNICOMPARTMENTAL ARTHROPLASTY ;  Surgeon: Gaynelle Arabian, MD;  Location: [facility];  Service: Orthopedics;  Laterality: Right;  . TOTAL THYROIDECTOMY  05-01-2011  . TRANSOBTURATOR SLING  01-07-2005   W/ CYSTOCELE REPAIR  . UPPER GASTROINTESTINAL ENDOSCOPY  last one 06-29-2014    Family History  Problem Relation Age of Onset  . Diabetes Father   . Cancer Father        prostate  . Stroke Father        bacterial endocarditis after prostate surgery  . Heart disease Father        aortic valvular disease, MI  . Thyroid disease Mother        hyperparathyroidism  . Alzheimer's disease Mother   . Hyperparathyroidism Mother   . Atrial fibrillation Mother   . Hypertension Mother   . Hyperlipidemia Mother   . Thyroid disease Maternal Grandmother   . Hyperlipidemia Brother   . Hypertension Brother   . Allergies Brother   . Heart disease Daughter        palpitations  . Esophageal cancer Neg Hx   . Colon cancer Neg Hx   . Colon polyps Neg Hx   . Rectal cancer Neg Hx   . Stomach cancer Neg Hx     Social History   Socioeconomic History  . Marital status: Married    Spouse name: Not on file  . Number of children: 2    . Years of education: Not on file  . Highest education level: Not on file  Occupational History  . Occupation: Cabin crew  Social Needs  . Financial resource strain: Not on file  . Food insecurity:    Worry: Not on file    Inability: Not on file  . Transportation needs:    Medical: Not on file    Non-medical: Not on file  Tobacco Use  . Smoking status: Former Smoker    Years: 5.00    Types: Cigarettes    Start date: 09/28/1984    Last attempt to quit: 08/28/1985    Years since quitting: 33.2  . Smokeless tobacco: Never Used  Substance and Sexual Activity  . Alcohol use: Yes    Comment:  rarely  . Drug use: No  . Sexual activity: Not on file  Lifestyle  . Physical activity:    Days per week: Not on file    Minutes per session: Not on file  . Stress: Not on file  Relationships  . Social connections:    Talks on phone: Not on file    Gets together: Not on file    Attends religious service: Not on file    Active member of club or organization: Not on file    Attends meetings of clubs or organizations: Not on file    Relationship status: Not on file  . Intimate partner violence:    Fear of current or ex partner: Not on file    Emotionally abused: Not on file    Physically abused: Not on file    Forced sexual activity: Not on file  Other Topics Concern  . Not on file  Social History Narrative  . Not on file      Review of systems: Review of Systems  Constitutional: Negative for fever and chills.  HENT: Negative.   Eyes: Negative for blurred vision.  Respiratory: Negative for cough, shortness of breath and wheezing.   Cardiovascular: Negative for chest pain and palpitations.  Gastrointestinal: as per HPI Genitourinary: Negative for dysuria, urgency, frequency and hematuria.  Musculoskeletal: Negative for myalgias, back pain and joint pain.  Skin: Negative for itching and rash.  Neurological: Negative for dizziness, tremors, focal weakness, seizures and loss of  consciousness.  Endo/Heme/Allergies: Positive for seasonal allergies.  Psychiatric/Behavioral: Negative for depression, suicidal ideas and hallucinations.  All other systems reviewed and are negative.   Physical Exam: Vitals:   11/09/18 1516  BP: 110/68  Pulse: 84   Body mass index is 29.17 kg/m. Gen:      No acute distress HEENT:  EOMI, sclera anicteric Neck:     No masses; no thyromegaly Lungs:    Clear to auscultation bilaterally; normal respiratory effort CV:         Regular rate and rhythm; no murmurs Abd:      + bowel sounds; soft, non-tender; no palpable masses, no distension Ext:    No edema; adequate peripheral perfusion Skin:      Warm and dry; no rash Neuro: alert and oriented x 3 Psych: normal mood and affect Rectal exam: Normal anal sphincter tone, no anal fissure or external hemorrhoids Anoscopy: Small internal hemorrhoids, no active bleeding, normal dentate line, no visible nodules  Data Reviewed:  Reviewed labs, radiology imaging, old records and pertinent past GI work up   Assessment and Plan/Recommendations:  63 year old female with history of chronic GERD and Barrett's esophagus Recurrent GERD symptoms despite use of PPI Continue Nexium 20 mg daily before dinner and H2 blocker at bedtime as needed Stop apple cider vinegar Discussed antireflux measures and lifestyle modification in detail Given last EGD was  5 years ago, recent exacerbation of GERD symptoms, will schedule EGD for evaluation Patient is anxious and worried about Barrett's esophagus.  Reassured her, last EGD in 2015 biopsies were negative for Barrett's.  She had a short segment 1 cm Barrett's esophagus in 2012.  Small grade 1 internal hemorrhoids on rectal exam and anoscopy External anal skin tag Do not recommend hemorrhoidal band ligation  Colonoscopy October 2018: Diverticulosis internal hemorrhoids.  Biopsies negative for active inflammation Due for screening colonoscopy October  2028   The risks and benefits as well as alternatives of endoscopic procedure(s) have been discussed and reviewed. All  questions answered. The patient agrees to proceed.  Damaris Hippo , MD [phone]    CC: Mosie Lukes, MD

## 2018-11-11 ENCOUNTER — Encounter: Payer: Self-pay | Admitting: Gastroenterology

## 2018-11-11 ENCOUNTER — Ambulatory Visit (AMBULATORY_SURGERY_CENTER): Payer: BLUE CROSS/BLUE SHIELD | Admitting: Gastroenterology

## 2018-11-11 VITALS — BP 101/70 | HR 69 | Temp 98.4°F | Resp 10 | Ht 67.0 in | Wt 186.0 lb

## 2018-11-11 DIAGNOSIS — K317 Polyp of stomach and duodenum: Secondary | ICD-10-CM

## 2018-11-11 DIAGNOSIS — K449 Diaphragmatic hernia without obstruction or gangrene: Secondary | ICD-10-CM

## 2018-11-11 DIAGNOSIS — K219 Gastro-esophageal reflux disease without esophagitis: Secondary | ICD-10-CM

## 2018-11-11 MED ORDER — SODIUM CHLORIDE 0.9 % IV SOLN: 500.0000 mL | Freq: Once | INTRAVENOUS | Status: DC

## 2018-11-11 NOTE — Progress Notes (Signed)
Report to PACU, RN, vss, BBS= Clear.  

## 2018-11-11 NOTE — Op Note (Signed)
[facility] Patient Name: Otis Burress Procedure Date: 11/11/2018 9:21 AM MRN: [id number] Endoscopist: Mauri Pole , MD Age: 63 Referring MD:  Date of Birth: 01/11/1956 Gender: Female Account #: [id number] Procedure:                Upper GI endoscopy Indications:              Esophageal reflux symptoms that recur despite                            appropriate therapy, Barrett's esophagus, short                            segment <1cm Medicines:                Monitored Anesthesia Care Procedure:                Pre-Anesthesia Assessment:                           - Prior to the procedure, a History and Physical                            was performed, and patient medications and                            allergies were reviewed. The patient's tolerance of                            previous anesthesia was also reviewed. The risks                            and benefits of the procedure and the sedation                            options and risks were discussed with the patient.                            All questions were answered, and informed consent                            was obtained. Prior Anticoagulants: The patient has                            taken no previous anticoagulant or antiplatelet                            agents. ASA Grade Assessment: II - A patient with                            mild systemic disease. After reviewing the risks                            and benefits, the patient was deemed in  satisfactory condition to undergo the procedure.                           After obtaining informed consent, the endoscope was                            passed under direct vision. Throughout the                            procedure, the patient's blood pressure, pulse, and                            oxygen saturations were monitored continuously. The                            Model GIF-[id number]) scope was  introduced                            through the mouth, and advanced to the second part                            of duodenum. The upper GI endoscopy was                            accomplished without difficulty. The patient                            tolerated the procedure well. Scope In: Scope Out: Findings:                 A 5 cm hiatal hernia was present.                           The Z-line was irregular and was found 31 cm from                            the incisors with ultrashort segment <1cm tongue of                            salmon pink mucosa X1.                           A few less than 5 mm sessile polyps with no                            bleeding and no stigmata of recent bleeding were                            found in the gastric fundus, likely benign fundic                            gland polyps                           The examined duodenum  was normal. Complications:            No immediate complications. Estimated Blood Loss:     Estimated blood loss was minimal. Impression:               - 5 cm hiatal hernia.                           - Z-line irregular, 31 cm from the incisors.                           - A few gastric polyps.                           - Normal examined duodenum.                           - No specimens collected. Recommendation:           - Patient has a contact number available for                            emergencies. The signs and symptoms of potential                            delayed complications were discussed with the                            patient. Return to normal activities tomorrow.                            Written discharge instructions were provided to the                            patient.                           - Resume previous diet.                           - Continue present medications. Mauri Pole, MD 11/11/2018 10:03:25 AM This report has been signed electronically.

## 2018-11-11 NOTE — Patient Instructions (Signed)
YOU HAD AN ENDOSCOPIC PROCEDURE TODAY AT [facility]:   Refer to the procedure report that was given to you for any specific questions about what was found during the examination.  If the procedure report does not answer your questions, please call your gastroenterologist to clarify.  If you requested that your care partner not be given the details of your procedure findings, then the procedure report has been included in a sealed envelope for you to review at your convenience later.  YOU SHOULD EXPECT: Some feelings of bloating in the abdomen. Passage of more gas than usual.  Walking can help get rid of the air that was put into your GI tract during the procedure and reduce the bloating. If you had a lower endoscopy (such as a colonoscopy or flexible sigmoidoscopy) you may notice spotting of blood in your stool or on the toilet paper. If you underwent a bowel prep for your procedure, you may not have a normal bowel movement for a few days.  Please Note:  You might notice some irritation and congestion in your nose or some drainage.  This is from the oxygen used during your procedure.  There is no need for concern and it should clear up in a day or so.  SYMPTOMS TO REPORT IMMEDIATELY:    Following upper endoscopy (EGD)  Vomiting of blood or coffee ground material  New chest pain or pain under the shoulder blades  Painful or persistently difficult swallowing  New shortness of breath  Fever of 100F or higher  Black, tarry-looking stools  For urgent or emergent issues, a gastroenterologist can be reached at any hour by calling [phone].   DIET:  We do recommend a small meal at first, but then you may proceed to your regular diet.  Drink plenty of fluids but you should avoid alcoholic beverages for 24 hours.  ACTIVITY:  You should plan to take it easy for the rest of today and you should NOT DRIVE or use heavy machinery until tomorrow (because of the sedation medicines used  during the test).    FOLLOW UP: Our staff will call the number listed on your records the next business day following your procedure to check on you and address any questions or concerns that you may have regarding the information given to you following your procedure. If we do not reach you, we will leave a message.  However, if you are feeling well and you are not experiencing any problems, there is no need to return our call.  We will assume that you have returned to your regular daily activities without incident.  If any biopsies were taken you will be contacted by phone or by letter within the next 1-3 weeks.  Please call us at [phone] if you have not heard about the biopsies in 3 weeks.    SIGNATURES/CONFIDENTIALITY: You and/or your care partner have signed paperwork which will be entered into your electronic medical record.  These signatures attest to the fact that that the information above on your After Visit Summary has been reviewed and is understood.  Full responsibility of the confidentiality of this discharge information lies with you and/or your care-partner.  Hiatal hernia information given.

## 2018-11-14 ENCOUNTER — Telehealth: Payer: Self-pay | Admitting: *Deleted

## 2018-11-14 NOTE — Telephone Encounter (Signed)
  Follow up Call-  Call back number 11/11/2018 06/29/2017  Post procedure Call Back phone  # [phone]  Permission to leave phone message Yes Yes  Some recent data might be hidden     Patient questions:  Do you have a fever, pain , or abdominal swelling? No. Pain Score  0 *  Have you tolerated food without any problems? Yes.    Have you been able to return to your normal activities? Yes.    Do you have any questions about your discharge instructions: Diet   No. Medications  No. Follow up visit  No.  Do you have questions or concerns about your Care? No.  Actions: * If pain score is 4 or above: No action needed, pain <4.

## 2018-11-22 DIAGNOSIS — F4323 Adjustment disorder with mixed anxiety and depressed mood: Secondary | ICD-10-CM | POA: Diagnosis not present

## 2018-12-06 DIAGNOSIS — F4323 Adjustment disorder with mixed anxiety and depressed mood: Secondary | ICD-10-CM | POA: Diagnosis not present

## 2018-12-16 ENCOUNTER — Other Ambulatory Visit: Payer: Self-pay | Admitting: Internal Medicine

## 2018-12-16 ENCOUNTER — Other Ambulatory Visit: Payer: Self-pay | Admitting: Gastroenterology

## 2018-12-20 DIAGNOSIS — F4323 Adjustment disorder with mixed anxiety and depressed mood: Secondary | ICD-10-CM | POA: Diagnosis not present

## 2018-12-22 DIAGNOSIS — F4323 Adjustment disorder with mixed anxiety and depressed mood: Secondary | ICD-10-CM | POA: Diagnosis not present

## 2018-12-29 DIAGNOSIS — F4323 Adjustment disorder with mixed anxiety and depressed mood: Secondary | ICD-10-CM | POA: Diagnosis not present

## 2019-01-02 DIAGNOSIS — F4323 Adjustment disorder with mixed anxiety and depressed mood: Secondary | ICD-10-CM | POA: Diagnosis not present

## 2019-01-03 DIAGNOSIS — F4323 Adjustment disorder with mixed anxiety and depressed mood: Secondary | ICD-10-CM | POA: Diagnosis not present

## 2019-01-05 DIAGNOSIS — F4323 Adjustment disorder with mixed anxiety and depressed mood: Secondary | ICD-10-CM | POA: Diagnosis not present

## 2019-01-12 DIAGNOSIS — F4323 Adjustment disorder with mixed anxiety and depressed mood: Secondary | ICD-10-CM | POA: Diagnosis not present

## 2019-01-17 DIAGNOSIS — F4323 Adjustment disorder with mixed anxiety and depressed mood: Secondary | ICD-10-CM | POA: Diagnosis not present

## 2019-01-19 DIAGNOSIS — F4323 Adjustment disorder with mixed anxiety and depressed mood: Secondary | ICD-10-CM | POA: Diagnosis not present

## 2019-01-24 ENCOUNTER — Other Ambulatory Visit: Payer: Self-pay | Admitting: Gastroenterology

## 2019-01-24 MED ORDER — ESOMEPRAZOLE MAGNESIUM 20 MG PO CPDR: DELAYED_RELEASE_CAPSULE | ORAL | 11 refills | Status: DC

## 2019-01-24 NOTE — Telephone Encounter (Signed)
Ok to send the refill. Thanks

## 2019-01-24 NOTE — Telephone Encounter (Signed)
Prescription sent to patient's pharmacy and patient notified.  

## 2019-01-26 DIAGNOSIS — F4323 Adjustment disorder with mixed anxiety and depressed mood: Secondary | ICD-10-CM | POA: Diagnosis not present

## 2019-01-31 DIAGNOSIS — F4323 Adjustment disorder with mixed anxiety and depressed mood: Secondary | ICD-10-CM | POA: Diagnosis not present

## 2019-02-02 DIAGNOSIS — F4323 Adjustment disorder with mixed anxiety and depressed mood: Secondary | ICD-10-CM | POA: Diagnosis not present

## 2019-02-09 DIAGNOSIS — F4323 Adjustment disorder with mixed anxiety and depressed mood: Secondary | ICD-10-CM | POA: Diagnosis not present

## 2019-02-14 DIAGNOSIS — F4323 Adjustment disorder with mixed anxiety and depressed mood: Secondary | ICD-10-CM | POA: Diagnosis not present

## 2019-02-15 ENCOUNTER — Other Ambulatory Visit: Payer: Self-pay

## 2019-02-16 ENCOUNTER — Other Ambulatory Visit: Payer: Self-pay

## 2019-02-16 ENCOUNTER — Ambulatory Visit: Payer: BLUE CROSS/BLUE SHIELD | Admitting: Internal Medicine

## 2019-02-16 VITALS — Wt 186.0 lb

## 2019-02-16 DIAGNOSIS — C73 Malignant neoplasm of thyroid gland: Secondary | ICD-10-CM

## 2019-02-16 DIAGNOSIS — E890 Postprocedural hypothyroidism: Secondary | ICD-10-CM

## 2019-02-16 DIAGNOSIS — E782 Mixed hyperlipidemia: Secondary | ICD-10-CM

## 2019-02-16 DIAGNOSIS — M8589 Other specified disorders of bone density and structure, multiple sites: Secondary | ICD-10-CM | POA: Diagnosis not present

## 2019-02-16 DIAGNOSIS — F4323 Adjustment disorder with mixed anxiety and depressed mood: Secondary | ICD-10-CM | POA: Diagnosis not present

## 2019-02-16 DIAGNOSIS — R7303 Prediabetes: Secondary | ICD-10-CM | POA: Diagnosis not present

## 2019-02-16 LAB — TSH: TSH: 0.26 u[IU]/mL — ABNORMAL LOW (ref 0.35–4.50)

## 2019-02-16 LAB — LIPID PANEL
Cholesterol: 211 mg/dL — ABNORMAL HIGH (ref 0–200)
HDL: 60.2 mg/dL (ref >39.00)
LDL Cholesterol: 133 mg/dL — ABNORMAL HIGH (ref 0–99)
NonHDL: 151.14
Total CHOL/HDL Ratio: 4
Triglycerides: 93 mg/dL (ref 0.0–149.0)
VLDL: 18.6 mg/dL (ref 0.0–40.0)

## 2019-02-16 LAB — T4, FREE: Free T4: 1.13 ng/dL (ref 0.60–1.60)

## 2019-02-16 LAB — HEMOGLOBIN A1C: Hgb A1c MFr Bld: 5.9 % (ref 4.6–6.5)

## 2019-02-16 LAB — VITAMIN D 25 HYDROXY (VIT D DEFICIENCY, FRACTURES): VITD: 31.72 ng/mL (ref 30.00–100.00)

## 2019-02-16 NOTE — Progress Notes (Signed)
Patient ID: [name], female   DOB: [birth year], 63 y.o.   MRN: [id number]   HPI  [name] is a 63 y.o.-year-old female, presenting for follow-up for follicular variant of papillary thyroid cancer and postsurgical hypothyroidism.  Last visit 62 months ago. She moved to [place]  from [place], where she was followed by endocrinology (reviewed notes by Dr. Virgina Evener). Prev. Seen by Dr. Forde Dandy and then [place] in town.  Reviewed and addended her history of thyroid cancer: - Dx'ed 2012   04/30/2017: total thyroidectomy - Dr Harlow Asa:     - Hurthle cell changes  2012: RAI Tx: 100 mci I131  02/06/2014: Neck ultrasound: negative for metastasis per review of previous endocrinologist notes.  However, there were 2 small rounded lymph nodes in the left neck, measuring 5.1 x 2.5 mm and 3.8 x 3.6 mm.  They did not have visible hilar lines.  02/25/2018: Neck ultrasound: No residual thyroid tissue or enlarged lymph nodes  Thyroglobulin levels reviewed: Lab Results  Component Value Date   THYROGLB <0.1 (L) 02/11/2018   THGAB <1 02/11/2018  01/23/2016: Tg <0.1 (IMA), ATA <1.0, TSH 0.186 02/06/2014: Tg <0.1, ATA <1.0, TSH 0.707 09/01/2011: Tg <0.5, ATA <20, TSH 0.140  Postsurgical hypothyroidism:  Pt is on Synthroid 112 Mcg daily, taken: - in am - fasting - at least 30 min from b'fast - + Ca, PPIs (Nexium) -  later in the day - no iron, MVIs - not on Biotin  I reviewed patient's TFTs: Lab Results  Component Value Date   TSH 0.58 01/10/2018   TSH 0.76 08/07/2016   TSH 2.59 03/23/2016   TSH 1.15 09/03/2015   TSH 1.15 07/16/2014   FREET4 0.87 01/10/2018    Pt denies: - feeling nodules in neck - hoarseness - dysphagia - choking - SOB with lying down  He has a history of esophageal dilations x2.  She has + FH of thyroid disorders in: MGM (goiter); parathyroid ds. In [place].  No FH of thyroid cancer. No h/o radiation tx to head or neck except for RAI treatment..  No  herbal supplements. No Biotin use. No recent steroids use.   Prediabetes:  Her HbA1c level improved: Lab Results  Component Value Date   HGBA1C 5.1 08/15/2018   HGBA1C 6.0 04/05/2018   HGBA1C 5.9 11/30/2017   HGBA1C 6.0 01/29/2017   HGBA1C 5.8 08/07/2016   HGBA1C 5.9 02/21/2016   HGBA1C 5.9 09/03/2015   Father with DM2.  She has osteopenia.   She was on Evista in the past but she could not tolerate it due to headaches. At last visit, I suggested Fosamax, but she did not want to start any medications at that time.  Also, she has a significant history of GERD.  [facility] L1-L4: -2.0 RFN: -1.9 LFN: -1.2 This was worse compared to DXA scan report from 08/15/2014: L1-L4: -1.6 RFN: -0.7 LFN: -1.2  Hyperlipidemia: Lipid Panel :    Component Value Date/Time   CHOL 191 04/05/2018 1040   TRIG 97.0 04/05/2018 1040   HDL 54.60 04/05/2018 1040   CHOLHDL 4 04/05/2018 1040   VLDL 19.4 04/05/2018 1040   LDLCALC 117 (H) 04/05/2018 1040   LDLDIRECT 212.6 08/10/2012 1012  She continues on Crestor 5 mg twice a week. Also MegaRed.  She was previously on 50,000 units vitamin D weekly, but not consistent with this.  She is currently on 1000 units vitamin D daily. She is also on Citracal +  D.  She is walking daily.  She recently separated from her husband for 30 years. He has Renal cell CA and now has ESRD.  ROS: Constitutional: + weight gain/no weight loss, no fatigue, no subjective hyperthermia, no subjective hypothermia Eyes: no blurry vision, no xerophthalmia ENT: no sore throat, + see HPI Cardiovascular: no CP/no SOB/no palpitations/no leg swelling Respiratory: no cough/no SOB/no wheezing Gastrointestinal: no N/no V/no D/no C/no acid reflux Musculoskeletal: no muscle aches/no joint aches Skin: no rashes, no hair loss Neurological: no tremors/no numbness/no tingling/no dizziness  I reviewed pt's medications, allergies, PMH, social hx, family  hx, and changes were documented in the history of present illness. Otherwise, unchanged from my initial visit note.  Past Medical History:  Diagnosis Date  . Acute upper respiratory infection 09/13/2016  . Allergic rhinitis   . Allergy   . Anxiety state 08/24/2016  . Barrett's esophagus without dysplasia    pt says was suspected never confirmed 9 or 10 years ago  . Cancer [facility])    thyroid cancer  . Depression   . Esophagitis   . Fundic gland polyps of stomach, benign   . GERD (gastroesophageal reflux disease)   . Hemorrhoids   . History of hiatal hernia   . History of ovarian cyst   . History of thyroid cancer    05-01-2011  s/p  total thyroidectomy for multinodular goiter and papillary carcinoma  . Hyperglycemia 03/01/2016  . Hyperlipidemia    borderline  . Hypothyroidism, postsurgical   . Internal hemorrhoids   . Lumbar spondylosis    L4 -- S1  . Osteoporosis    osteopenia  . Overweight 08/14/2009   Qualifier: Diagnosis of  By: Niel Hummer MD, Lorinda Creed   . Palpitations 03/01/2016  . PONV (postoperative nausea and vomiting)    nausea with pain meds  . TMJ (temporomandibular joint disorder)    Past Surgical History:  Procedure Laterality Date  . APPENDECTOMY  01-30-2008  . CARPAL TUNNEL RELEASE Right 08/30/2014   Procedure: RIGHT CARPAL TUNNEL RELEASE;  Surgeon: Linna Hoff, MD;  Location: [facility];  Service: Orthopedics;  Laterality: Right;  . COLONOSCOPY  08-08-2007  . HEMORRHOID SURGERY    . LAPAROSCOPIC RIGHT OOPHORECTOMY  1996  . LAPAROSCOPIC TUBAL LIGATION  07-21-2002   W/   Dilatation and evacuation  . LEFT SHOULDER CLOSED MANIPULATION/  ARTHROSCOPIC DEBRIDEMENT LABRUM/  SAD/ RELEASE  CA LIGAMENT/ BURSECTOMY  10-12-2011  . PARTIAL KNEE ARTHROPLASTY Right 12/09/2015   Procedure: RIGHT KNEE MEDIAL UNICOMPARTMENTAL ARTHROPLASTY ;  Surgeon: Gaynelle Arabian, MD;  Location: [facility];  Service: Orthopedics;  Laterality: Right;  . TOTAL THYROIDECTOMY   05-01-2011  . TRANSOBTURATOR SLING  01-07-2005   W/ CYSTOCELE REPAIR  . UPPER GASTROINTESTINAL ENDOSCOPY  last one 06-29-2014   Social History   Socioeconomic History  . Marital status: Married    Spouse name: Not on file  . Number of children: 2  . Years of education: Not on file  . Highest education level: Not on file  Occupational History  . Occupation: Cabin crew  Social Needs  . Financial resource strain: Not on file  . Food insecurity:    Worry: Not on file    Inability: Not on file  . Transportation needs:    Medical: Not on file    Non-medical: Not on file  Tobacco Use  . Smoking status: Former Smoker    Years: 5.00    Types: Cigarettes    Start date: 09/28/1984  Last attempt to quit: 08/28/1985    Years since quitting: 33.4  . Smokeless tobacco: Never Used  Substance and Sexual Activity  . Alcohol use: Yes    Comment: rarely  . Drug use: No  . Sexual activity: Not on file  Lifestyle  . Physical activity:    Days per week: Not on file    Minutes per session: Not on file  . Stress: Not on file  Relationships  . Social connections:    Talks on phone: Not on file    Gets together: Not on file    Attends religious service: Not on file    Active member of club or organization: Not on file    Attends meetings of clubs or organizations: Not on file    Relationship status: Not on file  . Intimate partner violence:    Fear of current or ex partner: Not on file    Emotionally abused: Not on file    Physically abused: Not on file    Forced sexual activity: Not on file  Other Topics Concern  . Not on file  Social History Narrative  . Not on file   Current Outpatient Medications on File Prior to Visit  Medication Sig Dispense Refill  . calcium citrate-vitamin D (CITRACAL+D) 315-200 MG-UNIT tablet Take 1 tablet by mouth daily.    Marland Kitchen escitalopram (LEXAPRO) 10 MG tablet Take 1 tablet by mouth daily.  2  . esomeprazole (NEXIUM) 20 MG capsule TAKE 1 CAPSULE TWICE DAILY  BEFORE MEALS 60 capsule 11  . famotidine (PEPCID) 20 MG tablet Take 1 tablet (20 mg total) by mouth 2 (two) times daily as needed for heartburn or indigestion. 60 tablet 3  . hydrocortisone cream 1 % Apply 1 application topically 2 (two) times daily as needed.     [name] 500 MG CAPS Take 1 capsule (500 mg total) by mouth daily.    . montelukast (SINGULAIR) 10 MG tablet Take 1 tablet (10 mg total) by mouth daily as needed. 90 tablet 1  . promethazine (PHENERGAN) 25 MG suppository Place 1 suppository (25 mg total) rectally every 6 (six) hours as needed for nausea or vomiting. 12 each 0  . rosuvastatin (CRESTOR) 5 MG tablet Take 1 tablet (5 mg total) by mouth at bedtime. (Patient taking differently: Take 5 mg by mouth at bedtime. Takes on Tues and Saturday) 90 tablet 1  . SYNTHROID 112 MCG tablet TAKE 1 TABLET EACH DAY. 30 tablet 0  . Vitamin D, Ergocalciferol, (DRISDOL) 50000 units CAPS capsule Take 1 capsule (50,000 Units total) by mouth every 7 (seven) days. (Patient taking differently: Take 50,000 Units by mouth every 7 (seven) days. Takes prn) 12 capsule 1   Current Facility-Administered Medications on File Prior to Visit  Medication Dose Route Frequency Provider Last Rate Last Dose  . 0.9 %  sodium chloride infusion  500 mL Intravenous Once Nandigam, Venia Minks, MD       Allergies  Allergen Reactions  . Celestone [Betamethasone Sodium Phosphate] Other (See Comments)    "Feels hot and looks sunburned"  . Droperidol Other (See Comments)    "feels like I have withdrawal symptoms"  . Omni-Pac [Cefdinir] Diarrhea and Other (See Comments)    Other Reaction: Other reaction  . Statins Other (See Comments)    "joint stiffness"  . Hydrocodone Itching and Rash   Family History  Problem Relation Age of Onset  . Diabetes Father   . Cancer Father  prostate  . Stroke Father        bacterial endocarditis after prostate surgery  . Heart disease Father        aortic valvular disease, MI   . Thyroid disease Mother        hyperparathyroidism  . Alzheimer's disease Mother   . Hyperparathyroidism Mother   . Atrial fibrillation Mother   . Hypertension Mother   . Hyperlipidemia Mother   . Thyroid disease Maternal Grandmother   . Hyperlipidemia Brother   . Hypertension Brother   . Allergies Brother   . Heart disease Daughter        palpitations  . Esophageal cancer Neg Hx   . Colon cancer Neg Hx   . Colon polyps Neg Hx   . Rectal cancer Neg Hx   . Stomach cancer Neg Hx     PE: Wt 186 lb (84.4 kg)   BMI 29.13 kg/m  Wt Readings from Last 3 Encounters:  02/16/19 186 lb (84.4 kg)  11/11/18 186 lb (84.4 kg)  11/09/18 186 lb 4 oz (84.5 kg)   Constitutional: overweight, in NAD Eyes: PERRLA, EOMI, no exophthalmos ENT: moist mucous membranes, no thyromegaly, no cervical lymphadenopathy Cardiovascular: RRR, No MRG Respiratory: CTA B Gastrointestinal: abdomen soft, NT, ND, BS+ Musculoskeletal: no deformities, strength intact in all 4 Skin: moist, warm, no rashes Neurological: no tremor with outstretched hands, DTR normal in all 4  ASSESSMENT: 1. Thyroid cancer - see HPI  2. Postsurgical Hypothyroidism  3. Prediabetes  4. HL  5. Osteopenia  PLAN:  1. Follicular variant of papillary thyroid cancer  -Patient is stage I TNM, the lowest risk for papillary thyroid cancer.  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  This is a slow-growing cancer with good prognosis, so her life expectancy is unlikely to be reduced by the diagnosis.  Her cancer was encapsulated, subcentimeter, without lymphovascular invasion, which further improved her prognosis.  She had capsular invasion, though, but this still qualifies as low risk per the newest thyroid cancer guidelines. -She does not appear to have any neck compression symptoms and no masses were appreciated at palpation of her neck -We reviewed together her latest  neck ultrasound from 02/25/2018 and this did not show any suspicious masses in her neck -Latest thyroglobulin and ATA antibodies were undetectable.  We will recheck these today.  2. Patient with history of total thyroidectomy for cancer, now with iatrogenic hypothyroidism, on levothyroxine therapy - latest thyroid labs reviewed with pt >> normal at last visit - she continues on Synthroid d.a.w. 112 Mcg daily - pt feels good on this dose. - we discussed about taking the thyroid hormone every day, with water, >30 minutes before breakfast, separated by >4 hours from acid reflux medications, calcium, iron, multivitamins. Pt. is taking it correctly. - will check thyroid tests today: TSH and fT4 - If labs are abnormal, she will need to return for repeat TFTs in 1.5 months  3. Prediabetes -Excellent control, latest HbA1c from last visit was 5.1% -At today's visit, we will recheck another HbA1c level >>   4. HL - Reviewed latest lipid panel from 03/2018: Greatly improved from 2013, but with LDL still above goal Lab Results  Component Value Date   CHOL 191 04/05/2018   HDL 54.60 04/05/2018   LDLCALC 117 (H) 04/05/2018   LDLDIRECT 212.6 08/10/2012   TRIG 97.0 04/05/2018   CHOLHDL 4 04/05/2018  - Continues  Crestor 5 without side effects.  5. Osteopenia -Reviewed her latest DXA scan reports.  Latest bone density scores were much worse (10/14/2017) so I suggested Fosamax, however, she has a history of significant GERD, so oral bisphosphonates may be a problem.  At last visit we discussed about options for treatment and she was not keen to start any medication at that point.  We decided to wait for the next bone density scan, which should be checked on the same machine as the previous one, in 09/2018[facility]. -I also suggested in the past to switch from calcium carbonate to calcium citrate as this has better absorption in the setting of PPI use. -I reviewed her most recent vitamin D  level, which was normal, at 41, in 07/2018.  We will continue vitamin D 1000 units daily  - recheck level today  Needs refills LT4.   High LDL, normal vitamin D (but on the low side so I will advised her to increase the vitamin D to 2000 units daily), and slightly low TSH.  We will try to decrease the levothyroxine to 100 mcg daily and have her back for labs in 1.5 months. Component     Latest Ref Rng & Units 02/16/2019  Cholesterol     0 - 200 mg/dL 211 (H)  Triglycerides     0.0 - 149.0 mg/dL 93.0  HDL Cholesterol     >39.00 mg/dL 60.20  VLDL     0.0 - 40.0 mg/dL 18.6  LDL (calc)     0 - 99 mg/dL 133 (H)  Total CHOL/HDL Ratio      4  NonHDL      151.14  Comment        TSH     0.35 - 4.50 uIU/mL 0.26 (L)  VITD     30.00 - 100.00 ng/mL 31.72  Hemoglobin A1C     4.6 - 6.5 % 5.9  T4,Free(Direct)     0.60 - 1.60 ng/dL 1.13   Excellent Tg + ATA: Thyroglobulin     ng/mL <0.1 (L)  Thyroglobulin Ab     < or = 1 IU/mL <1   Philemon Kingdom, MD PhD [facility] End[facility]

## 2019-02-16 NOTE — Patient Instructions (Signed)
Please continue Levothyroxine 112 mcg daily.  Take the thyroid hormone every day, with water, at least 30 minutes before breakfast, separated by at least 4 hours from: - acid reflux medications - calcium - iron - multivitamins  Please stop at the lab.  Please come back for a follow-up appointment in 6 months.

## 2019-02-17 ENCOUNTER — Encounter: Payer: Self-pay | Admitting: Internal Medicine

## 2019-02-17 LAB — THYROGLOBULIN LEVEL: Thyroglobulin: <0.1 ng/mL — ABNORMAL LOW

## 2019-02-17 LAB — THYROGLOBULIN ANTIBODY: Thyroglobulin Ab: <1 IU/mL (ref <=1)

## 2019-02-17 MED ORDER — SYNTHROID 100 MCG PO TABS: 100.0000 ug | ORAL_TABLET | Freq: Every day | ORAL | 3 refills | Status: DC

## 2019-02-23 DIAGNOSIS — F4323 Adjustment disorder with mixed anxiety and depressed mood: Secondary | ICD-10-CM | POA: Diagnosis not present

## 2019-02-23 NOTE — Telephone Encounter (Signed)
Patient is calling for clarification. Was unsure to come back to see the doctor in 6 weeks or do lab work.  Please Advise, Thanks

## 2019-02-28 DIAGNOSIS — F4323 Adjustment disorder with mixed anxiety and depressed mood: Secondary | ICD-10-CM | POA: Diagnosis not present

## 2019-03-02 DIAGNOSIS — F4323 Adjustment disorder with mixed anxiety and depressed mood: Secondary | ICD-10-CM | POA: Diagnosis not present

## 2019-03-09 DIAGNOSIS — F4323 Adjustment disorder with mixed anxiety and depressed mood: Secondary | ICD-10-CM | POA: Diagnosis not present

## 2019-03-21 DIAGNOSIS — F4323 Adjustment disorder with mixed anxiety and depressed mood: Secondary | ICD-10-CM | POA: Diagnosis not present

## 2019-03-23 DIAGNOSIS — F4323 Adjustment disorder with mixed anxiety and depressed mood: Secondary | ICD-10-CM | POA: Diagnosis not present

## 2019-03-28 DIAGNOSIS — F4323 Adjustment disorder with mixed anxiety and depressed mood: Secondary | ICD-10-CM | POA: Diagnosis not present

## 2019-03-30 DIAGNOSIS — F4323 Adjustment disorder with mixed anxiety and depressed mood: Secondary | ICD-10-CM | POA: Diagnosis not present

## 2019-04-04 DIAGNOSIS — F4323 Adjustment disorder with mixed anxiety and depressed mood: Secondary | ICD-10-CM | POA: Diagnosis not present

## 2019-04-06 DIAGNOSIS — F4323 Adjustment disorder with mixed anxiety and depressed mood: Secondary | ICD-10-CM | POA: Diagnosis not present

## 2019-04-11 DIAGNOSIS — F4323 Adjustment disorder with mixed anxiety and depressed mood: Secondary | ICD-10-CM | POA: Diagnosis not present

## 2019-04-13 DIAGNOSIS — F4323 Adjustment disorder with mixed anxiety and depressed mood: Secondary | ICD-10-CM | POA: Diagnosis not present

## 2019-04-18 DIAGNOSIS — F4323 Adjustment disorder with mixed anxiety and depressed mood: Secondary | ICD-10-CM | POA: Diagnosis not present

## 2019-04-20 DIAGNOSIS — F4323 Adjustment disorder with mixed anxiety and depressed mood: Secondary | ICD-10-CM | POA: Diagnosis not present

## 2019-04-24 ENCOUNTER — Telehealth: Payer: Self-pay | Admitting: Gastroenterology

## 2019-04-24 NOTE — Telephone Encounter (Signed)
Patient is having BRBD in her stool and would like to speak to someone

## 2019-04-24 NOTE — Telephone Encounter (Signed)
Rectal bleeding with bowel movement and when cleaning. She has switched to TUCKS and she has used "old" hydrocortisone cream. Denies rectal or abdominal pain. She does feel an itching and a burning at the rectum. Please advise.

## 2019-04-25 ENCOUNTER — Other Ambulatory Visit: Payer: Self-pay

## 2019-04-25 MED ORDER — HYDROCORTISONE ACETATE 25 MG RE SUPP: 25.0000 mg | Freq: Two times a day (BID) | RECTAL | 0 refills | Status: AC

## 2019-04-25 NOTE — Telephone Encounter (Signed)
I did see some friable mucosa in the rectum on last colonoscopy in 2018, biopsies were negative for colitis.  Most likely etiology bleeding from internal hemorrhoids.  Please advise patient to use Anusol suppository twice daily X 7 days.  Schedule follow-up visit in person for rectal exam and anoscopy.  If persistent bleeding with no improvement will consider colonoscopy for further evaluation

## 2019-04-25 NOTE — Telephone Encounter (Signed)
Spoke with her. She says more blood with her bowel movement. "A little stinging with the bowel movement." She took a probiotic last night "keeping the movements soft." She asks me if I think she has anal cancer.

## 2019-04-25 NOTE — Telephone Encounter (Signed)
Pt reported that she is feeling ill and still having heavy rectal bleeding with BM.

## 2019-04-25 NOTE — Telephone Encounter (Signed)
Patient advised. Appointment 04/27/19 Rx to [facility]

## 2019-04-26 ENCOUNTER — Telehealth: Payer: Self-pay | Admitting: *Deleted

## 2019-04-26 NOTE — Telephone Encounter (Signed)
Covid-19 screening questions   Do you now or have you had a fever in the last 14 days? No  Do you have any respiratory symptoms of shortness of breath or cough now or in the last 14 days? No  Do you have any family members or close contacts with diagnosed or suspected Covid-19 in the past 14 days? No  Have you been tested for Covid-19 and found to be positive? No        

## 2019-04-27 ENCOUNTER — Ambulatory Visit: Payer: BLUE CROSS/BLUE SHIELD | Admitting: Gastroenterology

## 2019-04-27 ENCOUNTER — Encounter: Payer: Self-pay | Admitting: Gastroenterology

## 2019-04-27 VITALS — BP 106/72 | HR 84 | Temp 97.2°F | Resp 84 | Ht 67.0 in | Wt 180.5 lb

## 2019-04-27 DIAGNOSIS — K648 Other hemorrhoids: Secondary | ICD-10-CM

## 2019-04-27 DIAGNOSIS — K602 Anal fissure, unspecified: Secondary | ICD-10-CM

## 2019-04-27 DIAGNOSIS — K625 Hemorrhage of anus and rectum: Secondary | ICD-10-CM | POA: Diagnosis not present

## 2019-04-27 DIAGNOSIS — F4323 Adjustment disorder with mixed anxiety and depressed mood: Secondary | ICD-10-CM | POA: Diagnosis not present

## 2019-04-27 MED ORDER — AMBULATORY NON FORMULARY MEDICATION: 1 refills | Status: DC

## 2019-04-27 NOTE — Progress Notes (Signed)
[name]    [id number]    [birth year]  Primary Care Physician:Blyth, Bonnita Levan, MD  Referring Physician: Mosie Lukes, MD [address] [place],  [place] [address]   Chief complaint: Rectal bleeding  HPI:  63 year old female with history of chronic GERD, short segment of Barrett's esophagus, symptomatic hemorrhoids status post hemorrhoidal band ligation X1 May 2017 with complaints of bright red blood per rectum.  She woke up Saturday morning with blood in her underwear, the evening prior to it she was straining while lifting a heavy cabinet from the floor.  Since she started using Anusol suppository has not had any bleeding in the last 2 days.  Denies constipation.  No abdominal pain or melena.  Colonoscopy June 29, 2017 left-sided diverticulosis, mild erythema, superficial ulcerated mucosa in distal rectum biopsied, showed normal mucosa with no evidence of inflammation or proctitis.  Small nonbleeding internal hemorrhoids  EGD November 11, 2018: 5 cm hiatal hernia, irregular Z line with less than 1 cm mucosal changes suggestive of Barrett's esophagus Gastric fundic polyps otherwise unremarkable exam  Outpatient Encounter Medications as of 04/27/2019  Medication Sig   calcium citrate-vitamin D (CITRACAL+D) 315-200 MG-UNIT tablet Take 1 tablet by mouth daily.   escitalopram (LEXAPRO) 10 MG tablet Take 1 tablet by mouth daily.   esomeprazole (NEXIUM) 20 MG capsule TAKE 1 CAPSULE TWICE DAILY BEFORE MEALS   famotidine (PEPCID) 20 MG tablet Take 1 tablet (20 mg total) by mouth 2 (two) times daily as needed for heartburn or indigestion.   hydrocortisone (ANUSOL-HC) 25 MG suppository Place 1 suppository (25 mg total) rectally every 12 (twelve) hours for 7 days.   hydrocortisone cream 1 % Apply 1 application topically 2 (two) times daily as needed.    Krill Oil 500 MG CAPS Take 1 capsule (500 mg total) by mouth daily.   montelukast (SINGULAIR)  10 MG tablet Take 1 tablet (10 mg total) by mouth daily as needed.   promethazine (PHENERGAN) 25 MG suppository Place 1 suppository (25 mg total) rectally every 6 (six) hours as needed for nausea or vomiting.   rosuvastatin (CRESTOR) 5 MG tablet Take 1 tablet (5 mg total) by mouth at bedtime. (Patient taking differently: Take 5 mg by mouth at bedtime. Takes on Tues and Saturday)   SYNTHROID 100 MCG tablet Take 1 tablet (100 mcg total) by mouth daily before breakfast.   Vitamin D, Ergocalciferol, (DRISDOL) 50000 units CAPS capsule Take 1 capsule (50,000 Units total) by mouth every 7 (seven) days. (Patient taking differently: Take 50,000 Units by mouth every 7 (seven) days. Takes prn)   Facility-Administered Encounter Medications as of 04/27/2019  Medication   0.9 %  sodium chloride infusion    Allergies as of 04/27/2019 - Review Complete 02/17/2019  Allergen Reaction Noted   Celestone [betamethasone sodium phosphate] Other (See Comments) 10/12/2011   Droperidol Other (See Comments) 10/12/2011   Omni-pac [cefdinir] Diarrhea and Other (See Comments) 08/28/2014   Statins Other (See Comments) 08/28/2014   Hydrocodone Itching and Rash 11/22/2015    Past Medical History:  Diagnosis Date   Acute upper respiratory infection 09/13/2016   Allergic rhinitis    Allergy    Anxiety state 08/24/2016   Barrett's esophagus without dysplasia    pt says was suspected never confirmed 9 or 10 years ago   Cancer ([place])    thyroid cancer   Depression    Esophagitis    Fundic  gland polyps of stomach, benign    GERD (gastroesophageal reflux disease)    Hemorrhoids    History of hiatal hernia    History of ovarian cyst    History of thyroid cancer    05-01-2011  s/p  total thyroidectomy for multinodular goiter and papillary carcinoma   Hyperglycemia 03/01/2016   Hyperlipidemia    borderline   Hypothyroidism, postsurgical    Internal hemorrhoids    Lumbar spondylosis    L4  -- S1   Osteoporosis    osteopenia   Overweight 08/14/2009   Qualifier: Diagnosis of  By: Niel Hummer MD, Willie R    Palpitations 03/01/2016   PONV (postoperative nausea and vomiting)    nausea with pain meds   TMJ (temporomandibular joint disorder)     Past Surgical History:  Procedure Laterality Date   APPENDECTOMY  01-30-2008   CARPAL TUNNEL RELEASE Right 08/30/2014   Procedure: RIGHT CARPAL TUNNEL RELEASE;  Surgeon: Linna Hoff, MD;  Location: [facility];  Service: Orthopedics;  Laterality: Right;   COLONOSCOPY  08-08-2007   HEMORRHOID SURGERY     LAPAROSCOPIC RIGHT OOPHORECTOMY  1996   LAPAROSCOPIC TUBAL LIGATION  07-21-2002   W/   Dilatation and evacuation   LEFT SHOULDER CLOSED MANIPULATION/  ARTHROSCOPIC DEBRIDEMENT LABRUM/  SAD/ RELEASE  CA LIGAMENT/ BURSECTOMY  10-12-2011   PARTIAL KNEE ARTHROPLASTY Right 12/09/2015   Procedure: RIGHT KNEE MEDIAL UNICOMPARTMENTAL ARTHROPLASTY ;  Surgeon: Gaynelle Arabian, MD;  Location: [facility];  Service: Orthopedics;  Laterality: Right;   TOTAL THYROIDECTOMY  05-01-2011   TRANSOBTURATOR SLING  01-07-2005   W/ CYSTOCELE REPAIR   UPPER GASTROINTESTINAL ENDOSCOPY  last one 06-29-2014    Family History  Problem Relation Age of Onset   Diabetes Father    Cancer Father        prostate   Stroke Father        bacterial endocarditis after prostate surgery   Heart disease Father        aortic valvular disease, MI   Thyroid disease Mother        hyperparathyroidism   Alzheimer's disease Mother    Hyperparathyroidism Mother    Atrial fibrillation Mother    Hypertension Mother    Hyperlipidemia Mother    Thyroid disease Maternal Grandmother    Hyperlipidemia Brother    Hypertension Brother    Allergies Brother    Heart disease Daughter        palpitations   Esophageal cancer Neg Hx    Colon cancer Neg Hx    Colon polyps Neg Hx    Rectal cancer Neg Hx    Stomach cancer Neg Hx      Social History   Socioeconomic History   Marital status: Married    Spouse name: Not on file   Number of children: 2   Years of education: Not on file   Highest education level: Not on file  Occupational History   Occupation: Biomedical engineer strain: Not on file   Food insecurity    Worry: Not on file    Inability: Not on file   Transportation needs    Medical: Not on file    Non-medical: Not on file  Tobacco Use   Smoking status: Former Smoker    Years: 5.00    Types: Cigarettes    Start date: 09/28/1984    Quit date: 08/28/1985    Years since quitting: 33.6   Smokeless tobacco:  Never Used  Substance and Sexual Activity   Alcohol use: Yes    Comment: rarely   Drug use: No   Sexual activity: Not on file  Lifestyle   Physical activity    Days per week: Not on file    Minutes per session: Not on file   Stress: Not on file  Relationships   Social connections    Talks on phone: Not on file    Gets together: Not on file    Attends religious service: Not on file    Active member of club or organization: Not on file    Attends meetings of clubs or organizations: Not on file    Relationship status: Not on file   Intimate partner violence    Fear of current or ex partner: Not on file    Emotionally abused: Not on file    Physically abused: Not on file    Forced sexual activity: Not on file  Other Topics Concern   Not on file  Social History Narrative   Not on file      Review of systems: Review of Systems  Constitutional: Negative for fever and chills.  HENT: Negative.   Eyes: Negative for blurred vision.  Respiratory: Negative for cough, shortness of breath and wheezing.   Cardiovascular: Negative for chest pain and palpitations.  Gastrointestinal: as per HPI Genitourinary: Negative for dysuria, urgency, frequency and hematuria.  Musculoskeletal: Negative for myalgias, back pain and joint pain.  Skin: Negative  for itching and rash.  Neurological: Negative for dizziness, tremors, focal weakness, seizures and loss of consciousness.  Endo/Heme/Allergies: Positive for seasonal allergies.  Psychiatric/Behavioral: Negative for depression, suicidal ideas and hallucinations.  All other systems reviewed and are negative.   Physical Exam: Vitals:   04/27/19 0910  BP: 106/72  Pulse: 84  Resp: (!) 84  Temp: (!) 97.2 F (36.2 C)  SpO2: 95%   Body mass index is 28.27 kg/m. Gen:      No acute distress HEENT:  EOMI, sclera anicteric Neck:     No masses; no thyromegaly Lungs:    Clear to auscultation bilaterally; normal respiratory effort CV:         Regular rate and rhythm; no murmurs Abd:      + bowel sounds; soft, non-tender; no palpable masses, no distension Ext:    No edema; adequate peripheral perfusion Skin:      Warm and dry; no rash Neuro: alert and oriented x 3 Psych: normal mood and affect Rectal exam: Increased anal sphincter tone with tenderness, no external hemorrhoids, anal fissure+ Anoscopy: Anterior anal fissure, small internal hemorrhoids, no active bleeding, normal dentate line, no visible nodules  Data Reviewed:  Reviewed labs, radiology imaging, old records and pertinent past GI work up   Assessment and Plan/Recommendations:  63 year old female with history of chronic GERD and internal hemorrhoids with complaints of bright red blood per rectum, anal fissure on exam  Rectal nitroglycerin 0.125% 3 times daily, small pea-sized amount for 6 to 8 weeks Benefiber 1 teaspoon 3 times daily with meals  Avoid excessive straining with defecation  Follow-up in 2 months  25 minutes was spent face-to-face with the patient. Greater than 50% of the time used for counseling as well as treatment plan and follow-up. She had multiple questions which were answered to her satisfaction  K. Denzil Magnuson , MD    CC: Mosie Lukes, MD

## 2019-04-27 NOTE — Patient Instructions (Addendum)
We have sent a prescription for nitroglycerin 0.125% gel to [facility]. You should apply a pea size amount to your rectum three times daily x 6-8 weeks.  [facility]'s information is below: Address: [address], [place], [place] [address]  [phone]  *Please DO NOT go directly from our office to pick up this medication! Give the pharmacy 1 day to process the prescription as this is compounded at takes time to make.  Take benefiber 1 teaspoon twice a day   Follow up in 2 months   I appreciate the  opportunity to care for you  Thank You   Harl Bowie , MD

## 2019-04-28 DIAGNOSIS — F4323 Adjustment disorder with mixed anxiety and depressed mood: Secondary | ICD-10-CM | POA: Diagnosis not present

## 2019-06-27 ENCOUNTER — Telehealth: Payer: Self-pay | Admitting: Gastroenterology

## 2019-06-27 NOTE — Telephone Encounter (Signed)
Pt reported that the treatment Dr. Silverio Decamp had prescribed has worked very well.  Pt questioned whether she still needs appt.

## 2019-06-28 NOTE — Telephone Encounter (Signed)
I wanted to check to make sure anal fissure was healing but if she is feeling much better okay to reschedule appointment.

## 2019-06-28 NOTE — Telephone Encounter (Signed)
Patient contacted. She states she feels the fissure has healed. She has no pain or blood with her bowel movements. Appointment cancelled.

## 2019-06-28 NOTE — Telephone Encounter (Signed)
ok 

## 2019-06-29 ENCOUNTER — Ambulatory Visit: Payer: Self-pay | Admitting: Gastroenterology

## 2019-07-26 ENCOUNTER — Other Ambulatory Visit: Payer: Self-pay | Admitting: Internal Medicine

## 2019-08-01 ENCOUNTER — Other Ambulatory Visit: Payer: Self-pay

## 2019-08-01 ENCOUNTER — Ambulatory Visit (INDEPENDENT_AMBULATORY_CARE_PROVIDER_SITE_OTHER): Payer: Managed Care, Other (non HMO) | Admitting: Family Medicine

## 2019-08-01 DIAGNOSIS — H6982 Other specified disorders of Eustachian tube, left ear: Secondary | ICD-10-CM | POA: Diagnosis not present

## 2019-08-01 DIAGNOSIS — H6980 Other specified disorders of Eustachian tube, unspecified ear: Secondary | ICD-10-CM | POA: Insufficient documentation

## 2019-08-01 DIAGNOSIS — J329 Chronic sinusitis, unspecified: Secondary | ICD-10-CM

## 2019-08-01 MED ORDER — AMOXICILLIN 500 MG PO CAPS: 500.0000 mg | ORAL_CAPSULE | Freq: Three times a day (TID) | ORAL | 0 refills | Status: DC

## 2019-08-01 NOTE — Progress Notes (Signed)
Virtual Visit via Video Note  I connected with [name] on 08/01/19 at  3:20 PM EST by a video enabled telemedicine application and verified that I am speaking with the correct person using two identifiers.  Location: Patient: home Provider: office   I discussed the limitations of evaluation and management by telemedicine and the availability of in person appointments. The patient expressed understanding and agreed to proceed. Magdalene Molly, CMA was able to get the patient set up on a video visit   Subjective:    Patient ID: [name], female    DOB: 1956/04/11, 63 y.o.   MRN: [id number]  No chief complaint on file.   HPI Patient is in today for evaluation of ear and sinus symptoms. She flew to [place] to visit her children last Thursday and she started having left ear pain in first 24 hours. Pain is some better today after starting Sudafed and Flonase. No fevers or chills. No rhinorrhea. Denies CP/palp/SOB/fevers/GI or GU c/o. Taking meds as prescribed  Past Medical History:  Diagnosis Date  . Acute upper respiratory infection 09/13/2016  . Allergic rhinitis   . Allergy   . Anxiety state 08/24/2016  . Barrett's esophagus without dysplasia    pt says was suspected never confirmed 9 or 10 years ago  . Cancer [facility])    thyroid cancer  . Depression   . Esophagitis   . Fundic gland polyps of stomach, benign   . GERD (gastroesophageal reflux disease)   . Hemorrhoids   . History of hiatal hernia   . History of ovarian cyst   . History of thyroid cancer    05-01-2011  s/p  total thyroidectomy for multinodular goiter and papillary carcinoma  . Hyperglycemia 03/01/2016  . Hyperlipidemia    borderline  . Hypothyroidism, postsurgical   . Internal hemorrhoids   . Lumbar spondylosis    L4 -- S1  . Osteoporosis    osteopenia  . Overweight 08/14/2009   Qualifier: Diagnosis of  By: Niel Hummer MD, Lorinda Creed   . Palpitations 03/01/2016  . PONV (postoperative  nausea and vomiting)    nausea with pain meds  . TMJ (temporomandibular joint disorder)     Past Surgical History:  Procedure Laterality Date  . APPENDECTOMY  01-30-2008  . CARPAL TUNNEL RELEASE Right 08/30/2014   Procedure: RIGHT CARPAL TUNNEL RELEASE;  Surgeon: Linna Hoff, MD;  Location: [facility];  Service: Orthopedics;  Laterality: Right;  . COLONOSCOPY  08-08-2007  . HEMORRHOID SURGERY    . LAPAROSCOPIC RIGHT OOPHORECTOMY  1996  . LAPAROSCOPIC TUBAL LIGATION  07-21-2002   W/   Dilatation and evacuation  . LEFT SHOULDER CLOSED MANIPULATION/  ARTHROSCOPIC DEBRIDEMENT LABRUM/  SAD/ RELEASE  CA LIGAMENT/ BURSECTOMY  10-12-2011  . PARTIAL KNEE ARTHROPLASTY Right 12/09/2015   Procedure: RIGHT KNEE MEDIAL UNICOMPARTMENTAL ARTHROPLASTY ;  Surgeon: Gaynelle Arabian, MD;  Location: [facility];  Service: Orthopedics;  Laterality: Right;  . TOTAL THYROIDECTOMY  05-01-2011  . TRANSOBTURATOR SLING  01-07-2005   W/ CYSTOCELE REPAIR  . UPPER GASTROINTESTINAL ENDOSCOPY  last one 06-29-2014    Family History  Problem Relation Age of Onset  . Diabetes Father   . Cancer Father        prostate  . Stroke Father        bacterial endocarditis after prostate surgery  . Heart disease Father        aortic valvular disease, MI  . Thyroid disease Mother  hyperparathyroidism  . Alzheimer's disease Mother   . Hyperparathyroidism Mother   . Atrial fibrillation Mother   . Hypertension Mother   . Hyperlipidemia Mother   . Thyroid disease Maternal Grandmother   . Hyperlipidemia Brother   . Hypertension Brother   . Allergies Brother   . Heart disease Daughter        palpitations  . Esophageal cancer Neg Hx   . Colon cancer Neg Hx   . Colon polyps Neg Hx   . Rectal cancer Neg Hx   . Stomach cancer Neg Hx     Social History   Socioeconomic History  . Marital status: Married    Spouse name: Not on file  . Number of children: 2  . Years of education: Not on file  . Highest  education level: Not on file  Occupational History  . Occupation: Cabin crew  Social Needs  . Financial resource strain: Not on file  . Food insecurity    Worry: Not on file    Inability: Not on file  . Transportation needs    Medical: Not on file    Non-medical: Not on file  Tobacco Use  . Smoking status: Former Smoker    Years: 5.00    Types: Cigarettes    Start date: 09/28/1984    Quit date: 08/28/1985    Years since quitting: 33.9  . Smokeless tobacco: Never Used  Substance and Sexual Activity  . Alcohol use: Yes    Comment: rarely  . Drug use: No  . Sexual activity: Not on file  Lifestyle  . Physical activity    Days per week: Not on file    Minutes per session: Not on file  . Stress: Not on file  Relationships  . Social Herbalist on phone: Not on file    Gets together: Not on file    Attends religious service: Not on file    Active member of club or organization: Not on file    Attends meetings of clubs or organizations: Not on file    Relationship status: Not on file  . Intimate partner violence    Fear of current or ex partner: Not on file    Emotionally abused: Not on file    Physically abused: Not on file    Forced sexual activity: Not on file  Other Topics Concern  . Not on file  Social History Narrative  . Not on file    Outpatient Medications Prior to Visit  Medication Sig Dispense Refill  . AMBULATORY NON FORMULARY MEDICATION Medication Name: Use Pea sized amount rectally , Nitroglycerin ointment 0.125% three times a day for 6-8 weeks 30 g 1  . calcium citrate-vitamin D (CITRACAL+D) 315-200 MG-UNIT tablet Take 1 tablet by mouth daily.    Marland Kitchen escitalopram (LEXAPRO) 10 MG tablet Take 1 tablet by mouth daily.  2  . esomeprazole (NEXIUM) 20 MG capsule TAKE 1 CAPSULE TWICE DAILY BEFORE MEALS 60 capsule 11  . famotidine (PEPCID) 20 MG tablet Take 1 tablet (20 mg total) by mouth 2 (two) times daily as needed for heartburn or indigestion. (Patient not  taking: Reported on 04/27/2019) 60 tablet 3  . hydrocortisone cream 1 % Apply 1 application topically 2 (two) times daily as needed.     [name] 500 MG CAPS Take 1 capsule (500 mg total) by mouth daily.    . montelukast (SINGULAIR) 10 MG tablet Take 1 tablet (10 mg total) by mouth daily as  needed. (Patient not taking: Reported on 04/27/2019) 90 tablet 1  . promethazine (PHENERGAN) 25 MG suppository Place 1 suppository (25 mg total) rectally every 6 (six) hours as needed for nausea or vomiting. (Patient not taking: Reported on 04/27/2019) 12 each 0  . rosuvastatin (CRESTOR) 5 MG tablet Take 1 tablet (5 mg total) by mouth at bedtime. (Patient taking differently: Take 5 mg by mouth at bedtime. Takes on Tues and Saturday) 90 tablet 1  . SYNTHROID 100 MCG tablet TAKE 1 TABLET ONCE DAILY BEFORE BREAKFAST. 34 tablet 0  . Vitamin D, Ergocalciferol, (DRISDOL) 50000 units CAPS capsule Take 1 capsule (50,000 Units total) by mouth every 7 (seven) days. (Patient not taking: Reported on 04/27/2019) 12 capsule 1   Facility-Administered Medications Prior to Visit  Medication Dose Route Frequency Provider Last Rate Last Dose  . 0.9 %  sodium chloride infusion  500 mL Intravenous Once Nandigam, Venia Minks, MD        Allergies  Allergen Reactions  . Celestone [Betamethasone Sodium Phosphate] Other (See Comments)    "Feels hot and looks sunburned"  . Droperidol Other (See Comments)    "feels like I have withdrawal symptoms"  . Omni-Pac [Cefdinir] Diarrhea and Other (See Comments)    Other Reaction: Other reaction  . Statins Other (See Comments)    "joint stiffness"  . Hydrocodone Itching and Rash    Review of Systems  Constitutional: Positive for malaise/fatigue. Negative for fever.  HENT: Positive for congestion, ear pain and sinus pain. Negative for ear discharge and tinnitus.   Eyes: Negative for blurred vision.  Respiratory: Negative for cough and shortness of breath.   Cardiovascular: Negative for  chest pain, palpitations and leg swelling.  Gastrointestinal: Negative for abdominal pain, blood in stool and nausea.  Genitourinary: Negative for dysuria and frequency.  Musculoskeletal: Negative for falls.  Skin: Negative for rash.  Neurological: Negative for dizziness, loss of consciousness and headaches.  Endo/Heme/Allergies: Negative for environmental allergies.  Psychiatric/Behavioral: Negative for depression. The patient is not nervous/anxious.        Objective:    Physical Exam Constitutional:      Appearance: Normal appearance. She is not ill-appearing.  HENT:     Head: Normocephalic and atraumatic.     Nose: Nose normal.  Pulmonary:     Effort: Pulmonary effort is normal.  Neurological:     Mental Status: She is alert and oriented to person, place, and time.  Psychiatric:        Behavior: Behavior normal.     There were no vitals taken for this visit. Wt Readings from Last 3 Encounters:  04/27/19 180 lb 8 oz (81.9 kg)  02/16/19 186 lb (84.4 kg)  11/11/18 186 lb (84.4 kg)    Diabetic Foot Exam - Simple   No data filed     Lab Results  Component Value Date   WBC 4.6 04/05/2018   HGB 13.5 04/05/2018   HCT 39.9 04/05/2018   PLT 230.0 04/05/2018   GLUCOSE 95 04/05/2018   CHOL 211 (H) 02/16/2019   TRIG 93.0 02/16/2019   HDL 60.20 02/16/2019   LDLDIRECT 212.6 08/10/2012   LDLCALC 133 (H) 02/16/2019   ALT 12 04/05/2018   AST 15 04/05/2018   NA 143 04/05/2018   K 4.2 04/05/2018   CL 105 04/05/2018   CREATININE 0.65 04/05/2018   BUN 12 04/05/2018   CO2 30 04/05/2018   TSH 0.26 (L) 02/16/2019   INR 1.10 12/03/2015   HGBA1C 5.9 02/16/2019  Lab Results  Component Value Date   TSH 0.26 (L) 02/16/2019   Lab Results  Component Value Date   WBC 4.6 04/05/2018   HGB 13.5 04/05/2018   HCT 39.9 04/05/2018   MCV 89.6 04/05/2018   PLT 230.0 04/05/2018   Lab Results  Component Value Date   NA 143 04/05/2018   K 4.2 04/05/2018   CO2 30 04/05/2018    GLUCOSE 95 04/05/2018   BUN 12 04/05/2018   CREATININE 0.65 04/05/2018   BILITOT 0.5 04/05/2018   ALKPHOS 83 04/05/2018   AST 15 04/05/2018   ALT 12 04/05/2018   PROT 6.7 04/05/2018   ALBUMIN 4.5 04/05/2018   CALCIUM 8.9 04/05/2018   ANIONGAP 8 12/11/2015   GFR 98.55 04/05/2018   Lab Results  Component Value Date   CHOL 211 (H) 02/16/2019   Lab Results  Component Value Date   HDL 60.20 02/16/2019   Lab Results  Component Value Date   LDLCALC 133 (H) 02/16/2019   Lab Results  Component Value Date   TRIG 93.0 02/16/2019   Lab Results  Component Value Date   CHOLHDL 4 02/16/2019   Lab Results  Component Value Date   HGBA1C 5.9 02/16/2019       Assessment & Plan:   Problem List Items Addressed This Visit    ETD (eustachian tube dysfunction)    She flew to [place] to visit her children last Thursday and she started having left ear pain in first 24 hours. Pain is some better today after starting Sudafed and Flonase. She is encouraged to add nasal saline bid with valsalva maneuver and she is encouraged to also add Claritin or Zyrtec and Afrin when she flies.       Sinusitis    Possible sinus infection but feeling some better today. Encouraged increased rest and hydration, add probiotics, zinc such as Coldeze or Xicam. Treat fevers as needed. Given a prescription for Amoxicillin to take if symptoms worsen. Start a probiotic.       Relevant Medications   amoxicillin (AMOXIL) 500 MG capsule      I am having [name] "[name]" start on amoxicillin. I am also having her maintain her hydrocortisone cream, promethazine, Krill Oil, Vitamin D (Ergocalciferol), escitalopram, montelukast, famotidine, rosuvastatin, calcium citrate-vitamin D, esomeprazole, AMBULATORY NON FORMULARY MEDICATION, and Synthroid. We will continue to administer sodium chloride.  Meds ordered this encounter  Medications  . amoxicillin (AMOXIL) 500 MG capsule    Sig: Take 1 capsule (500 mg  total) by mouth 3 (three) times daily.    Dispense:  30 capsule    Refill:  0     I discussed the assessment and treatment plan with the patient. The patient was provided an opportunity to ask questions and all were answered. The patient agreed with the plan and demonstrated an understanding of the instructions.   The patient was advised to call back or seek an in-person evaluation if the symptoms worsen or if the condition fails to improve as anticipated.  I provided 15 minutes of non-face-to-face time during this encounter.   Penni Homans, MD

## 2019-08-01 NOTE — Assessment & Plan Note (Signed)
She flew to [place] to visit her children last Thursday and she started having left ear pain in first 24 hours. Pain is some better today after starting Sudafed and Flonase. She is encouraged to add nasal saline bid with valsalva maneuver and she is encouraged to also add Claritin or Zyrtec and Afrin when she flies.

## 2019-08-01 NOTE — Assessment & Plan Note (Signed)
Possible sinus infection but feeling some better today. Encouraged increased rest and hydration, add probiotics, zinc such as Coldeze or Xicam. Treat fevers as needed. Given a prescription for Amoxicillin to take if symptoms worsen. Start a probiotic.

## 2019-08-14 ENCOUNTER — Other Ambulatory Visit: Payer: Self-pay | Admitting: Obstetrics and Gynecology

## 2019-08-14 DIAGNOSIS — Z1231 Encounter for screening mammogram for malignant neoplasm of breast: Secondary | ICD-10-CM

## 2019-08-15 ENCOUNTER — Other Ambulatory Visit: Payer: Self-pay

## 2019-08-17 ENCOUNTER — Encounter: Payer: Self-pay | Admitting: Internal Medicine

## 2019-08-17 ENCOUNTER — Other Ambulatory Visit: Payer: Self-pay

## 2019-08-17 ENCOUNTER — Ambulatory Visit: Payer: Managed Care, Other (non HMO) | Admitting: Internal Medicine

## 2019-08-17 VITALS — BP 120/70 | HR 70 | Ht 67.0 in | Wt 183.0 lb

## 2019-08-17 DIAGNOSIS — M8589 Other specified disorders of bone density and structure, multiple sites: Secondary | ICD-10-CM | POA: Diagnosis not present

## 2019-08-17 DIAGNOSIS — C73 Malignant neoplasm of thyroid gland: Secondary | ICD-10-CM

## 2019-08-17 DIAGNOSIS — E890 Postprocedural hypothyroidism: Secondary | ICD-10-CM | POA: Diagnosis not present

## 2019-08-17 DIAGNOSIS — R7303 Prediabetes: Secondary | ICD-10-CM | POA: Diagnosis not present

## 2019-08-17 DIAGNOSIS — E782 Mixed hyperlipidemia: Secondary | ICD-10-CM

## 2019-08-17 LAB — POCT GLYCOSYLATED HEMOGLOBIN (HGB A1C): Hemoglobin A1C: 5.3 % (ref 4.0–5.6)

## 2019-08-17 LAB — TSH: TSH: 0.37 u[IU]/mL (ref 0.35–4.50)

## 2019-08-17 LAB — VITAMIN D 25 HYDROXY (VIT D DEFICIENCY, FRACTURES): VITD: 33.96 ng/mL (ref 30.00–100.00)

## 2019-08-17 LAB — T4, FREE: Free T4: 1.1 ng/dL (ref 0.60–1.60)

## 2019-08-17 NOTE — Progress Notes (Signed)
Patient ID: [name], female   DOB: [birth year], 63 y.o.   MRN: [id number]   HPI  [name] is a 63 y.o.-year-old female, presenting for follow-up for follicular variant of papillary thyroid cancer and postsurgical hypothyroidism.  Last visit 6 months ago. She moved to [place]  from [place], where she was followed by endocrinology (reviewed notes by Dr. Virgina Evener). Prev. Seen by Dr. Forde Dandy and then Saucier in town.  Thyroid cancer: -Diagnosed in 2012  Reviewed and cancer history:   04/30/2017: total thyroidectomy - Dr Harlow Asa:     - Hurthle cell changes  2012: RAI Tx: 100 mci I131  02/06/2014: Neck ultrasound: negative for metastasis per review of previous endocrinologist notes.  However, there were 2 small rounded lymph nodes in the left neck, measuring 5.1 x 2.5 mm and 3.8 x 3.6 mm.  They did not have visible hilar lines.  02/25/2018: Neck ultrasound: No residual thyroid tissue or enlarged lymph nodes  Thyroglobulin levels reviewed: Lab Results  Component Value Date   THYROGLB <0.1 (L) 02/16/2019   THYROGLB <0.1 (L) 02/11/2018   THGAB <1 02/16/2019   THGAB <1 02/11/2018  01/23/2016: Tg <0.1 (IMA), ATA <1.0, TSH 0.186 02/06/2014: Tg <0.1, ATA <1.0, TSH 0.707 09/01/2011: Tg <0.5, ATA <20, TSH 0.140  Postsurgical hypothyroidism:  She is on Synthroid 100 mcg daily, decreased from 112 mcg daily 6 months ago: - in am - fasting - at least 30 min from b'fast - + Ca, PPIs (Nexium) later in the day - no Fe, MVI - not on Biotin  She did not return for another set of labs after we decrease the dose.  Reviewed her TFTs: Lab Results  Component Value Date   TSH 0.26 (L) 02/16/2019   TSH 0.58 01/10/2018   TSH 0.76 08/07/2016   TSH 2.59 03/23/2016   TSH 1.15 09/03/2015   TSH 1.15 07/16/2014   FREET4 1.13 02/16/2019   FREET4 0.87 01/10/2018    Pt denies: - feeling nodules in neck - hoarseness - dysphagia - choking - SOB with lying down  He has a  history of esophageal dilations x2.  She has + FH of thyroid disorders in: MGM (goiter); parathyroid ds. In M. No FH of thyroid cancer. No h/o radiation tx to head or neck.  No herbal supplements. No Biotin use. No recent steroids use.   Prediabetes:  HbA1c levels reviewed: Lab Results  Component Value Date   HGBA1C 5.9 02/16/2019   HGBA1C 5.1 08/15/2018   HGBA1C 6.0 04/05/2018   HGBA1C 5.9 11/30/2017   HGBA1C 6.0 01/29/2017   HGBA1C 5.8 08/07/2016   HGBA1C 5.9 02/21/2016   HGBA1C 5.9 09/03/2015   Father with DM2.  Osteopenia.   She was previously on Evista but she could not tolerate it due to headaches. I did suggest Fosamax in the past but she did not want to start any medications at that time.  She does have a significant history of GERD.  [facility]): L1-L4: -2.0 RFN: -1.9 LFN: -1.2 This was worse compared to DXA scan report from 08/15/2014: L1-L4: -1.6 RFN: -0.7 LFN: -1.2  Hyperlipidemia: Lipid Panel :    Component Value Date/Time   CHOL 211 (H) 02/16/2019 1007   TRIG 93.0 02/16/2019 1007   HDL 60.20 02/16/2019 1007   CHOLHDL 4 02/16/2019 1007   VLDL 18.6 02/16/2019 1007   LDLCALC 133 (H) 02/16/2019 1007   LDLDIRECT 212.6 08/10/2012 1012  She continues on Crestor 5  twice a week and also MegaRed..  She was previously on 50,000 units vitamin D weekly, but not consistent with this.  At last visit, I advised her to increase the dose to 2000 units daily >> did not do this - continues on 1000 units daily.  She is also on Citracal plus D.  Lab Results  Component Value Date   VD25OH 31.72 02/16/2019   VD25OH 42.10 08/15/2018   VD25OH 34.66 04/05/2018   VD25OH 51.27 01/29/2017   VD25OH 36.00 11/05/2016   VD25OH 25.11 (L) 08/07/2016   VD25OH 39.41 07/16/2014   She is walking daily for exercise.  She separated from her husband for 30 years before last visit. He has Renal cell CA and now has ESRD.  ROS: Constitutional: no  weight gain/no weight loss, no fatigue, no subjective hyperthermia, no subjective hypothermia Eyes: no blurry vision, no xerophthalmia ENT: no sore throat, + see HPI Cardiovascular: no CP/no SOB/no palpitations/no leg swelling Respiratory: no cough/no SOB/no wheezing Gastrointestinal: no N/no V/no D/no C/no acid reflux Musculoskeletal: no muscle aches/no joint aches Skin: no rashes, no hair loss Neurological: no tremors/no numbness/no tingling/no dizziness  I reviewed pt's medications, allergies, PMH, social hx, family hx, and changes were documented in the history of present illness. Otherwise, unchanged from my initial visit note.  Past Medical History:  Diagnosis Date   Acute upper respiratory infection 09/13/2016   Allergic rhinitis    Allergy    Anxiety state 08/24/2016   Barrett's esophagus without dysplasia    pt says was suspected never confirmed 9 or 10 years ago   Cancer [facility])    thyroid cancer   Depression    Esophagitis    Fundic gland polyps of stomach, benign    GERD (gastroesophageal reflux disease)    Hemorrhoids    History of hiatal hernia    History of ovarian cyst    History of thyroid cancer    05-01-2011  s/p  total thyroidectomy for multinodular goiter and papillary carcinoma   Hyperglycemia 03/01/2016   Hyperlipidemia    borderline   Hypothyroidism, postsurgical    Internal hemorrhoids    Lumbar spondylosis    L4 -- S1   Osteoporosis    osteopenia   Overweight 08/14/2009   Qualifier: Diagnosis of  By: Niel Hummer MD, Willie R    Palpitations 03/01/2016   PONV (postoperative nausea and vomiting)    nausea with pain meds   TMJ (temporomandibular joint disorder)    Past Surgical History:  Procedure Laterality Date   APPENDECTOMY  01-30-2008   CARPAL TUNNEL RELEASE Right 08/30/2014   Procedure: RIGHT CARPAL TUNNEL RELEASE;  Surgeon: Linna Hoff, MD;  Location: [facility];  Service: Orthopedics;   Laterality: Right;   COLONOSCOPY  08-08-2007   HEMORRHOID SURGERY     LAPAROSCOPIC RIGHT OOPHORECTOMY  1996   LAPAROSCOPIC TUBAL LIGATION  07-21-2002   W/   Dilatation and evacuation   LEFT SHOULDER CLOSED MANIPULATION/  ARTHROSCOPIC DEBRIDEMENT LABRUM/  SAD/ RELEASE  CA LIGAMENT/ BURSECTOMY  10-12-2011   PARTIAL KNEE ARTHROPLASTY Right 12/09/2015   Procedure: RIGHT KNEE MEDIAL UNICOMPARTMENTAL ARTHROPLASTY ;  Surgeon: Gaynelle Arabian, MD;  Location: [facility];  Service: Orthopedics;  Laterality: Right;   TOTAL THYROIDECTOMY  05-01-2011   TRANSOBTURATOR SLING  01-07-2005   W/ CYSTOCELE REPAIR   UPPER GASTROINTESTINAL ENDOSCOPY  last one 06-29-2014   Social History   Socioeconomic History   Marital status: Married    Spouse name: Not on file  Number of children: 2   Years of education: Not on file   Highest education level: Not on file  Occupational History   Occupation: Biomedical engineer strain: Not on file   Food insecurity    Worry: Not on file    Inability: Not on file   Transportation needs    Medical: Not on file    Non-medical: Not on file  Tobacco Use   Smoking status: Former Smoker    Years: 5.00    Types: Cigarettes    Start date: 09/28/1984    Quit date: 08/28/1985    Years since quitting: 33.9   Smokeless tobacco: Never Used  Substance and Sexual Activity   Alcohol use: Yes    Comment: rarely   Drug use: No   Sexual activity: Not on file  Lifestyle   Physical activity    Days per week: Not on file    Minutes per session: Not on file   Stress: Not on file  Relationships   Social connections    Talks on phone: Not on file    Gets together: Not on file    Attends religious service: Not on file    Active member of club or organization: Not on file    Attends meetings of clubs or organizations: Not on file    Relationship status: Not on file   Intimate partner violence    Fear of current or ex partner: Not on  file    Emotionally abused: Not on file    Physically abused: Not on file    Forced sexual activity: Not on file  Other Topics Concern   Not on file  Social History Narrative   Not on file   Current Outpatient Medications on File Prior to Visit  Medication Sig Dispense Refill   [place] Medication Name: Use Pea sized amount rectally , Nitroglycerin ointment 0.125% three times a day for 6-8 weeks 30 g 1   amoxicillin (AMOXIL) 500 MG capsule Take 1 capsule (500 mg total) by mouth 3 (three) times daily. 30 capsule 0   calcium citrate-vitamin D (CITRACAL+D) 315-200 MG-UNIT tablet Take 1 tablet by mouth daily.     escitalopram (LEXAPRO) 10 MG tablet Take 1 tablet by mouth daily.  2   esomeprazole (NEXIUM) 20 MG capsule TAKE 1 CAPSULE TWICE DAILY BEFORE MEALS 60 capsule 11   famotidine (PEPCID) 20 MG tablet Take 1 tablet (20 mg total) by mouth 2 (two) times daily as needed for heartburn or indigestion. (Patient not taking: Reported on 04/27/2019) 60 tablet 3   hydrocortisone cream 1 % Apply 1 application topically 2 (two) times daily as needed.      Krill Oil 500 MG CAPS Take 1 capsule (500 mg total) by mouth daily.     montelukast (SINGULAIR) 10 MG tablet Take 1 tablet (10 mg total) by mouth daily as needed. (Patient not taking: Reported on 04/27/2019) 90 tablet 1   promethazine (PHENERGAN) 25 MG suppository Place 1 suppository (25 mg total) rectally every 6 (six) hours as needed for nausea or vomiting. (Patient not taking: Reported on 04/27/2019) 12 each 0   rosuvastatin (CRESTOR) 5 MG tablet Take 1 tablet (5 mg total) by mouth at bedtime. (Patient taking differently: Take 5 mg by mouth at bedtime. Takes on Tues and Saturday) 90 tablet 1   SYNTHROID 100 MCG tablet TAKE 1 TABLET ONCE DAILY BEFORE BREAKFAST. 34 tablet 0   Vitamin D, Ergocalciferol, (  DRISDOL) 50000 units CAPS capsule Take 1 capsule (50,000 Units total) by mouth every 7 (seven) days. (Patient not  taking: Reported on 04/27/2019) 12 capsule 1   Current Facility-Administered Medications on File Prior to Visit  Medication Dose Route Frequency Provider Last Rate Last Dose   0.9 %  sodium chloride infusion  500 mL Intravenous Once Nandigam, Venia Minks, MD       Allergies  Allergen Reactions   Celestone [Betamethasone Sodium Phosphate] Other (See Comments)    "Feels hot and looks sunburned"   Droperidol Other (See Comments)    "feels like I have withdrawal symptoms"   Omni-Pac [Cefdinir] Diarrhea and Other (See Comments)    Other Reaction: Other reaction   Statins Other (See Comments)    "joint stiffness"   Hydrocodone Itching and Rash   Family History  Problem Relation Age of Onset   Diabetes Father    Cancer Father        prostate   Stroke Father        bacterial endocarditis after prostate surgery   Heart disease Father        aortic valvular disease, MI   Thyroid disease Mother        hyperparathyroidism   Alzheimer's disease Mother    Hyperparathyroidism Mother    Atrial fibrillation Mother    Hypertension Mother    Hyperlipidemia Mother    Thyroid disease Maternal Grandmother    Hyperlipidemia Brother    Hypertension Brother    Allergies Brother    Heart disease Daughter        palpitations   Esophageal cancer Neg Hx    Colon cancer Neg Hx    Colon polyps Neg Hx    Rectal cancer Neg Hx    Stomach cancer Neg Hx     PE: BP 120/70    Pulse 70    Ht _0  (1.702 m)    Wt 183 lb (83 kg)    SpO2 98%    BMI 28.66 kg/m  Wt Readings from Last 3 Encounters:  08/17/19 183 lb (83 kg)  04/27/19 180 lb 8 oz (81.9 kg)  02/16/19 186 lb (84.4 kg)   Constitutional: overweight, in NAD Eyes: PERRLA, EOMI, no exophthalmos ENT: moist mucous membranes, no thyromegaly, no cervical lymphadenopathy Cardiovascular: RRR, No MRG Respiratory: CTA B Gastrointestinal: abdomen soft, NT, ND, BS+ Musculoskeletal: no deformities, strength intact in all 4 Skin:  moist, warm, no rashes Neurological: no tremor with outstretched hands, DTR normal in all 4  ASSESSMENT: 1. Thyroid cancer - see HPI  2. Postsurgical Hypothyroidism  3. Prediabetes  4. HL  5. Osteopenia  PLAN:  1. Follicular variant of papillary thyroid cancer  -Patient with stage I TNM PTC.  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  This is a slow-growing cancer with good prognosis, so her life expectancy is unlikely to be reduced by the diagnosis.  Her cancer was encapsulated, subcentimeter, without lymphovascular invasion, which further improved her prognosis.  She had capsular invasion, though, but this still qualifies as low risk per the newest thyroid cancer guidelines. -She denies neck compression symptoms and I do not feel any masses in her neck on palpation -We reviewed her latest neck ultrasound from 01/2018 but this did not show any suspicious masses -Latest thyroglobulin and ATA antibodies are undetectable.  We will recheck these at next visit.  2. Patient with history of total thyroidectomy  for cancer, now with iatrogenic hypothyroidism, on levothyroxine therapy - latest thyroid labs reviewed with pt >> slightly low, after which we decreased the dose of Synthroid from 112 to 100 mcg daily.  She did not return for another set of labs afterwards, but we will recheck this today. -Reviewed latest TSH: Lab Results  Component Value Date   TSH 0.26 (L) 02/16/2019   - she continues on Synthroid 100 mcg daily - pt feels good on this dose. - we discussed about taking the thyroid hormone every day, with water, >30 minutes before breakfast, separated by >4 hours from acid reflux medications, calcium, iron, multivitamins. Pt. is taking it correctly. - will check thyroid tests today: TSH and fT4 - If labs are abnormal, she will need to return for repeat TFTs in 1.5 months  3. Prediabetes -Good control, HbA1c was  5.9%, slightly higher than before -We checked another HbA1c today: 5.3% (excellent!)  4. HL -Reviewed latest lipid panel from 01/2019: LDL still above target, the rest of the fractions at goal Lab Results  Component Value Date   CHOL 211 (H) 02/16/2019   HDL 60.20 02/16/2019   LDLCALC 133 (H) 02/16/2019   LDLDIRECT 212.6 08/10/2012   TRIG 93.0 02/16/2019   CHOLHDL 4 02/16/2019  -Continues Crestor 5 twice a week without side effects -We will recheck a lipid panel at next visit, fasting  5. Osteopenia -Reviewed her latest DEXA scan reports.  Her latest T-scores were much worse in 09/2017 so we discussed about starting Fosamax.  However, she has a history of significant GERD, so oral bisphosphonates may be a problem.  At last visit, she was not keen on starting any medication at that point we decided to wait until next bone density scan [facility]. -At last visit I also suggested to switch from calcium carbonate to calcium citrate as this has a better absorption in the setting of PPI use. -Review latest vitamin D level, which was normal in 07/2018, but slightly low at last visit. She did not increase vitamin D to 2000 units daily >> continues 1000 units daily + the amount in Citracal -We will recheck her level today  Component     Latest Ref Rng & Units 08/17/2019  Hemoglobin A1C     4.0 - 5.6 % 5.3  TSH     0.35 - 4.50 uIU/mL 0.37  VITD     30.00 - 100.00 ng/mL 33.96  T4,Free(Direct)     0.60 - 1.60 ng/dL 1.10  Labs are now normal so we can continue on the same doses of Synthroid and vitamin D.  Philemon Kingdom, MD PhD [facility]

## 2019-08-17 NOTE — Patient Instructions (Signed)
Please continue Synthroid 100 mcg daily.  Take the thyroid hormone every day, with water, at least 30 minutes before breakfast, separated by at least 4 hours from: - acid reflux medications - calcium - iron - multivitamins  Continue vitamin D 1000 units daily + Citracal+D.  Please stop at the lab.  Please come back for a follow-up appointment in 6 months.

## 2019-09-01 ENCOUNTER — Other Ambulatory Visit: Payer: Self-pay | Admitting: Internal Medicine

## 2019-10-10 ENCOUNTER — Ambulatory Visit: Payer: Managed Care, Other (non HMO)

## 2019-11-27 ENCOUNTER — Encounter: Payer: Self-pay | Admitting: Family Medicine

## 2019-11-29 NOTE — Telephone Encounter (Signed)
Spoke with patient regarding symptoms/appt.  Patient did not read MyChart message from PCP last night, however her symptoms have resolved.  She stopped taking Prednisone. Is currently taking Ibuprofen 600mg  BID for pain, which is working for her.  She has appt with chiropractor in [place] on Friday, 12/01/2019.

## 2019-12-14 ENCOUNTER — Other Ambulatory Visit: Payer: Self-pay | Admitting: Internal Medicine

## 2019-12-25 ENCOUNTER — Ambulatory Visit: Payer: Managed Care, Other (non HMO)

## 2019-12-25 ENCOUNTER — Ambulatory Visit
Admission: RE | Admit: 2019-12-25 | Discharge: 2019-12-25 | Disposition: A | Discharge status: Home / Self Care | Payer: Managed Care, Other (non HMO) | Source: Ambulatory Visit | Attending: Obstetrics and Gynecology | Admitting: Obstetrics and Gynecology

## 2019-12-25 ENCOUNTER — Other Ambulatory Visit: Payer: Self-pay

## 2019-12-25 DIAGNOSIS — Z1231 Encounter for screening mammogram for malignant neoplasm of breast: Secondary | ICD-10-CM

## 2020-01-02 ENCOUNTER — Encounter: Payer: Self-pay | Admitting: Gastroenterology

## 2020-01-02 ENCOUNTER — Ambulatory Visit: Payer: Managed Care, Other (non HMO) | Admitting: Gastroenterology

## 2020-01-02 VITALS — BP 112/74 | HR 82 | Temp 97.2°F | Ht 67.0 in | Wt 187.5 lb

## 2020-01-02 DIAGNOSIS — K219 Gastro-esophageal reflux disease without esophagitis: Secondary | ICD-10-CM | POA: Diagnosis not present

## 2020-01-02 DIAGNOSIS — K449 Diaphragmatic hernia without obstruction or gangrene: Secondary | ICD-10-CM | POA: Diagnosis not present

## 2020-01-02 DIAGNOSIS — K2270 Barrett's esophagus without dysplasia: Secondary | ICD-10-CM | POA: Diagnosis not present

## 2020-01-02 NOTE — Patient Instructions (Addendum)
Continue Nexium 20 mg daily as needed.   Follow up with Dr. Silverio Decamp in one year.

## 2020-01-02 NOTE — Progress Notes (Signed)
[name]    [id number]    1955-10-21  Primary Care Physician:Blyth, Bonnita Levan, MD  Referring Physician: Mosie Lukes, MD [address] [place],  [place] [address]   Chief complaint:  GERD  HPI:  64 year old female with history of chronic GERD, very short segment of Barrett's esophagus here for follow up visit  Pill dysphagia with large pill. No solid or liquid dysphagia  She is concerned about vit D Deficiency with long-term PPI use.   She is currently taking Nexium 1-2 20 mg tablets daily, has breakthrough heartburn on average once or twice a week  Last office visit in July 2020 for rectal bleeding in the setting of anal fissure, improved with rectal nitroglycerin  Denies any nausea, vomiting, abdominal pain, melena or bright red blood per rectum  H/o symptomatic hemorrhoids status post hemorrhoidal band ligation X1 May 2017  Colonoscopy June 29, 2017 left-sided diverticulosis, mild erythema, superficial ulcerated mucosa in distal rectum biopsied, showed normal mucosa with no evidence of inflammation or proctitis.  Small nonbleeding internal hemorrhoids  EGD November 11, 2018: 5 cm hiatal hernia, irregular Z line with less than 1 cm mucosal changes suggestive of Barrett's esophagus Gastric fundic polyps otherwise unremarkable exam    Outpatient Encounter Medications as of 01/02/2020  Medication Sig  . AMBULATORY NON FORMULARY MEDICATION Medication Name: Use Pea sized amount rectally , Nitroglycerin ointment 0.125% three times a day for 6-8 weeks  . calcium citrate-vitamin D (CITRACAL+D) 315-200 MG-UNIT tablet Take 1 tablet by mouth daily.  Marland Kitchen escitalopram (LEXAPRO) 10 MG tablet Take 1 tablet by mouth daily.  Marland Kitchen esomeprazole (NEXIUM) 20 MG capsule TAKE 1 CAPSULE TWICE DAILY BEFORE MEALS  . hydrocortisone cream 1 % Apply 1 application topically 2 (two) times daily as needed.   [name] 500 MG CAPS Take 1 capsule (500 mg total) by  mouth daily.  . rosuvastatin (CRESTOR) 5 MG tablet Take 1 tablet (5 mg total) by mouth at bedtime. (Patient taking differently: Take 5 mg by mouth at bedtime. Takes on Tues and Saturday)  . SYNTHROID 100 MCG tablet TAKE 1 TABLET ONCE DAILY BEFORE BREAKFAST.  Marland Kitchen Vitamin D, Ergocalciferol, (DRISDOL) 50000 units CAPS capsule Take 1 capsule (50,000 Units total) by mouth every 7 (seven) days. (Patient not taking: Reported on 04/27/2019)   Facility-Administered Encounter Medications as of 01/02/2020  Medication  . 0.9 %  sodium chloride infusion    Allergies as of 01/02/2020 - Review Complete 08/17/2019  Allergen Reaction Noted  . Celestone [betamethasone sodium phosphate] Other (See Comments) 10/12/2011  . Droperidol Other (See Comments) 10/12/2011  . Omni-pac [cefdinir] Diarrhea and Other (See Comments) 08/28/2014  . Statins Other (See Comments) 08/28/2014  . Hydrocodone Itching and Rash 11/22/2015    Past Medical History:  Diagnosis Date  . Acute upper respiratory infection 09/13/2016  . Allergic rhinitis   . Allergy   . Anxiety state 08/24/2016  . Barrett's esophagus without dysplasia    pt says was suspected never confirmed 9 or 10 years ago  . Cancer [facility])    thyroid cancer  . Depression   . Esophagitis   . Fundic gland polyps of stomach, benign   . GERD (gastroesophageal reflux disease)   . Hemorrhoids   . History of hiatal hernia   . History of ovarian cyst   . History of thyroid cancer    05-01-2011  s/p  total thyroidectomy for multinodular  goiter and papillary carcinoma  . Hyperglycemia 03/01/2016  . Hyperlipidemia    borderline  . Hypothyroidism, postsurgical   . Internal hemorrhoids   . Lumbar spondylosis    L4 -- S1  . Osteoporosis    osteopenia  . Overweight 08/14/2009   Qualifier: Diagnosis of  By: Niel Hummer MD, Lorinda Creed   . Palpitations 03/01/2016  . PONV (postoperative nausea and vomiting)    nausea with pain meds  . TMJ (temporomandibular joint disorder)       Past Surgical History:  Procedure Laterality Date  . APPENDECTOMY  01-30-2008  . CARPAL TUNNEL RELEASE Right 08/30/2014   Procedure: RIGHT CARPAL TUNNEL RELEASE;  Surgeon: Linna Hoff, MD;  Location: [facility];  Service: Orthopedics;  Laterality: Right;  . COLONOSCOPY  08-08-2007  . HEMORRHOID SURGERY    . LAPAROSCOPIC RIGHT OOPHORECTOMY  1996  . LAPAROSCOPIC TUBAL LIGATION  07-21-2002   W/   Dilatation and evacuation  . LEFT SHOULDER CLOSED MANIPULATION/  ARTHROSCOPIC DEBRIDEMENT LABRUM/  SAD/ RELEASE  CA LIGAMENT/ BURSECTOMY  10-12-2011  . PARTIAL KNEE ARTHROPLASTY Right 12/09/2015   Procedure: RIGHT KNEE MEDIAL UNICOMPARTMENTAL ARTHROPLASTY ;  Surgeon: Gaynelle Arabian, MD;  Location: [facility];  Service: Orthopedics;  Laterality: Right;  . TOTAL THYROIDECTOMY  05-01-2011  . TRANSOBTURATOR SLING  01-07-2005   W/ CYSTOCELE REPAIR  . UPPER GASTROINTESTINAL ENDOSCOPY  last one 06-29-2014    Family History  Problem Relation Age of Onset  . Diabetes Father   . Cancer Father        prostate  . Stroke Father        bacterial endocarditis after prostate surgery  . Heart disease Father        aortic valvular disease, MI  . Thyroid disease Mother        hyperparathyroidism  . Alzheimer's disease Mother   . Hyperparathyroidism Mother   . Atrial fibrillation Mother   . Hypertension Mother   . Hyperlipidemia Mother   . Thyroid disease Maternal Grandmother   . Hyperlipidemia Brother   . Hypertension Brother   . Allergies Brother   . Heart disease Daughter        palpitations  . Esophageal cancer Neg Hx   . Colon cancer Neg Hx   . Colon polyps Neg Hx   . Rectal cancer Neg Hx   . Stomach cancer Neg Hx     Social History   Socioeconomic History  . Marital status: Married    Spouse name: Not on file  . Number of children: 2  . Years of education: Not on file  . Highest education level: Not on file  Occupational History  . Occupation: realtor  Tobacco Use   . Smoking status: Former Smoker    Years: 5.00    Types: Cigarettes    Start date: 09/28/1984    Quit date: 08/28/1985    Years since quitting: 34.3  . Smokeless tobacco: Never Used  Substance and Sexual Activity  . Alcohol use: Yes    Comment: rarely  . Drug use: No  . Sexual activity: Not on file  Other Topics Concern  . Not on file  Social History Narrative  . Not on file   Social Determinants of Health   Financial Resource Strain:   . Difficulty of Paying Living Expenses:   Food Insecurity:   . Worried About Charity fundraiser in the Last Year:   . [place] in the Last Year:  Transportation Needs:   . Film/video editor (Medical):   Marland Kitchen Lack of Transportation (Non-Medical):   Physical Activity:   . Days of Exercise per Week:   . Minutes of Exercise per Session:   Stress:   . Feeling of Stress :   Social Connections:   . Frequency of Communication with Friends and Family:   . Frequency of Social Gatherings with Friends and Family:   . Attends Religious Services:   . Active Member of Clubs or Organizations:   . Attends Archivist Meetings:   Marland Kitchen Marital Status:   Intimate Partner Violence:   . Fear of Current or Ex-Partner:   . Emotionally Abused:   Marland Kitchen Physically Abused:   . Sexually Abused:       Review of systems:  All other review of systems negative except as mentioned in the HPI.   Physical Exam: Vitals:   01/02/20 1312  BP: 112/74  Pulse: 82  Temp: (!) 97.2 F (36.2 C)   Body mass index is 29.37 kg/m. Gen:      No acute distress Neuro: alert and oriented x 3 Psych: normal mood and affect  Data Reviewed:  Reviewed labs, radiology imaging, old records and pertinent past GI work up   Assessment and Plan/Recommendations:  64 year old female with history of chronic GERD and hiatal hernia  GERD: Continue Nexium 20 mg daily as needed Continue antireflux measures and lifestyle modification  Intermittent pill dysphagia: No  esophageal stricture based on recent EGD Likely pills transiently dislodge in hiatal hernia sac  Discussed options for hernia repair and antireflux surgery/Nissen fundoplication if patient wants to avoid long-term PPI use.  Plan to monitor her symptoms for now and reconsider referral if needed later  Anal fissure and intermittent bright red blood per rectum resolved  Short segment Barrett's esophagus with no dysplasia: Due for surveillance EGD February 2025  Return in 1 year for follow-up visit  This visit required 40 minutes of patient care (this includes precharting, chart review, review of results, face-to-face time used for counseling as well as treatment plan and follow-up. The patient was provided an opportunity to ask questions and all were answered. The patient agreed with the plan and demonstrated an understanding of the instructions.  Damaris Hippo , MD    CC: Mosie Lukes, MD

## 2020-01-17 ENCOUNTER — Other Ambulatory Visit: Payer: Self-pay | Admitting: Internal Medicine

## 2020-01-17 NOTE — Telephone Encounter (Signed)
Please advise if this should be changed to a 90 day?

## 2020-01-17 NOTE — Telephone Encounter (Signed)
I would probably just refill it for 1 month now since she has an appointment on 5/19, just in case we need to change her dose.

## 2020-01-21 ENCOUNTER — Encounter: Payer: Self-pay | Admitting: Family Medicine

## 2020-01-22 ENCOUNTER — Other Ambulatory Visit: Payer: Self-pay

## 2020-01-23 ENCOUNTER — Encounter: Payer: Self-pay | Admitting: Family

## 2020-01-23 ENCOUNTER — Other Ambulatory Visit (INDEPENDENT_AMBULATORY_CARE_PROVIDER_SITE_OTHER): Payer: Managed Care, Other (non HMO)

## 2020-01-23 ENCOUNTER — Telehealth (INDEPENDENT_AMBULATORY_CARE_PROVIDER_SITE_OTHER): Payer: Managed Care, Other (non HMO) | Admitting: Family

## 2020-01-23 DIAGNOSIS — R300 Dysuria: Secondary | ICD-10-CM | POA: Diagnosis not present

## 2020-01-23 LAB — URINALYSIS, ROUTINE W REFLEX MICROSCOPIC
Bilirubin Urine: NEGATIVE
Nitrite: NEGATIVE
Specific Gravity, Urine: >=1.03 — AB (ref 1.000–1.030)
Total Protein, Urine: 30 — AB
Urine Glucose: NEGATIVE
Urobilinogen, UA: 0.2 (ref 0.0–1.0)
pH: 5 (ref 5.0–8.0)

## 2020-01-23 MED ORDER — NITROFURANTOIN MONOHYD MACRO 100 MG PO CAPS: 100.0000 mg | ORAL_CAPSULE | Freq: Two times a day (BID) | ORAL | 0 refills | Status: DC

## 2020-01-23 NOTE — Progress Notes (Signed)
Virtual Visit via Video Note  I connected with [name] on 01/23/20 at  9:40 AM EDT by a video enabled telemedicine application and verified that I am speaking with the correct person using two identifiers.  Location: Patient: home Provider: work   I discussed the limitations of evaluation and management by telemedicine and the availability of in person appointments. The patient expressed understanding and agreed to proceed.  History of Present Illness:  Patient is a 64 yr old female who presents today with report of dysuria which began on 01/19/20.  She hs a hx of a bladder sling. Yesterday she started drinking cranberry juice. No dysuria today.  She has urgency, but no back pain, dysuria. She is leaving to go out of town on Saturday.    Observations/Objective:   Gen: Awake, alert, no acute distress Resp: Breathing is even and non-labored Psych: calm/pleasant demeanor Neuro: Alert and Oriented x 3, + facial symmetry, speech is clear.   Assessment and Plan:  Dysuria- pt will drop a urine specimen at our [place] lab today for UA/Culture.  Will begin empiric macrobid. She is advised to call if symptoms worsen or if symptoms fail to improve.   Follow Up Instructions:    I discussed the assessment and treatment plan with the patient. The patient was provided an opportunity to ask questions and all were answered. The patient agreed with the plan and demonstrated an understanding of the instructions.   The patient was advised to call back or seek an in-person evaluation if the symptoms worsen or if the condition fails to improve as anticipated.  Nance Pear, NP

## 2020-01-25 ENCOUNTER — Encounter: Payer: Self-pay | Admitting: Family

## 2020-01-25 LAB — URINE CULTURE
MICRO NUMBER:: 10410624
SPECIMEN QUALITY:: ADEQUATE

## 2020-02-01 ENCOUNTER — Encounter: Payer: Managed Care, Other (non HMO) | Admitting: Family Medicine

## 2020-02-13 ENCOUNTER — Other Ambulatory Visit: Payer: Self-pay

## 2020-02-14 ENCOUNTER — Encounter: Payer: Self-pay | Admitting: Internal Medicine

## 2020-02-14 ENCOUNTER — Ambulatory Visit: Payer: Managed Care, Other (non HMO) | Admitting: Internal Medicine

## 2020-02-14 VITALS — BP 120/70 | HR 88 | Ht 67.0 in | Wt 183.0 lb

## 2020-02-14 DIAGNOSIS — E782 Mixed hyperlipidemia: Secondary | ICD-10-CM | POA: Diagnosis not present

## 2020-02-14 DIAGNOSIS — E890 Postprocedural hypothyroidism: Secondary | ICD-10-CM

## 2020-02-14 DIAGNOSIS — C73 Malignant neoplasm of thyroid gland: Secondary | ICD-10-CM

## 2020-02-14 DIAGNOSIS — E559 Vitamin D deficiency, unspecified: Secondary | ICD-10-CM

## 2020-02-14 DIAGNOSIS — M8589 Other specified disorders of bone density and structure, multiple sites: Secondary | ICD-10-CM

## 2020-02-14 DIAGNOSIS — R7303 Prediabetes: Secondary | ICD-10-CM

## 2020-02-14 LAB — LIPID PANEL
Cholesterol: 235 mg/dL — ABNORMAL HIGH (ref 0–200)
HDL: 53 mg/dL (ref >39.00)
LDL Cholesterol: 164 mg/dL — ABNORMAL HIGH (ref 0–99)
NonHDL: 182.46
Total CHOL/HDL Ratio: 4
Triglycerides: 92 mg/dL (ref 0.0–149.0)
VLDL: 18.4 mg/dL (ref 0.0–40.0)

## 2020-02-14 LAB — T4, FREE: Free T4: 0.97 ng/dL (ref 0.60–1.60)

## 2020-02-14 LAB — HEMOGLOBIN A1C: Hgb A1c MFr Bld: 5.7 % (ref 4.6–6.5)

## 2020-02-14 LAB — TSH: TSH: 2.16 u[IU]/mL (ref 0.35–4.50)

## 2020-02-14 NOTE — Progress Notes (Signed)
Patient ID: [name], female   DOB: [birth year], 64 y.o.   MRN: [id number]   This visit occurred during the SARS-CoV-2 public health emergency.  Safety protocols were in place, including screening questions prior to the visit, additional usage of staff PPE, and extensive cleaning of exam room while observing appropriate contact time as indicated for disinfecting solutions.   HPI  [name] is a 64 y.o.-year-old female, presenting for follow-up for follicular variant of papillary thyroid cancer and postsurgical hypothyroidism.  Last visit 6 months ago. She moved to [place]  from [place], where she was followed by endocrinology (reviewed notes by Dr. Virgina Evener). Prev. Seen by Dr. Forde Dandy and then [place] in town.  She just returned from [place] where she helped her 2 children moving.  Thyroid cancer: -Diagnosed in 2012  Reviewed thyroid cancer history:   04/30/2017: total thyroidectomy - Dr Harlow Asa:     - Hurthle cell changes  2012: RAI Tx: 100 mci I131  02/06/2014: Neck ultrasound: negative for metastasis per review of previous endocrinologist notes.  However, there were 2 small rounded lymph nodes in the left neck, measuring 5.1 x 2.5 mm and 3.8 x 3.6 mm.  They did not have visible hilar lines.  02/25/2018: Neck ultrasound: No residual thyroid tissue or enlarged lymph nodes  Thyroglobulin levels reviewed: Lab Results  Component Value Date   THYROGLB <0.1 (L) 02/16/2019   THYROGLB <0.1 (L) 02/11/2018   THGAB <1 02/16/2019   THGAB <1 02/11/2018  01/23/2016: Tg <0.1 (IMA), ATA <1.0, TSH 0.186 02/06/2014: Tg <0.1, ATA <1.0, TSH 0.707 09/01/2011: Tg <0.5, ATA <20, TSH 0.140  Postsurgical hypothyroidism:  She is on Synthroid 100 mcg daily: - in am - fasting - at least 30 min from b'fast - no Fe - + Ca, MVI, PPIs-later in the day - not on Biotin  Review TFTs: Lab Results  Component Value Date   TSH 0.37 08/17/2019   TSH 0.26 (L) 02/16/2019   TSH 0.58  01/10/2018   TSH 0.76 08/07/2016   TSH 2.59 03/23/2016   TSH 1.15 09/03/2015   TSH 1.15 07/16/2014   FREET4 1.10 08/17/2019   FREET4 1.13 02/16/2019   FREET4 0.87 01/10/2018    Pt denies: - feeling nodules in neck - hoarseness - dysphagia - choking - SOB with lying down  He has a history of esophageal dilations x2.  She has + FH of thyroid disorders in: MGM (goiter); parathyroid ds. In M. No FH of thyroid cancer. No h/o radiation tx to head or neck.  No herbal supplements. No Biotin use. No recent steroids use.   Prediabetes:  Reviewed HbA1c levels: Lab Results  Component Value Date   HGBA1C 5.3 08/17/2019   HGBA1C 5.9 02/16/2019   HGBA1C 5.1 08/15/2018   HGBA1C 6.0 04/05/2018   HGBA1C 5.9 11/30/2017   HGBA1C 6.0 01/29/2017   HGBA1C 5.8 08/07/2016   HGBA1C 5.9 02/21/2016   HGBA1C 5.9 09/03/2015   Father with DM2.  Osteopenia.   She was previously on Evista but she could not tolerate it due to headaches. I did suggest Fosamax in the past but she did not want to start any medications at that time.  She does have a significant history of GERD.  [facility] L1-L4: -2.0 RFN: -1.9 LFN: -1.2 This was worse compared to DXA scan report from 08/15/2014: L1-L4: -1.6 RFN: -0.7 LFN: -1.2  Hyperlipidemia:  Reviewed latest lipid panel:    Component Value Date/Time  CHOL 211 (H) 02/16/2019 1007   TRIG 93.0 02/16/2019 1007   HDL 60.20 02/16/2019 1007   CHOLHDL 4 02/16/2019 1007   VLDL 18.6 02/16/2019 1007   LDLCALC 133 (H) 02/16/2019 1007   LDLDIRECT 212.6 08/10/2012 1012  She continues on present 5 twice a week and also may correct  She was previously on 50,000 units vitamin D weekly, but not consistent with this.  On 2000 units vitamin D daily + the amount in Citracal.  Lab Results  Component Value Date   VD25OH 33.96 08/17/2019   VD25OH 31.72 02/16/2019   VD25OH 42.10 08/15/2018   VD25OH 34.66 04/05/2018   VD25OH 51.27  01/29/2017   VD25OH 36.00 11/05/2016   VD25OH 25.11 (L) 08/07/2016   VD25OH 39.41 07/16/2014   She continues to walk for exercise.  She separated from her husband for 30 years last year. He has Renal cell CA and  ESRD.  ROS: Constitutional: no weight gain/no weight loss, no fatigue, no subjective hyperthermia, no subjective hypothermia Eyes: no blurry vision, no xerophthalmia ENT: no sore throat, + see HPI Cardiovascular: no CP/no SOB/no palpitations/+ leg swelling while in [place], now resolved Respiratory: no cough/no SOB/no wheezing Gastrointestinal: no N/no V/no D/no C/no acid reflux Musculoskeletal: no muscle aches/no joint aches Skin: no rashes, no hair loss, + itching on palms (thinks from cleaning agents) Neurological: no tremors/no numbness/no tingling/no dizziness  I reviewed pt's medications, allergies, PMH, social hx, family hx, and changes were documented in the history of present illness. Otherwise, unchanged from my initial visit note.  Past Medical History:  Diagnosis Date  . Acute upper respiratory infection 09/13/2016  . Allergic rhinitis   . Allergy   . Anxiety state 08/24/2016  . Barrett's esophagus without dysplasia    pt says was suspected never confirmed 9 or 10 years ago  . Cancer [facility])    thyroid cancer  . Depression   . Esophagitis   . Fundic gland polyps of stomach, benign   . GERD (gastroesophageal reflux disease)   . Hemorrhoids   . History of hiatal hernia   . History of ovarian cyst   . History of thyroid cancer    05-01-2011  s/p  total thyroidectomy for multinodular goiter and papillary carcinoma  . Hyperglycemia 03/01/2016  . Hyperlipidemia    borderline  . Hypothyroidism, postsurgical   . Internal hemorrhoids   . Lumbar spondylosis    L4 -- S1  . Osteoporosis    osteopenia  . Overweight 08/14/2009   Qualifier: Diagnosis of  By: Niel Hummer MD, Lorinda Creed   . Palpitations 03/01/2016  . PONV (postoperative nausea and vomiting)     nausea with pain meds  . TMJ (temporomandibular joint disorder)    Past Surgical History:  Procedure Laterality Date  . APPENDECTOMY  01-30-2008  . CARPAL TUNNEL RELEASE Right 08/30/2014   Procedure: RIGHT CARPAL TUNNEL RELEASE;  Surgeon: Linna Hoff, MD;  [facility];  Service: Orthopedics;  Laterality: Right;  . COLONOSCOPY  08-08-2007  . HEMORRHOID SURGERY    . LAPAROSCOPIC RIGHT OOPHORECTOMY  1996  . LAPAROSCOPIC TUBAL LIGATION  07-21-2002   W/   Dilatation and evacuation  . LEFT SHOULDER CLOSED MANIPULATION/  ARTHROSCOPIC DEBRIDEMENT LABRUM/  SAD/ RELEASE  CA LIGAMENT/ BURSECTOMY  10-12-2011  . PARTIAL KNEE ARTHROPLASTY Right 12/09/2015   Procedure: RIGHT KNEE MEDIAL UNICOMPARTMENTAL ARTHROPLASTY ;  Surgeon: Gaynelle Arabian, MD;  Location: [facility];  Service: Orthopedics;  Laterality: Right;  . TOTAL THYROIDECTOMY  05-01-2011  . TRANSOBTURATOR SLING  01-07-2005   W/ CYSTOCELE REPAIR  . UPPER GASTROINTESTINAL ENDOSCOPY  last one 06-29-2014   Social History   Socioeconomic History  . Marital status: Married    Spouse name: Not on file  . Number of children: 2  . Years of education: Not on file  . Highest education level: Not on file  Occupational History  . Occupation: realtor  Tobacco Use  . Smoking status: Former Smoker    Years: 5.00    Types: Cigarettes    Start date: 09/28/1984    Quit date: 08/28/1985    Years since quitting: 34.4  . Smokeless tobacco: Never Used  Substance and Sexual Activity  . Alcohol use: Yes    Comment: rarely  . Drug use: No  . Sexual activity: Not on file  Other Topics Concern  . Not on file  Social History Narrative  . Not on file   Social Determinants of Health   Financial Resource Strain:   . Difficulty of Paying Living Expenses:   Food Insecurity:   . Worried About Charity fundraiser in the Last Year:   . Arboriculturist in the Last Year:   Transportation Needs:   . Film/video editor (Medical):   Marland Kitchen  Lack of Transportation (Non-Medical):   Physical Activity:   . Days of Exercise per Week:   . Minutes of Exercise per Session:   Stress:   . Feeling of Stress :   Social Connections:   . Frequency of Communication with Friends and Family:   . Frequency of Social Gatherings with Friends and Family:   . Attends Religious Services:   . Active Member of Clubs or Organizations:   . Attends Archivist Meetings:   Marland Kitchen Marital Status:   Intimate Partner Violence:   . Fear of Current or Ex-Partner:   . Emotionally Abused:   Marland Kitchen Physically Abused:   . Sexually Abused:    Current Outpatient Medications on File Prior to Visit  Medication Sig Dispense Refill  . calcium citrate-vitamin D (CITRACAL+D) 315-200 MG-UNIT tablet Take 1 tablet by mouth daily.    Marland Kitchen escitalopram (LEXAPRO) 10 MG tablet Take 1 tablet by mouth daily.  2  . esomeprazole (NEXIUM) 20 MG capsule TAKE 1 CAPSULE TWICE DAILY BEFORE MEALS 60 capsule 11  . Krill Oil 500 MG CAPS Take 1 capsule (500 mg total) by mouth daily.    . nitrofurantoin, macrocrystal-monohydrate, (MACROBID) 100 MG capsule Take 1 capsule (100 mg total) by mouth 2 (two) times daily. 10 capsule 0  . rosuvastatin (CRESTOR) 5 MG tablet Take 1 tablet (5 mg total) by mouth at bedtime. (Patient taking differently: Take 5 mg by mouth at bedtime. Takes on Tues and Saturday) 90 tablet 1  . SYNTHROID 100 MCG tablet TAKE 1 TABLET ONCE DAILY BEFORE BREAKFAST. 34 tablet 0  . Vitamin D, Ergocalciferol, (DRISDOL) 50000 units CAPS capsule Take 1 capsule (50,000 Units total) by mouth every 7 (seven) days. 12 capsule 1   Current Facility-Administered Medications on File Prior to Visit  Medication Dose Route Frequency Provider Last Rate Last Admin  . 0.9 %  sodium chloride infusion  500 mL Intravenous Once Nandigam, Venia Minks, MD       Allergies  Allergen Reactions  . Celestone [Betamethasone Sodium Phosphate] Other (See Comments)    "Feels hot and looks sunburned"  .  Droperidol Other (See Comments)    "feels like I have withdrawal symptoms"  .  Omni-Pac [Cefdinir] Diarrhea and Other (See Comments)    Other Reaction: Other reaction  . Statins Other (See Comments)    "joint stiffness"  . Hydrocodone Itching and Rash   Family History  Problem Relation Age of Onset  . Diabetes Father   . Cancer Father        prostate  . Stroke Father        bacterial endocarditis after prostate surgery  . Heart disease Father        aortic valvular disease, MI  . Thyroid disease Mother        hyperparathyroidism  . Alzheimer's disease Mother   . Hyperparathyroidism Mother   . Atrial fibrillation Mother   . Hypertension Mother   . Hyperlipidemia Mother   . Thyroid disease Maternal Grandmother   . Hyperlipidemia Brother   . Hypertension Brother   . Allergies Brother   . Heart disease Daughter        palpitations  . Esophageal cancer Neg Hx   . Colon cancer Neg Hx   . Colon polyps Neg Hx   . Rectal cancer Neg Hx   . Stomach cancer Neg Hx     PE: BP 120/70   Pulse 88   Ht '5\' 7"'$  (1.702 m)   Wt 183 lb (83 kg)   SpO2 97%   BMI 28.66 kg/m  Wt Readings from Last 3 Encounters:  02/14/20 183 lb (83 kg)  01/02/20 187 lb 8 oz (85 kg)  08/17/19 183 lb (83 kg)   Constitutional: overweight, in NAD Eyes: PERRLA, EOMI, no exophthalmos ENT: moist mucous membranes, no neck masses palpable, no cervical lymphadenopathy Cardiovascular: RRR, No MRG Respiratory: CTA B Gastrointestinal: abdomen soft, NT, ND, BS+ Musculoskeletal: no deformities, strength intact in all 4 Skin: moist, warm, no rashes Neurological: no tremor with outstretched hands, DTR normal in all 4  ASSESSMENT: 1. Thyroid cancer - see HPI  2. Postsurgical Hypothyroidism  3. Prediabetes  4. HL  5. Osteopenia  6. Vitamin D def.  PLAN:  1. Follicular variant of papillary thyroid cancer  -Patient with stage I TNM PTC.  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  This is a slow-growing cancer with good prognosis, so her life expectancy is unlikely to be reduced by the diagnosis.  Her cancer was encapsulated, subcentimeter, without lymphovascular invasion, which further improved her prognosis.  She had capsular invasion, though, but this still qualifies as low risk per the newest thyroid cancer guidelines. -She denies neck compression symptoms and I do not feel any masses on palpation of her neck -Reviewed the report of the latest neck ultrasound from 01/2018: No suspicious masses -Latest thyroglobulin and ATA antibodies are undetectable -We will recheck these today  2. Patient with history of total thyroidectomy for cancer, now with iatrogenic hypothyroidism, on levothyroxine therapy - latest thyroid labs reviewed with pt >> normal, but then the lower limit of the interval: Lab Results  Component Value Date   TSH 0.37 08/17/2019   - she continues on Synthroid d.a.w. 100 mcg daily - pt feels good on this dose. - we discussed about taking the thyroid hormone every day, with water, >30 minutes before breakfast, separated by >4 hours from acid reflux medications, calcium, iron, multivitamins. Pt. is taking it correctly. - will check thyroid tests today: TSH and fT4 - If labs are abnormal, she will need to return for repeat TFTs in 1.5 months  3. Prediabetes -Good control -Latest HbA1c from last visit was 5.3%, excellent -We will recheck this today  4. HL -Review latest lipid panel from 01/2019: LDL still above target, the rest of the fractions at goal Lab Results  Component Value Date   CHOL 211 (H) 02/16/2019   HDL 60.20 02/16/2019   LDLCALC 133 (H) 02/16/2019   LDLDIRECT 212.6 08/10/2012   TRIG 93.0 02/16/2019   CHOLHDL 4 02/16/2019  -Continues Crestor 5 twice a week - but takes this incosistently, without side effects -We will recheck a lipid panel -she is fasting  5. Osteopenia -Reviewed her latest  DXA scan reports.  Her T-scores were much worse in 09/2017 so we discussed about starting Fosamax, she has significant history of GERD so oral bisphosphonates may be a problem.  We discussed about other, parenteral, options but she was not keen on starting any medications until the next bone density scan.   -[facility] (Dr[facility] Corinna Capra): reportedly much better -In the past I also suggested to switch from calcium carbonate to calcium citrate for better absorption in the setting of PPI use  6.  Vitamin D deficiency -Latest vitamin D level was normal in 07/2019 -She continues vitamin D supplement: 2000 units daily + the amount in Citracal -We will recheck at next visit  Needs refills.  Component     Latest Ref Rng & Units 02/14/2020  Cholesterol     0 - 200 mg/dL 235 (H)  Triglycerides     0.0 - 149.0 mg/dL 92.0  HDL Cholesterol     >39.00 mg/dL 53.00  VLDL     0.0 - 40.0 mg/dL 18.4  LDL (calc)     0 - 99 mg/dL 164 (H)  Total CHOL/HDL Ratio      4  NonHDL      182.46  Thyroglobulin     ng/mL <0.1 (L)  Comment        Hemoglobin A1C     4.6 - 6.5 % 5.7  TSH     0.35 - 4.50 uIU/mL 2.16  T4,Free(Direct)     0.60 - 1.60 ng/dL 0.97  Thyroglobulin Ab     < or = 1 IU/mL <1  Vitamin D, 25-Hydroxy     30.0 - 100.0 ng/mL 45.0   TFTs normal, vit D normal, thyroglobulin and ATA antibodies undetectable. HbA1c higher, in the low prediabetic range.  LDL very high.  Will advise to take Crestor consistently and may need to move it every other day. Will emphasized the need to cut down on fatty  and processed foods, and concentrated sweets and increase the amount of plant foods.  Philemon Kingdom, MD PhD [facility] Endocrinology

## 2020-02-14 NOTE — Patient Instructions (Addendum)
Please continue Synthroid 100 mcg daily.  Take the thyroid hormone every day, with water, at least 30 minutes before breakfast, separated by at least 4 hours from: - acid reflux medications - calcium - iron - multivitamins  Continue vitamin D 2000 units daily + Citracal+D.  Please stop at the lab.  Please come back for a follow-up appointment in 1 year.

## 2020-02-15 ENCOUNTER — Other Ambulatory Visit: Payer: Self-pay | Admitting: Internal Medicine

## 2020-02-15 LAB — VITAMIN D 25 HYDROXY (VIT D DEFICIENCY, FRACTURES): Vit D, 25-Hydroxy: 45 ng/mL (ref 30.0–100.0)

## 2020-02-16 LAB — THYROGLOBULIN ANTIBODY: Thyroglobulin Ab: <1 IU/mL (ref <=1)

## 2020-02-16 LAB — THYROGLOBULIN LEVEL: Thyroglobulin: <0.1 ng/mL — ABNORMAL LOW

## 2020-02-27 ENCOUNTER — Other Ambulatory Visit: Payer: Self-pay | Admitting: Gastroenterology

## 2020-02-28 ENCOUNTER — Telehealth: Payer: Self-pay | Admitting: Gastroenterology

## 2020-02-28 NOTE — Telephone Encounter (Signed)
Spoke with [name]. She had a really bad migraine a few days ago. She woke up with it in full swing. She developed nausea and vomiting. She took Excedrin to treat the migraine. Since this happened, she has had abdominal /esophageal discomfort. Admits she has been taking Rolaids during the night sometimes, even before the most recent migraine because she has breakthrough symptoms of GERD. I have recommended she try Gaviscon for her breakthrough GERD. Her question is, why do you think she is continuing to have a queasiness? Do you think the Nexium is not working as well as it should.

## 2020-02-29 ENCOUNTER — Telehealth: Payer: Self-pay

## 2020-02-29 ENCOUNTER — Other Ambulatory Visit: Payer: Self-pay

## 2020-02-29 MED ORDER — ESOMEPRAZOLE MAGNESIUM 40 MG PO CPDR
40.0000 mg | DELAYED_RELEASE_CAPSULE | Freq: Two times a day (BID) | ORAL | 0 refills | Status: DC
Start: 2020-02-29 — End: 2020-04-18

## 2020-02-29 MED ORDER — SUCRALFATE 1 G PO TABS: 1.0000 g | ORAL_TABLET | Freq: Two times a day (BID) | ORAL | 0 refills | Status: AC | PRN

## 2020-02-29 NOTE — Telephone Encounter (Signed)
Incoming fax request for Nexium 40mg  BID  Submitted PA via covermymeds

## 2020-02-29 NOTE — Telephone Encounter (Signed)
Gerd symtoms likely exacerbated by Migraine and Nsaid's Please advise patient to increase Nexium to 40mg  BID for 1 month and then can taper it down to previous dose Carafate 1gm tablet BID prn.  Office f/u next available. Thanks

## 2020-02-29 NOTE — Telephone Encounter (Signed)
Discussed with the patient. Rx for the Nexium 40mg  BID and Carafate sent to [facility]. Asked pharmacist to let me know if there was any issue with the patient's insurance.

## 2020-03-01 NOTE — Telephone Encounter (Signed)
[name] ([name]) - [phone]  [place] 40 MG dr capsules Status: PA Response - Approved  Created: June 3rd, 2021  Sent: June 3rd, 2021

## 2020-04-02 ENCOUNTER — Telehealth: Payer: Self-pay | Admitting: Family Medicine

## 2020-04-02 ENCOUNTER — Other Ambulatory Visit: Payer: Self-pay

## 2020-04-02 ENCOUNTER — Other Ambulatory Visit: Payer: Self-pay | Admitting: Family Medicine

## 2020-04-02 ENCOUNTER — Telehealth: Payer: Self-pay | Admitting: Gastroenterology

## 2020-04-02 DIAGNOSIS — R0989 Other specified symptoms and signs involving the circulatory and respiratory systems: Secondary | ICD-10-CM

## 2020-04-02 MED ORDER — FAMOTIDINE 40 MG PO TABS
40.0000 mg | ORAL_TABLET | Freq: Every day | ORAL | 3 refills | Status: DC
Start: 2020-04-02 — End: 2020-04-03

## 2020-04-02 NOTE — Telephone Encounter (Signed)
HX GERD, reflux/ Nandigam patient on Nexium. Spoke with patient and she had an episode of "reflux" last night. States she had a big meal and forgot her Nexium. She woke up with reflux and took a Rolaid and then her Nexium. She had the "hot water feeling" in her throat and vomited. She thinks she aspirated.She could not speak or breathe and told her daughter to call 911. 911 arrived and "didn't hear anything in my lumgs". States they tried to get her to go to the hospital out of concern for possible heart issues but she declined. She states "I know it was reflux." Discussed with patient my concern that sometimes heart problems appear similar to GERD in women. She will call her PCP also and report the episode. She is asking if she will need antibiotics due to the episode since she feels she aspirated. Patient did schedule a f/u ov with Dr. Silverio Decamp in September. Please advise on antibiotics.

## 2020-04-02 NOTE — Telephone Encounter (Signed)
Patient given recommendations. She reports she has called Dr. Frederik Pear office also. She will come for CXR tomorrow. Order in epic per [place].

## 2020-04-02 NOTE — Telephone Encounter (Signed)
Caller; [name]  Call Back # [phone]   Patient states had a choking incident last night, patient states setting in her bed after a heavy meal began to  Vomit . Patient states severe acid reflux , did not take her Nexium medication last night and believes she my have exacerbated

## 2020-04-02 NOTE — Telephone Encounter (Signed)
Would not empirically treat for aspiration.  If she develops cough, shortness of breath or chest pain she should let us know.  Agree with your recommendation for her to discuss episode with primary care She should continue Nexium as prescribed Reasonable to check a PA and lateral chest x-ray tomorrow to rule out signs of aspiration

## 2020-04-02 NOTE — Telephone Encounter (Signed)
I have sent in Famotidine 40 mg for her to take at bedtime then she should take the nexium 40 mg bid, avoid heavy, fatty foods and alcohol and if symptoms persist should follow up with gastroenterology

## 2020-04-03 ENCOUNTER — Other Ambulatory Visit: Payer: Self-pay

## 2020-04-03 ENCOUNTER — Ambulatory Visit (INDEPENDENT_AMBULATORY_CARE_PROVIDER_SITE_OTHER)
Admission: RE | Admit: 2020-04-03 | Discharge: 2020-04-03 | Disposition: A | Discharge status: Home / Self Care | Payer: Managed Care, Other (non HMO) | Source: Ambulatory Visit | Attending: Internal Medicine | Admitting: Internal Medicine

## 2020-04-03 DIAGNOSIS — R0989 Other specified symptoms and signs involving the circulatory and respiratory systems: Secondary | ICD-10-CM

## 2020-04-03 NOTE — Telephone Encounter (Signed)
Patient called GI as well.  Her biggest concerned was her getting pneumonia.  Her GI ordered a CXR.  She does not want the famotidine at this time and she will follow directions of GI.

## 2020-04-18 ENCOUNTER — Other Ambulatory Visit: Payer: Self-pay

## 2020-04-18 ENCOUNTER — Encounter: Payer: Self-pay | Admitting: Family Medicine

## 2020-04-18 ENCOUNTER — Ambulatory Visit (INDEPENDENT_AMBULATORY_CARE_PROVIDER_SITE_OTHER): Payer: Managed Care, Other (non HMO) | Admitting: Family Medicine

## 2020-04-18 VITALS — BP 106/76 | HR 80 | Temp 97.9°F | Resp 12 | Ht 67.0 in | Wt 182.4 lb

## 2020-04-18 DIAGNOSIS — F329 Major depressive disorder, single episode, unspecified: Secondary | ICD-10-CM

## 2020-04-18 DIAGNOSIS — E890 Postprocedural hypothyroidism: Secondary | ICD-10-CM | POA: Diagnosis not present

## 2020-04-18 DIAGNOSIS — F32A Depression, unspecified: Secondary | ICD-10-CM

## 2020-04-18 DIAGNOSIS — E559 Vitamin D deficiency, unspecified: Secondary | ICD-10-CM | POA: Diagnosis not present

## 2020-04-18 DIAGNOSIS — K2100 Gastro-esophageal reflux disease with esophagitis, without bleeding: Secondary | ICD-10-CM

## 2020-04-18 DIAGNOSIS — Z0000 Encounter for general adult medical examination without abnormal findings: Secondary | ICD-10-CM | POA: Diagnosis not present

## 2020-04-18 DIAGNOSIS — F419 Anxiety disorder, unspecified: Secondary | ICD-10-CM

## 2020-04-18 DIAGNOSIS — R0600 Dyspnea, unspecified: Secondary | ICD-10-CM | POA: Diagnosis not present

## 2020-04-18 DIAGNOSIS — R7303 Prediabetes: Secondary | ICD-10-CM | POA: Diagnosis not present

## 2020-04-18 DIAGNOSIS — E782 Mixed hyperlipidemia: Secondary | ICD-10-CM

## 2020-04-18 LAB — CBC
HCT: 41.5 % (ref 36.0–46.0)
Hemoglobin: 13.9 g/dL (ref 12.0–15.0)
MCHC: 33.5 g/dL (ref 30.0–36.0)
MCV: 90.1 fl (ref 78.0–100.0)
Platelets: 248 10*3/uL (ref 150.0–400.0)
RBC: 4.61 Mil/uL (ref 3.87–5.11)
RDW: 12.9 % (ref 11.5–15.5)
WBC: 5.2 10*3/uL (ref 4.0–10.5)

## 2020-04-18 LAB — COMPREHENSIVE METABOLIC PANEL
ALT: 10 U/L (ref 0–35)
AST: 13 U/L (ref 0–37)
Albumin: 4.6 g/dL (ref 3.5–5.2)
Alkaline Phosphatase: 99 U/L (ref 39–117)
BUN: 17 mg/dL (ref 6–23)
CO2: 31 mEq/L (ref 19–32)
Calcium: 9.5 mg/dL (ref 8.4–10.5)
Chloride: 101 mEq/L (ref 96–112)
Creatinine, Ser: 0.72 mg/dL (ref 0.40–1.20)
GFR: 81.85 mL/min (ref >60.00)
Glucose, Bld: 101 mg/dL — ABNORMAL HIGH (ref 70–99)
Potassium: 4.7 mEq/L (ref 3.5–5.1)
Sodium: 139 mEq/L (ref 135–145)
Total Bilirubin: 0.5 mg/dL (ref 0.2–1.2)
Total Protein: 7 g/dL (ref 6.0–8.3)

## 2020-04-18 NOTE — Progress Notes (Signed)
Notify labs look good, no new concerns.

## 2020-04-18 NOTE — Assessment & Plan Note (Signed)
Supplement and monitor 

## 2020-04-18 NOTE — Assessment & Plan Note (Addendum)
Avoid offending foods, start probiotics. Do not eat large meals in late evening and consider raising head of bed. She has had some bad vomiting and aspiration episodes so will refer her for further evaluation and possible definitive treatment to Dr Lacey Jensen

## 2020-04-18 NOTE — Assessment & Plan Note (Signed)
Patient encouraged to maintain heart healthy diet, regular exercise, adequate sleep. Consider daily probiotics. Take medications as prescribed. Labs reviewed 

## 2020-04-18 NOTE — Assessment & Plan Note (Signed)
Encouraged heart healthy diet, increase exercise, avoid trans fats, consider a krill oil cap daily 

## 2020-04-18 NOTE — Assessment & Plan Note (Signed)
She struggled to breath during her aspiration event. Now doing well. EKG today, NSR with no acute concerns.

## 2020-04-18 NOTE — Assessment & Plan Note (Signed)
[id number] acceptable, minimize simple carbs. Increase exercise as tolerated.  

## 2020-04-18 NOTE — Patient Instructions (Signed)

## 2020-04-18 NOTE — Assessment & Plan Note (Signed)
On Levothyroxine, continue to monitor 

## 2020-04-18 NOTE — Progress Notes (Signed)
Subjective:    Patient ID: [name], female    DOB: 14-May-1956, 64 y.o.   MRN: [id number]  Chief Complaint  Patient presents with  . Annual Exam    HPI Patient is in today for annual preventative exam and follow upon chronic Very anxious since a bad vomiting episode back in May with a bad migraine and she has had increased reflux since then. She had a bad aspiration episode on 7/5 and she choked and had to call 911. This has really flared her anxiety and depression.  medical concerns such as high cholesterol and anxiety.  She is tearful about her relationship with her husband for whom she is separated and her daughter is stressing her as well. She is talking with a therapist in [place] weekly. She is tearful. Denies CP/palp/SOB/HA/congestion/fevers or GU c/o. Taking meds as prescribed.  Past Medical History:  Diagnosis Date  . Acute upper respiratory infection 09/13/2016  . Allergic rhinitis   . Allergy   . Anxiety state 08/24/2016  . Barrett's esophagus without dysplasia    pt says was suspected never confirmed 9 or 10 years ago  . Cancer [facility])    thyroid cancer  . Depression   . Esophagitis   . Fundic gland polyps of stomach, benign   . GERD (gastroesophageal reflux disease)   . Hemorrhoids   . History of hiatal hernia   . History of ovarian cyst   . History of thyroid cancer    05-01-2011  s/p  total thyroidectomy for multinodular goiter and papillary carcinoma  . Hyperglycemia 03/01/2016  . Hyperlipidemia    borderline  . Hypothyroidism, postsurgical   . Internal hemorrhoids   . Lumbar spondylosis    L4 -- S1  . Osteoporosis    osteopenia  . Overweight 08/14/2009   Qualifier: Diagnosis of  By: Niel Hummer MD, Lorinda Creed   . Palpitations 03/01/2016  . PONV (postoperative nausea and vomiting)    nausea with pain meds  . TMJ (temporomandibular joint disorder)     Past Surgical History:  Procedure Laterality Date  . APPENDECTOMY  01-30-2008  .  CARPAL TUNNEL RELEASE Right 08/30/2014   Procedure: RIGHT CARPAL TUNNEL RELEASE;  Surgeon: Linna Hoff, MD;  Location: [facility];  Service: Orthopedics;  Laterality: Right;  . COLONOSCOPY  08-08-2007  . HEMORRHOID SURGERY    . LAPAROSCOPIC RIGHT OOPHORECTOMY  1996  . LAPAROSCOPIC TUBAL LIGATION  07-21-2002   W/   Dilatation and evacuation  . LEFT SHOULDER CLOSED MANIPULATION/  ARTHROSCOPIC DEBRIDEMENT LABRUM/  SAD/ RELEASE  CA LIGAMENT/ BURSECTOMY  10-12-2011  . PARTIAL KNEE ARTHROPLASTY Right 12/09/2015   Procedure: RIGHT KNEE MEDIAL UNICOMPARTMENTAL ARTHROPLASTY ;  Surgeon: Gaynelle Arabian, MD;  Location: [facility];  Service: Orthopedics;  Laterality: Right;  . TOTAL THYROIDECTOMY  05-01-2011  . TRANSOBTURATOR SLING  01-07-2005   W/ CYSTOCELE REPAIR  . UPPER GASTROINTESTINAL ENDOSCOPY  last one 06-29-2014    Family History  Problem Relation Age of Onset  . Diabetes Father   . Cancer Father        prostate  . Stroke Father        bacterial endocarditis after prostate surgery  . Heart disease Father        aortic valvular disease, MI  . Thyroid disease Mother        hyperparathyroidism  . Alzheimer's disease Mother   . Hyperparathyroidism Mother   . Atrial fibrillation Mother   . Hypertension  Mother   . Hyperlipidemia Mother   . Thyroid disease Maternal Grandmother   . Hyperlipidemia Brother   . Hypertension Brother   . Allergies Brother   . Heart disease Daughter        palpitations  . Esophageal cancer Neg Hx   . Colon cancer Neg Hx   . Colon polyps Neg Hx   . Rectal cancer Neg Hx   . Stomach cancer Neg Hx     Social History   Socioeconomic History  . Marital status: Married    Spouse name: Not on file  . Number of children: 2  . Years of education: Not on file  . Highest education level: Not on file  Occupational History  . Occupation: realtor  Tobacco Use  . Smoking status: Former Smoker    Years: 5.00    Types: Cigarettes    Start date:  09/28/1984    Quit date: 08/28/1985    Years since quitting: 34.6  . Smokeless tobacco: Never Used  Vaping Use  . Vaping Use: Never used  Substance and Sexual Activity  . Alcohol use: Yes    Comment: rarely  . Drug use: No  . Sexual activity: Not on file  Other Topics Concern  . Not on file  Social History Narrative  . Not on file   Social Determinants of Health   Financial Resource Strain:   . Difficulty of Paying Living Expenses:   Food Insecurity:   . Worried About Charity fundraiser in the Last Year:   . Arboriculturist in the Last Year:   Transportation Needs:   . Film/video editor (Medical):   Marland Kitchen Lack of Transportation (Non-Medical):   Physical Activity:   . Days of Exercise per Week:   . Minutes of Exercise per Session:   Stress:   . Feeling of Stress :   Social Connections:   . Frequency of Communication with Friends and Family:   . Frequency of Social Gatherings with Friends and Family:   . Attends Religious Services:   . Active Member of Clubs or Organizations:   . Attends Archivist Meetings:   Marland Kitchen Marital Status:   Intimate Partner Violence:   . Fear of Current or Ex-Partner:   . Emotionally Abused:   Marland Kitchen Physically Abused:   . Sexually Abused:     Outpatient Medications Prior to Visit  Medication Sig Dispense Refill  . Calcium Citrate-Vitamin D (CITRACAL + D PO) Take 2 tablets by mouth daily. 1200mg     . escitalopram (LEXAPRO) 10 MG tablet Take 1 tablet by mouth daily.  2  . esomeprazole (NEXIUM) 20 MG capsule Take 1 capsule by mouth in the morning and at bedtime.    Marnee Spring Omega-3 Krill Oil 350 MG CAPS Take 1 capsule by mouth daily.    . Probiotic Product (PROBIOTIC PO) Take 1 capsule by mouth as needed. Lucky Probiotic    . rosuvastatin (CRESTOR) 5 MG tablet Take 5 mg by mouth every other day.    . sucralfate (CARAFATE) 1 g tablet Take 1 tablet (1 g total) by mouth 2 (two) times daily as needed. 60 tablet 0  . SYNTHROID 100 MCG tablet  TAKE 1 TABLET ONCE DAILY BEFORE BREAKFAST. 34 tablet 6  . Vitamin D, Cholecalciferol, 50 MCG (2000 UT) CAPS Take 1 capsule by mouth daily.    . calcium citrate-vitamin D (CITRACAL+D) 315-200 MG-UNIT tablet Take 1 tablet by mouth daily.    Marland Kitchen esomeprazole (  NEXIUM) 40 MG capsule Take 1 capsule (40 mg total) by mouth 2 (two) times daily before a meal. 60 capsule 0  . Krill Oil 500 MG CAPS Take 1 capsule (500 mg total) by mouth daily.    . nitrofurantoin, macrocrystal-monohydrate, (MACROBID) 100 MG capsule Take 1 capsule (100 mg total) by mouth 2 (two) times daily. 10 capsule 0  . rosuvastatin (CRESTOR) 5 MG tablet Take 1 tablet (5 mg total) by mouth at bedtime. (Patient taking differently: Take 5 mg by mouth at bedtime. Takes on Tues and Saturday) 90 tablet 1  . Vitamin D, Ergocalciferol, (DRISDOL) 50000 units CAPS capsule Take 1 capsule (50,000 Units total) by mouth every 7 (seven) days. 12 capsule 1  . 0.9 %  sodium chloride infusion      No facility-administered medications prior to visit.    Allergies  Allergen Reactions  . Celestone [Betamethasone Sodium Phosphate] Other (See Comments)    "Feels hot and looks sunburned"  . Droperidol Other (See Comments)    "feels like I have withdrawal symptoms"  . Omni-Pac [Cefdinir] Diarrhea and Other (See Comments)    Other Reaction: Other reaction  . Statins Other (See Comments)    "joint stiffness"  . Hydrocodone Itching and Rash    Review of Systems  Constitutional: Negative for chills, fever and malaise/fatigue.  HENT: Negative for congestion and hearing loss.   Eyes: Negative for discharge.  Respiratory: Negative for cough, sputum production and shortness of breath.   Cardiovascular: Negative for chest pain, palpitations and leg swelling.  Gastrointestinal: Positive for heartburn, nausea and vomiting. Negative for abdominal pain, blood in stool, constipation and diarrhea.  Genitourinary: Negative for dysuria, frequency and urgency.    Musculoskeletal: Negative for back pain, falls and myalgias.  Skin: Negative for rash.  Neurological: Negative for dizziness, sensory change, loss of consciousness, weakness and headaches.  Endo/Heme/Allergies: Negative for environmental allergies. Does not bruise/bleed easily.  Psychiatric/Behavioral: Positive for depression. Negative for suicidal ideas. The patient is nervous/anxious. The patient does not have insomnia.        Objective:    Physical Exam Constitutional:      General: She is not in acute distress.    Appearance: She is well-developed.  HENT:     Head: Normocephalic and atraumatic.  Eyes:     Conjunctiva/sclera: Conjunctivae normal.  Neck:     Thyroid: No thyromegaly.  Cardiovascular:     Rate and Rhythm: Normal rate and regular rhythm.     Heart sounds: Normal heart sounds. No murmur heard.   Pulmonary:     Effort: Pulmonary effort is normal. No respiratory distress.     Breath sounds: Normal breath sounds.  Abdominal:     General: Bowel sounds are normal. There is no distension.     Palpations: Abdomen is soft. There is no mass.     Tenderness: There is no abdominal tenderness.  Musculoskeletal:     Cervical back: Neck supple.  Lymphadenopathy:     Cervical: No cervical adenopathy.  Skin:    General: Skin is warm and dry.  Neurological:     Mental Status: She is alert and oriented to person, place, and time.  Psychiatric:        Behavior: Behavior normal.     BP 106/76 (BP Location: Left Arm, Cuff Size: Large)   Pulse 80   Temp 97.9 F (36.6 C) (Oral)   Resp 12   Ht 5\' 7"  (1.702 m)   Wt 182 lb 6.4 oz (  82.7 kg)   SpO2 97%   BMI 28.57 kg/m  Wt Readings from Last 3 Encounters:  04/18/20 182 lb 6.4 oz (82.7 kg)  02/14/20 183 lb (83 kg)  01/02/20 187 lb 8 oz (85 kg)    Diabetic Foot Exam - Simple   No data filed     Lab Results  Component Value Date   WBC 5.2 04/18/2020   HGB 13.9 04/18/2020   HCT 41.5 04/18/2020   PLT 248.0  04/18/2020   GLUCOSE 101 (H) 04/18/2020   CHOL 235 (H) 02/14/2020   TRIG 92.0 02/14/2020   HDL 53.00 02/14/2020   LDLDIRECT 212.6 08/10/2012   LDLCALC 164 (H) 02/14/2020   ALT 10 04/18/2020   AST 13 04/18/2020   NA 139 04/18/2020   K 4.7 04/18/2020   CL 101 04/18/2020   CREATININE 0.72 04/18/2020   BUN 17 04/18/2020   CO2 31 04/18/2020   TSH 2.16 02/14/2020   INR 1.10 12/03/2015   HGBA1C 5.7 02/14/2020    Lab Results  Component Value Date   TSH 2.16 02/14/2020   Lab Results  Component Value Date   WBC 5.2 04/18/2020   HGB 13.9 04/18/2020   HCT 41.5 04/18/2020   MCV 90.1 04/18/2020   PLT 248.0 04/18/2020   Lab Results  Component Value Date   NA 139 04/18/2020   K 4.7 04/18/2020   CO2 31 04/18/2020   GLUCOSE 101 (H) 04/18/2020   BUN 17 04/18/2020   CREATININE 0.72 04/18/2020   BILITOT 0.5 04/18/2020   ALKPHOS 99 04/18/2020   AST 13 04/18/2020   ALT 10 04/18/2020   PROT 7.0 04/18/2020   ALBUMIN 4.6 04/18/2020   CALCIUM 9.5 04/18/2020   ANIONGAP 8 12/11/2015   GFR 81.85 04/18/2020   Lab Results  Component Value Date   CHOL 235 (H) 02/14/2020   Lab Results  Component Value Date   HDL 53.00 02/14/2020   Lab Results  Component Value Date   LDLCALC 164 (H) 02/14/2020   Lab Results  Component Value Date   TRIG 92.0 02/14/2020   Lab Results  Component Value Date   CHOLHDL 4 02/14/2020   Lab Results  Component Value Date   HGBA1C 5.7 02/14/2020       Assessment & Plan:   Problem List Items Addressed This Visit    Postsurgical hypothyroidism (Chronic)    On Levothyroxine, continue to monitor      Prediabetes (Chronic)    hgba1c acceptable, minimize simple carbs. Increase exercise as tolerated.       Relevant Orders   Comprehensive metabolic panel (Completed)   Mixed hyperlipidemia    Encouraged heart healthy diet, increase exercise, avoid trans fats, consider a krill oil cap daily      Relevant Medications   rosuvastatin (CRESTOR) 5 MG  tablet   Acid reflux    Avoid offending foods, start probiotics. Do not eat large meals in late evening and consider raising head of bed. She has had some bad vomiting and aspiration episodes so will refer her for further evaluation and possible definitive treatment to Dr Lacey Jensen      Relevant Medications   esomeprazole (NEXIUM) 20 MG capsule   Probiotic Product (PROBIOTIC PO)   Other Relevant Orders   Ambulatory referral to Gastroenterology   CBC (Completed)   Anxiety and depression    Very anxious since a bad vomiting episode back in May with a bad migraine and she has had increased reflux since then. She had  a bad aspiration episode on 7/5 and she choked and had to call 911. This has really flared her anxiety and depression.  Increase Lexapro to 20 mg daily      Vitamin D deficiency    Supplement and monitor      Preventative health care - Primary    Patient encouraged to maintain heart healthy diet, regular exercise, adequate sleep. Consider daily probiotics. Take medications as prescribed. Labs reviewed.       Dyspnea    She struggled to breath during her aspiration event. Now doing well. EKG today, NSR with no acute concerns.       Relevant Orders   EKG 12-Lead (Completed)      I have discontinued Olin Hauser P. Mangieri "[name]"'s Krill Oil, Vitamin D (Ergocalciferol), calcium citrate-vitamin D, and nitrofurantoin (macrocrystal-monohydrate). I am also having her maintain her escitalopram, Synthroid, sucralfate, Calcium Citrate-Vitamin D (CITRACAL + D PO), Vitamin D (Cholecalciferol), esomeprazole, MegaRed Omega-3 Krill Oil, rosuvastatin, and Probiotic Product (PROBIOTIC PO). We will stop administering sodium chloride.  No orders of the defined types were placed in this encounter.    Penni Homans, MD

## 2020-04-18 NOTE — Assessment & Plan Note (Signed)
Very anxious since a bad vomiting episode back in May with a bad migraine and she has had increased reflux since then. She had a bad aspiration episode on 7/5 and she choked and had to call 911. This has really flared her anxiety and depression.  Increase Lexapro to 20 mg daily

## 2020-05-28 ENCOUNTER — Ambulatory Visit: Payer: Managed Care, Other (non HMO) | Admitting: Gastroenterology

## 2020-05-28 ENCOUNTER — Encounter: Payer: Self-pay | Admitting: Gastroenterology

## 2020-05-28 VITALS — BP 110/70 | HR 88 | Ht 66.0 in | Wt 185.0 lb

## 2020-05-28 DIAGNOSIS — K449 Diaphragmatic hernia without obstruction or gangrene: Secondary | ICD-10-CM

## 2020-05-28 DIAGNOSIS — K219 Gastro-esophageal reflux disease without esophagitis: Secondary | ICD-10-CM | POA: Diagnosis not present

## 2020-05-28 DIAGNOSIS — K2270 Barrett's esophagus without dysplasia: Secondary | ICD-10-CM | POA: Diagnosis not present

## 2020-05-28 DIAGNOSIS — K222 Esophageal obstruction: Secondary | ICD-10-CM

## 2020-05-28 NOTE — Patient Instructions (Addendum)
If you are age 63 or older, your body mass index should be between 23-30. Your Body mass index is 29.86 kg/m. If this is out of the aforementioned range listed, please consider follow up with your Primary Care Provider.  If you are age 49 or younger, your body mass index should be between 19-25. Your Body mass index is 29.86 kg/m. If this is out of the aformentioned range listed, please consider follow up with your Primary Care Provider.   You have been scheduled for an endoscopy. Please follow written instructions given to you at your visit today. If you use inhalers (even only as needed), please bring them with you on the day of your procedure.    It was a pleasure to see you today!  Vito Cirigliano, D.O.

## 2020-05-28 NOTE — Progress Notes (Signed)
P  Chief Complaint:    GERD, hiatal hernia, discussion of antireflux surgery  GI History: 64 year old female with longstanding history of reflux and hiatal hernia, complicated by ultrashort segment nondysplastic Barrett's esophagus, presenting today to discuss antireflux surgical options with a goal to stop or significantly reduce acid suppression therapy.   GERD history: -Index symptoms: HB, regurgitation, belching, nausea -Medications trialed: Nexium, Carafate -Current medications: Nexium 20 mg bid -Complications: Short segment nondysplastic Barrett's esophagus  GERD evaluation: -Last EGD: 10/2018 -Barium esophagram: None -Esophageal Manometry: 2001: Normal peristalsis, diminished LES pressures with normal relaxation -pH/Impedance: None -Bravo: None  Endoscopic History: -EGD (10/2018, Dr. Silverio Decamp): 5 cm HH, irregular Z-line with <1cm segment of nondysplastic Barrett's esophagus.  Fundic gland polyps.  Repeat 2025 -EGD (06/2014, Dr. Fuller Plan): LA Grade A esophagitis, 1 cm segment of salmon-colored mucosa (biopsies: Reflux changes without intestinal metaplasia), small hiatal hernia.  Fundic polyps -EGD (11/2010): GEJ biopsies with reflux changes without intestinal metaplasia -EGD (07/2007): Irregular Z-line (GEJ biopsy with focal intestinal metaplasia c/w nondysplastic Barrett's), 3 cm HH.  Gland polyps -EGD (04/2000, Dr. Sharlett Iles): 5-6 cm HH, Schatzki's ring dilated with 18 mm Savary   GERD-HRQL Questionnaire Score: 25/50 (on high-dose PPI)    HPI:     Patient is a 64 y.o. female presenting to the [facility]astroenterology Clinic for evaluation of antireflux surgical options as above.  Today, she reports ongoing intermittent reflux symptoms, and very strongly wants to come off PPI therapy if at all possible.  More recently, she had an episode of nocturnal regurgitation with self-reported aspiration event while sleeping.  She called EMS, but was able to regain normal breathing shortly  before their arrival.  This occurred on 04/02/2020.  No prior similar episodes and none since.  Does have intermittent nocturnal reflux symptoms, which is been worsening over the last year.  Nexium typically controls her reflux, but does have to take occasional Rolaids for breakthrough symptoms.  Family history notable for brother with Barrett's esophagus as well.    Review of systems:     No chest pain, no SOB, no fevers, no urinary sx   Past Medical History:  Diagnosis Date  . Acute upper respiratory infection 09/13/2016  . Allergic rhinitis   . Allergy   . Anxiety state 08/24/2016  . Barrett's esophagus without dysplasia    pt says was suspected never confirmed 9 or 10 years ago  . Cancer [facility])    thyroid cancer  . Depression   . Esophagitis   . Fundic gland polyps of stomach, benign   . GERD (gastroesophageal reflux disease)   . Hemorrhoids   . History of hiatal hernia   . History of ovarian cyst   . History of thyroid cancer    05-01-2011  s/p  total thyroidectomy for multinodular goiter and papillary carcinoma  . Hyperglycemia 03/01/2016  . Hyperlipidemia    borderline  . Hypothyroidism, postsurgical   . Internal hemorrhoids   . Lumbar spondylosis    L4 -- S1  . Osteoporosis    osteopenia  . Overweight 08/14/2009   Qualifier: Diagnosis of  By: Niel Hummer MD, Lorinda Creed   . Palpitations 03/01/2016  . PONV (postoperative nausea and vomiting)    nausea with pain meds  . TMJ (temporomandibular joint disorder)     Patient's surgical history, family medical history, social history, medications and allergies were all reviewed in Epic    Current Outpatient Medications  Medication Sig Dispense Refill  . Calcium Citrate-Vitamin D (CITRACAL +  D PO) Take 2 tablets by mouth daily. 1200mg     . escitalopram (LEXAPRO) 10 MG tablet Take 1 tablet by mouth daily.  2  . esomeprazole (NEXIUM) 20 MG capsule Take 1 capsule by mouth in the morning and at bedtime.    Marnee Spring Omega-3  Krill Oil 350 MG CAPS Take 1 capsule by mouth daily.    . Probiotic Product (PROBIOTIC PO) Take 1 capsule by mouth as needed. Lucky Probiotic    . rosuvastatin (CRESTOR) 5 MG tablet Take 5 mg by mouth every other day.    . sucralfate (CARAFATE) 1 g tablet Take 1 tablet (1 g total) by mouth 2 (two) times daily as needed. 60 tablet 0  . SYNTHROID 100 MCG tablet TAKE 1 TABLET ONCE DAILY BEFORE BREAKFAST. 34 tablet 6  . Vitamin D, Cholecalciferol, 50 MCG (2000 UT) CAPS Take 1 capsule by mouth daily.     No current facility-administered medications for this visit.    Physical Exam:     BP 110/70   Pulse 88   Ht 5\' 6"  (1.676 m)   Wt 185 lb (83.9 kg)   BMI 29.86 kg/m   GENERAL:  Pleasant female in NAD PSYCH: : Cooperative, normal affect NEURO: Alert and oriented x 3, no focal neurologic deficits   IMPRESSION and PLAN:    1) GERD 2) Ultrashort segment nondysplastic Barrett's esophagus 3) Hiatal hernia 4) History of Schatzki's ring  64 year old female with longstanding history of reflux, has had several (at least 10) upper endoscopies since 1995.  The best I can tell, only 1 of those endoscopies (07/2007) demonstrated Barrett's esophagus (ultrashort segment, nondysplastic).  There was reflux esophagitis on endoscopy in 2015.  Irregular Z-line again on endoscopy in 10/2018 (no biopsies) along with 5 cm HH.  We will long discussion today regarding ongoing management of reflux, to include continued medical management versus antireflux surgical options.  Given history of large hiatal hernia, suspect she will need laparoscopic hiatal hernia repair with either concomitant TIF or primary surgical fundoplication.  Discussed each of these procedures at length today, to include risks/benefits, postoperative recovery, and will proceed as follows:  -Repeat EGD now to establish size/grade of hiatal hernia, again evaluate for erosive esophagitis, dilation of any esophageal strictures, preoperative  assessment, and again evaluate for Barrett's esophagus -Continue PPI for now -Continue antireflux lifestyle/dietary modifications -Discussed the relative dearth of literature regarding ongoing PPI need in patients with Barrett's after antireflux surgery, and will plan on individualized shared decision -To discuss likely need for Esophageal Manometry.  She does have some significant anxiety about this procedure.  Will address again as needed at follow-up -To follow-up with me after repeat EGD  The indications, risks, and benefits of EGD were explained to the patient in detail. Risks include but are not limited to bleeding, perforation, adverse reaction to medications, and cardiopulmonary compromise. Sequelae include but are not limited to the possibility of surgery, hositalization, and mortality. The patient verbalized understanding and wished to proceed. All questions answered, referred to scheduler. Further recommendations pending results of the exam.     Lavena Bullion ,DO, FACG 05/28/2020, 3:23 PM

## 2020-05-29 ENCOUNTER — Encounter: Payer: Self-pay | Admitting: Gastroenterology

## 2020-05-30 ENCOUNTER — Encounter: Payer: Managed Care, Other (non HMO) | Admitting: Gastroenterology

## 2020-05-31 ENCOUNTER — Encounter: Payer: Self-pay | Admitting: Family Medicine

## 2020-05-31 ENCOUNTER — Telehealth (INDEPENDENT_AMBULATORY_CARE_PROVIDER_SITE_OTHER): Payer: Managed Care, Other (non HMO) | Admitting: Family Medicine

## 2020-05-31 ENCOUNTER — Other Ambulatory Visit: Payer: Self-pay

## 2020-05-31 DIAGNOSIS — E559 Vitamin D deficiency, unspecified: Secondary | ICD-10-CM | POA: Diagnosis not present

## 2020-05-31 DIAGNOSIS — F419 Anxiety disorder, unspecified: Secondary | ICD-10-CM | POA: Diagnosis not present

## 2020-05-31 DIAGNOSIS — K219 Gastro-esophageal reflux disease without esophagitis: Secondary | ICD-10-CM

## 2020-05-31 DIAGNOSIS — F32A Depression, unspecified: Secondary | ICD-10-CM

## 2020-05-31 DIAGNOSIS — R7303 Prediabetes: Secondary | ICD-10-CM | POA: Diagnosis not present

## 2020-05-31 DIAGNOSIS — E890 Postprocedural hypothyroidism: Secondary | ICD-10-CM

## 2020-05-31 DIAGNOSIS — F329 Major depressive disorder, single episode, unspecified: Secondary | ICD-10-CM

## 2020-05-31 MED ORDER — ROSUVASTATIN CALCIUM 5 MG PO TABS: 5.0000 mg | ORAL_TABLET | ORAL | 1 refills | Status: DC

## 2020-06-02 NOTE — Assessment & Plan Note (Signed)
On Levothyroxine, continue to monitor. She has an appt with endocrinology next week.

## 2020-06-02 NOTE — Assessment & Plan Note (Signed)
She has a 5 cm Hiatal hernia and is under the care of Dr Bryan Lemma who is helping her to sort out which procedure she needs to control her symptoms. Her symptoms are some improved on current meds and with small meals.

## 2020-06-02 NOTE — Assessment & Plan Note (Signed)
[id number] acceptable, minimize simple carbs. Increase exercise as tolerated.  

## 2020-06-02 NOTE — Assessment & Plan Note (Signed)
Supplement and monitor 

## 2020-06-02 NOTE — Assessment & Plan Note (Signed)
She was very tearful and anxious at her last visit. She feels much better and is taking her Escitalopram. She is in group therapy

## 2020-06-02 NOTE — Progress Notes (Signed)
Virtual Visit via Video Note  I connected with [name] on 05/31/20 at  9:20 AM EDT by a video enabled telemedicine application and verified that I am speaking with the correct person using two identifiers.  Location: Patient: home, patient and provider in visit Provider: home   I discussed the limitations of evaluation and management by telemedicine and the availability of in person appointments. The patient expressed understanding and agreed to proceed. Kem Boroughs, CMA was able to get the patient set up on a video visit     Subjective:    Patient ID: [name], female    DOB: [birth year], 64 y.o.   MRN: [id number]  Chief Complaint  Patient presents with  . Follow-up 4/6 weeks    HPI Patient is in today for follow up on chronic medical concerns. At her last visit she was struggling with significant stressors and was very anxious and tearful. Her 63 year old dog had cancer, her estranged and unpleasant husband's health was deteriorating and her children were far away and selfish at times. Today she is managing better. Her daughter and her are doing well. Her husband's health is declining but she is managing. She still notes anhedonia but no suicidal ideation. She is now following with gastroenterology due to her bad reflux and hiatal hernia. They are in the process of deciding which procedure she needs to manage her symptoms. Denies CP/palp/SOB/HA/congestion/fevers or GU c/o. Taking meds as prescribed  Past Medical History:  Diagnosis Date  . Acute upper respiratory infection 09/13/2016  . Allergic rhinitis   . Allergy   . Anxiety state 08/24/2016  . Barrett's esophagus without dysplasia    pt says was suspected never confirmed 9 or 10 years ago  . Cancer [facility])    thyroid cancer  . Depression   . Esophagitis   . Fundic gland polyps of stomach, benign   . GERD (gastroesophageal reflux disease)   . Hemorrhoids   . History of hiatal hernia   . History  of ovarian cyst   . History of thyroid cancer    05-01-2011  s/p  total thyroidectomy for multinodular goiter and papillary carcinoma  . Hyperglycemia 03/01/2016  . Hyperlipidemia    borderline  . Hypothyroidism, postsurgical   . Internal hemorrhoids   . Lumbar spondylosis    L4 -- S1  . Osteoporosis    osteopenia  . Overweight 08/14/2009   Qualifier: Diagnosis of  By: Niel Hummer MD, Lorinda Creed   . Palpitations 03/01/2016  . PONV (postoperative nausea and vomiting)    nausea with pain meds  . TMJ (temporomandibular joint disorder)     Past Surgical History:  Procedure Laterality Date  . APPENDECTOMY  01-30-2008  . CARPAL TUNNEL RELEASE Right 08/30/2014   Procedure: RIGHT CARPAL TUNNEL RELEASE;  Surgeon: Linna Hoff, MD;  Location: [facility];  Service: Orthopedics;  Laterality: Right;  . COLONOSCOPY  08-08-2007  . HEMORRHOID SURGERY    . LAPAROSCOPIC RIGHT OOPHORECTOMY  1996  . LAPAROSCOPIC TUBAL LIGATION  07-21-2002   W/   Dilatation and evacuation  . LEFT SHOULDER CLOSED MANIPULATION/  ARTHROSCOPIC DEBRIDEMENT LABRUM/  SAD/ RELEASE  CA LIGAMENT/ BURSECTOMY  10-12-2011  . PARTIAL KNEE ARTHROPLASTY Right 12/09/2015   Procedure: RIGHT KNEE MEDIAL UNICOMPARTMENTAL ARTHROPLASTY ;  Surgeon: Gaynelle Arabian, MD;  Location: [facility];  Service: Orthopedics;  Laterality: Right;  . TOTAL THYROIDECTOMY  05-01-2011  . TRANSOBTURATOR SLING  01-07-2005   W/ CYSTOCELE REPAIR  .  UPPER GASTROINTESTINAL ENDOSCOPY  last one 06-29-2014    Family History  Problem Relation Age of Onset  . Diabetes Father   . Cancer Father        prostate  . Stroke Father        bacterial endocarditis after prostate surgery  . Heart disease Father        aortic valvular disease, MI  . Thyroid disease Mother        hyperparathyroidism  . Alzheimer's disease Mother   . Hyperparathyroidism Mother   . Atrial fibrillation Mother   . Hypertension Mother   . Hyperlipidemia Mother   . Thyroid  disease Maternal Grandmother   . Hyperlipidemia Brother   . Hypertension Brother   . Allergies Brother   . Heart disease Daughter        palpitations  . Esophageal cancer Neg Hx   . Colon cancer Neg Hx   . Colon polyps Neg Hx   . Rectal cancer Neg Hx   . Stomach cancer Neg Hx     Social History   Socioeconomic History  . Marital status: Married    Spouse name: Not on file  . Number of children: 2  . Years of education: Not on file  . Highest education level: Not on file  Occupational History  . Occupation: realtor  Tobacco Use  . Smoking status: Former Smoker    Years: 5.00    Types: Cigarettes    Start date: 09/28/1984    Quit date: 08/28/1985    Years since quitting: 34.7  . Smokeless tobacco: Never Used  Vaping Use  . Vaping Use: Never used  Substance and Sexual Activity  . Alcohol use: Yes    Comment: rarely  . Drug use: No  . Sexual activity: Not on file  Other Topics Concern  . Not on file  Social History Narrative  . Not on file   Social Determinants of Health   Financial Resource Strain:   . Difficulty of Paying Living Expenses: Not on file  Food Insecurity:   . Worried About Charity fundraiser in the Last Year: Not on file  . Ran Out of Food in the Last Year: Not on file  Transportation Needs:   . Lack of Transportation (Medical): Not on file  . Lack of Transportation (Non-Medical): Not on file  Physical Activity:   . Days of Exercise per Week: Not on file  . Minutes of Exercise per Session: Not on file  Stress:   . Feeling of Stress : Not on file  Social Connections:   . Frequency of Communication with Friends and Family: Not on file  . Frequency of Social Gatherings with Friends and Family: Not on file  . Attends Religious Services: Not on file  . Active Member of Clubs or Organizations: Not on file  . Attends Archivist Meetings: Not on file  . Marital Status: Not on file  Intimate Partner Violence:   . Fear of Current or  Ex-Partner: Not on file  . Emotionally Abused: Not on file  . Physically Abused: Not on file  . Sexually Abused: Not on file    Outpatient Medications Prior to Visit  Medication Sig Dispense Refill  . Calcium Citrate-Vitamin D (CITRACAL + D PO) Take 2 tablets by mouth daily. 1200mg     . escitalopram (LEXAPRO) 10 MG tablet Take 1 tablet by mouth daily.  2  . esomeprazole (NEXIUM) 20 MG capsule Take 1 capsule by mouth in  the morning and at bedtime.    Marnee [place] Omega-3 Krill Oil 350 MG CAPS Take 1 capsule by mouth daily.    . Probiotic Product (PROBIOTIC PO) Take 1 capsule by mouth as needed. Lucky Probiotic    . sucralfate (CARAFATE) 1 g tablet Take 1 tablet (1 g total) by mouth 2 (two) times daily as needed. 60 tablet 0  . SYNTHROID 100 MCG tablet TAKE 1 TABLET ONCE DAILY BEFORE BREAKFAST. 34 tablet 6  . Vitamin D, Cholecalciferol, 50 MCG (2000 UT) CAPS Take 1 capsule by mouth daily.    . rosuvastatin (CRESTOR) 5 MG tablet Take 5 mg by mouth every other day.     No facility-administered medications prior to visit.    Allergies  Allergen Reactions  . Celestone [Betamethasone Sodium Phosphate] Other (See Comments)    "Feels hot and looks sunburned"  . Droperidol Other (See Comments)    "feels like I have withdrawal symptoms"  . Omni-Pac [Cefdinir] Diarrhea and Other (See Comments)    Other Reaction: Other reaction  . Statins Other (See Comments)    "joint stiffness"  . Hydrocodone Itching and Rash    Review of Systems  Constitutional: Negative for fever and malaise/fatigue.  HENT: Negative for congestion.   Eyes: Negative for blurred vision.  Respiratory: Negative for shortness of breath.   Cardiovascular: Negative for chest pain, palpitations and leg swelling.  Gastrointestinal: Positive for abdominal pain and heartburn. Negative for blood in stool and nausea.  Genitourinary: Negative for dysuria and frequency.  Musculoskeletal: Negative for falls.  Skin: Negative for rash.    Neurological: Negative for dizziness, loss of consciousness and headaches.  Endo/Heme/Allergies: Negative for environmental allergies.  Psychiatric/Behavioral: Negative for depression. The patient is nervous/anxious.        Objective:    Physical Exam Constitutional:      Appearance: Normal appearance. She is not ill-appearing.  HENT:     Head: Normocephalic and atraumatic.     Right Ear: External ear normal.     Left Ear: External ear normal.  Eyes:     General:        Right eye: No discharge.        Left eye: No discharge.  Pulmonary:     Effort: Pulmonary effort is normal.  Neurological:     Mental Status: She is alert and oriented to person, place, and time.  Psychiatric:        Behavior: Behavior normal.     Wt 183 lb (83 kg)   BMI 29.54 kg/m  Wt Readings from Last 3 Encounters:  05/31/20 183 lb (83 kg)  05/28/20 185 lb (83.9 kg)  04/18/20 182 lb 6.4 oz (82.7 kg)    Diabetic Foot Exam - Simple   No data filed     Lab Results  Component Value Date   WBC 5.2 04/18/2020   HGB 13.9 04/18/2020   HCT 41.5 04/18/2020   PLT 248.0 04/18/2020   GLUCOSE 101 (H) 04/18/2020   CHOL 235 (H) 02/14/2020   TRIG 92.0 02/14/2020   HDL 53.00 02/14/2020   LDLDIRECT 212.6 08/10/2012   LDLCALC 164 (H) 02/14/2020   ALT 10 04/18/2020   AST 13 04/18/2020   NA 139 04/18/2020   K 4.7 04/18/2020   CL 101 04/18/2020   CREATININE 0.72 04/18/2020   BUN 17 04/18/2020   CO2 31 04/18/2020   TSH 2.16 02/14/2020   INR 1.10 12/03/2015   HGBA1C 5.7 02/14/2020    Lab Results  Component Value Date   TSH 2.16 02/14/2020   Lab Results  Component Value Date   WBC 5.2 04/18/2020   HGB 13.9 04/18/2020   HCT 41.5 04/18/2020   MCV 90.1 04/18/2020   PLT 248.0 04/18/2020   Lab Results  Component Value Date   NA 139 04/18/2020   K 4.7 04/18/2020   CO2 31 04/18/2020   GLUCOSE 101 (H) 04/18/2020   BUN 17 04/18/2020   CREATININE 0.72 04/18/2020   BILITOT 0.5 04/18/2020    ALKPHOS 99 04/18/2020   AST 13 04/18/2020   ALT 10 04/18/2020   PROT 7.0 04/18/2020   ALBUMIN 4.6 04/18/2020   CALCIUM 9.5 04/18/2020   ANIONGAP 8 12/11/2015   GFR 81.85 04/18/2020   Lab Results  Component Value Date   CHOL 235 (H) 02/14/2020   Lab Results  Component Value Date   HDL 53.00 02/14/2020   Lab Results  Component Value Date   LDLCALC 164 (H) 02/14/2020   Lab Results  Component Value Date   TRIG 92.0 02/14/2020   Lab Results  Component Value Date   CHOLHDL 4 02/14/2020   Lab Results  Component Value Date   HGBA1C 5.7 02/14/2020       Assessment & Plan:   Problem List Items Addressed This Visit    Postsurgical hypothyroidism (Chronic)    On Levothyroxine, continue to monitor. She has an appt with endocrinology next week.       Prediabetes (Chronic)    hgba1c acceptable, minimize simple carbs. Increase exercise as tolerated.       Acid reflux    She has a 5 cm Hiatal hernia and is under the care of Dr Bryan Lemma who is helping her to sort out which procedure she needs to control her symptoms. Her symptoms are some improved on current meds and with small meals.       Anxiety and depression    She was very tearful and anxious at her last visit. She feels much better and is taking her Escitalopram. She is in group therapy       Vitamin D deficiency    Supplement and monitor         I have changed Dala P. Reap "[name]"'s rosuvastatin. I am also having her maintain her escitalopram, Synthroid, sucralfate, Calcium Citrate-Vitamin D (CITRACAL + D PO), Vitamin D (Cholecalciferol), esomeprazole, MegaRed Omega-3 Krill Oil, and Probiotic Product (PROBIOTIC PO).  Meds ordered this encounter  Medications  . rosuvastatin (CRESTOR) 5 MG tablet    Sig: Take 1 tablet (5 mg total) by mouth every other day.    Dispense:  45 tablet    Refill:  1     I discussed the assessment and treatment plan with the patient. The patient was provided an opportunity to  ask questions and all were answered. The patient agreed with the plan and demonstrated an understanding of the instructions.   The patient was advised to call back or seek an in-person evaluation if the symptoms worsen or if the condition fails to improve as anticipated.  I provided 30 minutes of non-face-to-face time during this encounter.   Penni Homans, MD

## 2020-06-05 ENCOUNTER — Other Ambulatory Visit: Payer: Self-pay

## 2020-06-05 ENCOUNTER — Ambulatory Visit (AMBULATORY_SURGERY_CENTER): Payer: Managed Care, Other (non HMO) | Admitting: Gastroenterology

## 2020-06-05 ENCOUNTER — Encounter: Payer: Self-pay | Admitting: Gastroenterology

## 2020-06-05 ENCOUNTER — Ambulatory Visit: Payer: Managed Care, Other (non HMO) | Admitting: Gastroenterology

## 2020-06-05 VITALS — BP 121/74 | HR 67 | Temp 96.6°F | Resp 14 | Ht 66.0 in | Wt 185.0 lb

## 2020-06-05 DIAGNOSIS — K222 Esophageal obstruction: Secondary | ICD-10-CM

## 2020-06-05 DIAGNOSIS — K449 Diaphragmatic hernia without obstruction or gangrene: Secondary | ICD-10-CM | POA: Diagnosis not present

## 2020-06-05 DIAGNOSIS — K317 Polyp of stomach and duodenum: Secondary | ICD-10-CM

## 2020-06-05 DIAGNOSIS — K2270 Barrett's esophagus without dysplasia: Secondary | ICD-10-CM

## 2020-06-05 DIAGNOSIS — K219 Gastro-esophageal reflux disease without esophagitis: Secondary | ICD-10-CM

## 2020-06-05 MED ORDER — SODIUM CHLORIDE 0.9 % IV SOLN: 500.0000 mL | Freq: Once | INTRAVENOUS | Status: DC

## 2020-06-05 NOTE — Progress Notes (Signed)
pt tolerated well. VSS. awake and to recovery. Report given to RN.  

## 2020-06-05 NOTE — Op Note (Signed)
[facility] Patient Name: [name] Procedure Date: 06/05/2020 11:26 AM MRN: [id number] Endoscopist: Gerrit Heck , MD Age: 64 Referring MD:  Date of Birth: [birth year] Gender: Female Account #: [id number] Procedure:                Upper GI endoscopy Indications:              Esophageal reflux, Follow-up of Barrett's                            esophagus, Surveillance for malignancy due to                            personal history of Barrett's esophagus,                            Preoperative assessment                           64 year old female with longstanding history of                            reflux, has had several (at least 10) upper                            endoscopies since 1995, including short segment                            non-dysplastic Barrett's Esophagus diagnosed in                            2008. Most recetn endoscopies as below. She is                            interested in hiatal hernia repair and antireflux                            surgery as a means to better control her reflux and                            potentially stop acid suppression therapy.                           ?"EGD (10/2018): 5 cm HH, irregular Z-line with <1cm                            segment of nondysplastic Barrett's esophagus.                            Fundic gland polyps. Repeat 2025                           ?"EGD (06/2014): LA Grade A esophagitis, 1 cm                            segment of salmon-colored mucosa (biopsies:  Reflux                            changes without intestinal metaplasia), small                            hiatal hernia. Fundic polyps                           ?"EGD (11/2010): GEJ biopsies with reflux changes                            without intestinal metaplasia                           ?"EGD (07/2007): Irregular Z-line (GEJ biopsy with                            focal intestinal metaplasia c/w nondysplastic                             Barrett's), 3 cm HH. Gland polyps                           ?"EGD (04/2000, Dr. Sharlett Iles): 5-6 cm HH, Schatzki's                            ring dilated with 18 mm Savary Medicines:                Monitored Anesthesia Care Procedure:                Pre-Anesthesia Assessment:                           - Prior to the procedure, a History and Physical                            was performed, and patient medications and                            allergies were reviewed. The patient's tolerance of                            previous anesthesia was also reviewed. The risks                            and benefits of the procedure and the sedation                            options and risks were discussed with the patient.                            All questions were answered, and informed consent                            was obtained. Prior  Anticoagulants: The patient has                            taken no previous anticoagulant or antiplatelet                            agents. ASA Grade Assessment: II - A patient with                            mild systemic disease. After reviewing the risks                            and benefits, the patient was deemed in                            satisfactory condition to undergo the procedure.                           After obtaining informed consent, the endoscope was                            passed under direct vision. Throughout the                            procedure, the patient's blood pressure, pulse, and                            oxygen saturations were monitored continuously. The                            Endoscope was introduced through the mouth, and                            advanced to the second part of duodenum. The upper                            GI endoscopy was accomplished without difficulty.                            The patient tolerated the procedure well. Scope In: Scope Out: Findings:                  Esophagogastric landmarks were identified: the                            gastroesophageal junction was found at 35 cm and                            the site of hiatal narrowing was found at 40 cm                            from the incisors.                           A 5  cm hiatal hernia was present.                           The gastroesophageal flap valve was visualized                            endoscopically and classified as Hill Grade IV (no                            fold, wide open lumen, hiatal hernia present).                           One benign-appearing, intrinsic mild stenosis was                            found 34 cm from the incisors. This stenosis                            measured less than one cm (in length). The stenosis                            was traversed. A TTS dilator was passed through the                            scope. Dilation with an 18-19-20 mm balloon dilator                            was performed to 20 mm. The dilation site was                            examined and showed mild mucosal disruption.                            Estimated blood loss was minimal. This was then                            biopsied with a cold forceps for further fracturing                            of the ring. Estimated blood loss was minimal.                           Circumferential salmon-colored mucosa was present                            from 34 to 35 cm. No other visible abnormalities                            were present on white light and narrow band                            imaging. The maximum longitudinal extent of these  esophageal mucosal changes was 1 cm in length.                            Biopsies were taken with a cold forceps for                            histology. Estimated blood loss was minimal.                           Multiple 2 to 6 mm sessile polyps with no bleeding                            and no stigmata  of recent bleeding were found in                            the gastric fundus and in the gastric body. Several                            of these polyps were removed with a cold biopsy                            forceps for histologic representative evaluation.                            Resection and retrieval were complete. Estimated                            blood loss was minimal.                           The incisura, gastric antrum and pylorus were                            normal.                           The examined duodenum was normal. Complications:            No immediate complications. Estimated Blood Loss:     Estimated blood loss was minimal. Impression:               - Esophagogastric landmarks identified.                           - 5 cm hiatal hernia.                           - Gastroesophageal flap valve classified as Hill                            Grade IV (no fold, wide open lumen, hiatal hernia                            present).                           -  Benign-appearing esophageal stenosis. Dilated.                            Biopsied.                           - Salmon-colored mucosa suspicious for                            short-segment Barrett's esophagus. Biopsied.                           - Multiple gastric polyps. Resected and retrieved.                           - Normal incisura, antrum and pylorus.                           - Normal examined duodenum. Recommendation:           - Patient has a contact number available for                            emergencies. The signs and symptoms of potential                            delayed complications were discussed with the                            patient. Return to normal activities tomorrow.                            Written discharge instructions were provided to the                            patient.                           - Soft diet today, then advance slowly as tolerated                             tomorrow per post dilation protocol.                           - Continue present medications.                           - Await pathology results.                           - Will place referral to the surgeon at appointment                            to be scheduled to discuss laporoscopic hiatal                            hernia repair with concomitant Transoral  Incisionless Fundoplication (cTIF).                           - Follow-up with Dr. Bryan Lemma in the GI clinic at                            appointment to be scheduled following evaluation in                            the surgical clinic. Gerrit Heck, MD 06/05/2020 12:10:01 PM

## 2020-06-05 NOTE — Patient Instructions (Signed)
Dilation diet today - see handout for instructions.  Follow up in the office with Dr. Bryan Lemma at appointment to be scheduled.  Await pathology results.   YOU HAD AN ENDOSCOPIC PROCEDURE TODAY AT [facility]:   Refer to the procedure report that was given to you for any specific questions about what was found during the examination.  If the procedure report does not answer your questions, please call your gastroenterologist to clarify.  If you requested that your care partner not be given the details of your procedure findings, then the procedure report has been included in a sealed envelope for you to review at your convenience later.  YOU SHOULD EXPECT: Some feelings of bloating in the abdomen. Passage of more gas than usual.  Walking can help get rid of the air that was put into your GI tract during the procedure and reduce the bloating. If you had a lower endoscopy (such as a colonoscopy or flexible sigmoidoscopy) you may notice spotting of blood in your stool or on the toilet paper. If you underwent a bowel prep for your procedure, you may not have a normal bowel movement for a few days.  Please Note:  You might notice some irritation and congestion in your nose or some drainage.  This is from the oxygen used during your procedure.  There is no need for concern and it should clear up in a day or so.  SYMPTOMS TO REPORT IMMEDIATELY:    Following upper endoscopy (EGD)  Vomiting of blood or coffee ground material  New chest pain or pain under the shoulder blades  Painful or persistently difficult swallowing  New shortness of breath  Fever of 100F or higher  Black, tarry-looking stools  For urgent or emergent issues, a gastroenterologist can be reached at any hour by calling [phone]. Do not use MyChart messaging for urgent concerns.    DIET:  We do recommend a small meal at first, but then you may proceed to your regular diet.  Drink plenty of fluids but you  should avoid alcoholic beverages for 24 hours.  ACTIVITY:  You should plan to take it easy for the rest of today and you should NOT DRIVE or use heavy machinery until tomorrow (because of the sedation medicines used during the test).    FOLLOW UP: Our staff will call the number listed on your records 48-72 hours following your procedure to check on you and address any questions or concerns that you may have regarding the information given to you following your procedure. If we do not reach you, we will leave a message.  We will attempt to reach you two times.  During this call, we will ask if you have developed any symptoms of COVID 19. If you develop any symptoms (ie: fever, flu-like symptoms, shortness of breath, cough etc.) before then, please call [phone].  If you test positive for Covid 19 in the 2 weeks post procedure, please call and report this information to [place].    If any biopsies were taken you will be contacted by phone or by letter within the next 1-3 weeks.  Please call us at [phone] if you have not heard about the biopsies in 3 weeks.    SIGNATURES/CONFIDENTIALITY: You and/or your care partner have signed paperwork which will be entered into your electronic medical record.  These signatures attest to the fact that that the information above on your After Visit Summary has been reviewed and is understood.  Full responsibility of the confidentiality of this discharge information lies with you and/or your care-partner.

## 2020-06-05 NOTE — Progress Notes (Signed)
Pt's states no medical or surgical changes since previsit or office visit.  HC - vitals

## 2020-06-05 NOTE — Progress Notes (Signed)
Called to room to assist during endoscopic procedure.  Patient ID and intended procedure confirmed with present staff. Received instructions for my participation in the procedure from the performing physician.  

## 2020-06-06 ENCOUNTER — Ambulatory Visit: Payer: Managed Care, Other (non HMO) | Admitting: Gastroenterology

## 2020-06-07 ENCOUNTER — Telehealth: Payer: Self-pay

## 2020-06-07 NOTE — Telephone Encounter (Signed)
  Follow up Call-  Call back number 06/05/2020 11/11/2018  Post procedure Call Back phone  # [phone]  Permission to leave phone message Yes Yes  Some recent data might be hidden     Patient questions:  Do you have a fever, pain , or abdominal swelling? No. Pain Score  0 *  Have you tolerated food without any problems? Yes.    Have you been able to return to your normal activities? Yes.    Do you have any questions about your discharge instructions: Diet   No. Medications  No. Follow up visit  No.  Do you have questions or concerns about your Care? No.  Actions: * If pain score is 4 or above: No action needed, pain <4.  1. Have you developed a fever since your procedure? no  2.   Have you had an respiratory symptoms (SOB or cough) since your procedure? no  3.   Have you tested positive for COVID 19 since your procedure no  4.   Have you had any family members/close contacts diagnosed with the COVID 19 since your procedure?  no   If yes to any of these questions please route to Joylene John, RN and Joella Prince, RN

## 2020-06-10 ENCOUNTER — Encounter: Payer: Self-pay | Admitting: Gastroenterology

## 2020-06-13 ENCOUNTER — Telehealth: Payer: Self-pay | Admitting: Gastroenterology

## 2020-06-13 NOTE — Telephone Encounter (Signed)
This is  a Dr Silverio Decamp patient who has treated patient in the past for this issue. She only saw Dr Bryan Lemma to discus TIF procedure

## 2020-06-13 NOTE — Telephone Encounter (Signed)
Patient states that she's having brb with every bm no pain. Would like to know what to do. States that in the beginning of summer she was told she had anal fissure.  Please advised

## 2020-06-14 ENCOUNTER — Other Ambulatory Visit: Payer: Self-pay

## 2020-06-14 MED ORDER — AMBULATORY NON FORMULARY MEDICATION: 1 refills | Status: AC

## 2020-06-14 NOTE — Telephone Encounter (Signed)
Patient contacted. She has had soft but large bowel movements for the past week. She had one day that she cleaned after the bowel movement "a little hard" and there was some blood. Since then, she has had increasing amounts of blood with the bowel movement, after the bowel movement and on the tissue. No seepage. No pain. She has begun using the compounded nitro ointment from 04/27/2019. Is this okay?

## 2020-06-14 NOTE — Telephone Encounter (Signed)
New Rx to [facility].  Called the patient to tell her. No answer. Mailbox is full and cannot accept new messages.

## 2020-06-14 NOTE — Telephone Encounter (Signed)
Ok to use NTG small pea size twice daily for 4-6 weeks. Miralax 1 capful daily as needed. Please schedule follow up office visit, next available with me. Thanks

## 2020-06-17 NOTE — Telephone Encounter (Signed)
Spoke with the patient today. She is doing better and has not seen blood. Appointment scheduled. She will call us if she needs [place] sooner.

## 2020-06-21 ENCOUNTER — Other Ambulatory Visit: Payer: Self-pay | Admitting: General Surgery

## 2020-06-21 DIAGNOSIS — K449 Diaphragmatic hernia without obstruction or gangrene: Secondary | ICD-10-CM

## 2020-06-21 DIAGNOSIS — K219 Gastro-esophageal reflux disease without esophagitis: Secondary | ICD-10-CM

## 2020-06-24 ENCOUNTER — Telehealth: Payer: Self-pay | Admitting: Gastroenterology

## 2020-06-24 NOTE — Telephone Encounter (Signed)
Pt is requesting a call back from a nurse to inform her about a surgery she is going to be having in [place].

## 2020-06-25 NOTE — Telephone Encounter (Signed)
Spoke to patient. She will come by our office to sign a medical release form that will be sent to the the Doctor in [place].

## 2020-06-25 NOTE — Telephone Encounter (Signed)
Spoke with patient. She said she called the wrong practice.

## 2020-06-26 ENCOUNTER — Ambulatory Visit
Admission: RE | Admit: 2020-06-26 | Discharge: 2020-06-26 | Disposition: A | Discharge status: Home / Self Care | Payer: Managed Care, Other (non HMO) | Source: Ambulatory Visit | Attending: General Surgery | Admitting: General Surgery

## 2020-06-26 ENCOUNTER — Telehealth: Payer: Self-pay

## 2020-06-26 ENCOUNTER — Other Ambulatory Visit: Payer: Self-pay | Admitting: General Surgery

## 2020-06-26 ENCOUNTER — Other Ambulatory Visit: Payer: Self-pay | Admitting: Gastroenterology

## 2020-06-26 DIAGNOSIS — K219 Gastro-esophageal reflux disease without esophagitis: Secondary | ICD-10-CM

## 2020-06-26 DIAGNOSIS — K449 Diaphragmatic hernia without obstruction or gangrene: Secondary | ICD-10-CM

## 2020-06-26 NOTE — Telephone Encounter (Signed)
Spoke to patient to inform her that a referral has been sent to Dr muthusamy's office along with her medical records. She will contact our office to keep [place] updated on her up coming cTIF procedure. All questions answered. Patient voiced understanding.

## 2020-06-26 NOTE — Telephone Encounter (Signed)
-----  Message from Lavena Bullion, DO sent at 06/25/2020  4:42 PM EDT ----- Regarding: RE: medical referral Claiborne Billings, Thanks for the information and helping Ms Manger in the coordination of her ongoing care.  I certainly understand the need to be near family in the postoperative recovery period.  Dr. Narda Amber is a recognized advanced endoscopist who regularly performs TIF at a very robust program at [facility], so she will undoubtedly be in good hands there.  As long as she is getting the care that she needs, it is perfectly okay with me to send a referral directly to Dr. Narda Amber for cTIF and assist in getting her records to him for review.  We will remain available to her when she returns to the area.  Thank you.  I will update Dr. Redmond Pulling on the change in plans.Dr. Loletha Grayer ----- Message ----- From: Angie Fava, LPN Sent: 2/29/7989   3:37 PM EDT To: Lavena Bullion, DO Subject: medical referral                               Dr Bryan Lemma, [place] saw this patient and recommended a hernia repair with TIF. She saw Dr Redmond Pulling and it was determined that she would need both procedures. Patient needs this done before the end of the year,she has met her deductible and insurance will cover the cost. Here's the problem.. She does not have family in [place] to care for her post op. She has called on her kids who live in [place] to help out. She has decided to have the procedure with Dr Royston Cowper at [facility] . Patient has a [place] visit with him in a few weeks ( He will have all her medical records by then).She thinks He performs both the hernia and TIF procedure. The patient will stay in [place] until her post op follow up appointment,and is cleared to  go home. Can you please  let me know if it is okay to send in the referral for Dr Narda Amber for the combined procedure.  Thanks, [employer]

## 2020-06-27 ENCOUNTER — Telehealth: Payer: PRIVATE HEALTH INSURANCE

## 2020-06-28 NOTE — Telephone Encounter
Radar- Pt aware records received and will call to schedule once reviewed and authorized

## 2020-07-02 ENCOUNTER — Telehealth: Payer: PRIVATE HEALTH INSURANCE

## 2020-07-02 NOTE — Telephone Encounter
Pt called and will have CD of EUS sent over for Dr. Caro Laroche to review. She would like for [place] to call her once received.

## 2020-07-09 ENCOUNTER — Ambulatory Visit: Payer: PRIVATE HEALTH INSURANCE

## 2020-07-09 ENCOUNTER — Inpatient Hospital Stay: Payer: PRIVATE HEALTH INSURANCE

## 2020-07-09 DIAGNOSIS — R1310 Dysphagia, unspecified: Secondary | ICD-10-CM

## 2020-07-09 DIAGNOSIS — K219 Gastro-esophageal reflux disease without esophagitis: Secondary | ICD-10-CM

## 2020-07-09 DIAGNOSIS — Z719 Counseling, unspecified: Secondary | ICD-10-CM

## 2020-07-22 ENCOUNTER — Ambulatory Visit (INDEPENDENT_AMBULATORY_CARE_PROVIDER_SITE_OTHER): Payer: Managed Care, Other (non HMO) | Admitting: Gastroenterology

## 2020-07-22 ENCOUNTER — Encounter: Payer: Self-pay | Admitting: Gastroenterology

## 2020-07-22 VITALS — BP 102/78 | HR 75 | Ht 66.0 in | Wt 186.0 lb

## 2020-07-22 DIAGNOSIS — K449 Diaphragmatic hernia without obstruction or gangrene: Secondary | ICD-10-CM

## 2020-07-22 DIAGNOSIS — K640 First degree hemorrhoids: Secondary | ICD-10-CM

## 2020-07-22 DIAGNOSIS — K219 Gastro-esophageal reflux disease without esophagitis: Secondary | ICD-10-CM | POA: Diagnosis not present

## 2020-07-22 DIAGNOSIS — K625 Hemorrhage of anus and rectum: Secondary | ICD-10-CM | POA: Diagnosis not present

## 2020-07-22 DIAGNOSIS — K602 Anal fissure, unspecified: Secondary | ICD-10-CM

## 2020-07-22 DIAGNOSIS — K2270 Barrett's esophagus without dysplasia: Secondary | ICD-10-CM

## 2020-07-22 NOTE — Patient Instructions (Signed)
Continue to use pea size amount three times a day of Nitroglycerin ointment  Take Benifiber 1- teaspoon three times a day  Follow up in 6 months  I appreciate the  opportunity to care for you  Thank You   Harl Bowie , MD

## 2020-07-22 NOTE — Progress Notes (Signed)
[name]    [id number]    1956/07/30  Primary Care Physician:Blyth, Bonnita Levan, MD  Referring Physician: Mosie Lukes, MD [address] [place],  [place] [address]   Chief complaint: Rectal bleeding HPI:  64 year old very pleasant female with history of chronic GERD,  short segment of Barrett's esophagus here for follow up visit complaints of rectal bleeding She saw Dr. Bryan Lemma to discuss possible antireflux surgical options and also saw Dr.Wilson [[facility]]  She is planning to undergo hernia repair and antireflux procedure in [place], currently her kids are in [place] and she feels she has better support system there and she also feels more comfortable with the surgeon /surgical team there  She is concerned about vit D Deficiency with long-term PPI use.   She feels swallowing has improved after balloon dilation in September 2021  She had an episode of rectal bleeding, denies excessive straining or rectal discomfort.  No constipation.  She has not had any further episodes in the past few days  Denies any nausea, vomiting, abdominal pain or melena   H/o symptomatic hemorrhoids status post hemorrhoidal band [id number] 2017  Colonoscopy June 29, 2017 left-sided diverticulosis, mild erythema, superficial ulcerated mucosa in distal rectum biopsied, showed normal mucosa with no evidence of inflammation or proctitis. Small nonbleeding internal hemorrhoids  EGD November 11, 2018:5 cm hiatal hernia, irregular Z line with less than 1 cm mucosal changes suggestive of Barrett's esophagus Gastric fundic polyps otherwise unremarkable exam  EGD June 05, 2020 by Dr. Bryan Lemma: Ultrashort segment of salmon-pink mucosa.  5 cm hiatal hernia and benign mild stricture s/p dilation with TTS balloon to 20 mm   Outpatient Encounter Medications as of 07/22/2020  Medication Sig  . AMBULATORY NON FORMULARY MEDICATION Medication Name: Nitroglycerin compound  0.125% apply pea size amount into rectum twice daily x 4 to 6 weeks  . Calcium Citrate-Vitamin D (CITRACAL + D PO) Take 2 tablets by mouth daily. 1200mg   . escitalopram (LEXAPRO) 10 MG tablet Take 1 tablet by mouth daily.  Marland Kitchen esomeprazole (NEXIUM) 20 MG capsule Take 1 capsule by mouth in the morning and at bedtime.  Marnee Spring Omega-3 Krill Oil 350 MG CAPS Take 1 capsule by mouth daily.  . Probiotic Product (PROBIOTIC PO) Take 1 capsule by mouth as needed. Lucky Probiotic  . rosuvastatin (CRESTOR) 5 MG tablet Take 1 tablet (5 mg total) by mouth every other day.  . sucralfate (CARAFATE) 1 g tablet Take 1 tablet (1 g total) by mouth 2 (two) times daily as needed.  Marland Kitchen SYNTHROID 100 MCG tablet TAKE 1 TABLET ONCE DAILY BEFORE BREAKFAST.  Marland Kitchen Vitamin D, Cholecalciferol, 50 MCG (2000 UT) CAPS Take 1 capsule by mouth daily.  . [DISCONTINUED] esomeprazole (NEXIUM) 40 MG capsule TAKE 1 CAPSULE (40 MG) BY MOUTH 2 TIMES DAILY BEFORE MEAL   No facility-administered encounter medications on file as of 07/22/2020.    Allergies as of 07/22/2020 - Review Complete 07/22/2020  Allergen Reaction Noted  . Celestone [betamethasone sodium phosphate] Other (See Comments) 10/12/2011  . Droperidol Other (See Comments) 10/12/2011  . Omni-pac [cefdinir] Diarrhea and Other (See Comments) 08/28/2014  . Statins Other (See Comments) 08/28/2014  . Hydrocodone Itching and Rash 11/22/2015    Past Medical History:  Diagnosis Date  . Acute upper respiratory infection 09/13/2016  . Allergic rhinitis   . Allergy   . Anxiety state 08/24/2016  . Barrett's esophagus without dysplasia  pt says was suspected never confirmed 9 or 10 years ago  . Cancer [facility])    thyroid cancer  . Depression   . Esophagitis   . Fundic gland polyps of stomach, benign   . GERD (gastroesophageal reflux disease)   . Hemorrhoids   . History of hiatal hernia   . History of ovarian cyst   . History of thyroid cancer    05-01-2011  s/p  total  thyroidectomy for multinodular goiter and papillary carcinoma  . Hyperglycemia 03/01/2016  . Hyperlipidemia    borderline  . Hypothyroidism, postsurgical   . Internal hemorrhoids   . Lumbar spondylosis    L4 -- S1  . Osteoporosis    osteopenia  . Overweight 08/14/2009   Qualifier: Diagnosis of  By: Niel Hummer MD, Lorinda Creed   . Palpitations 03/01/2016  . PONV (postoperative nausea and vomiting)    nausea with pain meds  . TMJ (temporomandibular joint disorder)     Past Surgical History:  Procedure Laterality Date  . APPENDECTOMY  01-30-2008  . CARPAL TUNNEL RELEASE Right 08/30/2014   Procedure: RIGHT CARPAL TUNNEL RELEASE;  Surgeon: Linna Hoff, MD;  Location: [facility];  Service: Orthopedics;  Laterality: Right;  . COLONOSCOPY  08-08-2007  . HEMORRHOID SURGERY    . LAPAROSCOPIC RIGHT OOPHORECTOMY  1996  . LAPAROSCOPIC TUBAL LIGATION  07-21-2002   W/   Dilatation and evacuation  . LEFT SHOULDER CLOSED MANIPULATION/  ARTHROSCOPIC DEBRIDEMENT LABRUM/  SAD/ RELEASE  CA LIGAMENT/ BURSECTOMY  10-12-2011  . PARTIAL KNEE ARTHROPLASTY Right 12/09/2015   Procedure: RIGHT KNEE MEDIAL UNICOMPARTMENTAL ARTHROPLASTY ;  Surgeon: Gaynelle Arabian, MD;  Location: [facility];  Service: Orthopedics;  Laterality: Right;  . TOTAL THYROIDECTOMY  05-01-2011  . TRANSOBTURATOR SLING  01-07-2005   W/ CYSTOCELE REPAIR  . UPPER GASTROINTESTINAL ENDOSCOPY  last one 06-29-2014    Family History  Problem Relation Age of Onset  . Diabetes Father   . Cancer Father        prostate  . Stroke Father        bacterial endocarditis after prostate surgery  . Heart disease Father        aortic valvular disease, MI  . Thyroid disease Mother        hyperparathyroidism  . Alzheimer's disease Mother   . Hyperparathyroidism Mother   . Atrial fibrillation Mother   . Hypertension Mother   . Hyperlipidemia Mother   . Thyroid disease Maternal Grandmother   . Hyperlipidemia Brother   . Hypertension  Brother   . Allergies Brother   . Heart disease Daughter        palpitations  . Esophageal cancer Neg Hx   . Colon cancer Neg Hx   . Colon polyps Neg Hx   . Rectal cancer Neg Hx   . Stomach cancer Neg Hx     Social History   Socioeconomic History  . Marital status: Married    Spouse name: Not on file  . Number of children: 2  . Years of education: Not on file  . Highest education level: Not on file  Occupational History  . Occupation: realtor  Tobacco Use  . Smoking status: Former Smoker    Years: 5.00    Types: Cigarettes    Start date: 09/28/1984    Quit date: 08/28/1985    Years since quitting: 34.9  . Smokeless tobacco: Never Used  Vaping Use  . Vaping Use: Never used  Substance and Sexual Activity  .  Alcohol use: Yes    Comment: rarely  . Drug use: No  . Sexual activity: Not on file  Other Topics Concern  . Not on file  Social History Narrative  . Not on file   Social Determinants of Health   Financial Resource Strain:   . Difficulty of Paying Living Expenses: Not on file  Food Insecurity:   . Worried About Charity fundraiser in the Last Year: Not on file  . Ran Out of Food in the Last Year: Not on file  Transportation Needs:   . Lack of Transportation (Medical): Not on file  . Lack of Transportation (Non-Medical): Not on file  Physical Activity:   . Days of Exercise per Week: Not on file  . Minutes of Exercise per Session: Not on file  Stress:   . Feeling of Stress : Not on file  Social Connections:   . Frequency of Communication with Friends and Family: Not on file  . Frequency of Social Gatherings with Friends and Family: Not on file  . Attends Religious Services: Not on file  . Active Member of Clubs or Organizations: Not on file  . Attends Archivist Meetings: Not on file  . Marital Status: Not on file  Intimate Partner Violence:   . Fear of Current or Ex-Partner: Not on file  . Emotionally Abused: Not on file  . Physically Abused:  Not on file  . Sexually Abused: Not on file      Review of systems: All other review of systems negative except as mentioned in the HPI.   Physical Exam: Vitals:   07/22/20 1539  BP: 102/78  Pulse: 75   Body mass index is 30.02 kg/m. Gen:      No acute distress HEENT:  sclera anicteric Abd:      soft, non-tender; no palpable masses, no distension Ext:    No edema Neuro: alert and oriented x 3 Psych: normal mood and affect Rectal exam: Normal anal sphincter tone, no external hemorrhoids Anoscopy: Small internal hemorrhoids, small left lateral anal fissure, no active bleeding, normal dentate line, no visible nodules Data Reviewed:  Reviewed labs, radiology imaging, old records and pertinent past GI work up   Assessment and Plan/Recommendations:  64 year old very pleasant female with history of chronic GERD, hiatal hernia and ultrashort segment of Barrett's esophagus  Small-volume rectal bleeding likely secondary to anal fissure  Visualized small anal fissure in left lateral position on anoscopy Use nitroglycerin small pea-sized amount 3 times daily per rectum for 4 to 6 weeks Benefiber 1 teaspoon 3 times daily with meals  Internal hemorrhoids are small grade 1, do not recommend additional hemorrhoidal banding at this point  GERD and hiatal hernia: Continue antireflux measures Continue Nexium He is planning to undergo antireflux surgery and hernia repair in [place], has a virtual appointment coming up with the surgeon to discuss options   This visit required >30 minutes of patient care (this includes precharting, chart review, review of results, face-to-face time used for counseling as well as treatment plan and follow-up. The patient was provided an opportunity to ask questions and all were answered. The patient agreed with the plan and demonstrated an understanding of the instructions.  Damaris Hippo , MD    CC: Mosie Lukes, MD

## 2020-07-26 ENCOUNTER — Encounter: Payer: Self-pay | Admitting: Gastroenterology

## 2020-08-04 NOTE — Progress Notes
Outpatient Interventional Gastroenterology Telemedicine Consult   ATTENDING: Jill Side, MD  PATIENT: Alice Saunders  MRN: [id number]  DOB: [birth year]  DATE OF SERVICE: 08/06/2020  REFERRING PROVIDER: Doristine Locks, MD  PRIMARY CARE PROVIDER: No primary care provider on file.     REASON FOR REFERRAL/CHIEF COMPLAINT: GERD, hiatal hernia;  TIF evaluation     History of Present Illness:  Alice Saunders is a 64 y.o. female with history of papillar thyroid carcinoma s/p thyroidectomy, GERD and hiatal hernia who presents to GI clinic via telemedicine visit to discuss heartburn and TIF evaluation.     Patient has a longstanding history of acid reflux and heartburn with multiple prior endoscopies since 1995. She previously had an EGD in November 2008 that demonstrated nondysplastic Barrett's esophagus (reportedly ''ultrashort'' segment, report not available), but evaluations since then have been negative for Barrett's esophagus.     She last underwent EGD on 06/05/20 (Dr. Doristine Locks, [facility] [place]) which showed GEJ at 35 cm, diaphagrmatic pinch at 40 cm, 5 cm hiatal hernia, Hill grade IV valve. Mild intrinsic stenosis was found 34 cm from incisors that dilated with 18-19-20 mm balloon dilator with mild mucosal disruption. There was circumferential salmon colored mucosa from 34-35 cm that was biopsied (chronic inflammation, no Barrett's esophagus). Lastly, there were multiple gastric sessile polyps 2-6 mm in size that were removed (fundic gland polyps).     She has been taking esomeprazole 20 mg twice daily about 30 minutes prior to breakfast and dinner. Prior to this, she has trial other PPIs including Prilosec and Prevacid without response. She reports esomeprazole has led to overall control of her heartburn and reflux, but she states any changes in the timing or missing a dose will lead to significant heartburn. She otherwise denies F/C, N/V, abdominal pain, cough.    Her concerns about esomeprazole is having to time it around other medications (such as Synthroid) and the potential risk for osteoporosis. [place], and she stated she had met with local GI (Dr. Barron Alvine) and surgeon about combined TIF and hernia repair. However, she decided she would rather obtain another opinion at [facility], as her children live in the area and she has seen my talk on the internet about this procedure.     Past Medical & Surgical History:  Thyroid cancer  Depression  Esophagitis  GERD  Hiatal Hernia  Osteoporosis  Prior thyroidectomy    Relevant Family History:    []   Family history of colorectal cancer    []   Family history of GI cancer (not CRC)   []   Family history of IBD   []   Family history of celiac disease    No relevant family history of GI malignancy.     Relevant Social History:    Lives in [place], [place].  Denies any significant history of smoking or alcohol use.    MEDICATIONS:     No current outpatient medications on file.     No current facility-administered medications for this visit.     Allergies : Not on File    PHYSICAL EXAM   There were no vitals filed for this visit.   There is no height or weight on file to calculate BMI.    Patient was seen via Video Visit. She appeared well-nourished and in no acute distress. Her responses to questions were appropriate.      Lab Review:  CBC 04/18/20: Hgb 13.9, Plt 248K, WBC 5.2  Chem 04/18/20: Na  139, K 4.7, AST 13, ALT 10, ALP 99, Bili 0.5    Imaging/GI Studies:    EGD 06/05/20                      VISIT DIAGNOSES/PROBLEMS ADDRESSED ON 08/06/2020:     Encounter Diagnoses   Name Primary?   ??? Gastroesophageal reflux disease, unspecified whether esophagitis present Yes   ??? Hiatal hernia           Medical Decision Making addressed on 08/06/2020:      Number and Complexity of Problems Addressed at the Encounter:   Level 4:   [x]   1 or more chronic illness with exacerbation, progression, or side effects of treatment  []   2 or more stable chronic illness  []   1 undiagnosed new problem with uncertain diagnosis  []   1 acute illness with systemic symptoms  []   1 acute complicated injury   Level 5:   []   1 or more chronic illness with severe exacerbation, progression, or side effects of treatment  []   1 acute or chronic illness or injury that poses a threat to life or bodily function     Review of Data: I have (select 1 out of 3 categories for level 4; 2 out of 3 categories for level 5)  [x]  Reviewed/ordered []  1 []  2 [x]  ? 3 unique laboratory, radiology, and/or diagnostic tests noted below   [x]  I have reviewed tests, documents or independent historian(s):  [x]  Reviewed/ordered ? 3 unique laboratory, radiology, and/or diagnostic tests noted  (EGD x 3, pathology, labs, ordered EGD with Bravo and manometry study)   []  Reviewed prior external notes and incorporated into patient assessment as noted  []  I have independently interpreted test performed by other physician(s) as noted   []  Discussed management or test interpretation with external provider(s) as noted       Risk of Complication and or Morbidity or Mortality of Patient Management including Social Determinants of Health:   [x]   I deem the above diagnoses to have a risk of complication, morbidity or mortality of []  Minimal   []  Low (3)     [x]  Moderate (4)   []  Severe (5)  []  The diagnosis or treatment of said conditions is significantly limited by social determinants of health as noted.     Assessment & Plan :   Alice Saunders is a 64 y.o. female with history of papillar thyroid carcinoma s/p thyroidectomy, GERD and hiatal hernia who presents to GI clinic via telemedicine visit to discuss heartburn and TIF evaluation.     Patient has longstanding history of acid reflux and heartburn symptoms. While her symptoms are controlled with PPI therapy, she wishes to explore anti-reflux endoscopic and surgical interventions given difficulty with timing PPI and her concerns about developing osteoporosis in the future. She would benefit from further evaluations of her hernia and objective evidence for GERD. The reported hiatal hernia of 5 cm from outside EGD support that she will need a hernia repair with possible TIF (if she is a candidate), and given the hernia size, would have to consider repair with surgical fundoplication.     Recommendations  - Plan for EGD with 96-hour Bravo pH study and esophageal manometry. (48 hrs off meds; 48 hrs on meds)  - We will refer to Dr. Kennith Center [facility]) to evaluate if hernia repair combined with TIF vs surgical fundoplication would be better option for her given size of her hiatal hernia.  -  Continue PPI twice daily, advised to take 15-30 minutes prior to breakfast and dinner.    The above plan/recommendation(s) were discussed with the patient during a video telehealth visit.  The patient had all questions answered satisfactorily and is in agreement with this recommended plan of care.    Patient was seen by and discussed with GI attending, Dr. Caro Laroche, as a telehealth video visit.     Selinda Eon MD  [place] [facility] Fellow  08/06/2020 4:06 PM     I, Dr. Caro Laroche, have seen the patient as a video telehealth visit  and agree with the summary and plan as detailed above.      55 min were spent on this patient visit included physician face-to-face time, preparing to see the patient, counseling and educating patient/family/caregiver, ordering medications/tests/procedures, referring and communicating with other healthcare professionals, documenting clinical information in the EHR and independently interpreting results and communicating results to patient/family/caregiver.      New patient: 99203 30-44 min, 19147 45-59 min, [id number] 60-74 min  Established patient: [id number] 20-29 min, 21308 30-39 min, 65784 40-55 min    Patient Consent to Telehealth   The patient agreed to participate in the video visit prior to joining the visit.

## 2020-08-06 ENCOUNTER — Telehealth: Payer: PRIVATE HEALTH INSURANCE | Attending: Gastroenterology

## 2020-08-06 DIAGNOSIS — K219 Gastro-esophageal reflux disease without esophagitis: Secondary | ICD-10-CM

## 2020-08-07 ENCOUNTER — Other Ambulatory Visit: Payer: Self-pay | Admitting: Gastroenterology

## 2020-08-08 ENCOUNTER — Ambulatory Visit: Payer: PRIVATE HEALTH INSURANCE

## 2020-08-08 DIAGNOSIS — K449 Diaphragmatic hernia without obstruction or gangrene: Secondary | ICD-10-CM

## 2020-08-08 DIAGNOSIS — K219 Gastro-esophageal reflux disease without esophagitis: Secondary | ICD-10-CM

## 2020-08-10 ENCOUNTER — Other Ambulatory Visit: Payer: Self-pay | Admitting: Gastroenterology

## 2020-08-10 NOTE — Addendum Note
Addended by: Selinda Eon LEE on: 08/09/2020 08:26 PM     Modules accepted: Orders

## 2020-08-14 ENCOUNTER — Ambulatory Visit: Payer: PRIVATE HEALTH INSURANCE

## 2020-08-19 ENCOUNTER — Inpatient Hospital Stay: Payer: PRIVATE HEALTH INSURANCE | Attending: Gastroenterology

## 2020-08-19 DIAGNOSIS — K219 Gastro-esophageal reflux disease without esophagitis: Secondary | ICD-10-CM

## 2020-08-20 ENCOUNTER — Ambulatory Visit: Payer: PRIVATE HEALTH INSURANCE | Attending: Gastroenterology

## 2020-08-20 ENCOUNTER — Ambulatory Visit: Payer: PRIVATE HEALTH INSURANCE

## 2020-08-20 ENCOUNTER — Telehealth: Payer: PRIVATE HEALTH INSURANCE

## 2020-08-20 DIAGNOSIS — K219 Gastro-esophageal reflux disease without esophagitis: Secondary | ICD-10-CM

## 2020-08-20 NOTE — Consults
PATIENT:Alice Saunders  FAO:[id number]  DOB: Nov 18, 1955  DATE OF SERVICE: 08/20/20    GENERAL SURGERY/SURGICAL ONCOLOGY OUTPATIENT CONSULT (TELEMEDICIEN)    Patient Consent to Telehealth   The patient agreed to participate in the video visit prior to joining the visit.      REFERRING MD: Dr. Caro Laroche    CHIEF COMPLAINT: acid reflux    HPI:     Alice Saunders is a 64 y.o. female with a long-standing history of GERD (at least 25 years) presents for a surgical consultation. She is being evaluated for a hybrid laparoscopic hiatal hernia repair and transoral incisionless fundoplication.     Patient reports a long-standing acid reflux history. Has recently undergone anti-reflux surgery evaluation in [place], but decided to have this done at [facility] (her son and daughter live in [place] area). Her recent endoscopy reveals a 5 cm hiatal hernia and chronic esophagitis in the distal esophagus without e/o metaplasia/dysplasia/malignancy.     Past Medical History:   Diagnosis Date   ??? Barrett's esophagus    ??? Depression    ??? Esophagitis    ??? Gastric polyps    ??? GERD (gastroesophageal reflux disease)    ??? Hiatal hernia    ??? HLD (hyperlipidemia)    ??? Hypothyroidism    ??? Osteoporosis    ??? Papillary thyroid carcinoma (HCC/RAF)    ??? TMJ (dislocation of temporomandibular joint)        Past Surgical History:   Procedure Laterality Date   ??? THYROIDECTOMY         No current outpatient medications on file.     No current facility-administered medications for this visit.        No Known Allergies    No family history on file.     Social History     Socioeconomic History   ??? Marital status: Legally Separated     Spouse name: Not on file   ??? Number of children: Not on file   ??? Years of education: Not on file   ??? Highest education level: Not on file   Occupational History   ??? Not on file   Tobacco Use   ??? Smoking status: Not on file   ??? Smokeless tobacco: Not on file   Substance and Sexual Activity   ??? Alcohol use: Not on file   ??? Drug use: Not on file   ??? Sexual activity: Not on file   Other Topics Concern   ??? Not on file   Social History Narrative   ??? Not on file     Social Determinants of Health     Financial Resource Strain: Not on file   Physical Activity: Not on file   Stress: Not on file        REVIEW OF SYSTEMS:  A 14-point review of system was done during the current visit and is negative except for the pertinent positives described in the HPI and past medical history.      Objective       06/05/20 EGD    ???    ???    ???  ???           Assessment and Plan:     Alice Saunders is a 64 y.o. female with history as above presents with GERD with e/o esophagitis and a 5 cm hiatal hernia.  Patient is interested to proceed with a hybrid hiatal hernia repair and TIF. Further workup is pending per GI (EGD, manometry, pH  study - scheudled for 10/17/20).    - complete per-op evaluation per GI recs  - plan for a lap hiatal hernia repair and TIF  - pre-op per PCP    The telemedicine encounter was conducted along with the attending physician, Dr. Kennith Center, who agrees with assessment and plan listed above.    Marjory Lies, NP  General Surgery  [phone]      I have seen this patient and agree with the assessment and plan.

## 2020-08-21 ENCOUNTER — Ambulatory Visit: Payer: PRIVATE HEALTH INSURANCE

## 2020-08-21 DIAGNOSIS — K219 Gastro-esophageal reflux disease without esophagitis: Secondary | ICD-10-CM

## 2020-08-30 ENCOUNTER — Telehealth: Payer: Managed Care, Other (non HMO) | Admitting: Family Medicine

## 2020-09-18 ENCOUNTER — Ambulatory Visit: Payer: PRIVATE HEALTH INSURANCE

## 2020-09-18 DIAGNOSIS — Z01818 Encounter for other preprocedural examination: Secondary | ICD-10-CM

## 2020-09-24 ENCOUNTER — Inpatient Hospital Stay: Payer: PRIVATE HEALTH INSURANCE

## 2020-09-24 ENCOUNTER — Ambulatory Visit: Payer: PRIVATE HEALTH INSURANCE

## 2020-09-24 DIAGNOSIS — K219 Gastro-esophageal reflux disease without esophagitis: Secondary | ICD-10-CM

## 2020-10-03 ENCOUNTER — Telehealth (INDEPENDENT_AMBULATORY_CARE_PROVIDER_SITE_OTHER): Payer: Managed Care, Other (non HMO) | Admitting: Family Medicine

## 2020-10-03 ENCOUNTER — Other Ambulatory Visit: Payer: Self-pay

## 2020-10-03 VITALS — Wt 177.0 lb

## 2020-10-03 DIAGNOSIS — E782 Mixed hyperlipidemia: Secondary | ICD-10-CM

## 2020-10-03 DIAGNOSIS — K449 Diaphragmatic hernia without obstruction or gangrene: Secondary | ICD-10-CM

## 2020-10-03 DIAGNOSIS — E559 Vitamin D deficiency, unspecified: Secondary | ICD-10-CM

## 2020-10-03 DIAGNOSIS — E890 Postprocedural hypothyroidism: Secondary | ICD-10-CM | POA: Diagnosis not present

## 2020-10-03 DIAGNOSIS — R7303 Prediabetes: Secondary | ICD-10-CM | POA: Diagnosis not present

## 2020-10-03 DIAGNOSIS — R002 Palpitations: Secondary | ICD-10-CM

## 2020-10-06 NOTE — Assessment & Plan Note (Signed)
She has chosen to have her large, symptomatic hernia repaired at [facility] as that is where her daughter is who can care for her after her surgery

## 2020-10-06 NOTE — Assessment & Plan Note (Signed)
Supplement and monitor 

## 2020-10-06 NOTE — Assessment & Plan Note (Addendum)
Encouraged heart healthy diet, increase exercise, avoid trans fats, consider a krill oil cap daily, tolerating Rosuvastatin 

## 2020-10-06 NOTE — Assessment & Plan Note (Signed)
Check labs 

## 2020-10-06 NOTE — Progress Notes (Signed)
Virtual Visit via Video Note  I connected with [name] on 10/03/20 at  3:00 PM EST by a video enabled telemedicine application and verified that I am speaking with the correct person using two identifiers.  Location: Patient: home, patient and provider in visit Provider: office   I discussed the limitations of evaluation and management by telemedicine and the availability of in person appointments. The patient expressed understanding and agreed to proceed. S Chism was able to get the patient set up on a video visit   Subjective:    Patient ID: [name], female    DOB: 11-30-1955, 65 y.o.   MRN: [id number]  Chief Complaint  Patient presents with  . Follow-up    HPI Patient is in today for follow up on chronic medical concerns. No recent febrile illness or hospitalizations. No acute concerns but she is preparing to leave for [place] to have her surgical correction of her symptomatic, hiatal hernia. Her daughter lives near there and will help to care for her after surgery. She is worried about having to stop PPI and H2 blocker prior to surgery but can use Tums, Rolaids etc. Denies CP/palp/SOB/HA/congestion/fevers/GI or GU c/o. Taking meds as prescribed. She is apprehensive regarding surgery but otherwise doing well with her anxiety.   Past Medical History:  Diagnosis Date  . Acute upper respiratory infection 09/13/2016  . Allergic rhinitis   . Allergy   . Anxiety state 08/24/2016  . Barrett's esophagus without dysplasia    pt says was suspected never confirmed 9 or 10 years ago  . Cancer [facility])    thyroid cancer  . Depression   . Esophagitis   . Fundic gland polyps of stomach, benign   . GERD (gastroesophageal reflux disease)   . Hemorrhoids   . History of hiatal hernia   . History of ovarian cyst   . History of thyroid cancer    05-01-2011  s/p  total thyroidectomy for multinodular goiter and papillary carcinoma  . Hyperglycemia 03/01/2016  .  Hyperlipidemia    borderline  . Hypothyroidism, postsurgical   . Internal hemorrhoids   . Lumbar spondylosis    L4 -- S1  . Osteoporosis    osteopenia  . Overweight 08/14/2009   Qualifier: Diagnosis of  By: Alphonzo Severance MD, Loni Dolly   . Palpitations 03/01/2016  . PONV (postoperative nausea and vomiting)    nausea with pain meds  . TMJ (temporomandibular joint disorder)     Past Surgical History:  Procedure Laterality Date  . APPENDECTOMY  01-30-2008  . CARPAL TUNNEL RELEASE Right 08/30/2014   Procedure: RIGHT CARPAL TUNNEL RELEASE;  Surgeon: Sharma Covert, MD;  Location: [facility];  Service: Orthopedics;  Laterality: Right;  . COLONOSCOPY  08-08-2007  . HEMORRHOID SURGERY    . LAPAROSCOPIC RIGHT OOPHORECTOMY  1996  . LAPAROSCOPIC TUBAL LIGATION  07-21-2002   W/   Dilatation and evacuation  . LEFT SHOULDER CLOSED MANIPULATION/  ARTHROSCOPIC DEBRIDEMENT LABRUM/  SAD/ RELEASE  CA LIGAMENT/ BURSECTOMY  10-12-2011  . PARTIAL KNEE ARTHROPLASTY Right 12/09/2015   Procedure: RIGHT KNEE MEDIAL UNICOMPARTMENTAL ARTHROPLASTY ;  Surgeon: Ollen Gross, MD;  Location: [facility];  Service: Orthopedics;  Laterality: Right;  . TOTAL THYROIDECTOMY  05-01-2011  . TRANSOBTURATOR SLING  01-07-2005   W/ CYSTOCELE REPAIR  . UPPER GASTROINTESTINAL ENDOSCOPY  last one 06-29-2014    Family History  Problem Relation Age of Onset  . Diabetes Father   . Cancer Father  prostate  . Stroke Father        bacterial endocarditis after prostate surgery  . Heart disease Father        aortic valvular disease, MI  . Thyroid disease Mother        hyperparathyroidism  . Alzheimer's disease Mother   . Hyperparathyroidism Mother   . Atrial fibrillation Mother   . Hypertension Mother   . Hyperlipidemia Mother   . Thyroid disease Maternal Grandmother   . Hyperlipidemia Brother   . Hypertension Brother   . Allergies Brother   . Heart disease Daughter        palpitations  . Esophageal  cancer Neg Hx   . Colon cancer Neg Hx   . Colon polyps Neg Hx   . Rectal cancer Neg Hx   . Stomach cancer Neg Hx     Social History   Socioeconomic History  . Marital status: Married    Spouse name: Not on file  . Number of children: 2  . Years of education: Not on file  . Highest education level: Not on file  Occupational History  . Occupation: realtor  Tobacco Use  . Smoking status: Former Smoker    Years: 5.00    Types: Cigarettes    Start date: 09/28/1984    Quit date: 08/28/1985    Years since quitting: 35.1  . Smokeless tobacco: Never Used  Vaping Use  . Vaping Use: Never used  Substance and Sexual Activity  . Alcohol use: Yes    Comment: rarely  . Drug use: No  . Sexual activity: Not on file  Other Topics Concern  . Not on file  Social History Narrative  . Not on file   Social Determinants of Health   Financial Resource Strain: Not on file  Food Insecurity: Not on file  Transportation Needs: Not on file  Physical Activity: Not on file  Stress: Not on file  Social Connections: Not on file  Intimate Partner Violence: Not on file    Outpatient Medications Prior to Visit  Medication Sig Dispense Refill  . AMBULATORY NON FORMULARY MEDICATION Medication Name: Nitroglycerin compound 0.125% apply pea size amount into rectum twice daily x 4 to 6 weeks 30 g 1  . Calcium Citrate-Vitamin D (CITRACAL + D PO) Take 2 tablets by mouth daily. 1200mg     . escitalopram (LEXAPRO) 10 MG tablet Take 1 tablet by mouth daily.  2  . esomeprazole (NEXIUM) 20 MG capsule TAKE 1 CAPSULE TWICE DAILY BEFORE MEALS 60 capsule 0  . esomeprazole (NEXIUM) 40 MG capsule TAKE 1 CAPSULE (40 MG) BY MOUTH 2 TIMES DAILY BEFORE MEAL 60 capsule 0  . MegaRed Omega-3 Krill Oil 350 MG CAPS Take 1 capsule by mouth daily.    . Probiotic Product (PROBIOTIC PO) Take 1 capsule by mouth as needed. Lucky Probiotic    . rosuvastatin (CRESTOR) 5 MG tablet Take 1 tablet (5 mg total) by mouth every other day. 45  tablet 1  . sucralfate (CARAFATE) 1 g tablet Take 1 tablet (1 g total) by mouth 2 (two) times daily as needed. 60 tablet 0  . SYNTHROID 100 MCG tablet TAKE 1 TABLET ONCE DAILY BEFORE BREAKFAST. 34 tablet 6  . Vitamin D, Cholecalciferol, 50 MCG (2000 UT) CAPS Take 1 capsule by mouth daily.     No facility-administered medications prior to visit.    Allergies  Allergen Reactions  . Celestone [Betamethasone Sodium Phosphate] Other (See Comments)    "Feels hot and looks  sunburned"  . Droperidol Other (See Comments)    "feels like I have withdrawal symptoms"  . Omni-Pac [Cefdinir] Diarrhea and Other (See Comments)    Other Reaction: Other reaction  . Statins Other (See Comments)    "joint stiffness"  . Hydrocodone Itching and Rash    Review of Systems  Constitutional: Negative for fever and malaise/fatigue.  HENT: Negative for congestion.   Eyes: Negative for blurred vision.  Respiratory: Negative for shortness of breath.   Cardiovascular: Negative for chest pain, palpitations and leg swelling.  Gastrointestinal: Positive for abdominal pain, heartburn and nausea. Negative for blood in stool, constipation and diarrhea.  Genitourinary: Negative for dysuria and frequency.  Musculoskeletal: Negative for falls.  Skin: Negative for rash.  Neurological: Negative for dizziness, loss of consciousness and headaches.  Endo/Heme/Allergies: Negative for environmental allergies.  Psychiatric/Behavioral: Negative for depression. The patient is not nervous/anxious.        Objective:    Physical Exam Constitutional:      Appearance: Normal appearance. She is not ill-appearing.  HENT:     Head: Normocephalic and atraumatic.     Right Ear: There is no impacted cerumen.     Left Ear: There is no impacted cerumen.     Nose: Nose normal.  Eyes:     General:        Right eye: No discharge.        Left eye: No discharge.  Pulmonary:     Effort: Pulmonary effort is normal.  Musculoskeletal:      Cervical back: Neck supple.  Neurological:     Mental Status: She is alert and oriented to person, place, and time.  Psychiatric:        Behavior: Behavior normal.     Wt 177 lb (80.3 kg)   BMI 28.57 kg/m  Wt Readings from Last 3 Encounters:  10/03/20 177 lb (80.3 kg)  07/22/20 186 lb (84.4 kg)  06/05/20 185 lb (83.9 kg)    Diabetic Foot Exam - Simple   No data filed    Lab Results  Component Value Date   WBC 5.2 04/18/2020   HGB 13.9 04/18/2020   HCT 41.5 04/18/2020   PLT 248.0 04/18/2020   GLUCOSE 101 (H) 04/18/2020   CHOL 235 (H) 02/14/2020   TRIG 92.0 02/14/2020   HDL 53.00 02/14/2020   LDLDIRECT 212.6 08/10/2012   LDLCALC 164 (H) 02/14/2020   ALT 10 04/18/2020   AST 13 04/18/2020   NA 139 04/18/2020   K 4.7 04/18/2020   CL 101 04/18/2020   CREATININE 0.72 04/18/2020   BUN 17 04/18/2020   CO2 31 04/18/2020   TSH 2.16 02/14/2020   INR 1.10 12/03/2015   HGBA1C 5.7 02/14/2020    Lab Results  Component Value Date   TSH 2.16 02/14/2020   Lab Results  Component Value Date   WBC 5.2 04/18/2020   HGB 13.9 04/18/2020   HCT 41.5 04/18/2020   MCV 90.1 04/18/2020   PLT 248.0 04/18/2020   Lab Results  Component Value Date   NA 139 04/18/2020   K 4.7 04/18/2020   CO2 31 04/18/2020   GLUCOSE 101 (H) 04/18/2020   BUN 17 04/18/2020   CREATININE 0.72 04/18/2020   BILITOT 0.5 04/18/2020   ALKPHOS 99 04/18/2020   AST 13 04/18/2020   ALT 10 04/18/2020   PROT 7.0 04/18/2020   ALBUMIN 4.6 04/18/2020   CALCIUM 9.5 04/18/2020   ANIONGAP 8 12/11/2015   GFR 81.85 04/18/2020  Lab Results  Component Value Date   CHOL 235 (H) 02/14/2020   Lab Results  Component Value Date   HDL 53.00 02/14/2020   Lab Results  Component Value Date   LDLCALC 164 (H) 02/14/2020   Lab Results  Component Value Date   TRIG 92.0 02/14/2020   Lab Results  Component Value Date   CHOLHDL 4 02/14/2020   Lab Results  Component Value Date   HGBA1C 5.7 02/14/2020        Assessment & Plan:   Problem List Items Addressed This Visit    Postsurgical hypothyroidism - Primary (Chronic)    Check labs       Relevant Orders   TSH   T4, free   T3, free   Prediabetes (Chronic)   Relevant Orders   Comprehensive metabolic panel   Hemoglobin A1c   Mixed hyperlipidemia    Encouraged heart healthy diet, increase exercise, avoid trans fats, consider a krill oil cap daily, tolerating Rosuvastatin      Relevant Orders   Lipid panel   Diaphragmatic hernia    She has chosen to have her large, symptomatic hernia repaired at [facility] as that is where her daughter is who can care for her after her surgery      Palpitations   Relevant Orders   CBC   TSH   Vitamin D deficiency    Supplement and monitor      Relevant Orders   VITAMIN D 25 Hydroxy (Vit-D Deficiency, Fractures)      I am having Kathleen P. Sword "[name]" maintain her escitalopram, Synthroid, sucralfate, Calcium Citrate-Vitamin D (CITRACAL + D PO), vitamin D3, MegaRed Omega-3 Krill Oil, Probiotic Product (PROBIOTIC PO), rosuvastatin, AMBULATORY NON FORMULARY MEDICATION, esomeprazole, and esomeprazole.  No orders of the defined types were placed in this encounter.   I discussed the assessment and treatment plan with the patient. The patient was provided an opportunity to ask questions and all were answered. The patient agreed with the plan and demonstrated an understanding of the instructions.   The patient was advised to call back or seek an in-person evaluation if the symptoms worsen or if the condition fails to improve as anticipated.  I provided 20 minutes of non-face-to-face time during this encounter.   Penni Homans, MD

## 2020-10-07 ENCOUNTER — Other Ambulatory Visit (INDEPENDENT_AMBULATORY_CARE_PROVIDER_SITE_OTHER): Payer: Managed Care, Other (non HMO)

## 2020-10-07 DIAGNOSIS — E782 Mixed hyperlipidemia: Secondary | ICD-10-CM

## 2020-10-07 DIAGNOSIS — R7303 Prediabetes: Secondary | ICD-10-CM | POA: Diagnosis not present

## 2020-10-07 DIAGNOSIS — E890 Postprocedural hypothyroidism: Secondary | ICD-10-CM

## 2020-10-07 DIAGNOSIS — R002 Palpitations: Secondary | ICD-10-CM

## 2020-10-07 DIAGNOSIS — E559 Vitamin D deficiency, unspecified: Secondary | ICD-10-CM | POA: Diagnosis not present

## 2020-10-07 LAB — CBC
HCT: 42.1 % (ref 36.0–46.0)
Hemoglobin: 14.2 g/dL (ref 12.0–15.0)
MCHC: 33.7 g/dL (ref 30.0–36.0)
MCV: 89.1 fl (ref 78.0–100.0)
Platelets: 247 10*3/uL (ref 150.0–400.0)
RBC: 4.73 Mil/uL (ref 3.87–5.11)
RDW: 13.4 % (ref 11.5–15.5)
WBC: 5.7 10*3/uL (ref 4.0–10.5)

## 2020-10-07 LAB — COMPREHENSIVE METABOLIC PANEL
ALT: 12 U/L (ref 0–35)
AST: 14 U/L (ref 0–37)
Albumin: 4.6 g/dL (ref 3.5–5.2)
Alkaline Phosphatase: 92 U/L (ref 39–117)
BUN: 15 mg/dL (ref 6–23)
CO2: 29 mEq/L (ref 19–32)
Calcium: 9.1 mg/dL (ref 8.4–10.5)
Chloride: 104 mEq/L (ref 96–112)
Creatinine, Ser: 0.75 mg/dL (ref 0.40–1.20)
GFR: 84.79 mL/min (ref >60.00)
Glucose, Bld: 104 mg/dL — ABNORMAL HIGH (ref 70–99)
Potassium: 4.5 mEq/L (ref 3.5–5.1)
Sodium: 140 mEq/L (ref 135–145)
Total Bilirubin: 0.4 mg/dL (ref 0.2–1.2)
Total Protein: 7 g/dL (ref 6.0–8.3)

## 2020-10-07 LAB — LIPID PANEL
Cholesterol: 193 mg/dL (ref 0–200)
HDL: 58 mg/dL (ref >39.00)
LDL Cholesterol: 117 mg/dL — ABNORMAL HIGH (ref 0–99)
NonHDL: 134.99
Total CHOL/HDL Ratio: 3
Triglycerides: 92 mg/dL (ref 0.0–149.0)
VLDL: 18.4 mg/dL (ref 0.0–40.0)

## 2020-10-07 LAB — T4, FREE: Free T4: 1.13 ng/dL (ref 0.60–1.60)

## 2020-10-07 LAB — HEMOGLOBIN A1C: Hgb A1c MFr Bld: 5.9 % (ref 4.6–6.5)

## 2020-10-07 LAB — TSH: TSH: 0.96 u[IU]/mL (ref 0.35–4.50)

## 2020-10-07 LAB — VITAMIN D 25 HYDROXY (VIT D DEFICIENCY, FRACTURES): VITD: 55.05 ng/mL (ref 30.00–100.00)

## 2020-10-07 LAB — T3, FREE: T3, Free: 3 pg/mL (ref 2.3–4.2)

## 2020-10-09 ENCOUNTER — Ambulatory Visit: Payer: PRIVATE HEALTH INSURANCE

## 2020-10-10 ENCOUNTER — Other Ambulatory Visit: Payer: Self-pay | Admitting: Internal Medicine

## 2020-10-11 ENCOUNTER — Ambulatory Visit: Payer: PRIVATE HEALTH INSURANCE

## 2020-10-16 ENCOUNTER — Institutional Professional Consult (permissible substitution): Payer: PRIVATE HEALTH INSURANCE

## 2020-10-16 ENCOUNTER — Ambulatory Visit: Payer: PRIVATE HEALTH INSURANCE

## 2020-10-16 DIAGNOSIS — Z01818 Encounter for other preprocedural examination: Secondary | ICD-10-CM

## 2020-10-16 NOTE — H&P
GASTROENTEROLOGY PRE-PROCEDURE HISTORY AND PHYSICAL    Indication for Procedure:     Procedure: [x]  EGD   []  Colonoscopy  []  Enteroscopy  []  Flexible Sigmoidoscopy                         []  ERCP  []  EUS  []  Other:    Level of sedation intended for procedure:  []  Moderate  []  Deep  [x]  MAC  []  Anesthesia   Informed Consent Obtained Including Risks, Benefits & Alternatives:  [x]  Yes    Informed Consent Obtained For Sedation Including Risks, Benefits & Alternatives: [x]  Yes  BP 126/89  ~ Pulse 78  ~ Resp (!) 0  ~ SpO2 94%      History of Present Illness  Alice Saunders is a 65 y.o. female with longstanding history of acid reflux and heartburn symptoms, reluctant to continue long term PPI therapy. Presents for EGD, bravo, and manometry for TIF planning.     Past Medical History  Past Medical History:   Diagnosis Date   ??? Barrett's esophagus    ??? Depression    ??? Esophagitis    ??? Gastric polyps    ??? GERD (gastroesophageal reflux disease)    ??? Hiatal hernia    ??? HLD (hyperlipidemia)    ??? Hypothyroidism    ??? Osteoporosis    ??? Papillary thyroid carcinoma (HCC/RAF)    ??? TMJ (dislocation of temporomandibular joint)        Past Surgical History  Past Surgical History:   Procedure Laterality Date   ??? THYROIDECTOMY         Medications  See medication list    Allergies  Patient has no known allergies.    Family History  No family history on file.    Social History  Social History     Tobacco Use   ??? Smoking status: Not on file   ??? Smokeless tobacco: Not on file   Substance Use Topics   ??? Alcohol use: Not on file   ??? Drug use: Not on file       Review of Systems  A complete ROS was performed, pertinent positives and negative are in the HPI, remainder of 14 systems is negative    Physical Examination  Vital Signs: BP 126/89  ~ Pulse 78  ~ Resp (!) 0  ~ SpO2 94%   Gen: No acute distress, answers questions appropriately.  HEENT: Anicteric sclera, oropharynx clear, moist mucous membranes  Neck: Trachea midline, good range of motion  Pulm: Clear to auscultation bilaterally, no wheezes, rales, or rhonchi  CV: Regular, no murmurs, gallops or rubs  Abd: Soft, nontender, nondistended, no rebound or guarding  Ext: No peripheral edema, clubbing, or cyanosis   Neuro: Alert and fully oriented, no focal neurologic deficits    Laboratory Data  No results found for: WBC, HGB, HCT, MCV, PLT    No results found for: CREAT, BUN, NA, K, CL, CO2    No results found for: ALT, AST, GGT, ALKPHOS, BILITOT     Impression:  1. GERD  Recommendations  1.  Proceed with procedure.      ASA Classification: ASA 2 - Patient with mild systemic disease with no functional limitations    I have reviewed the history and physical and have determined Alice Saunders to be an appropriate candidate to undergo the planned procedure with sedation and analgesia.    Shanda Bumps Lesbia Ottaway   10/17/2020 12:43 PM

## 2020-10-17 ENCOUNTER — Inpatient Hospital Stay
Admit: 2020-10-17 | Discharge: 2020-10-18 | Disposition: A | Discharge status: Home / Self Care | Payer: PRIVATE HEALTH INSURANCE | Source: Home / Self Care | Attending: Gastroenterology

## 2020-10-17 ENCOUNTER — Ambulatory Visit: Payer: PRIVATE HEALTH INSURANCE

## 2020-10-17 DIAGNOSIS — K449 Diaphragmatic hernia without obstruction or gangrene: Secondary | ICD-10-CM

## 2020-10-17 DIAGNOSIS — K21 Gastroesophageal reflux disease with esophagitis without hemorrhage: Secondary | ICD-10-CM

## 2020-10-17 DIAGNOSIS — K317 Polyp of stomach and duodenum: Secondary | ICD-10-CM

## 2020-10-17 DIAGNOSIS — K219 Gastro-esophageal reflux disease without esophagitis: Secondary | ICD-10-CM

## 2020-10-17 DIAGNOSIS — Z01818 Encounter for other preprocedural examination: Secondary | ICD-10-CM

## 2020-10-17 LAB — COVID-19 PCR

## 2020-10-17 MED ADMIN — PLASMA-LYTE A IV SOLN: INTRAVENOUS | @ 23:00:00 | Stop: 2020-10-18 | NDC 00338022104

## 2020-10-17 MED ADMIN — PROPOFOL 200 MG/20ML IV EMUL: INTRAVENOUS | @ 23:00:00 | Stop: 2020-10-18

## 2020-10-17 MED ADMIN — ONDANSETRON HCL 4 MG/2ML IJ SOLN: INTRAVENOUS | @ 23:00:00 | Stop: 2020-10-18 | NDC 60505613005

## 2020-10-17 MED ADMIN — LIDOCAINE HCL (CARDIAC) 100 MG/5ML IV SOSY: INTRAVENOUS | @ 23:00:00 | Stop: 2020-10-18 | NDC 76329339001

## 2020-10-17 MED ADMIN — PROPOFOL 200 MG/20ML IV EMUL: INTRAVENOUS | @ 23:00:00 | Stop: 2020-10-18 | NDC 63323026929

## 2020-10-17 NOTE — Procedures
A 65 year old female came today for a 96 hr Bravo placement off / on medication through EGD.      Patient has been experiencing the following symptoms: Heartburn, regurgitation, Belching.  Patient takes Nexium antiacid medication. Patient stopped taken antiacid medication 7 days ago prior to the test as instructed.     Patient verbalized understanding of the Bravo study including understanding of placement, recording device, care instructions for the recording device, and all discharge instructions re: diary-keeping & when to return diary & recorder.  The Bravo recorder used SN [id number].  The Bravo used was ID# [id number], Lot [id number], and expires 08/21/21.       Dr. Caro Laroche performed an EGD and the z-line was noted to be at 35cm cm.  The Bravo was then placed by provider to a depth of 29cm from incisors.  Suction to 80 mm of Hg was applied for 60 seconds.  The location of the Bravo capsule was verified by reintroduction of EGD scope.  Start time for the Bravo pH study was 1504 and starting pH was 3.6.     Patient tolerated procedure well.

## 2020-10-17 NOTE — Procedures
PATIENT NAME:       Alice Saunders, Alice Saunders  DATE OF BIRTH:       [birth year]  RECORD NUMBER:      [id number]  DATE/TIME OF PROCEDURE:     10/17/2020 / 01:15:00 PM  ENDOSCOPIST:       Jill Side, MD  REFERRING PHYSICIAN:      Simonne Come MD  Self Referral MD  FELLOW:       Maia Plan, MD    INDICATIONS FOR EXAMINATION:      65 y.o. female with longstanding history of acid reflux and heartburn symptoms, reluctant to continue long term PPI therapy. Presents for EGD and bravo for TIF planning.               PROCEDURE PERFORMED:             UPPER GI ENDOSCOPY - biopsy  UPPER GI ENDOSCOPY - Bravo pH catheter placement    MEDICATIONS:    MAC    PROCEDURE TECHNIQUE:  Informed consent obtained for the procedure including risks, benefits and alternatives and the risk of those alternatives. Informed consent obtained for sedation including risks, benefits and alternatives and the risk of those alternatives.    EKG, pulse, pulse oximetry and blood pressure were monitored throughout the procedure.     The Olympus upper endoscope was passed with ease through the mouth under direct visualization; it was extended into the 2nd portion of the duodenum. The scope was withdrawn and the mucosa of the esophagus, stomach and duodenum in the bulb and 2nd   duodenum was carefully examined. The views were excellent. The patient's toleration of the procedure was excellent.  Retroflexion was performed in the stomach to better assess the cardia and fundus.     Bravo probe placement: Measurements were taken of the z-line and gastroesophageal junction (both at 35 cm). The Bravo system was prepared and appropriately marked. The catheter and device were passed into the mouth and esophagus without difficulty under   visualization with the endoscope. Suction was applied for 45 seconds then the capsule deployed and confirmed to be in proper position by endoscopy with distal end at 29 cm and proximal end at 26 cm. The catheter and scope were then removed.    Total Withdrawl Time:   Total Insertion Time:                                 EXTENT OF EXAM:  2nd part of the duodenum            INSTRUMENTS:   [id number] #[id number]  TECHNICAL DIFFICULTY:  No   LIMITATIONS:      none  TOLERANCE:   Good  VISUALIZATION:  Good    FINDINGS:   Esophagus: There was LA Grade B esophagitis from 31 cm up to 35 cm. The diaphragmatic pinch was at 38 cm. The top of gastric folds and the z-line were at 35 cm. Consistent with hiatal hernia 3 cm in axial length. There was no Barrett's esophagus. After   completion of the EGD, the Bravo pH probe was deployed and confirmed endoscopically with the distal tip 6 cm above the GEJ at 29 cm and proximal end at 26 cm from the incisors. The vocal cords appeared normal.     Stomach: The stomach appeared to be normal except for gastric cardia polyps (ranging 2mm to 8 mm in size, likely fundic gland), the largest  2-3 were biopsied.  No evidence of ulcers, erosions. The pylorus was patent. On retroflexion no additional   findings were noted other than a Hill Grade IV valve.      Duodenum: The duodenum appeared to be normal to the 2nd portion.    ESTIMATED BLOOD LOSS:   None     DIAGNOSIS:  1. LA Grade B esophagitis from GEJ at 31 cm to 35 cm  2. Gastric cardia polyps, largest 2-3 were biopsied   3. Bravo pH probe placed to evaluate for acid reflux with distal tip at 29 cm, 6 cm above the GEJ  4. Hill Grade IV valve with hiatal hernia 3 cm in axial length.   5. O/w normal EGD    RECOMMENDATIONS:  1. Follow the instructions provided for recording of symptoms and use of acid suppressive medications for the Bravo pH monitor (First 48 hours OFF all acid suppressive therapy, then 2nd 48 hrs on medications)  2. Follow up biopsy results          This electronic signature authenticates all electronic and/or handwritten documentation, including orders, generated by the signer during the episode of care contained in this record.  10/17/2020 03:46:16 PM By Jill Side MD

## 2020-10-17 NOTE — H&P
UPDATED H&P REQUIREMENT    For [place] [facility] and [facility]    WHAT IS THE STATUS OF THE PATIENT'S MOST CURRENT HISTORY AND PHYSICAL?   - The most current H&P was performed within the past 24 hours. No additional updated H&P documentation is necessary.     REFER TO MEDICAL STAFF POLICIES REGARDING PRE-PROCEDURE HISTORY AND PHYSICAL EXAMINATION AND UPDATED H&P REQUIREMENTS BELOW:    [facility] and [facility] Medical Staff Policy 200 - For Patients Undergoing Procedures Requiring Moderate or Deep Sedation, General Anesthesia or Regional Anesthesia    Contents of a History and Physical Examination (H&P):    The H&P shall consist of chief complaint, history of present illness, allergies and medications, relevant social and family history, past medical history, review of systems and physical examination, and assessment and plan appropriate to the patient's age.    For Patients Undergoing Procedures Requiring Moderate or Deep Sedation, General Anesthesia or Regional Anesthesia:    1. An H&P shall be performed within 24 hours prior to the procedure by a qualified member of the medical staff or designee with appropriate privileges, except as noted in item 2 below.    2. If a complete history and physical was performed within thirty (30) calendar days prior to the patient's admission to the Medical Center for elective surgery, a member of the medical staff assumes the responsibility for the accuracy of the clinical information and will need to document in the medical record within twenty-four (24) hours of admission and prior to surgery or major invasive procedure, that they either attest that the history and physical has been reviewed and accepted, or document an update of the original history and physical relevant to the patient's current clinical status.    3. Providing an H&P for  patients undergoing surgery under local anesthesia is at the discretion of the Attending Physician.     4. When a procedure is performed by a dentist, podiatrist or other practitioner who is not privileged to perform an H&P, the anesthesiologist's assessment immediately prior to the procedure will constitute the 24 hour re-assessment.The dentist, podiatrist or other practitioner who is not privileged to perform an H&P will document the history and physical relevant to the procedure.    5. If the H&P and the written informed consent for the surgery or procedure are not recorded in the patient's medical record prior to surgery, the operation shall not be performed unless the attending physician states in writing that such a delay could lead to an adverse event or irreversible damage to the patient.    6. The above requirements shall not preclude the rendering of emergency medical or surgical care to a patient in dire circumstances.

## 2020-10-17 NOTE — Procedures
A 65 year old female presents for esophageal manometry with impedance exam. Her chief complaints include ***. These symptoms have started *** years ago and have improved or worsened with ***.  Patient verbalized understanding of the esophageal manometry with impedance study after pre-op teaching. This includes preparation of nostril, purpose and placement of manometry catheter, and liquids given in both supine and upright positions. After preparing the *** nostril with Lidocaine 2%, the 36 channel high resolution esophageal manometry catheter with SN# [id number]*** by [employer] was passed, with ease, to the depth of *** cm. The catheter was secured and the [facility] was lowered to 30 degrees.   After obtaining the 30 second ''landmark'' measurement, the patient was given ten 5 cc saline swallows every 20 seconds, followed by ten 5 cc viscous swallows every 20 seconds. The [facility] was raised to 90 degrees, and the patient was given five 5 cc saline swallows every 20 seconds, followed by five small bites of crackers. The catheter was withdrawn partially to *** cm, and the patient was given five 5 cc saline swallows every 7 seconds, followed by five 5 cc viscous swallows every 7 seconds. The catheter was withdrawn fully and carefully.  Patient tolerated the procedure well.

## 2020-10-18 LAB — Tissue Exam

## 2020-10-22 NOTE — Procedures
Bravo 96-hour Intraesophageal pH Study   [facility]  [address] [place], [address]  [place], [place] [address]    Date of service: 10/21/2020    Indications/Symptoms: GERD  Medications:   Medications that the patient states to be currently taking   Medication Sig   ??? cholecalciferol 10 mcg (400 units) tablet Take 5,000 mcg by mouth daily.   ??? escitalopram 10 mg tablet Take 10 mg by mouth daily.   ??? esomeprazole 40 mg capsule Take 40 mg by mouth every morning before breakfast.   ??? levothyroxine (SYNTHROID) 100 mcg tablet Take 100 mcg by mouth daily.      Referring physician: Lanice Shirts*  Nurse/Technician: Jenetta Loges      Procedure:  The patient was NPO for at least 6 hours prior to the procedure.  The patient was brought to the GI motility unit and told of the reasons for the procedure.   After manometrically identifying the lower esophageal sphincter, the Bravo pH catheter was passed transorally to position the pH sensor 6 cm above the upper border of the lower esophageal sphincter. A suction of 550 mmHg was applied to the catheter for 30 seconds.  The pH capsule was successfully deployed, and the catheter was removed. The patient was asked to keep a 96-hour diary indicating the times eating started and stopped, when symptoms occurred, when medications were taken and when upright or supine.  The patient was instructed to go about life as usual.    The study was done off acid suppressing medications for the first 2 days and then on acid suppression for the second 2 days.    Results:  Day 1:   Total Upright Supine   Duration (hh.mm) 22:28 12:31 9:57   # of refluxes 36 29 7   % time pH <4.0 12.6 (nl < 5.3) 20.3 (nl < 6.9) 2.9 (nl < 6.7)   Johnson-DeMeester Score 38.2 (Normal < 14.7)    Day 2:   Total Upright Supine   Duration (hh.mm) 22:35 13:10 9:25   # of refluxes 80 63 17   % time pH <4.0 12.9 (nl < 5.3) 12.1 (nl < 6.9) 13.9 (nl < 6.7)   Johnson-DeMeester Score 47.6 (Normal < 14.7)    Day 3:   Total Upright Supine   Duration (hh.mm) 22:49 12:24 10:25   # of refluxes 31 29 2    % time pH <4.0 1.6 (nl < 5.3) 4.5 (nl < 6.9) 0.4 (nl < 6.7)   Johnson-DeMeester Score 8.8 (Normal < 14.7)    Day 4:   Total Upright Supine   Duration (hh.mm) 22:06 12:50 9:16   # of refluxes 4 4 0   % time pH <4.0 0.1 (nl < 5.3) 0.2 (nl < 6.9) 0 (nl < 6.7)   Johnson-DeMeester Score 0.9 (Normal < 14.7)  (Pandolfino JE, Am J Gastroenterol. 2003; 98: 740-749)     Diagnosis: Abnormal pH study off acid suppression, with normalization of acid exposure when PPI resumed.  Discussion/Recommendations: The findings support a diagnosis of GERD with adequate acid suppression by the current regimen.    Procedure code: GERD test with mucosal pH electrode (60454)    Crista Elliot, MD

## 2020-10-22 NOTE — OR Nursing
Patient returned Bravo recorder with diary today. Patient called and confirmed some data. Data uploaded and  Dr. Cleda Clarks was paged.

## 2020-10-26 ENCOUNTER — Ambulatory Visit: Payer: PRIVATE HEALTH INSURANCE

## 2020-10-30 ENCOUNTER — Ambulatory Visit: Payer: PRIVATE HEALTH INSURANCE

## 2020-10-30 ENCOUNTER — Encounter: Payer: Self-pay | Admitting: Family Medicine

## 2020-10-30 NOTE — Telephone Encounter (Signed)
I have her scheduled with Percell Miller 11/04/20 at 1:00 pm

## 2020-11-03 DIAGNOSIS — K219 Gastro-esophageal reflux disease without esophagitis: Secondary | ICD-10-CM

## 2020-11-03 NOTE — Progress Notes
PATIENT: Alice Saunders  MRN: [id number]  DOB: May 08, 1956  DATE OF SERVICE: 11/05/20    GENERAL SURGERY OUTPATIENT PROGRESS NOTE    TELEMEDICINE ENCOUNTER    Patient Consent to Telehealth   The patient agreed to participate in the video visit prior to joining the visit.      REASON FOR VISIT: questions about upcoming surgery    HPI:     Alice Saunders is a 65 y.o. female  with a long-standing history of GERD (at least 25 years) and a hiatal hernia, planned for a hybrid laparoscopic hiatal hernia repair and transoral incisionless fundoplication on 11/15/20. Presents with additional questions regarding the surgery.     Has not be able to get the manometry (gaging). PH study was consistent with GERD off PPI.     She has additional questions/concerns about the upcoming surgery such as efficacy, durability, and potential long-term complications.    Current Outpatient Medications   Medication Sig   ??? cholecalciferol 10 mcg (400 units) tablet Take 5,000 mcg by mouth daily.   ??? escitalopram 10 mg tablet Take 10 mg by mouth daily.   ??? esomeprazole 40 mg capsule Take 40 mg by mouth every morning before breakfast.   ??? levothyroxine (SYNTHROID) 100 mcg tablet Take 100 mcg by mouth daily.     No current facility-administered medications for this visit.        Allergies   Allergen Reactions   ??? Cefdinir Diarrhea   ??? Codeine Hives   ??? Droperidol      Unable to speak by pt. ''spaced out''         REVIEW OF SYSTEMS:  A 14-point review of system was done during the current visit and is negative except for the pertinent positives described in the HPI and past medical history.      Objective       10/17/20 EGD:  DIAGNOSIS:  1. LA Grade B esophagitis from GEJ at 31 cm to 35 cm  2. Gastric cardia polyps, largest 2-3 were biopsied   3. Bravo pH probe placed to evaluate for acid reflux with distal tip at 29 cm, 6 cm above the GEJ  4. Hill Grade IV valve with hiatal hernia 3 cm in axial length.   5. O/w normal EGD      10/17/20 96-hr Bravo pH study:  Diagnosis: Abnormal pH study off acid suppression, with normalization of acid exposure when PPI resumed.  Discussion/Recommendations: The findings support a diagnosis of GERD with adequate acid suppression by the current regimen.  ???    06/05/20 EGD    ???    ???    ???  ???            Assessment and Plan:     Alice Saunders is a 65 y.o. female with history of GERD with e/o esophagitis and hiatal hernia.    Discussed the pros and cons of surgical approach to repair hiatal hernia and correct acid reflux. ???After a long discussion, the patient is interested in pursuing the surgical approach.     - plan for a laparoscopic hiatal hernia repair and partial fundoplication.     The patient was examined and discussed with the attending physician, Dr. Kennith Center, who agrees with assessment and plan listed above.  Author:    Beryle Beams, NP  General Surgery  [phone]

## 2020-11-04 ENCOUNTER — Encounter: Payer: Self-pay | Admitting: Medical

## 2020-11-04 ENCOUNTER — Other Ambulatory Visit: Payer: Self-pay

## 2020-11-04 ENCOUNTER — Ambulatory Visit: Payer: Managed Care, Other (non HMO) | Admitting: Medical

## 2020-11-04 VITALS — BP 104/64 | HR 75 | Resp 20 | Ht 66.0 in | Wt 179.8 lb

## 2020-11-04 DIAGNOSIS — Z01818 Encounter for other preprocedural examination: Secondary | ICD-10-CM

## 2020-11-04 DIAGNOSIS — F419 Anxiety disorder, unspecified: Secondary | ICD-10-CM

## 2020-11-04 DIAGNOSIS — E782 Mixed hyperlipidemia: Secondary | ICD-10-CM

## 2020-11-04 DIAGNOSIS — F32A Depression, unspecified: Secondary | ICD-10-CM

## 2020-11-04 DIAGNOSIS — K449 Diaphragmatic hernia without obstruction or gangrene: Secondary | ICD-10-CM

## 2020-11-04 DIAGNOSIS — K219 Gastro-esophageal reflux disease without esophagitis: Secondary | ICD-10-CM

## 2020-11-04 DIAGNOSIS — E039 Hypothyroidism, unspecified: Secondary | ICD-10-CM | POA: Diagnosis not present

## 2020-11-04 MED ORDER — CYCLOBENZAPRINE HCL 5 MG PO TABS: 5.0000 mg | ORAL_TABLET | Freq: Every day | ORAL | 0 refills | Status: AC

## 2020-11-04 NOTE — Progress Notes (Addendum)
Subjective:    Patient ID: [name], female    DOB: 22-Jul-1956, 65 y.o.   MRN: [id number]  HPI Pt in for a pre-op physical exam.   Pt has surgery on 11/16/2019. Pt doing procedure at [facility].  Family in that area to help post of.  Procedures are Laposcopic ventral hernia repair and transoroal incisonless fundoplication.   Pt reports hx of hiatal hernia.   Patient has a longstanding history of acid reflux and heartburn with multiple prior endoscopies since 1995. She previously had an EGD in November 2008 that demonstrated nondysplastic Barrett's esophagus (reportedly "ultrashort" segment, report not available), but evaluations since then have been negative for Barrett's esophagus.   She last underwent EGD on 06/05/20 (Dr. Gerrit Heck, [facility]) which showed GEJ at 35 cm, diaphagrmatic pinch at 40 cm, 5 cm hiatal hernia, Hill grade IV valve. Mild intrinsic stenosis was found 34 cm from incisors that dilated with 18-19-20 mm balloon dilator with mild mucosal disruption. There was circumferential salmon colored mucosa from 34-35 cm that was biopsied (chronic inflammation, no Barrett's esophagus). Lastly, there were multiple gastric sessile polyps 2-6 mm in size that were removed (fundic gland polyps).   She has been taking esomeprazole 20 mg twice daily about 30 minutes prior to breakfast and dinner. Prior to this, she has trial other PPIs including Prilosec and Prevacid without response. She reports esomeprazole has led to overall control of her heartburn and reflux, but she states any changes in the timing or missing a dose will lead to significant heartburn. She otherwise denies F/C, N/V, abdominal pain, cough.  Past Medical & Surgical History: Thyroid cancer Depression Esophagitis GERD Hiatal Hernia Osteoporosis Prior thyroidectomy   Relevant Family History:  []  Family history of colorectal cancer  []  Family history of GI cancer (not CRC)  []  Family history of IBD   []  Family history of celiac disease No family history of sudden death.  No relevant family history of GI malignancy.   Relevant Social History:  Lives in [place], [place]. Denies any significant history of smoking or alcohol use.  Past surgery.(no history of issues/reaction to general anesthesia).  Rt knee- partial knee replacement. Rt shoulder-surgery for frozen shoulder Appendectomy Oophorectomy rt side. Rt side carpel tunnel surgery. Bladder surgery/sling. Thyoridectomy.    Meds. Crestor 5 mg every other day. lexapro 10 mg daily. nexium 20 mg bid. sythroid 100 mcg daily. Carafate 1 gram daily prn if needed. Vit D 50 mcg daily.  Stop bang score- 1. Only positive is age.  Marthann Schiller 04/18/2020 nsr.   Normal functional capacity. easily walks mile.   cxr not indicated.   Review of Systems  Constitutional: Negative for chills, fatigue and fever.  HENT: Negative for congestion, drooling, ear discharge, ear pain and facial swelling.   Respiratory: Negative for cough, chest tightness, shortness of breath, wheezing and stridor.   Cardiovascular: Negative for chest pain and palpitations.       No hx of chf  Gastrointestinal: Negative for abdominal distention, abdominal pain, constipation, diarrhea and nausea.  Genitourinary: Negative for difficulty urinating, dysuria, pelvic pain and urgency.  Musculoskeletal: Negative for back pain and myalgias.       Rt tmj soreness  Neurological: Negative for dizziness, seizures, syncope and light-headedness.  Hematological: Negative for adenopathy. Does not bruise/bleed easily.  Psychiatric/Behavioral: Negative for behavioral problems, confusion and dysphoric mood. The patient is not nervous/anxious and is not hyperactive.        Mood stable.    Past  Medical History:  Diagnosis Date  . Acute upper respiratory infection 09/13/2016  . Allergic rhinitis   . Allergy   . Anxiety state 08/24/2016  . Barrett's esophagus without dysplasia     pt says was suspected never confirmed 9 or 10 years ago  . Cancer [facility])    thyroid cancer  . Depression   . Esophagitis   . Fundic gland polyps of stomach, benign   . GERD (gastroesophageal reflux disease)   . Hemorrhoids   . History of hiatal hernia   . History of ovarian cyst   . History of thyroid cancer    05-01-2011  s/p  total thyroidectomy for multinodular goiter and papillary carcinoma  . Hyperglycemia 03/01/2016  . Hyperlipidemia    borderline  . Hypothyroidism, postsurgical   . Internal hemorrhoids   . Lumbar spondylosis    L4 -- S1  . Osteoporosis    osteopenia  . Overweight 08/14/2009   Qualifier: Diagnosis of  By: Niel Hummer MD, Lorinda Creed   . Palpitations 03/01/2016  . PONV (postoperative nausea and vomiting)    nausea with pain meds  . TMJ (temporomandibular joint disorder)      Social History   Socioeconomic History  . Marital status: Married    Spouse name: Not on file  . Number of children: 2  . Years of education: Not on file  . Highest education level: Not on file  Occupational History  . Occupation: realtor  Tobacco Use  . Smoking status: Former Smoker    Years: 5.00    Types: Cigarettes    Start date: 09/28/1984    Quit date: 08/28/1985    Years since quitting: 35.2  . Smokeless tobacco: Never Used  Vaping Use  . Vaping Use: Never used  Substance and Sexual Activity  . Alcohol use: Yes    Comment: rarely  . Drug use: No  . Sexual activity: Not on file  Other Topics Concern  . Not on file  Social History Narrative  . Not on file   Social Determinants of Health   Financial Resource Strain: Not on file  Food Insecurity: Not on file  Transportation Needs: Not on file  Physical Activity: Not on file  Stress: Not on file  Social Connections: Not on file  Intimate Partner Violence: Not on file    Past Surgical History:  Procedure Laterality Date  . APPENDECTOMY  01-30-2008  . CARPAL TUNNEL RELEASE Right 08/30/2014   Procedure: RIGHT  CARPAL TUNNEL RELEASE;  Surgeon: Linna Hoff, MD;  Location: [facility];  Service: Orthopedics;  Laterality: Right;  . COLONOSCOPY  08-08-2007  . HEMORRHOID SURGERY    . LAPAROSCOPIC RIGHT OOPHORECTOMY  1996  . LAPAROSCOPIC TUBAL LIGATION  07-21-2002   W/   Dilatation and evacuation  . LEFT SHOULDER CLOSED MANIPULATION/  ARTHROSCOPIC DEBRIDEMENT LABRUM/  SAD/ RELEASE  CA LIGAMENT/ BURSECTOMY  10-12-2011  . PARTIAL KNEE ARTHROPLASTY Right 12/09/2015   Procedure: RIGHT KNEE MEDIAL UNICOMPARTMENTAL ARTHROPLASTY ;  Surgeon: Gaynelle Arabian, MD;  Location: [facility];  Service: Orthopedics;  Laterality: Right;  . TOTAL THYROIDECTOMY  05-01-2011  . TRANSOBTURATOR SLING  01-07-2005   W/ CYSTOCELE REPAIR  . UPPER GASTROINTESTINAL ENDOSCOPY  last one 06-29-2014    Family History  Problem Relation Age of Onset  . Diabetes Father   . Cancer Father        prostate  . Stroke Father        bacterial endocarditis after prostate surgery  .  Heart disease Father        aortic valvular disease, MI  . Thyroid disease Mother        hyperparathyroidism  . Alzheimer's disease Mother   . Hyperparathyroidism Mother   . Atrial fibrillation Mother   . Hypertension Mother   . Hyperlipidemia Mother   . Thyroid disease Maternal Grandmother   . Hyperlipidemia Brother   . Hypertension Brother   . Allergies Brother   . Heart disease Daughter        palpitations  . Esophageal cancer Neg Hx   . Colon cancer Neg Hx   . Colon polyps Neg Hx   . Rectal cancer Neg Hx   . Stomach cancer Neg Hx     Allergies  Allergen Reactions  . Celestone [Betamethasone Sodium Phosphate] Other (See Comments)    "Feels hot and looks sunburned"  . Droperidol Other (See Comments)    "feels like I have withdrawal symptoms"  . Omni-Pac [Cefdinir] Diarrhea and Other (See Comments)    Other Reaction: Other reaction  . Statins Other (See Comments)    "joint stiffness"  . Hydrocodone Itching and Rash    Current  Outpatient Medications on File Prior to Visit  Medication Sig Dispense Refill  . AMBULATORY NON FORMULARY MEDICATION Medication Name: Nitroglycerin compound 0.125% apply pea size amount into rectum twice daily x 4 to 6 weeks 30 g 1  . Calcium Citrate-Vitamin D (CITRACAL + D PO) Take 2 tablets by mouth daily. 1200mg     . escitalopram (LEXAPRO) 10 MG tablet Take 1 tablet by mouth daily.  2  . esomeprazole (NEXIUM) 20 MG capsule TAKE 1 CAPSULE TWICE DAILY BEFORE MEALS 60 capsule 0  . esomeprazole (NEXIUM) 40 MG capsule TAKE 1 CAPSULE (40 MG) BY MOUTH 2 TIMES DAILY BEFORE MEAL 60 capsule 0  . MegaRed Omega-3 Krill Oil 350 MG CAPS Take 1 capsule by mouth daily.    . Probiotic Product (PROBIOTIC PO) Take 1 capsule by mouth as needed. Lucky Probiotic    . rosuvastatin (CRESTOR) 5 MG tablet Take 1 tablet (5 mg total) by mouth every other day. 45 tablet 1  . sucralfate (CARAFATE) 1 g tablet Take 1 tablet (1 g total) by mouth 2 (two) times daily as needed. 60 tablet 0  . SYNTHROID 100 MCG tablet TAKE 1 TABLET ONCE DAILY BEFORE BREAKFAST. 34 tablet 6  . Vitamin D, Cholecalciferol, 50 MCG (2000 UT) CAPS Take 1 capsule by mouth daily.     No current facility-administered medications on file prior to visit.    BP 104/64   Pulse (!) 55   Resp 20   Ht 5\' 6"  (1.676 m)   Wt 179 lb 12.8 oz (81.6 kg)   SpO2 91%   BMI 29.02 kg/m       Objective:   Physical Exam  General Mental Status- Alert. General Appearance- Not in acute distress.   Skin General: Color- Normal Color. Moisture- Normal Moisture.  Neck Carotid Arteries- Normal color. Moisture- Normal Moisture. No carotid bruits. No JVD.  Chest and Lung Exam Auscultation: Breath Sounds:-Normal.  Cardiovascular Auscultation:Rythm- Regular. Murmurs & Other Heart Sounds:Auscultation of the heart reveals- No Murmurs.  Abdomen Inspection:-Inspeection Normal. Palpation/Percussion:Note:No mass. Palpation and Percussion of the abdomen reveal- Non  Tender, Non Distended + BS, no rebound or guarding.   Neurologic Cranial Nerve exam:- CN III-XII intact(No nystagmus), symmetric smile. Strength:- 5/5 equal and symmetric strength both upper and lower extremities.  Rt tmj- on opening and closing no crepitus.  Assessment & Plan:  Normal pre-op physical exam. Normal ekg 04-18-2020, pre-op lab pending. Chronic medical conditions controlled. No cardiac or pulmonary history on review.  Lab pending. See copies.  Pt appears stable for surgery/low risk and cleared for procedures.  Continue current meds for chronic conditions.  For rt mild tmj pain with dislocation during dental procedure will give 2 tabs flexeril 5 mg to use at night.  Follow up as regular scheduled with pcp or as needed  Time spent with patient today was 40  minutes which consisted of chart review for pt new to me, discussing diagnoses, history, preop labs, reviewed prior ekg, treatment and documentation.

## 2020-11-04 NOTE — Patient Instructions (Addendum)
Normal pre-op physical exam. Normal ekg 04-18-2021, pre-op lab pending. Chronic medical conditions controlled. No cardiac or pulmonary history on review.  Lab pending. See copies.  Pt appears stable for surgery/low risk and cleared for procedures.  Continue current meds for chronic stable  conditions.  For rt mild tmj pain with dislocation during dental procedure will give 2 tabs flexeril 5 mg to use at night.  Follow upas regular scheduled with pcp or as needed

## 2020-11-05 ENCOUNTER — Telehealth: Payer: PRIVATE HEALTH INSURANCE

## 2020-11-05 ENCOUNTER — Telehealth: Payer: Self-pay | Admitting: Medical

## 2020-11-05 LAB — COMPREHENSIVE METABOLIC PANEL
ALT: 13 U/L (ref 0–35)
AST: 14 U/L (ref 0–37)
Albumin: 4.4 g/dL (ref 3.5–5.2)
Alkaline Phosphatase: 91 U/L (ref 39–117)
BUN: 12 mg/dL (ref 6–23)
CO2: 32 mEq/L (ref 19–32)
Calcium: 9.3 mg/dL (ref 8.4–10.5)
Chloride: 103 mEq/L (ref 96–112)
Creatinine, Ser: 0.72 mg/dL (ref 0.40–1.20)
GFR: 88.99 mL/min (ref >60.00)
Glucose, Bld: 86 mg/dL (ref 70–99)
Potassium: 4.9 mEq/L (ref 3.5–5.1)
Sodium: 140 mEq/L (ref 135–145)
Total Bilirubin: 0.5 mg/dL (ref 0.2–1.2)
Total Protein: 6.6 g/dL (ref 6.0–8.3)

## 2020-11-05 LAB — CBC WITH DIFFERENTIAL/PLATELET
Basophils Absolute: 0 10*3/uL (ref 0.0–0.1)
Basophils Relative: 0.7 % (ref 0.0–3.0)
Eosinophils Absolute: 0.1 10*3/uL (ref 0.0–0.7)
Eosinophils Relative: 1.6 % (ref 0.0–5.0)
HCT: 41.9 % (ref 36.0–46.0)
Hemoglobin: 13.7 g/dL (ref 12.0–15.0)
Lymphocytes Relative: 28.6 % (ref 12.0–46.0)
Lymphs Abs: 1.4 10*3/uL (ref 0.7–4.0)
MCHC: 32.7 g/dL (ref 30.0–36.0)
MCV: 90.4 fl (ref 78.0–100.0)
Monocytes Absolute: 0.4 10*3/uL (ref 0.1–1.0)
Monocytes Relative: 7.4 % (ref 3.0–12.0)
Neutro Abs: 3.1 10*3/uL (ref 1.4–7.7)
Neutrophils Relative %: 61.7 % (ref 43.0–77.0)
Platelets: 261 10*3/uL (ref 150.0–400.0)
RBC: 4.63 Mil/uL (ref 3.87–5.11)
RDW: 13.7 % (ref 11.5–15.5)
WBC: 5 10*3/uL (ref 4.0–10.5)

## 2020-11-05 LAB — TSH: TSH: 0.54 u[IU]/mL (ref 0.35–4.50)

## 2020-11-05 LAB — T4, FREE: Free T4: 1.12 ng/dL (ref 0.60–1.60)

## 2020-11-05 MED ORDER — SYNTHROID 100 MCG PO TABS: ORAL_TABLET | ORAL | 6 refills | Status: DC

## 2020-11-05 NOTE — Telephone Encounter (Signed)
Refill synthroid sent to pt pharmacy.

## 2020-11-07 ENCOUNTER — Telehealth: Payer: Self-pay | Admitting: Medical

## 2020-11-07 NOTE — Telephone Encounter (Signed)
Form faxed

## 2020-11-07 NOTE — Telephone Encounter (Signed)
I am done with pt pre-op evaluation. Will you fax over my note the day, her stop bang score, cbc, cmp and copy of 04/18/2020 ekg. If you would do today. I think she is heading out of town soon to do surgery next week.

## 2020-11-07 NOTE — Telephone Encounter (Signed)
Opened to review 

## 2020-11-13 ENCOUNTER — Institutional Professional Consult (permissible substitution): Payer: PRIVATE HEALTH INSURANCE

## 2020-11-13 DIAGNOSIS — Z01818 Encounter for other preprocedural examination: Secondary | ICD-10-CM

## 2020-11-14 LAB — COVID-19 PCR: COVID-19 PCR: NOT DETECTED

## 2020-11-15 MED ADMIN — PROPOFOL 200 MG/20ML IV EMUL (ANES): INTRAVENOUS | @ 21:00:00 | Stop: 2020-11-15

## 2020-11-15 MED ADMIN — ACETAMINOPHEN 10 MG/ML IV SOLN: INTRAVENOUS | @ 21:00:00 | Stop: 2020-11-15 | NDC 67457094010

## 2020-11-15 MED ADMIN — HYDROMORPHONE HCL 2 MG/ML IJ SOLN: INTRAVENOUS | @ 21:00:00 | Stop: 2020-11-15 | NDC 00409336510

## 2020-11-15 MED ADMIN — HYDROMORPHONE HCL 2 MG/ML IJ SOLN: INTRAVENOUS | @ 20:00:00 | Stop: 2020-11-15 | NDC 00409336510

## 2020-11-15 MED ADMIN — MIDAZOLAM HCL 10 MG/10ML IJ SOLN: INTRAVENOUS | @ 20:00:00 | Stop: 2020-11-15 | NDC 00409258705

## 2020-11-15 MED ADMIN — CEFAZOLIN SODIUM 1 G IJ SOLR: INTRAVENOUS | @ 20:00:00 | Stop: 2020-11-15 | NDC 00143992490

## 2020-11-15 MED ADMIN — LIDOCAINE HCL (PF) 1 % IJ SOLN: INTRAVENOUS | @ 20:00:00 | Stop: 2020-11-15 | NDC 63323049237

## 2020-11-15 MED ADMIN — PHENYLEPHRINE HCL 10 MG/ML IV SOLN: INTRAVENOUS | @ 20:00:00 | Stop: 2020-11-15 | NDC 00641614225

## 2020-11-15 MED ADMIN — ROCURONIUM BROMIDE 50 MG/5ML IV SOLN: INTRAVENOUS | @ 20:00:00 | Stop: 2020-11-15 | NDC 43066000710

## 2020-11-15 MED ADMIN — SUGAMMADEX SODIUM 200 MG/2ML IV SOLN: INTRAVENOUS | @ 21:00:00 | Stop: 2020-11-15 | NDC 00006542312

## 2020-11-15 MED ADMIN — DEXTROSE 5 %/LACTATED RINGERS: 75 mL/h | INTRAVENOUS | @ 23:00:00 | Stop: 2020-11-17 | NDC 00338012504

## 2020-11-15 MED ADMIN — DEXAMETHASONE SODIUM PHOSPHATE 4 MG/ML IJ SOLN: INTRAVENOUS | @ 20:00:00 | Stop: 2020-11-15 | NDC 67457042312

## 2020-11-15 MED ADMIN — BUPIVACAINE HCL (PF) 0.5 % IJ SOLN: @ 22:00:00 | Stop: 2020-11-16 | NDC 00409116202

## 2020-11-15 MED ADMIN — PROPOFOL 200 MG/20ML IV EMUL: INTRAVENOUS | @ 20:00:00 | Stop: 2020-11-15 | NDC 63323026929

## 2020-11-15 MED ADMIN — ONDANSETRON HCL 4 MG/2ML IJ SOLN: INTRAVENOUS | @ 21:00:00 | Stop: 2020-11-15 | NDC 60505613005

## 2020-11-15 MED ADMIN — FENTANYL CITRATE (PF) 100 MCG/2ML IJ SOLN: INTRAVENOUS | @ 20:00:00 | Stop: 2020-11-15 | NDC 00409909422

## 2020-11-15 MED ADMIN — PLASMA-LYTE A IV SOLN: INTRAVENOUS | @ 20:00:00 | Stop: 2020-11-15 | NDC 00338022104

## 2020-11-15 NOTE — Brief Op Note
Brief Operative/Procedure Note    Patient: Alice Saunders    Date of Operation(s)/Procedure(s): 11/15/2020    Pre-op Diagnosis: Hiatal hernia [K44.9]  Gastroesophageal reflux disease, unspecified whether esophagitis present [K21.9]       Post-op Diagnosis: same    Operation(s)/Procedure(s):  LAPAROSCOPIC HIATAL HERNIA REPAIR, PARTIAL FUNDOPLICATION    Surgeon(s) and Role:     * Hines, Reinaldo Berber., MD - Primary     * Alvan Dame, MD - Surgeon 1st Assist - Resident    Anesthesia Staff and Role:  Anesthesia Resident: Theadora Rama., MD  Anesthesiologist: Heloise Ochoa., MD    Anesthesia Type:   General    Pre-Op Medications: Ancef    Intra-op Medications: (Antibiotics, Anticoagulants, Immunosuppressants)  ceFAZolin  dexamethasone    Blood Products: none    Fluids:    Estimated Blood Loss: Minimal    Findings: Moderate hiatal hernia, otherwise normal foregut anatomy    Complications: None; patient tolerated the operation(s)/procedure(s) well.                 Specimens:   * No specimens in log *    Drains: none            Staff and Role:   Circulating Nurse: Michael Boston, RN; Donne Anon, RN; Didiana, Marchelle Folks, RN; Teresa Coombs, RN  Scrub Person: Michael Boston, RN; Wilson Singer, RN  Circul PG Nurse: Buel Ream, RN    Alvan Dame, MD    Date: 11/15/2020  Time: 1:36 PM

## 2020-11-15 NOTE — Consults
CASE MANAGER ASSESSMENT      Admit WGYK:[id number]    Date of Initial CM Assessment: 11/15/2020    Problems: Active Problems:    Hiatal hernia POA: Yes       Past Medical History:   Diagnosis Date    Barrett's esophagus     Depression     Esophagitis     Gastric polyps     GERD (gastroesophageal reflux disease)     Hiatal hernia     HLD (hyperlipidemia)     Hypothyroidism     Osteoporosis     Papillary thyroid carcinoma (HCC/RAF)     TMJ (dislocation of temporomandibular joint)     Past Surgical History:   Procedure Laterality Date    THYROIDECTOMY            Primary Care Physician:Blyth, Bryon Lions., MD  [phone]      NEEDS ASSESSMENT:     Level of Function Prior to Admit: Self Care/Indep. W ADLs    Primary Living Situation: Lives Alone (Will dc to daughter's house once medically cleared.)    Pre-admission Living Situation: Home/Apartment     Primary Support Systems: Children    Contact Name: Theone Murdoch (dtr) Phone Number: [phone]     DPOA?: Yes DPOA Type: Medical       Prior Treatments / Services: None    Who is your PCP?: Dr. Danise Edge    Do you have your Primary Care Doctor's office number?: Yes    How often do you visit your doctor?: Quarterly (As needed)    Do you need information/education regarding your medical condition?: No       Verbalized financial concerns?: No       Were you hospitalized in the last 30 days?: No      DISCHARGE ASSESSMENT:     Projected Date of Discharge:     Anticipated Complex D/C?: No    Projected Discharge to: Home    Discharge Address: [address]. [place], [place] [address]    Projected Discharge Needs: None      Support Identified at Discharge: Child  Name of Discharge Support Person: Theone Murdoch (dtr) Phone Number: [phone]     Who is available to transport you upon discharge?: Family Transportation           Delrae Sawyers,  11/15/2020

## 2020-11-15 NOTE — H&P
UPDATED H&P REQUIREMENT    For [facility] and [facility]    WHAT IS THE STATUS OF THE PATIENT'S MOST CURRENT HISTORY AND PHYSICAL?   - The most current H&P is >24 hours and <30 days, and having examined the patient, I attest that there have been no changes. (This suffices as an update to the H&P).      REFER TO MEDICAL STAFF POLICIES REGARDING PRE-PROCEDURE HISTORY AND PHYSICAL EXAMINATION AND UPDATED H&P REQUIREMENTS BELOW:    [place] [facility] and [facility] Medical Staff Policy 200 - For Patients Undergoing Procedures Requiring Moderate or Deep Sedation, General Anesthesia or Regional Anesthesia    Contents of a History and Physical Examination (H&P):    The H&P shall consist of chief complaint, history of present illness, allergies and medications, relevant social and family history, past medical history, review of systems and physical examination, and assessment and plan appropriate to the patient's age.    For Patients Undergoing Procedures Requiring Moderate or Deep Sedation, General Anesthesia or Regional Anesthesia:    1. An H&P shall be performed within 24 hours prior to the procedure by a qualified member of the medical staff or designee with appropriate privileges, except as noted in item 2 below.    2. If a complete history and physical was performed within thirty (30) calendar days prior to the patient's admission to the Medical Center for elective surgery, a member of the medical staff assumes the responsibility for the accuracy of the clinical information and will need to document in the medical record within twenty-four (24) hours of admission and prior to surgery or major invasive procedure, that they either attest that the history and physical has been reviewed and accepted, or document an update of the original history and physical relevant to the patient's current  clinical status.    3. Providing an H&P for patients undergoing surgery under local anesthesia is at the discretion of the Attending Physician.     4. When a procedure is performed by a dentist, podiatrist or other practitioner who is not privileged to perform an H&P, the anesthesiologist's assessment immediately prior to the procedure will constitute the 24 hour re-assessment.The dentist, podiatrist or other practitioner who is not privileged to perform an H&P will document the history and physical relevant to the procedure.    5. If the H&P and the written informed consent for the surgery or procedure are not recorded in the patient's medical record prior to surgery, the operation shall not be performed unless the attending physician states in writing that such a delay could lead to an adverse event or irreversible damage to the patient.    6. The above requirements shall not preclude the rendering of emergency medical or surgical care to a patient in dire circumstances.

## 2020-11-15 NOTE — OR Nursing
Called [facility] and spoke with Alan Ripper. She called wanting an update because she received a call and missed it. She was concerned it was something about her mother. She stated she is receiving text messages. I let her know since the last text message she received about the surgery starting, there have been no major updates. The procedure is proceeding as planned and the patient is doing well. Alan Ripper was relieved to hear this update.

## 2020-11-16 LAB — Phosphorus: PHOSPHORUS: 3.8 mg/dL (ref 2.3–4.4)

## 2020-11-16 LAB — Basic Metabolic Panel
CHLORIDE: 107 mmol/L — ABNORMAL HIGH (ref 96–106)
TOTAL CO2: 25 mmol/L (ref 20–30)

## 2020-11-16 LAB — CBC: RED BLOOD CELL COUNT: 4.28 x10E6/uL (ref 3.96–5.09)

## 2020-11-16 LAB — Magnesium: MAGNESIUM: 1.7 meq/L (ref 1.4–1.9)

## 2020-11-16 MED ADMIN — ACETAMINOPHEN 325 MG PO TABS: 650 mg | ORAL | @ 17:00:00 | Stop: 2020-11-19 | NDC 50580045811

## 2020-11-16 MED ADMIN — ACETAMINOPHEN 32 MG/ML PO LIQUID: 650 mg | ORAL | @ 02:00:00 | Stop: 2020-12-16 | NDC 66689005601

## 2020-11-16 MED ADMIN — LIDOCAINE 5 % EX PTCH: 1 | TRANSDERMAL | @ 01:00:00 | Stop: 2020-12-15 | NDC 00591352511

## 2020-11-16 MED ADMIN — ONDANSETRON HCL 4 MG/2ML IJ SOLN: 4 mg | INTRAVENOUS | @ 20:00:00 | Stop: 2020-12-16 | NDC 60505613000

## 2020-11-16 MED ADMIN — ONDANSETRON HCL 4 MG/2ML IJ SOLN: 4 mg | INTRAVENOUS | @ 14:00:00 | Stop: 2020-12-16 | NDC 60505613000

## 2020-11-16 MED ADMIN — PANTOPRAZOLE SODIUM 40 MG IV SOLR: 40 mg | INTRAVENOUS | @ 23:00:00 | Stop: 2020-11-17 | NDC 55150020200

## 2020-11-16 MED ADMIN — ACETAMINOPHEN 325 MG PO TABS: 650 mg | ORAL | @ 23:00:00 | Stop: 2020-11-19 | NDC 50580045811

## 2020-11-16 MED ADMIN — ESCITALOPRAM OXALATE 10 MG PO TABS: 10 mg | ORAL | @ 05:00:00 | Stop: 2020-12-16 | NDC 68084061711

## 2020-11-16 MED ADMIN — LIDOCAINE 5 % EX PTCH: 1 | TRANSDERMAL | @ 14:00:00 | Stop: 2020-11-19

## 2020-11-16 MED ADMIN — ONDANSETRON HCL 4 MG/2ML IJ SOLN: 4 mg | INTRAVENOUS | @ 08:00:00 | Stop: 2020-11-19 | NDC 60505613000

## 2020-11-16 MED ADMIN — ACETAMINOPHEN 32 MG/ML PO LIQUID: 650 mg | ORAL | @ 08:00:00 | Stop: 2020-11-16 | NDC 66689005601

## 2020-11-16 MED ADMIN — LIDOCAINE 5 % EX PTCH: 1 | TRANSDERMAL | @ 23:00:00 | Stop: 2020-11-19

## 2020-11-16 MED ADMIN — ACETAMINOPHEN 32 MG/ML PO LIQUID: 650 mg | ORAL | @ 14:00:00 | Stop: 2020-11-16

## 2020-11-16 MED ADMIN — LIDOCAINE 5 % EX PTCH: 1 | TRANSDERMAL | @ 23:00:00 | Stop: 2020-11-19 | NDC 00591352511

## 2020-11-16 MED ADMIN — ENOXAPARIN SODIUM 40 MG/0.4ML SC SOLN: 40 mg | SUBCUTANEOUS | @ 05:00:00 | Stop: 2020-11-19 | NDC 63323056421

## 2020-11-16 MED ADMIN — LEVOTHYROXINE SODIUM 100 MCG PO TABS: 100 ug | ORAL | @ 14:00:00 | Stop: 2020-11-19 | NDC 51079044201

## 2020-11-16 MED ADMIN — PANTOPRAZOLE SODIUM 40 MG IV SOLR: 40 mg | INTRAVENOUS | @ 01:00:00 | Stop: 2020-11-17 | NDC 00781323295

## 2020-11-16 NOTE — Other
Patients Clinical Goal:   Clinical Goal(s) for the Shift: VSS, Pain controlled, Ambulation, Tolerate clear liquid diet, Maintain pt. safety and comfort   Identify possible barriers to advancing the care plan: None  Stability of the patient: Moderately Unstable - medium risk of patient condition declining or worsening    End of Shift Summary:       Patient A&OX4, vital signs stable. Pain controlled with Tylenol and lidocaine patch. Patient denies nausea/vomiting. Patient tolerating clear liquid diet. Patient ambulated to bathroom and around unit with standby assist no complications noted. Patients son at bedside, questions and concerns addressed. Patient transferred to 8E @1630  accompanied by RN and patient transport. Report given to Val RN.      0730- Rounds completed with surgical team. Updates and questions/concerns addressed with patient.     46 Dr. Joanie Coddington paged regarding patient's request for Tylenol in pill form.    1253- Dr. Kennith Center at bedside to assess patient. Questions and concerns addressed.

## 2020-11-16 NOTE — Other
Patients Clinical Goal:   Clinical Goal(s) for the Shift: Hemodynamic stability, Comfort and Safety  Identify possible barriers to advancing the care plan: NA  Stability of the patient: Moderately Stable - low risk of patient condition declining or worsening   End of Shift Summary: No change in patients initial assessment, vital signs stable, safety and comfort maintained.  Patient without any complaints.

## 2020-11-16 NOTE — Progress Notes
PATIENT:Alice Saunders  GNF:[id number]  VHQ:IONG BOARDING/20  DATE OF SERVICE:11/16/2020  LENGTH OF STAY: 1     C SURGERY PROGRESS NOTE  1 Day Post-Op  LOS: 1 day     ID: Keniah Klemmer is a 65 y.o. female with a long-standing history of GERD (at least 25 years) and a hiatal hernia s/p hiatal hernia repair and toupee fundiplicatoin on 2/19.       Interval History     2/19 POD 1: No acute overnight events. No CP/SOB, N/V. No flatus/Bm. Endorses mild shoulder pain.                                                          Objective     Current Medications:  Current Facility-Administered Medications   Medication Dose Route Frequency   ??? acetaminophen tab 650 mg  650 mg Oral Q6H   ??? dextrose 5%/lactated ringers IV soln  75 mL/hr Intravenous Continuous   ??? enoxaparin 40 mg/0.4 mL inj 40 mg  40 mg Subcutaneous Q24H   ??? escitalopram tab 10 mg  10 mg Oral Daily   ??? HYDROmorphone 1 mg/mL inj 0.4 mg  0.4 mg IV Push Q4H PRN   ??? ibuprofen tab 400 mg  400 mg Oral Q6H PRN   ??? levothyroxine tab 100 mcg  100 mcg Oral Daily   ??? lidocaine 5% patch 1 patch  1 patch Transdermal Q24H   ??? ondansetron 4 mg/2 mL inj 4 mg  4 mg Intravenous Q6H   ??? pantoprazole inj 40 mg  40 mg IV Push Q24H   ??? prochlorperazine 10 mg/2 mL inj 5 mg  5 mg IV Push Q4H PRN         Vital Signs:Temp:  [36.2 ???C (97.2 ???F)-37.1 ???C (98.7 ???F)] 37 ???C (98.6 ???F)  Heart Rate:  [70-87] 85  Resp:  [12-23] 19  BP: (96-141)/(60-116) 124/73  NBP Mean:  [71-121] 87  SpO2:  [93 %-99 %] 95 %    PEEP (setting):  [5 cmH2O-8 cmH2O] 8 cmH2O     Detailed I&O:   I/O last 3 completed shifts:  In: 1700 [I.V.:1700]  Out: 100 [Urine:100]    Intake/Output Summary (Last 24 hours) at 11/16/2020 0925  Last data filed at 11/16/2020 0600  Gross per 24 hour   Intake 1700 ml   Output 100 ml   Net 1600 ml        Physical Exam:  General: No acute distress.  Neuro: Awake, alert and oriented. Moves all extremities.  Resp: Unlabored breathing. Symmetrical expansion.  Abdomen: Soft, non distended, appropriately TTP, no guarding, no rebound tenderness.  Incision: incisions dressed with OR dressing, c/d/i  Extremities: WWP, no edema  Drains: none         Lab Review:    WBC/Hgb/Hct/Plts:  7.96/12.7/39.1/241 (02/19 0346)   Na/K/Cl/CO2/BUN/Cr/glu:  143/4.3/107/25/9/0.59/125 (02/19 0346)             Imaging: None     Assessment and Plan:   Alice Saunders is a 65 y.o. female with a long-standing history of GERD (at least 25 years) and a hiatal hernia s/p hiatal hernia repair and toupee fundiplicatoin on 2/19.   1 Day Post-Op    LOS: 1 day        This patient was seen  in collaboration with  Attending, Dr. Marquita Palms single:[id number]::''Timothy Donahue'',''Jonathan King'',''Fredrick Eilber'',''Mark Girgis'',''Joseph Crompton'',''Oscar Hines''}  who participated in the pertinent aspects of the physical exam, assessment, diagnosis, and treatment plan. Final treatment plan per attending as follows:  Author:  Allyne Gee 11/16/2020 9:25 AM      Plan    Neuro  # Acute post-surgical pain  - Acetaminophen ATC, lidocaine patch, dilaudid PRN    CV: No active issues   - Continue to monitor     Pulm: No active issues  - Encourage IS, OOB and ambulate TID    FEN/GI: S/p hiatal hernia repair and fundiplication  - Advance to CLD  - PPI qday, Zofran q6  - Monitor for return of bowel function  - Monitor drain output, volume and quality  - Continue to replete lytes    GU: No Active issues   - Continue to monitor UOP, Cr     Heme: No active issues  - Lovenox PPx    ID: No active issues   - Continue to monitor     Endo:   # Hypothyroidism  - Continue home levo    PPx:  - SCDs, IS, Lovenox, ambulate TID    Dispo: Home when medically stable   - PT eval for postop mobilization, dispo recs   - Home needs: None    The patient was examined and discussed with chief resident and surgical attending Dr. Donette Larry who agree with assessment and plans listed above.  Author:    Allyne Gee, MS3  Surgical Oncology, C Service   Pgr [phone]

## 2020-11-16 NOTE — Nursing Note
Received pt from PACU via bed awake and oriented x 4 . Pain adequately control. On IVF as ordered infusing well. Pt ambulate to bathroom with minimal assist. Pt Burp multiple times. Remain in bed in upright position speaking with son at the bedside, call light remain within reach.

## 2020-11-17 LAB — Phosphorus: PHOSPHORUS: 3.6 mg/dL (ref 2.3–4.4)

## 2020-11-17 LAB — CBC: MCH CONCENTRATION: 33.1 g/dL (ref 31.5–35.5)

## 2020-11-17 LAB — Basic Metabolic Panel
CALCIUM: 8.5 mg/dL — ABNORMAL LOW (ref 8.6–10.4)
TOTAL CO2: 26 mmol/L (ref 20–30)

## 2020-11-17 LAB — Magnesium: MAGNESIUM: 1.7 meq/L (ref 1.4–1.9)

## 2020-11-17 MED ORDER — OXYCODONE HCL 5 MG PO TABS
2.5 mg | ORAL_TABLET | Freq: Four times a day (QID) | ORAL | 0 refills | 5.00 days | Status: AC | PRN
Start: 2020-11-17 — End: ?
  Filled 2020-11-19: qty 10, 5d supply, fill #0

## 2020-11-17 MED ORDER — ACETAMINOPHEN 325 MG PO TABS
650 mg | ORAL_TABLET | Freq: Four times a day (QID) | ORAL | 0 refills | 5.00000 days | Status: AC | PRN
Start: 2020-11-17 — End: ?
  Filled 2020-11-20: qty 35, 5d supply, fill #0

## 2020-11-17 MED ORDER — PROCHLORPERAZINE MALEATE 10 MG PO TABS
10 mg | ORAL_TABLET | Freq: Four times a day (QID) | ORAL | 0 refills | 7.00 days | Status: AC | PRN
Start: 2020-11-17 — End: ?
  Filled 2020-11-19: qty 25, 7d supply, fill #0

## 2020-11-17 MED ORDER — PANTOPRAZOLE SODIUM 40 MG PO TBEC
40 mg | ORAL_TABLET | Freq: Every day | ORAL | 2 refills | 30.00 days | Status: AC
Start: 2020-11-17 — End: ?
  Filled 2020-11-19: qty 30, 30d supply, fill #0

## 2020-11-17 MED ORDER — ONDANSETRON 4 MG PO TBDP
4 mg | ORAL_TABLET | Freq: Four times a day (QID) | ORAL | 0 refills | 8.00 days | Status: AC | PRN
Start: 2020-11-17 — End: ?
  Filled 2020-11-19: qty 30, 8d supply, fill #0

## 2020-11-17 MED ADMIN — PROCHLORPERAZINE EDISYLATE 10 MG/2ML IJ SOLN: 5 mg | INTRAVENOUS | @ 17:00:00 | Stop: 2020-11-19 | NDC 23155029442

## 2020-11-17 MED ADMIN — LEVOTHYROXINE SODIUM 100 MCG PO TABS: 100 ug | ORAL | @ 14:00:00 | Stop: 2020-12-16 | NDC 51079044201

## 2020-11-17 MED ADMIN — ONDANSETRON HCL 4 MG/2ML IJ SOLN: 4 mg | INTRAVENOUS | @ 02:00:00 | Stop: 2020-11-19 | NDC 60505613000

## 2020-11-17 MED ADMIN — ACETAMINOPHEN 325 MG PO TABS: 650 mg | ORAL | @ 04:00:00 | Stop: 2020-11-23 | NDC 50580045811

## 2020-11-17 MED ADMIN — LIDOCAINE 5 % EX PTCH: 1 | TRANSDERMAL | @ 09:00:00 | Stop: 2020-11-19

## 2020-11-17 MED ADMIN — ONDANSETRON HCL 4 MG/2ML IJ SOLN: 4 mg | INTRAVENOUS | @ 14:00:00 | Stop: 2020-11-19 | NDC 60505613000

## 2020-11-17 MED ADMIN — ONDANSETRON HCL 4 MG/2ML IJ SOLN: 4 mg | INTRAVENOUS | @ 08:00:00 | Stop: 2020-11-19 | NDC 60505613000

## 2020-11-17 MED ADMIN — ACETAMINOPHEN 325 MG PO TABS: 650 mg | ORAL | @ 08:00:00 | Stop: 2020-11-23

## 2020-11-17 MED ADMIN — ENOXAPARIN SODIUM 40 MG/0.4ML SC SOLN: 40 mg | SUBCUTANEOUS | @ 04:00:00 | Stop: 2020-11-19 | NDC 63323056421

## 2020-11-17 MED ADMIN — MAGNESIUM SULFATE 2 GM/50ML IV SOLN: 2 g | INTRAVENOUS | @ 19:00:00 | Stop: 2020-11-17 | NDC 25021061281

## 2020-11-17 MED ADMIN — DEXTROSE 5 %/LACTATED RINGERS: 75 mL/h | INTRAVENOUS | @ 01:00:00 | Stop: 2020-11-17 | NDC 00338012504

## 2020-11-17 MED ADMIN — ACETAMINOPHEN 325 MG PO TABS: 650 mg | ORAL | @ 14:00:00 | Stop: 2020-11-19 | NDC 50580045811

## 2020-11-17 MED ADMIN — ONDANSETRON HCL 4 MG/2ML IJ SOLN: 4 mg | INTRAVENOUS | @ 21:00:00 | Stop: 2020-11-19 | NDC 60505613000

## 2020-11-17 MED ADMIN — LIDOCAINE 5 % EX PTCH: 1 | TRANSDERMAL | @ 22:00:00 | Stop: 2020-11-19 | NDC 00591352511

## 2020-11-17 MED ADMIN — SUMATRIPTAN SUCCINATE 100 MG PO TABS: 100 mg | ORAL | @ 17:00:00 | Stop: 2020-11-18 | NDC 65862014836

## 2020-11-17 MED ADMIN — ACETAMINOPHEN 325 MG PO TABS: 650 mg | ORAL | @ 21:00:00 | Stop: 2020-11-23 | NDC 50580045811

## 2020-11-17 MED ADMIN — ESCITALOPRAM OXALATE 10 MG PO TABS: 10 mg | ORAL | @ 05:00:00 | Stop: 2020-11-19 | NDC 68084061711

## 2020-11-17 MED ADMIN — LIDOCAINE 5 % EX PTCH: 1 | TRANSDERMAL | @ 19:00:00 | Stop: 2020-11-19 | NDC 00591352511

## 2020-11-17 MED ADMIN — PANTOPRAZOLE SODIUM 40 MG IV SOLR: 40 mg | INTRAVENOUS | @ 22:00:00 | Stop: 2020-11-17 | NDC 00781323295

## 2020-11-17 NOTE — Discharge Summary
SURGICAL ONCOLOGY DISCHARGE SUMMARY  PATIENT:  Alice Saunders MRN:  [id number]  DOB:  1956/05/01    Attending Physician: Fidela Juneau., MD  Primary Care Physician:  Bradd Canary., MD    Admission Date:  11/15/2020  Discharge Date:  11/17/2020    Admission Diagnosis:    Hiatal hernia [K44.9]  Gastroesophageal reflux disease, unspecified whether esophagitis present [K21.9]  Discharge Diagnosis:   Hiatal hernia [K44.9]  Gastroesophageal reflux disease, unspecified whether esophagitis present [K21.9]    Surgery Performed:   Procedure(s) (LRB):  LAPAROSCOPIC HIATAL HERNIA REPAIR, PARTIAL FUNDOPLICATION (N/A)    Complications:   None    Consultations:   None    Chronic Conditions:  Past Medical History:   Diagnosis Date   ??? Barrett's esophagus    ??? Depression    ??? Esophagitis    ??? Gastric polyps    ??? GERD (gastroesophageal reflux disease)    ??? Hiatal hernia    ??? HLD (hyperlipidemia)    ??? Hypothyroidism    ??? Osteoporosis    ??? Papillary thyroid carcinoma (HCC/RAF)    ??? TMJ (dislocation of temporomandibular joint)        HPI: Alice Saunders is a 65 y.o. female with a long-standing history of GERD (at least 25 years).  ???  Patient reports a long-standing acid reflux history. Has recently undergone anti-reflux surgery evaluation in [place], but decided to have this done at [facility] (her son and daughter live in [place] area). Her recent endoscopy reveals a 5 cm hiatal hernia and chronic esophagitis in the distal esophagus without e/o metaplasia/dysplasia/malignancy.    Brief Hospital Course:     The patient was taken to the OR on 11/15/2020   for the aforementioned procedure (see above).  The procedure was successful without any complications. The patient tolerated the procedure well (see Op Note for further details). Postoperatively, the patient was awakened, extubated, and transported to the PACU in stable condition and then the 8th floor. The rest of her hospital stay was uneventful except for nausea and a headache that resolved with compazine and sumatriptan. She was discharged in stable condition on postoperative day 3.     On day of discharge, the patient was afebrile, voiding spontaneously, hemodynamically stable, tolerating PO, passing flatus, ambulating without assistance, pain was well-controlled on oral medication, and incisions were clean, dry, and intact.    Physical Exam:  BP 131/85  ~ Pulse 77  ~ Temp 36.6 ???C (97.9 ???F) (Oral)  ~ Resp 16  ~ Ht 1.689 m (5' 6.5'')  ~ Wt 81 kg (178 lb 9.2 oz)  ~ SpO2 93%  ~ BMI 28.39 kg/m???   General: No acute distress.   Neuro: Alert & oriented x 4.   HEENT: Oropharynx moist without exudates.  Cardiovascular: Regular rate and rhythm.     Pulm: Normal respiratory excursions, not tachypneic, no labored breathing.  GI: Abdomen soft, appropriately-tender, non-distended, incisions with steri strips c/d/i  GU:  Voiding without difficulty.   Extremities: Moving all four extremities, no peripheral edema noted.   Surgical Drains:  None    Discharge Labs:  WBC/Hgb/Hct/Plts:  6.57/12.3/37.2/202 (02/20 0535)   Na/K/Cl/CO2/BUN/Cr/glu:  141/3.8/106/26/8/0.69/97 (02/20 0535)  No results found for: ALT, AST, GGT, ALKPHOS, BILITOT    Imaging/Studies:  No imaging has been resulted in the last 30 days    Discharge Medications:   Current Discharge Medication List      START taking these medications    Details   acetaminophen  325 mg tablet Take 2 tablets (650 mg total) by mouth every six (6) hours as needed for Pain.  Qty: 35 tablet, Refills: 0      ondansetron ODT 4 mg disintegrating tablet Take 1 tablet (4 mg total) by mouth every six (6) hours as needed for Nausea or Vomiting.  Qty: 30 tablet, Refills: 0      oxyCODONE 5 mg tablet Take one-half tablet (2.5 mg total) by mouth every six (6) hours as needed for Moderate Pain (Pain Scale 4-6) or Severe Pain (Pain Scale 7-10). Max Daily Amount: 10 mg  Qty: 10 tablet, Refills: 0      pantoprazole 40 mg DR tablet Take 1 tablet (40 mg total) by mouth daily.  Qty: 30 tablet, Refills: 2      prochlorperazine 10 mg tablet Take 1 tablet (10 mg total) by mouth every six (6) hours as needed for Nausea or Vomiting.  Qty: 25 tablet, Refills: 0         CONTINUE these medications which have NOT CHANGED    Details   cholecalciferol 10 mcg (400 units) tablet Take 5,000 mcg by mouth daily.      escitalopram 10 mg tablet Take 10 mg by mouth daily.      esomeprazole 40 mg capsule Take 40 mg by mouth every morning before breakfast.      levothyroxine (SYNTHROID) 100 mcg tablet Take 100 mcg by mouth daily.      rosuvastatin 5 mg tablet Take 5 mg by mouth at bedtime.              Disposition: Home.      Discharge Condition: Stable    Diet: Blenderized diet for 2 weeks    Activity/ Restrictions: Ambulate as tolerated.  No heavy lifting over 5-10 pounds for 4-6 weeks, and may increase activity as tolerated.  May shower once dressings are removed but do not submerge your incisions in a bath or pool.    Return Precautions: As listed on After Visit Summary (AVS), but also including:      - Onset of severe, persistent pain not relieved by medication and rest.      - Difficulty obtaining medications      - Any new onset of or increased weakness, numbness or tingling      - Persistent chills; new onset of fever > 101 degrees F, or night sweats      - Any redness, swelling, drainage, heat, or pain around your incision      - Any new onset of chest pain or shortness of breath      - If applicable, any clogging or dislodgement of your surgical drain.    Post-Discharge Referrals: None    Post-Discharge Appointments:   The patient was asked to follow up with Fidela Juneau., MD for a postoperative visit within 1-2 weeks.      Additional appointments should be made with:   Follow up with your primary care doctor in 2-3 weeks.    The patient was seen and examined by the Surgical Oncology team.  Above plan was discussed with chief resident and attending physician, Kennith Center Reinaldo Berber., MD, who were in agreement with the plan.    Author: Saunders Glance. Joanie Coddington, MD  11/17/2020 3:00 PM  Division of General Surgery

## 2020-11-17 NOTE — Progress Notes
PATIENT:Alice Saunders  ZOX:[id number]  LOC:8129/A  DATE OF SERVICE:11/17/2020  LENGTH OF STAY: 2     C SURGERY PROGRESS NOTE  2 Days Post-Op  LOS: 2 days     ID: Alice Saunders is a 65 y.o. female with a long-standing history of GERD (at least 25 years) and a hiatal hernia s/p hiatal hernia repair and toupee fundiplicatoin on 2/19.       Interval History     2/19 POD 1: No acute overnight events. No CP/SOB, N/V. No flatus/Bm. Endorses mild shoulder pain.  2/20 POD 2: Headache and nausea, no emesis. Appropriate UOP. Mild shoulder pain. No BM.                                                         Objective     Current Medications:  Current Facility-Administered Medications   Medication Dose Route Frequency    acetaminophen tab 650 mg  650 mg Oral Q6H    enoxaparin 40 mg/0.4 mL inj 40 mg  40 mg Subcutaneous Q24H    escitalopram tab 10 mg  10 mg Oral Daily    HYDROmorphone 1 mg/mL inj 0.2 mg  0.2 mg IV Push Q6H PRN    levothyroxine tab 100 mcg  100 mcg Oral Daily    lidocaine 5% patch 1 patch  1 patch Transdermal Q24H    ondansetron 4 mg/2 mL inj 4 mg  4 mg Intravenous Q6H    oxyCODONE tab 2.5 mg  2.5 mg Oral Q6H PRN    Or    oxyCODONE tab 5 mg  5 mg Oral Q6H PRN    pantoprazole inj 40 mg  40 mg IV Push Q24H    prochlorperazine 10 mg/2 mL inj 5 mg  5 mg IV Push Q4H PRN    SUMAtriptan tab 100 mg  100 mg Oral Q2H PRN         Vital Signs:Temp:  [36.2 ???C (97.2 ???F)-36.8 ???C (98.2 ???F)] 36.6 ???C (97.9 ???F)  Heart Rate:  [62-79] 66  Resp:  [9-21] 16  BP: (125-148)/(84-97) 146/97  NBP Mean:  [98-111] 111  SpO2:  [93 %-98 %] 93 %          Detailed I&O:   I/O last 3 completed shifts:  In: 2185 [P.O.:460; I.V.:1725]  Out: 1150 [Urine:1150]    Intake/Output Summary (Last 24 hours) at 11/17/2020 0821  Last data filed at 11/17/2020 0544  Gross per 24 hour   Intake 1210 ml   Output 1050 ml   Net 160 ml        Physical Exam:  General: No acute distress.  Neuro: Awake, alert and oriented. Moves all extremities.  Resp: Unlabored breathing. Symmetrical expansion.  Abdomen: Soft, non distended, appropriately TTP, no guarding, no rebound tenderness.  Incision: incisions dressed with OR dressing, c/d/i  Extremities: WWP, no edema  Drains: none         Lab Review:    WBC/Hgb/Hct/Plts:  6.57/12.3/37.2/202 (02/20 0535)   Na/K/Cl/CO2/BUN/Cr/glu:  141/3.8/106/26/8/0.69/97 (02/20 0535)             Imaging: None     Assessment and Plan:   Alice Saunders is a 65 y.o. female with a long-standing history of GERD (at least 25 years) and a hiatal hernia s/p hiatal hernia repair and  toupee fundoplicatoin on 2/19.   2 Days Post-Op    LOS: 2 days      Plan    Neuro  # Acute post-surgical pain  - Acetaminophen ATC, lidocaine patch, dilaudid PRN    CV: No active issues   - Continue to monitor     Pulm: No active issues  - Encourage IS, OOB and ambulate TID    FEN/GI: S/p hiatal hernia repair and fundiplication  - CLD, will advance in afternoon if doing well  - PPI qDay  - Zofran q6H  - Compazine prn  - Monitor for return of bowel function  - Monitor drain output, volume and quality  - Continue to replete lytes    GU: No Active issues   - Continue to monitor UOP, Cr     Heme: No active issues  - Lovenox PPx    ID: No active issues   - Continue to monitor     Endo:   # Hypothyroidism  - Continue home levothyroxine    PPx:  - SCDs, IS, Lovenox, ambulate TID    Dispo: Home when medically stable   - PT eval for postop mobilization, dispo recs   - Home needs: None    The patient was examined and discussed with chief resident and surgical attending Dr. Donette Larry who agree with assessment and plans listed above.  Author:    Saunders Glance. Joanie Coddington, MD  Surgical Oncology, C Service   Pgr [phone]    I have seen this patient and agree with the assessment and plan.

## 2020-11-17 NOTE — Progress Notes
Pharmaceutical Services - Admission Medication Reconciliation Note      Patient Name: Alice Saunders [facility]  Medical Record Number: [id number]  Admit date: 11/15/2020 1:41 PM    Age: 65 y.o.  Sex: female  Allergies: Codeine, Betamethasone, Cefdinir, Droperidol, and Tramadol  Height:   Most recent documented height   11/15/20 1.689 m (5' 6.5'')     Actual Weight:   Most recent documented weight   11/15/20 81 kg   10/17/20 80.4 kg     Diagnosis: The patient is currently admitted with the following concerns/issues: Active Problems:    Hiatal hernia POA: Yes      Reported Medication History   I spoke with the patient to update the home medication list for this hospital admission.    PTA Medication List (discrepancies are noted)   Prior to Admission medications as of 11/17/20 0925   Medication Sig Notes Last Dose   cholecalciferol 10 mcg (400 units) tablet 5,000 mcg, Oral, Daily   Past Week at Unknown time   escitalopram 10 mg tablet 10 mg, Oral, Daily   11/14/2020 at 2300   esomeprazole 40 mg capsule 40 mg, Oral, Every morning before breakfast   11/15/2020 at 0300   levothyroxine (SYNTHROID) 100 mcg tablet 100 mcg, Oral, Daily   11/15/2020 at 0620   rosuvastatin 5 mg tablet 5 mg, Oral, Every night at bedtime            Discharge Prescription Preference:   [facility] [place], [place] - [address]  [address] [place]  [place] [address]        The patient's medications have been reviewed and the PTA med list is updated.     The reconciliation of admission orders with PTA med list is complete. Can reassess restarting Rosuvastatin when medically appropriate.     Kimanh Templeman L. Elmyra Banwart, PharmD, 11/17/2020, 9:27 AM

## 2020-11-17 NOTE — Op Note
DATE OF OPERATION:  11/15/2020      PREOPERATIVE DIAGNOSIS:  Gastroesophageal reflux.    POSTOPERATIVE DIAGNOSIS:  Gastroesophageal reflux.    NAME OF OPERATION:  Laparoscopic repair of hiatal hernia and toupee fundoplication.      SURGEON:  Reinaldo Berber. [place], MD [phone])      ASSISTANT:  Daisy Lazar, MD    ANESTHESIA:  General endotracheal.    INDICATIONS:  The patient is a 65 year old female with a longstanding history of gastroesophageal reflux. She reports long-standing reflux for 25 years and has undergone evaluation which showed evidence of a 5 cm hiatal hernia, esophagitis without any evidence of Barrett's. She was not able to tolerate a manometry test or a pH study, and so we decided to modify the fundoplication to a partial wrap. She initially considered a TIFF procedure also, but elected to have an operative approach to her reflux. All the risks, benefits, and alternatives described to her. She was given a dose of cefoxitin for the incision and sequential compression devices were placed.    DESCRIPTION OF PROCEDURE:  After informed consent, patient was taken the operating room under general anesthesia. Was prepped and draped in a sterile fashion. Using a #5 optical trocar, the abdomen was accessed to the left and superior to the umbilicus. Insufflation was obtained. Two #5s were placed in the right mid abdomen and a #5 in the left upper quadrant. In addition, a #5 was placed in the subxiphoid space. The patient was placed in reverse Trendelenburg position. Using a lap track retractor, we retracted the left lobe of the liver cephalad and were able to identify the hiatus. The patient had a moderate-sized hiatal hernia noted. We started the dissection with the Harmonic Scalpel by incising the pars flaccida up to the right crus of the diaphragm and then incised the peritoneum along the right crus and circumferentially incised the hernia sac, reducing this out of the chest into the abdomen. From below the esophagus, we identified the left crus of the diaphragm. We then took down short gastrics with the use of the Harmonic, fully mobilized the stomach off the right crus and creating a window behind the esophagus. Then, closed the diaphragm with interrupted 0 silk suture. Five stitches were placed about a centimeter apart and then created the fundoplication. The fundus was brought posterior to the stomach and using the 0 silk suture, we tacked the fundus on the right side of the stomach to the esophagus with 3 interrupted sutures; and then on the left side, began this also, but noted that potentially on first stitch that some of the muscle tore in tying the stitch and so we placed 1 additional stitch and then over-sewed this, bringing the muscle back over the mucosa with interrupted silk. Ultimately, the fundoplication was in good position. We tacked the right side of the fundus to the right crus with an interrupted 0 silk in order to keep this in position. The area was inspected for hemostasis. This was noted. We removed the retractor from the liver and desufflated.  Each of the incisions were sealed with 0.5% Marcaine, and then we closed the skin with 4-0 Monocryl. Steri-Strips and dressings were applied. The patient was extubated, taken to the recovery room in stable condition.    ESTIMATED BLOOD LOSS:  Minimal.    SPECIMENS:  None.      Reinaldo Berber. [place], MD [phone])        [facility] CONF#: [id number]  D: 11/16/2020 12:17:58 T: 11/16/2020 16:54:17  DOCUMENT: [id number]

## 2020-11-17 NOTE — Other
Patients Clinical Goal:   Clinical Goal(s) for the Shift: VSS, Pain controlled, Ambulation, Tolerate clear liquid diet, Maintain pt. safety and comfort   Identify possible barriers to advancing the care plan: none  Stability of the patient: Moderately Stable - low risk of patient condition declining or worsening   End of Shift Summary:     Code: Full  Pain/NV: No c/o pain during shift, resting comfortably with no discomfort.   Neuro: AAOx4  Cardiac: monitored, NSR,VSS, no chest pain.   Resp:  Room air sats >95%   GI: No BM today, passing gas, bowel sounds present, soft, non-distended  Diet: clear liquid, tolerated well, no N/V  GU: Voids in the bathroom   Skin:  Incision to abdomen lap sites x5 covered with tegaderm,CDI  Activity: BMAT 3, steady gait. Ambulated to bathroom during shift   LDA: PIV patent, flushes well, D5LR @75ml /h.   Pt transferred from PACU and arrived at room 8129 @16 :35pm. Report received from RN Alyss. Pt is stable and pleasant. Denies any discomfort at this time. Call light within reach. Son at bedside. Will continue plan of care.

## 2020-11-17 NOTE — Other
Patients Clinical Goal: sleep, comfort  Clinical Goal(s) for the Shift: vss, pain control, tolerate liquid diet, comfort  Identify possible barriers to advancing the care plan: n/a  Stability of the patient: Moderately Stable - low risk of patient condition declining or worsening   End of Shift Summary: A&Ox4, vss, on RA. Clear liquid diet, tolerating well. IV fluids D/C'ed. Cardiac monitoring D/C'ed. Pt expresses improvement in acid reflux. Passing gas and belching. Pt c/o nausea and headache in AM. Zofran given x1. Plan is for possible D/C 02/20.

## 2020-11-18 MED ORDER — SUMATRIPTAN SUCCINATE 50 MG PO TABS
50 mg | ORAL_TABLET | ORAL | 0 refills | 30.00 days | Status: AC | PRN
Start: 2020-11-18 — End: ?
  Filled 2020-11-19: qty 8, 30d supply, fill #0

## 2020-11-18 MED ADMIN — METHOCARBAMOL 500 MG PO TABS: 500 mg | ORAL | @ 21:00:00 | Stop: 2020-11-19

## 2020-11-18 MED ADMIN — LIDOCAINE 5 % EX PTCH: 1 | TRANSDERMAL | @ 05:00:00 | Stop: 2020-11-19

## 2020-11-18 MED ADMIN — ACETAMINOPHEN 325 MG PO TABS: 650 mg | ORAL | @ 14:00:00 | Stop: 2020-11-19

## 2020-11-18 MED ADMIN — ACETAMINOPHEN 325 MG PO TABS: 650 mg | ORAL | @ 03:00:00 | Stop: 2020-11-19 | NDC 50580045811

## 2020-11-18 MED ADMIN — ONDANSETRON HCL 4 MG/2ML IJ SOLN: 4 mg | INTRAVENOUS | @ 08:00:00 | Stop: 2020-11-19 | NDC 60505613000

## 2020-11-18 MED ADMIN — ACETAMINOPHEN 325 MG PO TABS: 650 mg | ORAL | @ 08:00:00 | Stop: 2020-11-19 | NDC 50580045811

## 2020-11-18 MED ADMIN — ONDANSETRON HCL 4 MG/2ML IJ SOLN: 4 mg | INTRAVENOUS | @ 03:00:00 | Stop: 2020-12-16 | NDC 60505613000

## 2020-11-18 MED ADMIN — LIDOCAINE 5 % EX PTCH: 1 | TRANSDERMAL | @ 08:00:00 | Stop: 2020-11-19

## 2020-11-18 MED ADMIN — METHOCARBAMOL 500 MG PO TABS: 500 mg | ORAL | @ 14:00:00 | Stop: 2020-11-19

## 2020-11-18 MED ADMIN — METHOCARBAMOL 500 MG PO TABS: 500 mg | ORAL | @ 12:00:00 | Stop: 2020-11-19

## 2020-11-18 MED ADMIN — ENOXAPARIN SODIUM 40 MG/0.4ML SC SOLN: 40 mg | SUBCUTANEOUS | @ 05:00:00 | Stop: 2020-11-19 | NDC 00781324602

## 2020-11-18 MED ADMIN — LEVOTHYROXINE SODIUM 100 MCG PO TABS: 100 ug | ORAL | @ 14:00:00 | Stop: 2020-11-19 | NDC 51079044201

## 2020-11-18 MED ADMIN — LIDOCAINE 5 % EX PTCH: 1 | TRANSDERMAL | @ 19:00:00 | Stop: 2020-11-19 | NDC 00591352511

## 2020-11-18 MED ADMIN — ONDANSETRON HCL 4 MG/2ML IJ SOLN: 4 mg | INTRAVENOUS | @ 21:00:00 | Stop: 2020-11-19

## 2020-11-18 MED ADMIN — ONDANSETRON HCL 4 MG/2ML IJ SOLN: 4 mg | INTRAVENOUS | @ 14:00:00 | Stop: 2020-11-19 | NDC 60505613000

## 2020-11-18 MED ADMIN — ACETAMINOPHEN 325 MG PO TABS: 650 mg | ORAL | @ 21:00:00 | Stop: 2020-11-19

## 2020-11-18 MED ADMIN — SUMATRIPTAN SUCCINATE 100 MG PO TABS: 100 mg | ORAL | @ 12:00:00 | Stop: 2020-11-18 | NDC 65862014836

## 2020-11-18 MED ADMIN — ESCITALOPRAM OXALATE 10 MG PO TABS: 10 mg | ORAL | @ 05:00:00 | Stop: 2020-11-19 | NDC 68084061711

## 2020-11-18 NOTE — Nursing Note
Pt d/c home ,was given all discharge instructions info as to follow up appointment, prescribed meds,activity,diet,danger signals to watch for, pt verbalized all info  given at this time,pt was d/c home with all belonging at 1230 via wheel chair/escort.primary nurse Colette RN notified.

## 2020-11-18 NOTE — Other
Patients Clinical Goal: sleep, pain and nausea control  Clinical Goal(s) for the Shift: vss, pain management, safety  Identify possible barriers to advancing the care plan: n/a  Stability of the patient: Moderately Stable - low risk of patient condition declining or worsening   End of Shift Summary: A&Ox4, vss, on RA. Pt c/o neck pain and migraine. MD notified. Repositioned pt and ice pack given for comfort. Zofran ATC given for nausea. Sumatriptan given for migraine. Pt reports relief of pain but expresses concern for migraine, neck pain, and nausea upon discharge. Plan is for possible discharge 02/21.

## 2020-11-18 NOTE — Other
Patients Clinical Goal:   Clinical Goal(s) for the Shift: vss, d/c, safety  Identify possible barriers to advancing the care plan: n/a  Stability of the patient: Moderately Stable - low risk of patient condition declining or worsening   End of Shift Summary: BP 131/88  ~ Pulse 68  ~ Temp 36.6 C (97.9 F) (Oral)  ~ Resp 18  ~ Ht 1.689 m (5' 6.5'')  ~ Wt 81 kg (178 lb 9.2 oz)  ~ SpO2 95%  ~ BMI 28.39 kg/m    patient d/c'ed home in stable condition. All questions and concerns addressed prior to d/c.

## 2020-11-18 NOTE — Other
Patients Clinical Goal:   Clinical Goal(s) for the Shift: PAIN MGMT, VSS, SAFTEY  Identify possible barriers to advancing the care plan: n/a  Stability of the patient: Moderately Stable - low risk of patient condition declining or worsening   End of Shift Summary: BP 139/99  ~ Pulse 81  ~ Temp 36.6 C (97.9 F) (Oral)  ~ Resp 17  ~ Ht 1.689 m (5' 6.5'')  ~ Wt 81 kg (178 lb 9.2 oz)  ~ SpO2 96%  ~ BMI 28.39 kg/m   Precautions: Standard  Pain/NV: pain mgmt with Tylenol. Zofran given ATC for nausea, reports relief  Neuro: AAOx4  Cardiac: VSS, denies cp  Resp:  Room air sats >95%   GI: bowel sounds present, soft, non-distended. BM. Advanced blendarized diet  GU: Voiding w/no discomfort  Skin: abdominal incision intact    Activity: BMAT 4  LDA: PIV SL    Plan: may d/c home tomorrow. Call light within reach at all times. Bed locked and in low position. Maintain patient safety at all times. will continue plan of care.

## 2020-11-18 NOTE — Progress Notes
Pharmaceutical Services - Meds to [facility] Discharge Medication Reconciliation and Counseling Note    Patient Name: Alice Saunders [facility]  Medical Record Number: [id number]  Admit date: 11/15/2020 1:41 PM    Age: 65 y.o.  Sex: female  Allergies: Codeine, Betamethasone, Cefdinir, Droperidol, and Tramadol    Preferred Pharmacy:   [facility] [place], [place] - [address]  [address] [place]  [place] [address]         I reconciled the discharge medications and counseled the patient on all new prescriptions. Discharge prescriptions were delivered to bedside. The purpose, potential side effects, storage, missed doses and any special instructions related to each medication was discussed.    Discharge Medication List from AVS:     Changes To My Medications      START taking these medications      Dose Instructions   acetaminophen 325 mg tablet  Commonly known as: Tylenol   650 mg   Take 2 tablets (650 mg total) by mouth every six (6) hours as needed for Pain.     ondansetron ODT 4 mg disintegrating tablet  Commonly known as: Zofran ODT   4 mg   Take 1 tablet (4 mg total) by mouth every six (6) hours as needed for Nausea or Vomiting.     oxyCODONE 5 mg tablet   2.5 mg   Take one-half tablet (2.5 mg total) by mouth every six (6) hours as needed for Moderate Pain (Pain Scale 4-6) or Severe Pain (Pain Scale 7-10). Max Daily Amount: 10 mg     pantoprazole 40 mg DR tablet  Commonly known as: Protonix   40 mg   Take 1 tablet (40 mg total) by mouth daily.     prochlorperazine 10 mg tablet  Commonly known as: Compazine   10 mg   Take 1 tablet (10 mg total) by mouth every six (6) hours as needed for Nausea or Vomiting.     SUMAtriptan 50 mg tablet  Commonly known as: Imitrex   50 mg   Take 1 tablet (50 mg total) by mouth every two (2) hours as needed for Migraine (Do not take more than 2 tablets per day).        Prescriptions      These medications were sent to [facility] [phone])  [address], [place] [address]    Hours: Mon-Fri 8AM-9PM, Sat 8AM-7PM, Sun/Holidays 8AM-5PM (Closed 1PM-2PM for lunch) Phone: [phone]   ??? acetaminophen 325 mg tablet  ??? ondansetron ODT 4 mg disintegrating tablet  ??? oxyCODONE 5 mg tablet  ??? pantoprazole 40 mg DR tablet  ??? prochlorperazine 10 mg tablet  ??? SUMAtriptan 50 mg tablet       Patient declined to discuss other home meds stating familiarity and readiness for discharge.      [name] Drown, PharmD, 11/18/2020, 10:01 AM

## 2020-11-25 DIAGNOSIS — K219 Gastro-esophageal reflux disease without esophagitis: Secondary | ICD-10-CM

## 2020-11-25 DIAGNOSIS — Z09 Encounter for follow-up examination after completed treatment for conditions other than malignant neoplasm: Secondary | ICD-10-CM

## 2020-11-25 NOTE — Progress Notes
PATIENT:Alice Saunders  ZOX:[id number]  DOB: 1956-07-04  DATE OF SERVICE: 11/26/20    GENERAL SURGERY POST-OP PROGRESS NOTE    HPI:     Alice Saunders is a 65 y.o. female with a history of GERD (at least 25 years) and a hiatal hernia, now s/p laparoscopic repair of hiatal hernia and toupee fundoplication. on 11/15/20.     Subjective     Patient is feeling well. Denies fever/chills. Tolerating soft diet. Able to burp. Denies N/V/regurgitation/reflux symptoms. Taking PPI. Was constipated initially post-op, now resolved. Healing incisions.                                                      Objective     Home medications:     Current Outpatient Medications   Medication Sig   ??? acetaminophen 325 mg tablet Take 2 tablets (650 mg total) by mouth every six (6) hours as needed for Pain.   ??? cholecalciferol 10 mcg (400 units) tablet Take 5,000 mcg by mouth daily.   ??? escitalopram 10 mg tablet Take 10 mg by mouth daily.   ??? esomeprazole 40 mg capsule Take 40 mg by mouth every morning before breakfast.   ??? levothyroxine (SYNTHROID) 100 mcg tablet Take 100 mcg by mouth daily.   ??? ondansetron ODT 4 mg disintegrating tablet Take 1 tablet (4 mg total) by mouth every six (6) hours as needed for Nausea or Vomiting.   ??? oxyCODONE 5 mg tablet Take one-half tablet (2.5 mg total) by mouth every six (6) hours as needed for Moderate Pain (Pain Scale 4-6) or Severe Pain (Pain Scale 7-10). Max Daily Amount: 10 mg   ??? pantoprazole 40 mg DR tablet Take 1 tablet (40 mg total) by mouth daily.   ??? prochlorperazine 10 mg tablet Take 1 tablet (10 mg total) by mouth every six (6) hours as needed for Nausea or Vomiting.   ??? rosuvastatin 5 mg tablet Take 5 mg by mouth at bedtime.   ??? SUMAtriptan 50 mg tablet Take 1 tablet (50 mg total) by mouth every two (2) hours as needed for Migraine (Do not take more than 2 tablets per day).     No current facility-administered medications for this visit.         Vital Signs: BP 125/80  ~ Pulse 71  ~ Temp 36.6 ???C (97.8 ???F) (Tympanic)  ~ Ht 5' 6.5'' (1.689 m) Comment: stated ~ Wt 176 lb 12.8 oz (80.2 kg)  ~ SpO2 96%  ~ BMI 28.11 kg/m???      Physical Exam:  General: No acute distress.  Neuro: Awake, alert and oriented. Moves all extremities.  Resp: Unlabored breathing. Symmetrical expansion.  Abdomen: Soft, non distended, no guarding, no rebound tenderness, palpable masses   INCISIONS:  Well approximated w/o any erythema or drainage.   Extremities: WWP, no edema     Assessment and Plan:   Alice Saunders is a 65 y.o. female with a history of GERD (at least 25 years) and a hiatal hernia, now s/p laparoscopic repair of hiatal hernia and toupee fundoplication. on 11/15/20. Doing well post-operatively.  - advance diet gradually to include regular food (avoid soft bread and steak meat)  - continue PPI for 30 days post-op  - continue post-op precautions for 4 weeks post-op  - telemedicine follow up in  2 weeks.    The patient was examined and discussed with the attending physician, Dr. Kennith Center, who agrees with assessment and plan listed above.  Author:    Beryle Beams, NP  General Surgery  [phone]

## 2020-11-26 ENCOUNTER — Ambulatory Visit: Payer: PRIVATE HEALTH INSURANCE | Attending: Gastroenterology

## 2020-11-26 ENCOUNTER — Non-Acute Institutional Stay: Payer: PRIVATE HEALTH INSURANCE

## 2020-11-29 ENCOUNTER — Telehealth: Payer: PRIVATE HEALTH INSURANCE

## 2020-11-29 DIAGNOSIS — Z9889 Other specified postprocedural states: Secondary | ICD-10-CM

## 2020-11-29 DIAGNOSIS — Z8719 Personal history of other diseases of the digestive system: Secondary | ICD-10-CM

## 2020-11-29 NOTE — Progress Notes
Brief Surgical Oncology Phone Note    Received a page at 2358 on 11/28/2020 advising to page patient at the above listed number; I called pt within 10 minutes of receiving the page. In brief pt is a 75F PMH GERD who is s/p laparoscopic hiatal hernia repair w/ toupee fundoplication on 11/15/20. She was last seen in the outpatient clinic on 11/26/20 and was noted to be doing well overall without any issues/concerns. She was advised to f/u via telemedicine in 2 weeks.    Pt states that she's had two episodes of severe fatigue and chills over the past 24 hours; once after vacuuming and washing windows at her son's house around 530pm on 11/28/20 and after dinner and walking up a hill last night. She fell asleep for two hours after the first episode. She otherwise denies fever, chills, n/v, overt CP, SOB, or any other issues/concerns.    Given that pt is traveling back home to [place] and would like to be sure everything is okay prior to her flight, I recommended that she get outpatient labs in the morning. However, her symptoms are not consistent with any notable post-operative complication and may be more likely 2/2 post-operative fatigue. I advised pt to come to the ED if she continues to feel unwell or she develops new symptoms. Pt voiced understanding and agreement with the above assessment and plan. All of her questions were answered.    Ovidio Kin, MD  General Surgery

## 2020-12-03 ENCOUNTER — Ambulatory Visit: Payer: PRIVATE HEALTH INSURANCE

## 2020-12-04 ENCOUNTER — Telehealth: Payer: Self-pay | Admitting: Family Medicine

## 2020-12-04 NOTE — Telephone Encounter (Signed)
Thank you I agree she needs to be seen. Too many possibilities

## 2020-12-04 NOTE — Telephone Encounter (Signed)
CallerLajuan Godbee  Call Back @ [phone]  Patient states she just arrived home from [place] this week , Patient had a hernia repair, Patient states she is having blood pressure drops since being home, Patient's bp  reading is 107/71, Patient states this has happened a few times. Patient states she has spoken with her surgeon and they referred her to [place]. Patient sent to triage nurse, patient transferred back pcp office . Patient is requesting an appointment with DR BLYTH ONLY. Patient declined any other provider, patient notified that DR Charlett Blake is out of the office today.

## 2020-12-04 NOTE — Telephone Encounter (Signed)
Spoke with patient and she that she have been having blood pressure drops.  She called her surgeon and they told her that she should contact us cause it may be an endocrine or heart issue.  Advised that she would need to be seen now at ER or Urgent Care.  She stated she will not be going and that she wants to see Dr. Charlett Blake only.  Advised that Dr. Charlett Blake had a cancellation for 2pm tomorrow.  She said that she has to take her daughter to [place] airport by 430 or so.  I advised her that she should not be driving with bp drops.  She stated that she will see if her daughter can find a way and will call if she cannot make it.

## 2020-12-05 ENCOUNTER — Telehealth: Payer: PRIVATE HEALTH INSURANCE

## 2020-12-05 ENCOUNTER — Ambulatory Visit (INDEPENDENT_AMBULATORY_CARE_PROVIDER_SITE_OTHER): Payer: Managed Care, Other (non HMO) | Admitting: Family Medicine

## 2020-12-05 ENCOUNTER — Encounter: Payer: Self-pay | Admitting: Family Medicine

## 2020-12-05 ENCOUNTER — Other Ambulatory Visit: Payer: Self-pay

## 2020-12-05 VITALS — BP 122/78 | HR 74 | Temp 97.5°F | Resp 16 | Wt 173.2 lb

## 2020-12-05 DIAGNOSIS — R109 Unspecified abdominal pain: Secondary | ICD-10-CM

## 2020-12-05 DIAGNOSIS — E559 Vitamin D deficiency, unspecified: Secondary | ICD-10-CM

## 2020-12-05 DIAGNOSIS — R790 Abnormal level of blood mineral: Secondary | ICD-10-CM | POA: Diagnosis not present

## 2020-12-05 DIAGNOSIS — R110 Nausea: Secondary | ICD-10-CM | POA: Diagnosis not present

## 2020-12-05 DIAGNOSIS — K449 Diaphragmatic hernia without obstruction or gangrene: Secondary | ICD-10-CM

## 2020-12-05 DIAGNOSIS — R5383 Other fatigue: Secondary | ICD-10-CM | POA: Diagnosis not present

## 2020-12-05 DIAGNOSIS — E890 Postprocedural hypothyroidism: Secondary | ICD-10-CM

## 2020-12-05 DIAGNOSIS — R7303 Prediabetes: Secondary | ICD-10-CM

## 2020-12-05 DIAGNOSIS — K5900 Constipation, unspecified: Secondary | ICD-10-CM

## 2020-12-05 NOTE — Progress Notes (Signed)
Patient ID: [name], female    DOB: 07/11/1956  Age: 65 y.o. MRN: [id number]    Subjective:  Subjective  HPI [name] presents for office visit today.  She recommended to come here by general surg  NP Tia Alert of General Surg Dr. Elesa Hacker MD in [place], [place]. She recently underwent inguinal hernia surgery on 11/14/2020. After surgery she had headaches, nausea, and vomiting. The headaches went away but then return. She reports that 2 days later she felt weak and fatigue after cleaning a house object. She reports that on 12/03/2020 she had diarrhea. She described that her stool went from brown to gold to loose gold. And then on 12/04/2020 her stool became more firm. Today she is felling better. She was placed on a liquid diet. She denies any back pain, chest pain, SOB, fever, urinary problems, vaginal pain, or chills at this time.   Review of Systems  Constitutional: Positive for fatigue. Negative for chills and fever.  HENT: Negative for congestion, ear pain, rhinorrhea, sinus pressure and sinus pain.   Eyes: Negative for pain.  Respiratory: Negative for cough, chest tightness and shortness of breath.   Cardiovascular: Negative for chest pain, palpitations and leg swelling.  Gastrointestinal: Positive for diarrhea, nausea and vomiting. Negative for abdominal pain and blood in stool.  Genitourinary: Negative for dysuria, flank pain, urgency, vaginal bleeding, vaginal discharge and vaginal pain.  Musculoskeletal: Negative for back pain and neck pain.  Skin: Negative for rash.  Neurological: Positive for headaches.    History Past Medical History:  Diagnosis Date  . Acute upper respiratory infection 09/13/2016  . Allergic rhinitis   . Allergy   . Anxiety state 08/24/2016  . Barrett's esophagus without dysplasia    pt says was suspected never confirmed 9 or 10 years ago  . Cancer [facility])    thyroid cancer  . Depression   . Esophagitis   . Fundic gland  polyps of stomach, benign   . GERD (gastroesophageal reflux disease)   . Hemorrhoids   . History of hiatal hernia   . History of ovarian cyst   . History of thyroid cancer    05-01-2011  s/p  total thyroidectomy for multinodular goiter and papillary carcinoma  . Hyperglycemia 03/01/2016  . Hyperlipidemia    borderline  . Hypothyroidism, postsurgical   . Internal hemorrhoids   . Lumbar spondylosis    L4 -- S1  . Osteoporosis    osteopenia  . Overweight 08/14/2009   Qualifier: Diagnosis of  By: Niel Hummer MD, Lorinda Creed   . Palpitations 03/01/2016  . PONV (postoperative nausea and vomiting)    nausea with pain meds  . TMJ (temporomandibular joint disorder)     She has a past surgical history that includes Total thyroidectomy (05-01-2011); Hemorrhoid surgery; Appendectomy (01-30-2008); Laparoscopic tubal ligation (07-21-2002); Transobturator sling (01-07-2005); Upper gastrointestinal endoscopy (last one 06-29-2014); Colonoscopy (08-08-2007); LAPAROSCOPIC RIGHT OOPHORECTOMY (1996); LEFT SHOULDER CLOSED MANIPULATION/  ARTHROSCOPIC DEBRIDEMENT LABRUM/  SAD/ RELEASE  CA LIGAMENT/ BURSECTOMY (10-12-2011); Carpal tunnel release (Right, 08/30/2014); and Partial knee arthroplasty (Right, 12/09/2015).   Her family history includes Allergies in her brother; Alzheimer's disease in her mother; Atrial fibrillation in her mother; Cancer in her father; Diabetes in her father; Heart disease in her daughter and father; Hyperlipidemia in her brother and mother; Hyperparathyroidism in her mother; Hypertension in her brother and mother; Stroke in her father; Thyroid disease in her maternal grandmother and mother.She reports that she quit smoking about 35  years ago. Her smoking use included cigarettes. She started smoking about 36 years ago. She quit after 5.00 years of use. She has never used smokeless tobacco. She reports current alcohol use. She reports that she does not use drugs.  Current Outpatient Medications  on File Prior to Visit  Medication Sig Dispense Refill  . AMBULATORY NON FORMULARY MEDICATION Medication Name: Nitroglycerin compound 0.125% apply pea size amount into rectum twice daily x 4 to 6 weeks 30 g 1  . Calcium Citrate-Vitamin D (CITRACAL + D PO) Take 2 tablets by mouth daily. 1200mg     . cyclobenzaprine (FLEXERIL) 5 MG tablet Take 1 tablet (5 mg total) by mouth at bedtime. 2 tablet 0  . escitalopram (LEXAPRO) 10 MG tablet Take 1 tablet by mouth daily.  2  . esomeprazole (NEXIUM) 20 MG capsule TAKE 1 CAPSULE TWICE DAILY BEFORE MEALS 60 capsule 0  . esomeprazole (NEXIUM) 40 MG capsule TAKE 1 CAPSULE (40 MG) BY MOUTH 2 TIMES DAILY BEFORE MEAL 60 capsule 0  . MegaRed Omega-3 Krill Oil 350 MG CAPS Take 1 capsule by mouth daily.    . Probiotic Product (PROBIOTIC PO) Take 1 capsule by mouth as needed. Lucky Probiotic    . rosuvastatin (CRESTOR) 5 MG tablet Take 1 tablet (5 mg total) by mouth every other day. 45 tablet 1  . sucralfate (CARAFATE) 1 g tablet Take 1 tablet (1 g total) by mouth 2 (two) times daily as needed. 60 tablet 0  . SYNTHROID 100 MCG tablet TAKE 1 TABLET ONCE DAILY BEFORE BREAKFAST. 30 tablet 6  . Vitamin D, Cholecalciferol, 50 MCG (2000 UT) CAPS Take 1 capsule by mouth daily.     No current facility-administered medications on file prior to visit.     Objective:  Objective  Physical Exam Vitals and nursing note reviewed.  Constitutional:      General: She is not in acute distress.    Appearance: Normal appearance. She is well-developed. She is not ill-appearing.  HENT:     Head: Normocephalic and atraumatic.     Right Ear: External ear normal.     Left Ear: External ear normal.     Nose: Nose normal.  Eyes:     General:        Right eye: No discharge.        Left eye: No discharge.     Pupils: Pupils are equal, round, and reactive to light.  Cardiovascular:     Rate and Rhythm: Normal rate and regular rhythm.     Pulses: Normal pulses.     Heart sounds:  Normal heart sounds. No murmur heard.   Pulmonary:     Effort: Pulmonary effort is normal.     Breath sounds: Normal breath sounds.  Abdominal:     General: Bowel sounds are normal.     Palpations: Abdomen is soft.     Tenderness: There is no abdominal tenderness.  Musculoskeletal:        General: Normal range of motion.     Cervical back: Normal range of motion and neck supple.  Skin:    General: Skin is warm and dry.  Neurological:     Mental Status: She is alert and oriented to person, place, and time.    BP 122/78   Pulse 74   Temp (!) 97.5 F (36.4 C)   Resp 16   Wt 173 lb 3.2 oz (78.6 kg)   SpO2 95%   BMI 27.96 kg/m  Wt Readings from  Last 3 Encounters:  12/05/20 173 lb 3.2 oz (78.6 kg)  11/04/20 179 lb 12.8 oz (81.6 kg)  10/03/20 177 lb (80.3 kg)     Lab Results  Component Value Date   WBC 6.5 12/05/2020   HGB 14.3 12/05/2020   HCT 42.2 12/05/2020   PLT 299.0 12/05/2020   GLUCOSE 102 (H) 12/05/2020   CHOL 193 10/07/2020   TRIG 92.0 10/07/2020   HDL 58.00 10/07/2020   LDLDIRECT 212.6 08/10/2012   LDLCALC 117 (H) 10/07/2020   ALT 9 12/05/2020   AST 13 12/05/2020   NA 139 12/05/2020   K 5.1 12/05/2020   CL 102 12/05/2020   CREATININE 0.70 12/05/2020   BUN 13 12/05/2020   CO2 27 12/05/2020   TSH 0.54 11/04/2020   INR 1.10 12/03/2015   HGBA1C 5.9 10/07/2020    DG UGI W SINGLE CM (SOL OR THIN BA)  Result Date: 06/26/2020 CLINICAL DATA:  Esophageal dysphagia. Known hiatal hernia and reflux. Recent endoscopy. EXAM: UPPER GI SERIES WITH KUB TECHNIQUE: After obtaining a scout radiograph a routine upper GI series was performed using thin barium FLUOROSCOPY TIME:  Fluoroscopy Time:  2 minutes and 36 seconds Radiation Exposure Index (if provided by the fluoroscopic device): 314 mGy Number of Acquired Spot Images: 0 COMPARISON:  None. FINDINGS: Esophageal dysmotility noted with occasional disruption of the primary peristaltic wave and occasional tertiary  contractions. There is also moderate esophageal stasis. Moderate-sized hiatal hernia is noted. Widely patent lower esophageal mucosal ring. Moderate GE reflux. No mass is identified. The stomach, duodenal bulb and C-loop are unremarkable. IMPRESSION: 1. Moderate-sized hiatal hernia with moderate GE reflux. 2. Esophageal dysmotility and moderate esophageal stasis. 3. Unremarkable appearance of the stomach and duodenum. Electronically Signed   By: Marijo Sanes M.D.   On: 06/26/2020 10:32     Assessment & Plan:  Plan   No orders of the defined types were placed in this encounter.   Problem List Items Addressed This Visit    Postsurgical hypothyroidism (Chronic)    On Levothyroxine, continue to monitor      Prediabetes (Chronic)    hgba1c acceptable, minimize simple carbs. Increase exercise as tolerated.       Diaphragmatic hernia    She underwent laparoscopic repair of hiatal hernia and a toupee fundoplication on 12/18/53 at [facility]. She has been struggling with episodes of weakness and tremulousness intermittently since then. No diaphoresis or nausea. She acknowledges she has over done it on some days but she is concerned aoub some fatigue as well. She is asked to take it easy the next month or so, she will increase hydration to 60-80 ounces daily. Eat small, frequent meals with lean proteins. Maintain a MVI and check labs. Referred to cardiology for evaluation to rule out any cardiac cause of her fatigue.       Vitamin D deficiency    Supplement and monitor      Constipation    Encouraged increased hydration and fiber in diet. Daily probiotics. If bowels not moving can use MOM 2 tbls po in 4 oz of warm prune juice by mouth every 2-3 days. If no results then repeat in 4 hours with  Dulcolax suppository pr, may repeat again in 4 more hours as needed. Seek care if symptoms worsen. Consider daily Miralax and/or Dulcolax if symptoms persist.        Other Visit Diagnoses    Nausea    -  Primary    Relevant Orders   Comprehensive  metabolic panel (Completed)   Sedimentation rate (Completed)   Ambulatory referral to Cardiology   Abdominal pain, unspecified abdominal location       Relevant Orders   CBC with Differential/Platelet (Completed)   Urinalysis (Completed)   Urine Culture (Completed)   EKG 12-Lead (Completed)   Ambulatory referral to Cardiology   Abnormal blood level of magnesium       Relevant Orders   Magnesium (Completed)   Fatigue, unspecified type       Relevant Orders   EKG 12-Lead (Completed)   Ambulatory referral to Cardiology      Follow-up: Return in about 6 weeks (around 01/16/2021).   I,Alexis Bryant,acting as a Education administrator for Penni Homans, MD.,have documented all relevant documentation on the behalf of Penni Homans, MD,as directed by  Penni Homans, MD while in the presence of Penni Homans, MD.  I, Penni Homans, MD, have reviewed all documentation for this visit. The documentation on 12/09/20 for the exam, diagnosis, procedures, and orders are all accurate and complete  Mosie Lukes, MD

## 2020-12-05 NOTE — Patient Instructions (Signed)
Viral Gastroenteritis, Adult  Viral gastroenteritis is also known as the stomach flu. This condition may affect your stomach, small intestine, and large intestine. It can cause sudden watery diarrhea, fever, and vomiting. This condition is caused by many different viruses. These viruses can be passed from person to person very easily (are contagious). Diarrhea and vomiting can make you feel weak and cause you to become dehydrated. You may not be able to keep fluids down. Dehydration can make you tired and thirsty, cause you to have a dry mouth, and decrease how often you urinate. It is important to replace the fluids that you lose from diarrhea and vomiting. What are the causes? Gastroenteritis is caused by many viruses, including rotavirus and norovirus. Norovirus is the most common cause in adults. You can get sick after being exposed to the viruses from other people. You can also get sick by:  Eating food, drinking water, or touching a surface contaminated with one of these viruses.  Sharing utensils or other personal items with an infected person. What increases the risk? You are more likely to develop this condition if you:  Have a weak body defense system (immune system).  Live with one or more children who are younger than 2 years old.  Live in a nursing home.  Travel on cruise ships. What are the signs or symptoms? Symptoms of this condition start suddenly 1-3 days after exposure to a virus. Symptoms may last for a few days or for as long as a week. Common symptoms include watery diarrhea and vomiting. Other symptoms include:  Fever.  Headache.  Fatigue.  Pain in the abdomen.  Chills.  Weakness.  Nausea.  Muscle aches.  Loss of appetite. How is this diagnosed? This condition is diagnosed with a medical history and physical exam. You may also have a stool test to check for viruses or other infections. How is this treated? This condition typically goes away on its  own. The focus of treatment is to prevent dehydration and restore lost fluids (rehydration). This condition may be treated with:  An oral rehydration solution (ORS) to replace important salts and minerals (electrolytes) in your body. Take this if told by your health care provider. This is a drink that is sold at pharmacies and retail stores.  Medicines to help with your symptoms.  Probiotic supplements to reduce symptoms of diarrhea.  Fluids given through an IV, if dehydration is severe. Older adults and people with other diseases or a weak immune system are at higher risk for dehydration. Follow these instructions at home: Eating and drinking  Take an ORS as told by your health care provider.  Drink clear fluids in small amounts as you are able. Clear fluids include: ? Water. ? Ice chips. ? Diluted fruit juice. ? Low-calorie sports drinks.  Drink enough fluid to keep your urine pale yellow.  Eat small amounts of healthy foods every 3-4 hours as you are able. This may include whole grains, fruits, vegetables, lean meats, and yogurt.  Avoid fluids that contain a lot of sugar or caffeine, such as energy drinks, sports drinks, and soda.  Avoid spicy or fatty foods.  Avoid alcohol.   General instructions  Wash your hands often, especially after having diarrhea or vomiting. If soap and water are not available, use hand sanitizer.  Make sure that all people in your household wash their hands well and often.  Take over-the-counter and prescription medicines only as told by your health care provider.  Rest at   home while you recover.  Watch your condition for any changes.  Take a warm bath to relieve any burning or pain from frequent diarrhea episodes.  Keep all follow-up visits as told by your health care provider. This is important.   Contact a health care provider if you:  Cannot keep fluids down.  Have symptoms that get worse.  Have new symptoms.  Feel light-headed or  dizzy.  Have muscle cramps. Get help right away if you:  Have chest pain.  Feel extremely weak or you faint.  See blood in your vomit.  Have vomit that looks like coffee grounds.  Have bloody or black stools or stools that look like tar.  Have a severe headache, a stiff neck, or both.  Have a rash.  Have severe pain, cramping, or bloating in your abdomen.  Have trouble breathing or you are breathing very quickly.  Have a fast heartbeat.  Have skin that feels cold and clammy.  Feel confused.  Have pain when you urinate.  Have signs of dehydration, such as: ? Dark urine, very little urine, or no urine. ? Cracked lips. ? Dry mouth. ? Sunken eyes. ? Sleepiness. ? Weakness. Summary  Viral gastroenteritis is also known as the stomach flu. It can cause sudden watery diarrhea, fever, and vomiting.  This condition can be passed from person to person very easily (is contagious).  Take an ORS if told by your health care provider. This is a drink that is sold at pharmacies and retail stores.  Wash your hands often, especially after having diarrhea or vomiting. If soap and water are not available, use hand sanitizer. This information is not intended to replace advice given to you by your health care provider. Make sure you discuss any questions you have with your health care provider. Document Revised: 03/03/2019 Document Reviewed: 07/20/2018 Elsevier Patient Education  2021 Elsevier Inc.  

## 2020-12-05 NOTE — Telephone Encounter
10-17-20 [facility] pathology result faxed to Dr. Barron Alvine & Dr. Abner Greenspan

## 2020-12-06 LAB — COMPREHENSIVE METABOLIC PANEL
ALT: 9 U/L (ref 0–35)
AST: 13 U/L (ref 0–37)
Albumin: 4.8 g/dL (ref 3.5–5.2)
Alkaline Phosphatase: 97 U/L (ref 39–117)
BUN: 13 mg/dL (ref 6–23)
CO2: 27 mEq/L (ref 19–32)
Calcium: 9.8 mg/dL (ref 8.4–10.5)
Chloride: 102 mEq/L (ref 96–112)
Creatinine, Ser: 0.7 mg/dL (ref 0.40–1.20)
GFR: 92 mL/min (ref >60.00)
Glucose, Bld: 102 mg/dL — ABNORMAL HIGH (ref 70–99)
Potassium: 5.1 mEq/L (ref 3.5–5.1)
Sodium: 139 mEq/L (ref 135–145)
Total Bilirubin: 0.4 mg/dL (ref 0.2–1.2)
Total Protein: 7.3 g/dL (ref 6.0–8.3)

## 2020-12-06 LAB — CBC WITH DIFFERENTIAL/PLATELET
Basophils Absolute: 0.1 10*3/uL (ref 0.0–0.1)
Basophils Relative: 1.2 % (ref 0.0–3.0)
Eosinophils Absolute: 0.1 10*3/uL (ref 0.0–0.7)
Eosinophils Relative: 1.6 % (ref 0.0–5.0)
HCT: 42.2 % (ref 36.0–46.0)
Hemoglobin: 14.3 g/dL (ref 12.0–15.0)
Lymphocytes Relative: 28.6 % (ref 12.0–46.0)
Lymphs Abs: 1.9 10*3/uL (ref 0.7–4.0)
MCHC: 33.8 g/dL (ref 30.0–36.0)
MCV: 89.1 fl (ref 78.0–100.0)
Monocytes Absolute: 0.4 10*3/uL (ref 0.1–1.0)
Monocytes Relative: 6.1 % (ref 3.0–12.0)
Neutro Abs: 4 10*3/uL (ref 1.4–7.7)
Neutrophils Relative %: 62.5 % (ref 43.0–77.0)
Platelets: 299 10*3/uL (ref 150.0–400.0)
RBC: 4.74 Mil/uL (ref 3.87–5.11)
RDW: 13.5 % (ref 11.5–15.5)
WBC: 6.5 10*3/uL (ref 4.0–10.5)

## 2020-12-06 LAB — MAGNESIUM: Magnesium: 2.1 mg/dL (ref 1.5–2.5)

## 2020-12-06 LAB — URINE CULTURE
MICRO NUMBER:: 11632187
SPECIMEN QUALITY:: ADEQUATE

## 2020-12-06 LAB — URINALYSIS
Bilirubin Urine: NEGATIVE
Hgb urine dipstick: NEGATIVE
Ketones, ur: NEGATIVE
Leukocytes,Ua: NEGATIVE
Nitrite: NEGATIVE
Specific Gravity, Urine: 1.015 (ref 1.000–1.030)
Total Protein, Urine: NEGATIVE
Urine Glucose: NEGATIVE
Urobilinogen, UA: 0.2 (ref 0.0–1.0)
pH: 6 (ref 5.0–8.0)

## 2020-12-06 LAB — SEDIMENTATION RATE: Sed Rate: 6 mm/hr (ref 0–30)

## 2020-12-09 DIAGNOSIS — K5900 Constipation, unspecified: Secondary | ICD-10-CM | POA: Insufficient documentation

## 2020-12-09 DIAGNOSIS — K219 Gastro-esophageal reflux disease without esophagitis: Secondary | ICD-10-CM

## 2020-12-09 DIAGNOSIS — Z09 Encounter for follow-up examination after completed treatment for conditions other than malignant neoplasm: Secondary | ICD-10-CM

## 2020-12-09 NOTE — Assessment & Plan Note (Signed)
Supplement and monitor 

## 2020-12-09 NOTE — Assessment & Plan Note (Addendum)
She underwent laparoscopic repair of hiatal hernia and a toupee fundoplication on 05/26/92 at [facility]. She has been struggling with episodes of weakness and tremulousness intermittently since then. No diaphoresis or nausea. She acknowledges she has over done it on some days but she is concerned aoub some fatigue as well. She is asked to take it easy the next month or so, she will increase hydration to 60-80 ounces daily. Eat small, frequent meals with lean proteins. Maintain a MVI and check labs. Referred to cardiology for evaluation to rule out any cardiac cause of her fatigue.

## 2020-12-09 NOTE — Assessment & Plan Note (Signed)
Encouraged increased hydration and fiber in diet. Daily probiotics. If bowels not moving can use MOM 2 tbls po in 4 oz of warm prune juice by mouth every 2-3 days. If no results then repeat in 4 hours with  Dulcolax suppository pr, may repeat again in 4 more hours as needed. Seek care if symptoms worsen. Consider daily Miralax and/or Dulcolax if symptoms persist.  

## 2020-12-09 NOTE — Assessment & Plan Note (Signed)
On Levothyroxine, continue to monitor 

## 2020-12-09 NOTE — Assessment & Plan Note (Signed)
[id number] acceptable, minimize simple carbs. Increase exercise as tolerated.  

## 2020-12-09 NOTE — Progress Notes
PATIENT:Alice Saunders  AVW:[id number]  DOB: June 22, 1956  DATE OF SERVICE: 12/10/20    GENERAL SURGERY POST-OP TELEMEDICINE ENCOUNTER    Patient Consent to Telehealth   The patient agreed to participate in the video visit prior to joining the visit.        HPI:     Alice Saunders is a 65 y.o. female with a history of GERD (at least 25 years) and a hiatal hernia, now s/p laparoscopic repair of hiatal hernia and Toupe fundoplication on 11/15/20.     Subjective     Since the last visit on 3/1, has had at least 3-4 episodes of sudden onset of extreme fatigue/weakness/hot flushes/cold sweats, followed by craving for cold food or drink about 20-30 minutes later. These episodes occurred in the later part of the afternoon/evening time, shortly after a meal.  No associated diarrhea. Had one meal in [place] about 1 week ago and developed diarrhea shortly after, x 20+ times thoughout the night, then resolved spontaneously. Has been feeling fatigued this whole past week. Saw her PCP and Endocrinology with no significant findings. Wonders if she has a dumping syndrome. BP has been running in 90-100's/60-70's, HR 70-80's. Lost about 5 lbs since the surgery.     Denies dysphagia, reflux, N/V, or diarrhea.      Objective     Home medications:     Current Outpatient Medications   Medication Sig   ??? acetaminophen 325 mg tablet Take 2 tablets (650 mg total) by mouth every six (6) hours as needed for Pain.   ??? cholecalciferol 10 mcg (400 units) tablet Take 5,000 mcg by mouth daily.   ??? escitalopram 10 mg tablet Take 10 mg by mouth daily.   ??? esomeprazole 40 mg capsule Take 40 mg by mouth every morning before breakfast.   ??? levothyroxine (SYNTHROID) 100 mcg tablet Take 100 mcg by mouth daily.   ??? ondansetron ODT 4 mg disintegrating tablet Take 1 tablet (4 mg total) by mouth every six (6) hours as needed for Nausea or Vomiting.   ??? oxyCODONE 5 mg tablet Take one-half tablet (2.5 mg total) by mouth every six (6) hours as needed for Moderate Pain (Pain Scale 4-6) or Severe Pain (Pain Scale 7-10). Max Daily Amount: 10 mg   ??? pantoprazole 40 mg DR tablet Take 1 tablet (40 mg total) by mouth daily.   ??? prochlorperazine 10 mg tablet Take 1 tablet (10 mg total) by mouth every six (6) hours as needed for Nausea or Vomiting.   ??? rosuvastatin 5 mg tablet Take 5 mg by mouth at bedtime.   ??? SUMAtriptan 50 mg tablet Take 1 tablet (50 mg total) by mouth every two (2) hours as needed for Migraine (Do not take more than 2 tablets per day).     No current facility-administered medications for this visit.     Assessment and Plan:   Alice Saunders is a 65 y.o. female with a history of GERD (at least 25 years) and a hiatal hernia, now s/p laparoscopic repair of hiatal hernia and Toupe fundoplication on 11/15/20. Not likely having dumping syndrome. Still recovering from surgery. Needs to consume more calories and more protein. Needs to improve hydration (4 L/day). Advance diet as tolerated. Follow up in 2 weeks.  The telemedicine encounter was conducted along with the attending physician, Dr. Kennith Center, who agrees with assessment and plan listed above.  Author:    Beryle Beams, NP  General Surgery  [phone]  I have seen this patient and agree with the assessment and plan.

## 2020-12-10 ENCOUNTER — Telehealth: Payer: PRIVATE HEALTH INSURANCE

## 2020-12-10 ENCOUNTER — Ambulatory Visit (INDEPENDENT_AMBULATORY_CARE_PROVIDER_SITE_OTHER): Payer: Managed Care, Other (non HMO) | Admitting: Internal Medicine

## 2020-12-10 ENCOUNTER — Encounter: Payer: Self-pay | Admitting: Internal Medicine

## 2020-12-10 ENCOUNTER — Other Ambulatory Visit: Payer: Self-pay

## 2020-12-10 VITALS — BP 128/80 | HR 84 | Ht 66.0 in | Wt 174.0 lb

## 2020-12-10 DIAGNOSIS — R7303 Prediabetes: Secondary | ICD-10-CM

## 2020-12-10 DIAGNOSIS — E559 Vitamin D deficiency, unspecified: Secondary | ICD-10-CM

## 2020-12-10 DIAGNOSIS — C73 Malignant neoplasm of thyroid gland: Secondary | ICD-10-CM

## 2020-12-10 DIAGNOSIS — E890 Postprocedural hypothyroidism: Secondary | ICD-10-CM | POA: Diagnosis not present

## 2020-12-10 DIAGNOSIS — E782 Mixed hyperlipidemia: Secondary | ICD-10-CM | POA: Diagnosis not present

## 2020-12-10 DIAGNOSIS — M8589 Other specified disorders of bone density and structure, multiple sites: Secondary | ICD-10-CM

## 2020-12-10 DIAGNOSIS — K911 Postgastric surgery syndromes: Secondary | ICD-10-CM

## 2020-12-10 LAB — TSH: TSH: 1.12 u[IU]/mL (ref 0.35–4.50)

## 2020-12-10 LAB — T4, FREE: Free T4: 0.89 ng/dL (ref 0.60–1.60)

## 2020-12-10 NOTE — Progress Notes (Signed)
Patient ID: [name], female   DOB: [birth year], 65 y.o.   MRN: [id number]   This visit occurred during the SARS-CoV-2 public health emergency.  Safety protocols were in place, including screening questions prior to the visit, additional usage of staff PPE, and extensive cleaning of exam room while observing appropriate contact time as indicated for disinfecting solutions.   HPI  [name] is a 64 y.o.-year-old female, presenting for follow-up for follicular variant of papillary thyroid cancer and postsurgical hypothyroidism.  Last visit 10 months ago. She moved to [place]  from [place], where she was followed by endocrinology (reviewed notes by Dr. Virgina Ryan). Prev. Seen by Dr. Forde Dandy and then Coats in town.  Interim history: She had hiatal hernia sx. + fundoplication 3 weeks ago (11/15/2020) in [place]. She developed fatigue, exhaustion, sleepy, tremors, heat, sweating, craving ice, diarrhea.  These happen usually soon after a meal (within 20 minutes).  She also has fatigue in the morning, even without eating.  She started to drink juice-sips throughout the day as she was advised to do so at the time of surgery.  Thyroid cancer: -Diagnosed in 2012  Reviewed thyroid cancer history:   04/30/2017: total thyroidectomy - Dr [name]:     - Hurthle cell changes  2012: RAI Tx: 100 mci I131  02/06/2014: Neck ultrasound: negative for metastasis per review of previous endocrinologist notes.  However, there were 2 small rounded lymph nodes in the left neck, measuring 5.1 x 2.5 mm and 3.8 x 3.6 mm.  They did not have visible hilar lines.  02/25/2018: Neck ultrasound: No residual thyroid tissue or enlarged lymph nodes  Thyroglobulin levels reviewed: Lab Results  Component Value Date   THYROGLB <0.1 (L) 02/14/2020   THYROGLB <0.1 (L) 02/16/2019   THYROGLB <0.1 (L) 02/11/2018   THGAB <1 02/14/2020   THGAB <1 02/16/2019   THGAB <1 02/11/2018  01/23/2016: Tg <0.1 (IMA),  ATA <1.0, TSH 0.186 02/06/2014: Tg <0.1, ATA <1.0, TSH 0.707 09/01/2011: Tg <0.5, ATA <20, TSH 0.140  Postsurgical hypothyroidism:  She is on Synthroid 100 mcg daily: - in am - fasting - at least 30 min from b'fast - no Fe - no Ca, MVI - + PPIs later in the day - + on 30 mcg biotin (B complex) - added since last OV  Review TFTs: Lab Results  Component Value Date   TSH 0.54 11/04/2020   TSH 0.96 10/07/2020   TSH 2.16 02/14/2020   TSH 0.37 08/17/2019   TSH 0.26 (L) 02/16/2019   TSH 0.58 01/10/2018   TSH 0.76 08/07/2016   TSH 2.59 03/23/2016   TSH 1.15 09/03/2015   TSH 1.15 07/16/2014   FREET4 1.12 11/04/2020   FREET4 1.13 10/07/2020   FREET4 0.97 02/14/2020   FREET4 1.10 08/17/2019   FREET4 1.13 02/16/2019   FREET4 0.87 01/10/2018    Pt denies: - feeling nodules in neck - hoarseness - dysphagia - choking - SOB with lying down  He has a history of esophageal dilations x2.  She has + FH of thyroid disorders in: MGM (goiter); parathyroid ds. In M. No FH of thyroid cancer. No h/o radiation tx to head or neck.  No herbal supplements. No recent steroids use.   Prediabetes:  Reviewed HbA1c levels: Lab Results  Component Value Date   HGBA1C 5.9 10/07/2020   HGBA1C 5.7 02/14/2020   HGBA1C 5.3 08/17/2019   HGBA1C 5.9 02/16/2019   HGBA1C 5.1 08/15/2018   HGBA1C 6.0 04/05/2018   HGBA1C  5.9 11/30/2017   HGBA1C 6.0 01/29/2017   HGBA1C 5.8 08/07/2016   HGBA1C 5.9 02/21/2016   Father with DM2.  Osteopenia.   She was previously on Evista but she could not tolerate it due to headaches. I did suggest Fosamax in the past but she did not want to start any medications at that time.  She does have a significant history of GERD.  [facility] L1-L4: -2.0 RFN: -1.9 LFN: -1.2 This was worse compared to DXA scan report from 08/15/2014: L1-L4: -1.6 RFN: -0.7 LFN: -1.2  Hyperlipidemia:  Reviewed latest lipid panel:    Component  Value Date/Time   CHOL 193 10/07/2020 0908   TRIG 92.0 10/07/2020 0908   HDL 58.00 10/07/2020 0908   CHOLHDL 3 10/07/2020 0908   VLDL 18.4 10/07/2020 0908   LDLCALC 117 (H) 10/07/2020 0908   LDLDIRECT 212.6 08/10/2012 1012  She is on Crestor 5 mg daily  She was previously on 50,000 units vitamin D weekly, but not consistent with this.  Now on 2000 units vitamin D daily.  Lab Results  Component Value Date   VD25OH 55.05 10/07/2020   VD25OH 45.0 02/14/2020   VD25OH 33.96 08/17/2019   VD25OH 31.72 02/16/2019   VD25OH 42.10 08/15/2018   VD25OH 34.66 04/05/2018   VD25OH 51.27 01/29/2017   VD25OH 36.00 11/05/2016   VD25OH 25.11 (L) 08/07/2016   VD25OH 39.41 07/16/2014   She continues to walk for exercise.  She separated from her husband for 30 years last year. He has Renal cell CA and  ESRD.  ROS: Constitutional: no weight gain/+ weight loss, + fatigue, no subjective hyperthermia, no subjective hypothermia Eyes: no blurry vision, no xerophthalmia ENT: no sore throat, + see HPI Cardiovascular: no CP/no SOB/no palpitations/no leg swelling Respiratory: no cough/no SOB/no wheezing Gastrointestinal: no N/no V/+ D/no C/no acid reflux Musculoskeletal: no muscle aches/no joint aches Skin: no rashes, no hair loss Neurological: no tremors/no numbness/no tingling/no dizziness  I reviewed pt's medications, allergies, PMH, social hx, family hx, and changes were documented in the history of present illness. Otherwise, unchanged from my initial visit note.  Past Medical History:  Diagnosis Date  . Acute upper respiratory infection 09/13/2016  . Allergic rhinitis   . Allergy   . Anxiety state 08/24/2016  . Barrett's esophagus without dysplasia    pt says was suspected never confirmed 9 or 10 years ago  . Cancer [facility])    thyroid cancer  . Depression   . Esophagitis   . Fundic gland polyps of stomach, benign   . GERD (gastroesophageal reflux disease)   . Hemorrhoids   . History of  hiatal hernia   . History of ovarian cyst   . History of thyroid cancer    05-01-2011  s/p  total thyroidectomy for multinodular goiter and papillary carcinoma  . Hyperglycemia 03/01/2016  . Hyperlipidemia    borderline  . Hypothyroidism, postsurgical   . Internal hemorrhoids   . Lumbar spondylosis    L4 -- S1  . Osteoporosis    osteopenia  . Overweight 08/14/2009   Qualifier: Diagnosis of  By: Niel Hummer MD, Lorinda Creed   . Palpitations 03/01/2016  . PONV (postoperative nausea and vomiting)    nausea with pain meds  . TMJ (temporomandibular joint disorder)    Past Surgical History:  Procedure Laterality Date  . APPENDECTOMY  01-30-2008  . CARPAL TUNNEL RELEASE Right 08/30/2014   Procedure: RIGHT CARPAL TUNNEL RELEASE;  Surgeon: Linna Hoff,  MD;  Location: [facility];  Service: Orthopedics;  Laterality: Right;  . COLONOSCOPY  08-08-2007  . HEMORRHOID SURGERY    . LAPAROSCOPIC RIGHT OOPHORECTOMY  1996  . LAPAROSCOPIC TUBAL LIGATION  07-21-2002   W/   Dilatation and evacuation  . LEFT SHOULDER CLOSED MANIPULATION/  ARTHROSCOPIC DEBRIDEMENT LABRUM/  SAD/ RELEASE  CA LIGAMENT/ BURSECTOMY  10-12-2011  . PARTIAL KNEE ARTHROPLASTY Right 12/09/2015   Procedure: RIGHT KNEE MEDIAL UNICOMPARTMENTAL ARTHROPLASTY ;  Surgeon: Gaynelle Arabian, MD;  Location: [facility];  Service: Orthopedics;  Laterality: Right;  . TOTAL THYROIDECTOMY  05-01-2011  . TRANSOBTURATOR SLING  01-07-2005   W/ CYSTOCELE REPAIR  . UPPER GASTROINTESTINAL ENDOSCOPY  last one 06-29-2014   Social History   Socioeconomic History  . Marital status: Married    Spouse name: Not on file  . Number of children: 2  . Years of education: Not on file  . Highest education level: Not on file  Occupational History  . Occupation: realtor  Tobacco Use  . Smoking status: Former Smoker    Years: 5.00    Types: Cigarettes    Start date: 09/28/1984    Quit date: 08/28/1985    Years since quitting: 35.3  . Smokeless  tobacco: Never Used  Vaping Use  . Vaping Use: Never used  Substance and Sexual Activity  . Alcohol use: Yes    Comment: rarely  . Drug use: No  . Sexual activity: Not on file  Other Topics Concern  . Not on file  Social History Narrative  . Not on file   Social Determinants of Health   Financial Resource Strain: Not on file  Food Insecurity: Not on file  Transportation Needs: Not on file  Physical Activity: Not on file  Stress: Not on file  Social Connections: Not on file  Intimate Partner Violence: Not on file   Current Outpatient Medications on File Prior to Visit  Medication Sig Dispense Refill  . AMBULATORY NON FORMULARY MEDICATION Medication Name: Nitroglycerin compound 0.125% apply pea size amount into rectum twice daily x 4 to 6 weeks 30 g 1  . Calcium Citrate-Vitamin D (CITRACAL + D PO) Take 2 tablets by mouth daily. 1264m    . cyclobenzaprine (FLEXERIL) 5 MG tablet Take 1 tablet (5 mg total) by mouth at bedtime. 2 tablet 0  . escitalopram (LEXAPRO) 10 MG tablet Take 1 tablet by mouth daily.  2  . esomeprazole (NEXIUM) 20 MG capsule TAKE 1 CAPSULE TWICE DAILY BEFORE MEALS 60 capsule 0  . esomeprazole (NEXIUM) 40 MG capsule TAKE 1 CAPSULE (40 MG) BY MOUTH 2 TIMES DAILY BEFORE MEAL 60 capsule 0  . MegaRed Omega-3 Krill Oil 350 MG CAPS Take 1 capsule by mouth daily.    . Probiotic Product (PROBIOTIC PO) Take 1 capsule by mouth as needed. Lucky Probiotic    . rosuvastatin (CRESTOR) 5 MG tablet Take 1 tablet (5 mg total) by mouth every other day. 45 tablet 1  . sucralfate (CARAFATE) 1 g tablet Take 1 tablet (1 g total) by mouth 2 (two) times daily as needed. 60 tablet 0  . SYNTHROID 100 MCG tablet TAKE 1 TABLET ONCE DAILY BEFORE BREAKFAST. 30 tablet 6  . Vitamin D, Cholecalciferol, 50 MCG (2000 UT) CAPS Take 1 capsule by mouth daily.     No current facility-administered medications on file prior to visit.   Allergies  Allergen Reactions  . Celestone [Betamethasone  Sodium Phosphate] Other (See Comments)    "Feels hot  and looks sunburned"  . Droperidol Other (See Comments)    "feels like I have withdrawal symptoms"  . Omni-Pac [Cefdinir] Diarrhea and Other (See Comments)    Other Reaction: Other reaction  . Statins Other (See Comments)    "joint stiffness"  . Hydrocodone Itching and Rash   Family History  Problem Relation Age of Onset  . Diabetes Father   . Cancer Father        prostate  . Stroke Father        bacterial endocarditis after prostate surgery  . Heart disease Father        aortic valvular disease, MI  . Thyroid disease Mother        hyperparathyroidism  . Alzheimer's disease Mother   . Hyperparathyroidism Mother   . Atrial fibrillation Mother   . Hypertension Mother   . Hyperlipidemia Mother   . Thyroid disease Maternal Grandmother   . Hyperlipidemia Brother   . Hypertension Brother   . Allergies Brother   . Heart disease Daughter        palpitations  . Esophageal cancer Neg Hx   . Colon cancer Neg Hx   . Colon polyps Neg Hx   . Rectal cancer Neg Hx   . Stomach cancer Neg Hx     PE: BP 128/80 (BP Location: Right Arm, Patient Position: Sitting, Cuff Size: Normal)   Pulse 84   Ht 5' 6" (1.676 m)   Wt 174 lb (78.9 kg)   SpO2 97%   BMI 28.08 kg/m  Wt Readings from Last 3 Encounters:  12/10/20 174 lb (78.9 kg)  12/05/20 173 lb 3.2 oz (78.6 kg)  11/04/20 179 lb 12.8 oz (81.6 kg)   Constitutional: overweight, in NAD Eyes: PERRLA, EOMI, no exophthalmos ENT: moist mucous membranes, no neck masses palpable, no cervical lymphadenopathy Cardiovascular: RRR, No MRG Respiratory: CTA B Gastrointestinal: abdomen soft, NT, ND, BS+ Musculoskeletal: no deformities, strength intact in all 4 Skin: moist, warm, no rashes Neurological: no tremor with outstretched hands, DTR normal in all 4  ASSESSMENT: 1. Thyroid cancer - see HPI  2. Postsurgical Hypothyroidism  3. Prediabetes  4. HL  5. Osteopenia  6. Vitamin D  def.  7.  Dumping syndrome  PLAN:  1. Follicular variant of papillary thyroid cancer  -Patient with stage I TNM PTC.  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  This is a slow-growing cancer with good prognosis so her life expectancy is unlikely to be reduced by this diagnosis.  Her cancer was encapsulated, subcentimeter, without lymphovascular invasion, which further improves her prognosis.  She had capsular invasion, though, but this still qualifies as low risk per the established thyroid cancer guidelines. -She denies neck compression symptoms and I did not feel any masses palpating her neck -No suspicious masses on her latest neck ultrasound from 01/2018 -Latest thyroglobulin and ATA antibodies were reviewed and were undetectable -We will recheck this today  2. Patient with history of total thyroidectomy for cancer, now with iatrogenic hypothyroidism, on levothyroxine therapy. - latest thyroid labs reviewed with pt >> normal: Lab Results  Component Value Date   TSH 0.54 11/04/2020   - she continues on Synthroid d.a.w. 100 mcg daily - pt feels good on this dose but does complain of fatigue lately - we discussed about taking the thyroid hormone every day, with water, >30 minutes before breakfast, separated by >4 hours from acid reflux medications,  calcium, iron, multivitamins. Pt. is taking it correctly. -We will recheck the TFTs today just to see if she had any malabsorption of levothyroxine since her surgery, which could have contributed to her fatigue  3. Prediabetes -Good control -Latest HbA1c was 5.9%, slightly higher, in 09/2020. -We will continue to keep an eye on this.  4. HL -Reviewed latest lipid panel from 09/2020: LDL decreased from 164 >> 117, much improved, the rest of the fractions at goal: Lab Results  Component Value Date   CHOL 193 10/07/2020   HDL 58.00 10/07/2020   LDLCALC 117 (H) 10/07/2020    LDLDIRECT 212.6 08/10/2012   TRIG 92.0 10/07/2020   CHOLHDL 3 10/07/2020  -At last visit, she was on Crestor 5 mg twice a week, but taking it inconsistently.  I advised her to move it every day and not to miss doses.  I also recommended to cut down fatty and processed foods, and concentrated sweets and increase fiber.  5. Osteopenia -Her T-scores were much worse in 09/2017 so we discussed about starting Fosamax but she had significant GERD so I advised her to consider parenteral medications.  She wanted to avoid antiresorptive medications at that time. -[facility]Dr[facility] Corinna Capra): Reportedly much better -She is due for another DXA scan -In the past, I also suggested to switch from calcium carbonate to calcium citrate for better absorption in the setting of PPI use  6.  Vitamin D deficiency -Latest level was normal in 09/2020: 25 -She continues on vitamin D supplement: 2000 units daily but stopped the Citracal  7.  Dumping syndrome -I agree with her that her symptoms (please see HPI) are reminiscent of gastric surgery-related dumping syndrome (probably early).  I advised her to discuss with surgery about further plan.  She has an appointment with them later this p.m. (virtual).  She will probably need a referral to nutrition.   -For now, I advised her to stop sipping juice and I explained that this may exacerbate the problem.  Also, recommended not to drink liquids with meals and to try to stay with solid meals. -I also recommended to get an over-the-counter glucometer and check her blood sugars whenever she feels poorly, as she may have hypoglycemia, however, I doubt that this is the cause of her symptoms.  Component     Latest Ref Rng & Units 12/10/2020  Thyroglobulin     ng/mL <0.1 (L)  Comment        TSH     0.35 - 4.50 uIU/mL 1.12  T4,Free(Direct)     0.60 - 1.60 ng/dL 0.89  Thyroglobulin Ab     < or = 1 IU/mL <1  Thyroid tests are all at  goal.  Philemon Kingdom, MD PhD [facility] Endocr[facility]

## 2020-12-10 NOTE — Patient Instructions (Addendum)
Please stop at the lab.  Please continue Synthroid 100 mcg daily.  Take the thyroid hormone every day, with water, at least 30 minutes before breakfast, separated by at least 4 hours from: - acid reflux medications - calcium - iron - multivitamins  STOP juice.   Please come back for a follow-up appointment in 1 year.

## 2020-12-11 LAB — THYROGLOBULIN LEVEL: Thyroglobulin: <0.1 ng/mL — ABNORMAL LOW

## 2020-12-11 LAB — THYROGLOBULIN ANTIBODY: Thyroglobulin Ab: <1 IU/mL (ref <=1)

## 2020-12-12 ENCOUNTER — Encounter: Payer: Self-pay | Admitting: Internal Medicine

## 2020-12-23 ENCOUNTER — Other Ambulatory Visit: Payer: Self-pay

## 2020-12-23 ENCOUNTER — Emergency Department (HOSPITAL_COMMUNITY)
Admission: EM | Admit: 2020-12-23 | Discharge: 2020-12-24 | Disposition: A | Discharge status: Home / Self Care | Payer: Managed Care, Other (non HMO) | Attending: Emergency Medicine | Admitting: Emergency Medicine

## 2020-12-23 ENCOUNTER — Encounter (HOSPITAL_COMMUNITY): Payer: Self-pay | Admitting: Emergency Medicine

## 2020-12-23 DIAGNOSIS — R112 Nausea with vomiting, unspecified: Secondary | ICD-10-CM | POA: Diagnosis not present

## 2020-12-23 DIAGNOSIS — Z85850 Personal history of malignant neoplasm of thyroid: Secondary | ICD-10-CM | POA: Insufficient documentation

## 2020-12-23 DIAGNOSIS — E039 Hypothyroidism, unspecified: Secondary | ICD-10-CM | POA: Insufficient documentation

## 2020-12-23 DIAGNOSIS — Z87891 Personal history of nicotine dependence: Secondary | ICD-10-CM | POA: Insufficient documentation

## 2020-12-23 DIAGNOSIS — Z20822 Contact with and (suspected) exposure to covid-19: Secondary | ICD-10-CM | POA: Insufficient documentation

## 2020-12-23 DIAGNOSIS — R509 Fever, unspecified: Secondary | ICD-10-CM | POA: Diagnosis not present

## 2020-12-23 DIAGNOSIS — M542 Cervicalgia: Secondary | ICD-10-CM | POA: Diagnosis not present

## 2020-12-23 DIAGNOSIS — D72829 Elevated white blood cell count, unspecified: Secondary | ICD-10-CM | POA: Diagnosis not present

## 2020-12-23 DIAGNOSIS — R7401 Elevation of levels of liver transaminase levels: Secondary | ICD-10-CM | POA: Diagnosis not present

## 2020-12-23 DIAGNOSIS — Z79899 Other long term (current) drug therapy: Secondary | ICD-10-CM | POA: Insufficient documentation

## 2020-12-23 DIAGNOSIS — R519 Headache, unspecified: Secondary | ICD-10-CM | POA: Insufficient documentation

## 2020-12-23 DIAGNOSIS — K219 Gastro-esophageal reflux disease without esophagitis: Secondary | ICD-10-CM

## 2020-12-23 DIAGNOSIS — Z09 Encounter for follow-up examination after completed treatment for conditions other than malignant neoplasm: Secondary | ICD-10-CM

## 2020-12-23 LAB — CBC WITH DIFFERENTIAL/PLATELET
Abs Immature Granulocytes: 0.08 10*3/uL — ABNORMAL HIGH (ref 0.00–0.07)
Basophils Absolute: 0 10*3/uL (ref 0.0–0.1)
Basophils Relative: 0 %
Eosinophils Absolute: 0.4 10*3/uL (ref 0.0–0.5)
Eosinophils Relative: 3 %
HCT: 41.7 % (ref 36.0–46.0)
Hemoglobin: 13.9 g/dL (ref 12.0–15.0)
Immature Granulocytes: 1 %
Lymphocytes Relative: 4 %
Lymphs Abs: 0.6 10*3/uL — ABNORMAL LOW (ref 0.7–4.0)
MCH: 30.1 pg (ref 26.0–34.0)
MCHC: 33.3 g/dL (ref 30.0–36.0)
MCV: 90.3 fL (ref 80.0–100.0)
Monocytes Absolute: 0.9 10*3/uL (ref 0.1–1.0)
Monocytes Relative: 6 %
Neutro Abs: 12.6 10*3/uL — ABNORMAL HIGH (ref 1.7–7.7)
Neutrophils Relative %: 86 %
Platelets: 221 10*3/uL (ref 150–400)
RBC: 4.62 MIL/uL (ref 3.87–5.11)
RDW: 13.1 % (ref 11.5–15.5)
WBC: 14.5 10*3/uL — ABNORMAL HIGH (ref 4.0–10.5)
nRBC: 0 % (ref 0.0–0.2)

## 2020-12-23 MED ORDER — ONDANSETRON 4 MG PO TBDP
4.0000 mg | ORAL_TABLET | Freq: Once | ORAL | Status: AC
  Administered 2020-12-23: 4 mg via ORAL
  Filled 2020-12-23: qty 1

## 2020-12-23 NOTE — ED Triage Notes (Signed)
Patient reports multiple emesis this evening , denies abdominal pain or diarrhea , hernia surgery last 11/15/20.

## 2020-12-23 NOTE — Progress Notes
PATIENT:Alice Saunders  ZOX:[id number]  DOB: 09/24/1956  DATE OF SERVICE: 12/24/20    GENERAL SURGERY POST-OP TELEMEDICINE ENCOUNTER    Patient Consent to Telehealth   The patient agreed to participate in the video visit prior to joining the visit.        HPI:     Alice Saunders is a 65 y.o. female with a history of GERD (at least 25 years) and a hiatal hernia, now s/p laparoscopic repair of hiatal hernia and Toupe fundoplication on 11/15/20. Last seen in follow up on 12/10/20 with complaints of fatigue. Was recommended to improve hydration and consume small frequent meals.    Subjective                Objective     Home medications:     Current Outpatient Medications   Medication Sig   ??? cholecalciferol 10 mcg (400 units) tablet Take 5,000 mcg by mouth daily.   ??? escitalopram 10 mg tablet Take 10 mg by mouth daily.   ??? esomeprazole 40 mg capsule Take 40 mg by mouth every morning before breakfast.   ??? levothyroxine (SYNTHROID) 100 mcg tablet Take 100 mcg by mouth daily.   ??? oxyCODONE 5 mg tablet Take one-half tablet (2.5 mg total) by mouth every six (6) hours as needed for Moderate Pain (Pain Scale 4-6) or Severe Pain (Pain Scale 7-10). Max Daily Amount: 10 mg   ??? pantoprazole 40 mg DR tablet Take 1 tablet (40 mg total) by mouth daily.   ??? prochlorperazine 10 mg tablet Take 1 tablet (10 mg total) by mouth every six (6) hours as needed for Nausea or Vomiting.   ??? rosuvastatin 5 mg tablet Take 5 mg by mouth at bedtime.     No current facility-administered medications for this visit.     Assessment and Plan:   Alice Saunders is a 65 y.o. female with a history of GERD (at least 25 years) and a hiatal hernia, now s/p laparoscopic repair of hiatal hernia and Toupe fundoplication on 11/15/20.       The telemedicine encounter was conducted along with the attending physician, Dr. Kennith Center, who agrees with assessment and plan listed above.  Author:    Beryle Beams, NP  General Surgery  [phone]      I have seen this patient and agree with the assessment and plan.

## 2020-12-24 ENCOUNTER — Telehealth: Payer: Commercial Managed Care - Pharmacy Benefit Manager

## 2020-12-24 ENCOUNTER — Emergency Department (HOSPITAL_COMMUNITY): Payer: Managed Care, Other (non HMO)

## 2020-12-24 ENCOUNTER — Telehealth: Payer: Self-pay | Admitting: Gastroenterology

## 2020-12-24 LAB — COMPREHENSIVE METABOLIC PANEL
ALT: 47 U/L — ABNORMAL HIGH (ref 0–44)
AST: 53 U/L — ABNORMAL HIGH (ref 15–41)
Albumin: 4.2 g/dL (ref 3.5–5.0)
Alkaline Phosphatase: 108 U/L (ref 38–126)
Anion gap: 8 (ref 5–15)
BUN: 14 mg/dL (ref 8–23)
CO2: 24 mmol/L (ref 22–32)
Calcium: 8.7 mg/dL — ABNORMAL LOW (ref 8.9–10.3)
Chloride: 104 mmol/L (ref 98–111)
Creatinine, Ser: 0.69 mg/dL (ref 0.44–1.00)
GFR, Estimated: >60 mL/min (ref >60)
Glucose, Bld: 151 mg/dL — ABNORMAL HIGH (ref 70–99)
Potassium: 3.5 mmol/L (ref 3.5–5.1)
Sodium: 136 mmol/L (ref 135–145)
Total Bilirubin: 0.6 mg/dL (ref 0.3–1.2)
Total Protein: 7 g/dL (ref 6.5–8.1)

## 2020-12-24 LAB — URINALYSIS, ROUTINE W REFLEX MICROSCOPIC
Bilirubin Urine: NEGATIVE
Glucose, UA: NEGATIVE mg/dL
Hgb urine dipstick: NEGATIVE
Ketones, ur: 20 mg/dL — AB
Leukocytes,Ua: NEGATIVE
Nitrite: NEGATIVE
Protein, ur: NEGATIVE mg/dL
Specific Gravity, Urine: >1.046 — ABNORMAL HIGH (ref 1.005–1.030)
pH: 5 (ref 5.0–8.0)

## 2020-12-24 LAB — RESP PANEL BY RT-PCR (FLU A&B, COVID) ARPGX2
Influenza A by PCR: NEGATIVE
Influenza B by PCR: NEGATIVE
SARS Coronavirus 2 by RT PCR: NEGATIVE

## 2020-12-24 LAB — LIPASE, BLOOD: Lipase: 26 U/L (ref 11–51)

## 2020-12-24 LAB — LACTIC ACID, PLASMA: Lactic Acid, Venous: 1.7 mmol/L (ref 0.5–1.9)

## 2020-12-24 MED ORDER — ACETAMINOPHEN 325 MG PO TABS
650.0000 mg | ORAL_TABLET | Freq: Once | ORAL | Status: AC
  Administered 2020-12-24: 650 mg via ORAL
  Filled 2020-12-24: qty 2

## 2020-12-24 MED ORDER — METOCLOPRAMIDE HCL 5 MG/ML IJ SOLN
10.0000 mg | Freq: Once | INTRAMUSCULAR | Status: AC
  Administered 2020-12-24: 10 mg via INTRAVENOUS
  Filled 2020-12-24: qty 2

## 2020-12-24 MED ORDER — SODIUM CHLORIDE 0.9 % IV BOLUS
1000.0000 mL | Freq: Once | INTRAVENOUS | Status: AC
  Administered 2020-12-24: 1000 mL via INTRAVENOUS

## 2020-12-24 MED ORDER — ONDANSETRON 4 MG PO TBDP: 4.0000 mg | ORAL_TABLET | Freq: Three times a day (TID) | ORAL | 0 refills | Status: AC | PRN

## 2020-12-24 MED ORDER — IOHEXOL 300 MG/ML  SOLN
100.0000 mL | Freq: Once | INTRAMUSCULAR | Status: AC | PRN
  Administered 2020-12-24: 100 mL via INTRAVENOUS

## 2020-12-24 NOTE — ED Notes (Signed)
Pt given crackers and drink 

## 2020-12-24 NOTE — ED Provider Notes (Signed)
Care of patient assumed from Arnold Line at 0700.  Agree with history, physical exam and plan.  See their note for further details. Briefly, patient had laparoscopic repair of hiatal hernia and Toupe fundoplication on 4/40/1027 at [facility].  Reports that since then she has had intermittent bouts of nausea.  Presents today with chief complaint of multiple episodes of nonbloody nonbilious vomiting.  Nausea vomiting started after eating a meal.    Patient currently on day 5 of Macrobid course for UTI after she presented to her primary care provider with urinary hesitancy.  Patient reports resolution of her urinary hesitancy and denies any GU symptoms at this time.  Patient also endorses intermittent neck pain and headache since her surgery in February.  Patient endorsed headache and neck pain earlier in emergency department visit.  Reports that symptoms are consistent with previous episodes she has experienced.   Physical Exam  BP 117/72   Pulse 87   Temp 98.9 F (37.2 C) (Oral)   Resp 20   Ht 5\' 7"  (1.702 m)   Wt 82 kg   SpO2 91%   BMI 28.31 kg/m   Physical Exam Vitals and nursing note reviewed.  Constitutional:      General: She is not in acute distress.    Appearance: She is not ill-appearing, toxic-appearing or diaphoretic.  HENT:     Head: Normocephalic.  Eyes:     General: No scleral icterus.       Right eye: No discharge.        Left eye: No discharge.  Cardiovascular:     Rate and Rhythm: Normal rate.     Heart sounds: Normal heart sounds.  Pulmonary:     Effort: Pulmonary effort is normal. No tachypnea, bradypnea or respiratory distress.     Breath sounds: Normal breath sounds. No stridor. No decreased breath sounds, wheezing, rhonchi or rales.  Abdominal:     General: A surgical scar is present. Bowel sounds are normal.     Palpations: Abdomen is soft. There is no mass or pulsatile mass.     Tenderness: There is no abdominal tenderness. There is no right CVA tenderness,  left CVA tenderness, guarding or rebound.     Hernia: There is no hernia in the umbilical area or ventral area.     Comments: Laparoscopic surgical incisions noted, wounds are dry, intact, and healing no surrounding erythema or purulent discharge noted  Musculoskeletal:     Cervical back: Neck supple.     Right lower leg: No swelling, deformity, lacerations, tenderness or bony tenderness. No edema.     Left lower leg: No swelling, deformity, lacerations, tenderness or bony tenderness. No edema.  Skin:    General: Skin is warm and dry.     Coloration: Skin is not jaundiced or pale.  Neurological:     General: No focal deficit present.     Mental Status: She is alert.     GCS: GCS eye subscore is 4. GCS verbal subscore is 5. GCS motor subscore is 6.  Psychiatric:        Behavior: Behavior is cooperative.     ED Course/Procedures     Procedures  MDM   Patient was examined by previous provider as well as attending physician.  Patient was noted to be febrile with temperature of 100.6 F upon arrival in emergency department.  Per previous provider when temperature was rechecked prior to giving Tylenol was found to be 98.9 F.  CBC showed leukocytosis at  14.5. BMP showed mild elevation in transaminases with AST of 53 and ALT of 47. Lipase within normal limits. Lactic acid within normal limits. Covid and influenza testing negative. Chest x-ray shows no acute cardiopulmonary disease. Blood culture pending.  CT abdomen pelvis with contrast showed no acute abnormality.  Due to patient's mild elevation in transaminases right upper quadrant scan was ordered and is pending at this time.  Due to patient's recent UTI we will recheck urinalysis as well as send urine for culture.  Physical exam patient has normoactive bowel sounds, abdomen soft, nondistended, nontender.  Patient has no complaints of nausea at this time is able to drink water without any difficulty.  Low suspicion for PE as  patient has no cough, hemoptysis, shortness of breath, chest pain, unilateral leg swelling or tenderness.    Ultrasound of the right upper quadrant shows no gallstones or globular wall thickening, no dilation of biliary duct, no focal lesions of liver.  Urinalysis showed no signs of infection.  Did show signs of dehydration with increased use of gravity and ketones present.  Will give patient 1 L fluid bolus.  Previously received 1 L fluid bolus earlier in her emergency department stay.    She has been able to tolerate p.o. liquids and solids without any difficulty.  Continues to deny any abdominal pain or discomfort.  No nausea or vomiting at this time.  Patient is in no acute distress.  Hemodynamically stable.  Shared decision-making with patient about home antiemetics; will prescribe ODT Zofran at this time.  Will have patient follow-up with her primary care provider as well [facility].  Patient given strict return precautions.  Patient expressed understanding of all instructions and is agreeable with this plan.     Dyann Ruddle 60/10/93 2355    Glick, David, MD 73/22/02 2240

## 2020-12-24 NOTE — Discharge Instructions (Addendum)
You came to the emergency department to be evaluated for your nausea and vomiting.  The CT scan after abdomen and pelvis showed no acute findings or abnormalities.  The ultrasound of your gallbladder and liver also showed no acute findings or abnormalities.  Your lab work showed that you have elevated liver enzymes.  Please follow-up with your primary care provider to have these labs rechecked.  Today you were given a prescription for Zofran.  Please take 1 pill every 8 hours as needed for nausea and vomiting.  Please follow-up with your gastroenterologist at [facility] for follow-up if you keep having nausea and vomiting.  Get help right away if: You have pain in your chest, neck, arm, or jaw. You feel extremely weak or you faint. You have persistent vomiting. You have vomit that is bright red or looks like black coffee grounds. You have bloody or black stools or stools that look like tar. You have a severe headache, a stiff neck, or both. You have severe pain, cramping, or bloating in your abdomen. You have difficulty breathing, or you are breathing very quickly. Your heart is beating very quickly. Your skin feels cold and clammy. You feel confused. You have signs of dehydration, such as: Dark urine, very little urine, or no urine. Cracked lips. Dry mouth. Sunken eyes. Sleepiness. Weakness.

## 2020-12-24 NOTE — ED Provider Notes (Signed)
[facility] EMERGENCY DEPARTMENT Provider Note   CSN: [id number] Arrival date & time: 12/23/20  2230     History Chief Complaint  Patient presents with  . Emesis    [name] is a 65 y.o. 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  The patient endorses multiple episodes of nonbloody, nonbilious "foamy" vomiting, onset tonight.  She reports that she is also feeling weak and dehydrated.  She also endorses intermittent neck pain and a headache, but reports that this has been problematic since her surgery in February.  She does endorse a headache and neck pain at this time, but states that symptoms are not worse today.  States that these seem related to intubation. She denies abdominal pain, flank pain, constipation, chest pain, shortness of breath, nasal congestion, rhinorrhea.  Denies cough, but noted to be coughing in the room.  No known sick contacts.  She reports that she was recently started on a course of Macrobid for UTI after she presented with urinary hesitancy and was found to have blood in her urine on UA.  She has 2 tablets left in her course of Macrobid.  She reports urinary hesitancy has since resolved.  She does report that she has been under an increased amount of stress recently.  She has recently sold her home and is planning to move to [place] to be with her children since she has no family remaining in the area after her husband passed away in August 23, 2023.  Since her surgery in February, she was initially having some drops in her blood pressure, but these have since resolved.  She has also had some intermittent episodes of fatigue and generalized weakness as well as some changes in her  stool from brown to gold to loose yellow, but these have not changed recently.  Stool has since become more firm.  She also noted that she has had approximately 4-5 episodes of tremors and hot flashes following the surgery, but no recent episodes.  She has a surgical history of an appendectomy.  No history of kidney stones.  The history is provided by the patient and medical records. No language interpreter was used.       Past Medical History:  Diagnosis Date  . Acute upper respiratory infection 09/13/2016  . Allergic rhinitis   . Allergy   . Anxiety state 08/24/2016  . Barrett's esophagus without dysplasia    pt says was suspected never confirmed 9 or 10 years ago  . Cancer [facility])    thyroid cancer  . Depression   . Esophagitis   . Fundic gland polyps of stomach, benign   . GERD (gastroesophageal reflux disease)   . Hemorrhoids   . History of hiatal hernia   . History of ovarian cyst   . History of thyroid cancer    05-01-2011  s/p  total thyroidectomy for multinodular goiter and papillary carcinoma  . Hyperglycemia 03/01/2016  . Hyperlipidemia    borderline  . Hypothyroidism, postsurgical   . Internal hemorrhoids   . Lumbar spondylosis    L4 -- S1  . Osteoporosis    osteopenia  . Overweight 08/14/2009   Qualifier: Diagnosis of  By: Niel Hummer MD, Lorinda Creed   . Palpitations 03/01/2016  . PONV (postoperative nausea  and vomiting)    nausea with pain meds  . TMJ (temporomandibular joint disorder)     Patient Active Problem List   Diagnosis Date Noted  . Constipation 12/09/2020  . Dyspnea 04/18/2020  . ETD (eustachian tube dysfunction) 08/01/2019  . Sinusitis 08/01/2019  . Papillary thyroid carcinoma ([place]) 08/15/2018  . Preventative health care 04/13/2018  . Vitamin D deficiency 04/05/2018  . Postsurgical hypothyroidism 02/11/2018  . Osteopenia 02/11/2018  . Prediabetes 02/11/2018  . Anxiety and depression 08/24/2016  . Palpitations 03/01/2016  . OA  (osteoarthritis) of knee 12/09/2015  . External hemorrhoid, thrombosed 03/02/2014  . BENIGN POSITIONAL VERTIGO 07/30/2010  . Overweight 08/14/2009  . COLONIC POLYPS, HYPERPLASTIC, HX OF 04/17/2008  . Mixed hyperlipidemia 11/24/2007  . Acid reflux 11/24/2007  . Diaphragmatic hernia 11/24/2007    Past Surgical History:  Procedure Laterality Date  . APPENDECTOMY  01-30-2008  . CARPAL TUNNEL RELEASE Right 08/30/2014   Procedure: RIGHT CARPAL TUNNEL RELEASE;  Surgeon: Linna Hoff, MD;  Location: [facility];  Service: Orthopedics;  Laterality: Right;  . COLONOSCOPY  08-08-2007  . HEMORRHOID SURGERY    . LAPAROSCOPIC RIGHT OOPHORECTOMY  1996  . LAPAROSCOPIC TUBAL LIGATION  07-21-2002   W/   Dilatation and evacuation  . LEFT SHOULDER CLOSED MANIPULATION/  ARTHROSCOPIC DEBRIDEMENT LABRUM/  SAD/ RELEASE  CA LIGAMENT/ BURSECTOMY  10-12-2011  . PARTIAL KNEE ARTHROPLASTY Right 12/09/2015   Procedure: RIGHT KNEE MEDIAL UNICOMPARTMENTAL ARTHROPLASTY ;  Surgeon: Gaynelle Arabian, MD;  Location: [facility];  Service: Orthopedics;  Laterality: Right;  . TOTAL THYROIDECTOMY  05-01-2011  . TRANSOBTURATOR SLING  01-07-2005   W/ CYSTOCELE REPAIR  . UPPER GASTROINTESTINAL ENDOSCOPY  last one 06-29-2014     OB History   No obstetric history on file.     Family History  Problem Relation Age of Onset  . Diabetes Father   . Cancer Father        prostate  . Stroke Father        bacterial endocarditis after prostate surgery  . Heart disease Father        aortic valvular disease, MI  . Thyroid disease Mother        hyperparathyroidism  . Alzheimer's disease Mother   . Hyperparathyroidism Mother   . Atrial fibrillation Mother   . Hypertension Mother   . Hyperlipidemia Mother   . Thyroid disease Maternal Grandmother   . Hyperlipidemia Brother   . Hypertension Brother   . Allergies Brother   . Heart disease Daughter        palpitations  . Esophageal cancer Neg Hx   . Colon cancer  Neg Hx   . Colon polyps Neg Hx   . Rectal cancer Neg Hx   . Stomach cancer Neg Hx     Social History   Tobacco Use  . Smoking status: Former Smoker    Years: 5.00    Types: Cigarettes    Start date: 09/28/1984    Quit date: 08/28/1985    Years since quitting: 35.3  . Smokeless tobacco: Never Used  Vaping Use  . Vaping Use: Never used  Substance Use Topics  . Alcohol use: Yes    Comment: rarely  . Drug use: No    Home Medications Prior to Admission medications   Medication Sig Start Date End Date Taking? Authorizing Provider  AMBULATORY NON FORMULARY MEDICATION Medication Name: Nitroglycerin compound 0.125% apply pea size amount into rectum twice daily x 4 to 6 weeks 06/14/20  Mauri Pole, MD  Calcium Citrate-Vitamin D (CITRACAL + D PO) Take 2 tablets by mouth daily. 1200mg     [provider]  cyclobenzaprine (FLEXERIL) 5 MG tablet Take 1 tablet (5 mg total) by mouth at bedtime. 11/04/20   Saguier, Percell Miller, PA-C  escitalopram (LEXAPRO) 10 MG tablet Take 1 tablet by mouth daily. 12/14/17   [provider]  esomeprazole (NEXIUM) 20 MG capsule TAKE 1 CAPSULE TWICE DAILY BEFORE MEALS 08/12/20   Nandigam, Venia Minks, MD  esomeprazole (NEXIUM) 40 MG capsule TAKE 1 CAPSULE (40 MG) BY MOUTH 2 TIMES DAILY BEFORE MEAL 08/07/20   Nandigam, Venia Minks, MD  MegaRed Omega-3 Krill Oil 350 MG CAPS Take 1 capsule by mouth daily.    [provider]  Probiotic Product (PROBIOTIC PO) Take 1 capsule by mouth as needed. Lucky Probiotic    [provider]  rosuvastatin (CRESTOR) 5 MG tablet Take 1 tablet (5 mg total) by mouth every other day. 05/31/20   Mosie Lukes, MD  sucralfate (CARAFATE) 1 g tablet Take 1 tablet (1 g total) by mouth 2 (two) times daily as needed. 02/29/20   Mauri Pole, MD  SYNTHROID 100 MCG tablet TAKE 1 TABLET ONCE DAILY BEFORE BREAKFAST. 11/05/20   Saguier, Percell Miller, PA-C  Vitamin D, Cholecalciferol, 50 MCG (2000 UT) CAPS Take 1 capsule by  mouth daily.    [provider]    Allergies    Celestone [betamethasone sodium phosphate], Droperidol, Omni-pac [cefdinir], Statins, and Hydrocodone  Review of Systems   Review of Systems  Constitutional: Positive for fever. Negative for activity change, chills, diaphoresis and fatigue.  HENT: Negative for congestion and sore throat.   Respiratory: Negative for shortness of breath and wheezing.   Cardiovascular: Negative for chest pain and palpitations.  Gastrointestinal: Positive for nausea and vomiting. Negative for abdominal pain, blood in stool, constipation and diarrhea.  Genitourinary: Negative for dysuria, flank pain, frequency and pelvic pain.       Urinary hesitancy (resolved)  Musculoskeletal: Positive for neck pain. Negative for arthralgias, back pain, gait problem, myalgias and neck stiffness.  Skin: Negative for rash and wound.  Allergic/Immunologic: Negative for immunocompromised state.  Neurological: Positive for headaches. Negative for dizziness, seizures, syncope, weakness and numbness.  Psychiatric/Behavioral: Negative for confusion.    Physical Exam Updated Vital Signs BP 112/70   Pulse 84   Temp 98.9 F (37.2 C) (Oral)   Resp 20   Ht 5\' 7"  (1.702 m)   Wt 82 kg   SpO2 93%   BMI 28.31 kg/m   Physical Exam Vitals and nursing note reviewed.  Constitutional:      General: She is not in acute distress.    Appearance: She is not toxic-appearing or diaphoretic.  HENT:     Head: Normocephalic.  Eyes:     Conjunctiva/sclera: Conjunctivae normal.  Neck:     Comments: No meningismus. Cardiovascular:     Rate and Rhythm: Normal rate and regular rhythm.     Heart sounds: No murmur heard. No friction rub. No gallop.   Pulmonary:     Effort: Pulmonary effort is normal. No respiratory distress.     Breath sounds: No stridor. No wheezing, rhonchi or rales.  Chest:     Chest wall: No tenderness.  Abdominal:     General: There is no distension.      Palpations: Abdomen is soft. There is no mass.     Tenderness: There is abdominal tenderness. There is no right  CVA tenderness, left CVA tenderness, guarding or rebound.     Hernia: No hernia is present.     Comments: Tender palpation in the epigastric region without rebound or guarding.  Well-healing surgical scars are noted to the upper and lower abdomen.  Hypoactive bowel sounds in all 4 quadrants.  No CVA tenderness bilaterally.  Negative Murphy sign.  Musculoskeletal:     Cervical back: Neck supple.     Right lower leg: No edema.     Left lower leg: No edema.  Skin:    General: Skin is warm.     Findings: No rash.  Neurological:     General: No focal deficit present.     Mental Status: She is alert.  Psychiatric:        Behavior: Behavior normal.     ED Results / Procedures / Treatments   Labs (all labs ordered are listed, but only abnormal results are displayed) Labs Reviewed  CBC WITH DIFFERENTIAL/PLATELET - Abnormal; Notable for the following components:      Result Value   WBC 14.5 (*)    Neutro Abs 12.6 (*)    Lymphs Abs 0.6 (*)    Abs Immature Granulocytes 0.08 (*)    All other components within normal limits  COMPREHENSIVE METABOLIC PANEL - Abnormal; Notable for the following components:   Glucose, Bld 151 (*)    Calcium 8.7 (*)    AST 53 (*)    ALT 47 (*)    All other components within normal limits  RESP PANEL BY RT-PCR (FLU A&B, COVID) ARPGX2  CULTURE, BLOOD (ROUTINE X 2)  CULTURE, BLOOD (ROUTINE X 2)  LIPASE, BLOOD  LACTIC ACID, PLASMA  URINALYSIS, ROUTINE W REFLEX MICROSCOPIC    EKG None  Radiology CT ABDOMEN PELVIS W CONTRAST  Result Date: 12/24/2020 CLINICAL DATA:  Abdominal pain and fever EXAM: CT ABDOMEN AND PELVIS WITH CONTRAST TECHNIQUE: Multidetector CT imaging of the abdomen and pelvis was performed using the standard protocol following bolus administration of intravenous contrast. CONTRAST:  185mL OMNIPAQUE IOHEXOL 300 MG/ML  SOLN  COMPARISON:  01/30/2008 FINDINGS: Lower chest: Small pericardial cyst or other benign cyst at the right cardiophrenic sulcus. Hepatobiliary: Possible hepatic steatosis.No evidence of biliary obstruction or stone. Pancreas: Unremarkable. Spleen: Unremarkable. Adrenals/Urinary Tract: Negative adrenals. No hydronephrosis or stone. Unremarkable bladder. Stomach/Bowel: No obstruction. No visible bowel inflammation. Appendectomy. Nissen fundoplication. Vascular/Lymphatic: No acute vascular abnormality. No mass or adenopathy. Reproductive:No pathologic findings. Other: No ascites or pneumoperitoneum. Musculoskeletal: No acute abnormalities. Mild scoliosis and L5-S1 anterolisthesis. IMPRESSION: No acute finding or cause for symptoms. Electronically Signed   By: Monte Fantasia M.D.   On: 12/24/2020 05:27   DG Chest Portable 1 View  Result Date: 12/24/2020 CLINICAL DATA:  Fever, cough EXAM: PORTABLE CHEST 1 VIEW COMPARISON:  04/03/2020 FINDINGS: The lungs are symmetrically well expanded and are clear. Stable chronic coarsening of the pulmonary interstitium. No pneumothorax or pleural effusion. Cardiac size within normal limits. Pulmonary vascularity is normal. No acute bone abnormality. Surgical clips are seen at the thoracic inlet. IMPRESSION: No active disease. Electronically Signed   By: Fidela Salisbury MD   On: 12/24/2020 05:39    Procedures Procedures   Medications Ordered in ED Medications  ondansetron (ZOFRAN-ODT) disintegrating tablet 4 mg (4 mg Oral Given 12/23/20 2242)  acetaminophen (TYLENOL) tablet 650 mg (650 mg Oral Given 12/24/20 0505)  metoCLOPramide (REGLAN) injection 10 mg (10 mg Intravenous Given 12/24/20 0506)  sodium chloride 0.9 % bolus 1,000 mL (0 mLs  Intravenous Stopped 12/24/20 0648)  iohexol (OMNIPAQUE) 300 MG/ML solution 100 mL (100 mLs Intravenous Contrast Given 12/24/20 0518)    ED Course  I have reviewed the triage vital signs and the nursing notes.  Pertinent labs & imaging  results that were available during my care of the patient were reviewed by me and considered in my medical decision making (see chart for details).    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  Patient did not realize that she was febrile until she arrived in the emergency department.  Febrile to 100.6 on arrival.  No tachycardia.  Initial oxygen saturation was 91% on room air.  Patient's noted to be coughing at bedside.  She has been struggling with recurrent neck pain and headaches since she had surgery in February 2022.  She has also had intermittent episodes of vomiting and diarrhea since her surgical procedure, the vomiting has been worse tonight.  Labs and imaging of been reviewed and independently interpreted by me.  She has a leukocytosis of 14.  Mild elevation in transaminases.  On exam, she has no any focal right upper quadrant tenderness.  Given recent surgery, will plan for CT imaging of the abdomen and pelvis.  Since she was coughing and had lower normal oxygen saturations along with a fever, will order portable chest x-ray and COVID-19 test.  Urinalysis is also pending and patient is currently completing a course of Macrobid for a UTI and has 2 tablets left in the course.  COVID-19 and influenza test are negative.  CT abdomen pelvis was unremarkable.  Chest x-ray is unremarkable.  The patient was seen and independently evaluated by Dr. Roxanne Mins, attending physician.  He does note right upper quadrant tenderness on her exam.  Since she does have increased transaminases and no evidence of a viral process will order right upper quadrant ultrasound.  Urinalysis is also pending.  On reevaluation, patient  reports significant improvement in her symptoms and is resting comfortably.  She will also need to be fluid challenged.  Patient care transferred to PA Badalamente at the end of my shift to follow-up on right upper quadrant ultrasound, UA, and fluid challenge.  If these are reassuring, patient can likely be discharged home with antiemetics.  Since she did have 1 episode of documented fever, blood cultures were obtained. Patient presentation, ED course, and plan of care discussed with review of all pertinent labs and imaging. Please see his/her note for further details regarding further ED course and disposition.     Final Clinical Impression(s) / ED Diagnoses Final diagnoses:  Elevated transaminase level    Rx / DC Orders ED Discharge Orders    None       Joanne Gavel, PA-C 09/47/09 [id number]    Delora Fuel, MD 66/[birth year]47 [phone]

## 2020-12-24 NOTE — Telephone Encounter (Signed)
Thanks Mallie Mussel for letting me know

## 2020-12-24 NOTE — ED Notes (Signed)
Patient transported to Ultrasound 

## 2020-12-24 NOTE — Telephone Encounter (Signed)
This patient called last evening through the answering service with nausea beginning yesterday that progressed to intractable retching.  She was clearly in distress at the time of the call, and a neighbor was there helping to give the history.  As near as I could tell, she underwent a hiatal hernia repair and fundoplication at a [facility] hospital 5 weeks ago and was well until yesterday.  She called our service for advice, and she saw you about 6 months ago for rectal bleeding.  I recommended they proceed immediately to the emergency department, where she is at this time.  - HD

## 2020-12-25 ENCOUNTER — Other Ambulatory Visit: Payer: Self-pay | Admitting: Family Medicine

## 2020-12-25 ENCOUNTER — Telehealth: Payer: Self-pay | Admitting: Family Medicine

## 2020-12-25 LAB — URINE CULTURE: Culture: <10000 — AB

## 2020-12-25 MED ORDER — SUMATRIPTAN SUCCINATE 50 MG PO TABS: ORAL_TABLET | ORAL | 1 refills | Status: AC

## 2020-12-25 NOTE — Telephone Encounter (Signed)
I sent in refill

## 2020-12-25 NOTE — Telephone Encounter (Signed)
Patient states she been having migraine since her surgery. She was giving imitrex but she left them in [place] at her daughter house. Wondering if you could please giver her some more   [address], [place], [place] [id number]  Phone:  [phone] Fax:  [phone]

## 2020-12-25 NOTE — Telephone Encounter (Signed)
I didn't see this medication in medication list is ok to send some in.

## 2020-12-29 LAB — CULTURE, BLOOD (ROUTINE X 2)
Culture: NO GROWTH
Culture: NO GROWTH
Special Requests: ADEQUATE

## 2020-12-30 ENCOUNTER — Inpatient Hospital Stay: Payer: PRIVATE HEALTH INSURANCE

## 2020-12-30 ENCOUNTER — Inpatient Hospital Stay: Payer: Commercial Managed Care - Pharmacy Benefit Manager

## 2020-12-30 ENCOUNTER — Other Ambulatory Visit: Payer: Self-pay

## 2020-12-30 ENCOUNTER — Encounter: Payer: Self-pay | Admitting: Cardiology

## 2020-12-30 ENCOUNTER — Ambulatory Visit (INDEPENDENT_AMBULATORY_CARE_PROVIDER_SITE_OTHER): Payer: Managed Care, Other (non HMO) | Admitting: Cardiology

## 2020-12-30 VITALS — BP 98/62 | HR 95 | Ht 66.5 in | Wt 172.8 lb

## 2020-12-30 DIAGNOSIS — R5383 Other fatigue: Secondary | ICD-10-CM

## 2020-12-30 DIAGNOSIS — R6889 Other general symptoms and signs: Secondary | ICD-10-CM

## 2020-12-30 NOTE — Progress Notes (Signed)
Cardiology Office Note    Date:  12/30/2020   ID:  Kathleen, Ryan 01/09/1956, MRN [id number]  PCP:  Mosie Lukes, MD  Cardiologist:  Fransico Him, MD   Chief Complaint  Patient presents with  . New Patient (Initial Visit)    Fatigue and shakiness    History of Present Illness:  [name] is a 65 y.o. female who is being seen today for the evaluation of fatigue at the request of Mosie Lukes, MD.  This is a 65yo female with a hx of depression, esophagitis, GERD, thyroid CA and HLD who was referred by her PCP for evaluation of fatigue.  She tells me that she had a Nissen fundoplication at [facility] this past February and she feels that she has not recouporated as quickly as she thought she would.  She is planning on moving to [place] and has sold her house and is under stress.  She has been busy packing.    She says that the way she was positioned on the OR table, her head was back and when she awakened she had a migraine HA and neck soreness. Her BP was elevated in the hospital that was felt was related to her HAs.  She thinks that she now have nerve problems with her neck.    On 12/24/2020 she went to the ER with nausea and vomiting which eventually resolved with medications.  She has also had some other spells where she feels like she has a drop in her BP or her BS and felt like she was crashing and would be extremely fatigued and shaky and get very hot and have to lay down.  She saw her endocrinologist and was told to stop drinking apple juice due to how much sugar it has.  Since cutting it out she has done better.  She denies any chest pain or pressure, DOE, SOB, dizziness, palpitations or syncope.   She had an EKG that was done at PCP office that was read out by the machine as anterior infarct but was normal. She says that since she cut out apple juice her sx shave resolved.  She did take her BP during the episodes and it was normal.   Past Medical History:  Diagnosis  Date  . Acute upper respiratory infection 09/13/2016  . Allergic rhinitis   . Allergy   . Anxiety state 08/24/2016  . Barrett's esophagus without dysplasia    pt says was suspected never confirmed 9 or 10 years ago  . Cancer [facility])    thyroid cancer  . Depression   . Esophagitis   . Fundic gland polyps of stomach, benign   . GERD (gastroesophageal reflux disease)   . Hemorrhoids   . History of hiatal hernia   . History of ovarian cyst   . History of thyroid cancer    05-01-2011  s/p  total thyroidectomy for multinodular goiter and papillary carcinoma  . Hyperglycemia 03/01/2016  . Hyperlipidemia    borderline  . Hypothyroidism, postsurgical   . Internal hemorrhoids   . Lumbar spondylosis    L4 -- S1  . Osteoporosis    osteopenia  . Overweight 08/14/2009   Qualifier: Diagnosis of  By: Niel Hummer MD, Lorinda Creed   . Palpitations 03/01/2016  . PONV (postoperative nausea and vomiting)    nausea with pain meds  . TMJ (temporomandibular joint disorder)     Past Surgical History:  Procedure Laterality Date  . APPENDECTOMY  01-30-2008  .  CARPAL TUNNEL RELEASE Right 08/30/2014   Procedure: RIGHT CARPAL TUNNEL RELEASE;  Surgeon: Linna Hoff, MD;  Location: [facility];  Service: Orthopedics;  Laterality: Right;  . COLONOSCOPY  08-08-2007  . HEMORRHOID SURGERY    . LAPAROSCOPIC RIGHT OOPHORECTOMY  1996  . LAPAROSCOPIC TUBAL LIGATION  07-21-2002   W/   Dilatation and evacuation  . LEFT SHOULDER CLOSED MANIPULATION/  ARTHROSCOPIC DEBRIDEMENT LABRUM/  SAD/ RELEASE  CA LIGAMENT/ BURSECTOMY  10-12-2011  . PARTIAL KNEE ARTHROPLASTY Right 12/09/2015   Procedure: RIGHT KNEE MEDIAL UNICOMPARTMENTAL ARTHROPLASTY ;  Surgeon: Gaynelle Arabian, MD;  Location: [facility];  Service: Orthopedics;  Laterality: Right;  . TOTAL THYROIDECTOMY  05-01-2011  . TRANSOBTURATOR SLING  01-07-2005   W/ CYSTOCELE REPAIR  . UPPER GASTROINTESTINAL ENDOSCOPY  last one 06-29-2014    Current  Medications: Current Meds  Medication Sig  . AMBULATORY NON FORMULARY MEDICATION Medication Name: Nitroglycerin compound 0.125% apply pea size amount into rectum twice daily x 4 to 6 weeks  . Calcium Citrate-Vitamin D (CITRACAL + D PO) Take 2 tablets by mouth daily. 1200mg   . cyclobenzaprine (FLEXERIL) 5 MG tablet Take 1 tablet (5 mg total) by mouth at bedtime.  Marland Kitchen escitalopram (LEXAPRO) 10 MG tablet Take 1 tablet by mouth daily.  . ondansetron (ZOFRAN ODT) 4 MG disintegrating tablet Take 1 tablet (4 mg total) by mouth every 8 (eight) hours as needed for nausea or vomiting.  . Probiotic Product (PROBIOTIC PO) Take 1 capsule by mouth as needed. Lucky Probiotic PRN  . rosuvastatin (CRESTOR) 5 MG tablet Take 1 tablet (5 mg total) by mouth every other day.  . sucralfate (CARAFATE) 1 g tablet Take 1 tablet (1 g total) by mouth 2 (two) times daily as needed.  . SUMAtriptan (IMITREX) 50 MG tablet 1 tab po prn migraine, may repeat 1 tab dose 2 hours later if headache persists.  . SYNTHROID 100 MCG tablet TAKE 1 TABLET ONCE DAILY BEFORE BREAKFAST.  Marland Kitchen Vitamin D, Cholecalciferol, 50 MCG (2000 UT) CAPS Take 1 capsule by mouth daily.    Allergies:   Codeine, Cefdinir, Celestone [betamethasone sodium phosphate], Droperidol, Statins, Tramadol, and Hydrocodone   Social History   Socioeconomic History  . Marital status: Married    Spouse name: Not on file  . Number of children: 2  . Years of education: Not on file  . Highest education level: Not on file  Occupational History  . Occupation: realtor  Tobacco Use  . Smoking status: Former Smoker    Years: 5.00    Types: Cigarettes    Start date: 09/28/1984    Quit date: 08/28/1985    Years since quitting: 35.3  . Smokeless tobacco: Never Used  Vaping Use  . Vaping Use: Never used  Substance and Sexual Activity  . Alcohol use: Yes    Comment: rarely  . Drug use: No  . Sexual activity: Not on file  Other Topics Concern  . Not on file  Social  History Narrative  . Not on file   Social Determinants of Health   Financial Resource Strain: Not on file  Food Insecurity: Not on file  Transportation Needs: Not on file  Physical Activity: Not on file  Stress: Not on file  Social Connections: Not on file     Family History:  The patient's family history includes Allergies in her brother; Alzheimer's disease in her mother; Atrial fibrillation in her mother; Cancer in her father; Diabetes in her father; Heart disease in  her daughter and father; Hyperlipidemia in her brother and mother; Hyperparathyroidism in her mother; Hypertension in her brother and mother; Stroke in her father; Thyroid disease in her maternal grandmother and mother.   ROS:   Please see the history of present illness.    ROS All other systems reviewed and are negative.  No flowsheet data found.     PHYSICAL EXAM:   VS:  BP 98/62   Pulse 95   Ht 5' 6.5" (1.689 m)   Wt 172 lb 12.8 oz (78.4 kg)   SpO2 96%   BMI 27.47 kg/m     GEN: Well nourished, well developed, in no acute distress  HEENT: normal  Neck: no JVD, carotid bruits, or masses Cardiac: RRR; no murmurs, rubs, or gallops,no edema.  Intact distal pulses bilaterally.  Respiratory:  clear to auscultation bilaterally, normal work of breathing GI: soft, nontender, nondistended, + BS MS: no deformity or atrophy  Skin: warm and dry, no rash Neuro:  Alert and Oriented x 3, Strength and sensation are intact Psych: euthymic mood, full affect  Wt Readings from Last 3 Encounters:  12/30/20 172 lb 12.8 oz (78.4 kg)  12/23/20 180 lb 12.4 oz (82 kg)  12/10/20 174 lb (78.9 kg)      Studies/Labs Reviewed:   EKG:  EKG is not ordered today but was normal on 12/05/2020  Recent Labs: 12/05/2020: Magnesium 2.1 12/10/2020: TSH 1.12 12/23/2020: ALT 47; BUN 14; Creatinine, Ser 0.69; Hemoglobin 13.9; Platelets 221; Potassium 3.5; Sodium 136   Lipid Panel    Component Value Date/Time   CHOL 193 10/07/2020 0908    TRIG 92.0 10/07/2020 0908   HDL 58.00 10/07/2020 0908   CHOLHDL 3 10/07/2020 0908   VLDL 18.4 10/07/2020 0908   LDLCALC 117 (H) 10/07/2020 0908   LDLDIRECT 212.6 08/10/2012 1012     Additional studies/ records that were reviewed today include:  OV notes from     ASSESSMENT:    1. Other fatigue      PLAN:  In order of problems listed above:  1. Fatigue -she just had a major abdominal surgery a month ago and I suspect that she has residual fatigue from that -she has had some episodes where she would suddenly get very hot, start shaking and get very fatigued and have to sit down and eat something.  She felt like her BS was crashing -she talked with her endocrinologist who felt it was related to drinking apple juice and too many carbs that were recommended after she had her surgery.  Once she cut out carbs her sx resolved. -Her orthostatic BPs are normal today -I do not think her sx are cardiac related -I will get a coronary Ca score to assess future cardiac risk    Medication Adjustments/Labs and Tests Ordered: Current medicines are reviewed at length with the patient today.  Concerns regarding medicines are outlined above.  Medication changes, Labs and Tests ordered today are listed in the Patient Instructions below.  Patient Instructions  Medication Instructions:  Your physician recommends that you continue on your current medications as directed. Please refer to the Current Medication list given to you today.  *If you need a refill on your cardiac medications before your next appointment, please call your pharmacy*   Testing/Procedures: Your provider recommends that you have a calcium score CT scan.   Follow-Up: At [facility], you and your health needs are our priority.  As part of our continuing mission to provide you with exceptional  heart care, we have created designated Provider Care Teams.  These Care Teams include your primary Cardiologist (physician) and  Advanced Practice Providers (APPs -  Physician Assistants and Nurse Practitioners) who all work together to provide you with the care you need, when you need it.  Follow up with Dr. Radford Pax as needed based on results of testing.      Signed, Fransico Him, MD  12/30/2020 2:29 PM    [facility] [place], [place], [place]  [address] Phone: [phone]; Fax: [phone]

## 2020-12-30 NOTE — Patient Instructions (Signed)
Medication Instructions:  Your physician recommends that you continue on your current medications as directed. Please refer to the Current Medication list given to you today.  *If you need a refill on your cardiac medications before your next appointment, please call your pharmacy*   Testing/Procedures: Your provider recommends that you have a calcium score CT scan.   Follow-Up: At [facility], you and your health needs are our priority.  As part of our continuing mission to provide you with exceptional heart care, we have created designated Provider Care Teams.  These Care Teams include your primary Cardiologist (physician) and Advanced Practice Providers (APPs -  Physician Assistants and Nurse Practitioners) who all work together to provide you with the care you need, when you need it.  Follow up with Dr. Radford Pax as needed based on results of testing.

## 2021-01-01 ENCOUNTER — Telehealth: Payer: Self-pay | Admitting: Internal Medicine

## 2021-01-01 DIAGNOSIS — E890 Postprocedural hypothyroidism: Secondary | ICD-10-CM

## 2021-01-01 NOTE — Telephone Encounter (Signed)
Patient requests a new RX with 12 refills for the following:  MEDICATION:  SYNTHROID 100 MCG tablet  PHARMACY:    [place], [place] Phone:  [phone]  Fax:  [phone]      HAS THE PATIENT CONTACTED [place]?  Yes   IS THIS A 90 DAY SUPPLY : If 90 day supply Patient requests new with 4 refills for the above listed medication  IS PATIENT OUT OF MEDICATION:  No  IF NOT; HOW MUCH IS LEFT: Approx. 10 tablets  LAST APPOINTMENT DATE: @3 /15/2022  NEXT APPOINTMENT DATE:@ Patient states she will call back to schedule appointment  DO [place] TO LEAVE A DETAILED MESSAGE?: Yes  OTHER COMMENTS:    **Let patient know to contact pharmacy at the end of the day to make sure medication is ready. **  ** Please notify patient to allow 48-72 hours to process**  **Encourage patient to contact the pharmacy for refills or they can request refills through [facility]**

## 2021-01-02 ENCOUNTER — Telehealth: Payer: Self-pay | Admitting: *Deleted

## 2021-01-02 MED ORDER — SYNTHROID 100 MCG PO TABS: ORAL_TABLET | ORAL | 6 refills | Status: DC

## 2021-01-02 NOTE — Addendum Note (Signed)
Addended by: Lauralyn Primes on: 01/02/2021 03:29 PM   Modules accepted: Orders

## 2021-01-02 NOTE — Telephone Encounter (Signed)
Rx sent to preferred pharmacy.

## 2021-01-02 NOTE — Telephone Encounter (Signed)
Prior Auth started on Synthroid via cover my meds Key: [id number]

## 2021-01-07 ENCOUNTER — Telehealth: Payer: Self-pay | Admitting: Internal Medicine

## 2021-01-07 DIAGNOSIS — E890 Postprocedural hypothyroidism: Secondary | ICD-10-CM

## 2021-01-07 NOTE — Telephone Encounter (Signed)
Effective march, pt's insurance won't pay for her synthroid anymore unless Dr sends an PA exception form for name brand. Pt states they have faxed [place] over the forms the other day and just faxed them again not too long ago. Pt just wants to know what does Dr.G recommend because she is moving to [place] on Monday and wants this resolved before then so she can have her medication?  Pt prefers to stay with name brand Synthroid but will switch to generic or another if that's what Dr.G recommends.   Ph# [phone]  [place], [place] Phone:  [phone]  Fax:  [phone]

## 2021-01-08 NOTE — Telephone Encounter (Signed)
Sent Rx to pharmacy for generic or complete PA?

## 2021-01-08 NOTE — Telephone Encounter (Signed)
Letter looks good, Thanks

## 2021-01-08 NOTE — Telephone Encounter (Signed)
Can you take a look at the letter for patients Synthroid appeal letter in her chart.  Please let me know if that looks ok?  If so I will get it faxed out today.

## 2021-01-08 NOTE — Telephone Encounter (Signed)
Letter faxed.

## 2021-01-09 ENCOUNTER — Telehealth: Payer: Self-pay | Admitting: Family Medicine

## 2021-01-09 ENCOUNTER — Ambulatory Visit (INDEPENDENT_AMBULATORY_CARE_PROVIDER_SITE_OTHER)
Admission: RE | Admit: 2021-01-09 | Discharge: 2021-01-09 | Disposition: A | Discharge status: Home / Self Care | Payer: Self-pay | Source: Ambulatory Visit | Attending: Cardiology | Admitting: Cardiology

## 2021-01-09 ENCOUNTER — Other Ambulatory Visit: Payer: Self-pay

## 2021-01-09 DIAGNOSIS — R5383 Other fatigue: Secondary | ICD-10-CM

## 2021-01-09 MED ORDER — SYNTHROID 100 MCG PO TABS: ORAL_TABLET | ORAL | 3 refills | Status: AC

## 2021-01-09 NOTE — Telephone Encounter (Signed)
Called and spoke with pt. PA completed and approved via PCP office. Rx sent to preferred pharmacy for a year supply. Pt leaves for [place] and requested enough to last while she found a new provider.

## 2021-01-09 NOTE — Telephone Encounter (Signed)
[employer] called to inform that the  PA for patient's Synthroid was approved. She stated that [facility] sent in request. 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

## 2021-01-09 NOTE — Telephone Encounter (Signed)
Pt.notified

## 2021-01-09 NOTE — Telephone Encounter (Signed)
T, I am not opposed to her going to generic levothyroxine, however, if she prefers to stay on the brand-name, can you please give her information about the "Synthroid delivers" program?  The price would be $25 per month.  This is probably our best bet.

## 2021-01-14 ENCOUNTER — Ambulatory Visit: Payer: PRIVATE HEALTH INSURANCE

## 2021-01-14 ENCOUNTER — Other Ambulatory Visit: Payer: Self-pay

## 2021-01-14 ENCOUNTER — Telehealth: Payer: Managed Care, Other (non HMO) | Admitting: Family Medicine

## 2021-01-15 ENCOUNTER — Other Ambulatory Visit: Payer: Self-pay

## 2021-01-15 ENCOUNTER — Telehealth: Payer: Self-pay

## 2021-01-15 DIAGNOSIS — E782 Mixed hyperlipidemia: Secondary | ICD-10-CM

## 2021-01-15 MED ORDER — ROSUVASTATIN CALCIUM 5 MG PO TABS: 5.0000 mg | ORAL_TABLET | Freq: Every day | ORAL | 3 refills | Status: AC

## 2021-01-15 NOTE — Telephone Encounter (Signed)
The patient has been notified of the result and verbalized understanding.  All questions (if any) were answered. Antonieta Iba, RN 01/15/2021 11:42 AM

## 2021-01-15 NOTE — Telephone Encounter (Signed)
-----   Message from Sueanne Margarita, MD sent at 01/11/2021  6:13 PM EDT ----- Coronary Ca score low at 3 but high for her age.  LDL goal < 70 and hers was 117 in Jan 2022.  Increase Crestor to 5mg  daily and repeat FLP and ALT in 6 weeks.

## 2021-01-27 DIAGNOSIS — Z09 Encounter for follow-up examination after completed treatment for conditions other than malignant neoplasm: Secondary | ICD-10-CM

## 2021-01-27 DIAGNOSIS — K219 Gastro-esophageal reflux disease without esophagitis: Secondary | ICD-10-CM

## 2021-01-27 NOTE — Progress Notes
PATIENT:Alice Saunders  ZOX:[id number]  DOB: June 23, 1956  DATE OF SERVICE: 01/28/21  GENERAL SURGERY POST-OP PROGRESS NOTE    HPI:     Alice Saunders is a 65 y.o. female with a history of GERD (at least 25 years)???and a???hiatal hernia, now s/p laparoscopic repair of hiatal hernia and Toupe fundoplication on 11/15/20. Last seen as a telemedicine follow up on 12/10/20.    Subjective     Patient is ***                                                       Objective     Home medications:     Current Outpatient Medications   Medication Sig   ??? cholecalciferol 10 mcg (400 units) tablet Take 5,000 mcg by mouth daily.   ??? escitalopram 10 mg tablet Take 10 mg by mouth daily.   ??? esomeprazole 40 mg capsule Take 40 mg by mouth every morning before breakfast.   ??? levothyroxine (SYNTHROID) 100 mcg tablet Take 100 mcg by mouth daily.   ??? oxyCODONE 5 mg tablet Take one-half tablet (2.5 mg total) by mouth every six (6) hours as needed for Moderate Pain (Pain Scale 4-6) or Severe Pain (Pain Scale 7-10). Max Daily Amount: 10 mg   ??? pantoprazole 40 mg DR tablet Take 1 tablet (40 mg total) by mouth daily.   ??? prochlorperazine 10 mg tablet Take 1 tablet (10 mg total) by mouth every six (6) hours as needed for Nausea or Vomiting.   ??? rosuvastatin 5 mg tablet Take 5 mg by mouth at bedtime.     No current facility-administered medications for this visit.         Vital Signs: There were no vitals taken for this visit.     Physical Exam:  General: No acute distress.  Neuro: Awake, alert and oriented. Moves all extremities.  Resp: Unlabored breathing. Symmetrical expansion.  Abdomen: Soft, non distended, no guarding, no rebound tenderness, palpable masses   INCISIONS:  Well approximated  w/o any erythema or drainage.   Extremities: WWP, no edema       Assessment and Plan:   Alice Saunders is a 65 y.o. female with a history of GERD (at least 25 years)???and a???hiatal hernia, now s/p laparoscopic repair of hiatal hernia and Toupe fundoplication on 11/15/20.       The patient was examined and discussed with the attending physician, Dr. Kennith Center, who agrees with assessment and plan listed above.  Author:    Beryle Beams, NP  General Surgery  [phone]

## 2021-01-28 ENCOUNTER — Non-Acute Institutional Stay: Payer: PRIVATE HEALTH INSURANCE

## 2021-01-29 ENCOUNTER — Ambulatory Visit: Payer: PRIVATE HEALTH INSURANCE

## 2021-01-29 DIAGNOSIS — Z0000 Encounter for general adult medical examination without abnormal findings: Secondary | ICD-10-CM

## 2021-02-13 ENCOUNTER — Ambulatory Visit: Payer: Managed Care, Other (non HMO) | Admitting: Internal Medicine

## 2021-02-17 ENCOUNTER — Inpatient Hospital Stay: Payer: PRIVATE HEALTH INSURANCE

## 2021-02-17 ENCOUNTER — Ambulatory Visit: Payer: Commercial Managed Care - Pharmacy Benefit Manager

## 2021-02-17 ENCOUNTER — Ambulatory Visit: Payer: PRIVATE HEALTH INSURANCE

## 2021-02-17 DIAGNOSIS — S20212A Contusion of left front wall of thorax, initial encounter: Secondary | ICD-10-CM

## 2021-02-18 MED ORDER — IBUPROFEN 600 MG PO TABS
600 mg | ORAL_TABLET | Freq: Four times a day (QID) | ORAL | 0 refills | Status: AC | PRN
Start: 2021-02-18 — End: ?

## 2021-02-18 MED ORDER — HYDROCODONE-ACETAMINOPHEN 5-325 MG PO TABS
1 | ORAL_TABLET | Freq: Four times a day (QID) | ORAL | 0 refills | Status: AC | PRN
Start: 2021-02-18 — End: ?

## 2021-02-18 NOTE — ED Provider Notes
[facility]  Emergency Department Service Report    Alice Saunders 65 y.o. female , presents with Rib Injury      Triage   Arrived on 02/17/2021 at 3:10 PM   Arrived by Walk-in [14]    ED Triage Vitals [02/17/21 1525]   Temp Temp Source BP Heart Rate Resp SpO2 O2 Device Pain Score Weight   36.6 ?C (97.9 ?F) Oral 132/71 80 18 98 % None (Room air) Seven --       Pre hospital care:       Allergies   Allergen Reactions   ? Codeine Hives     ? Not sure if hydrocodone, oxycodone, codeine?    ? Betamethasone Other (See Comments)     ''Feels hot and looks sunburned''     ? Cefdinir Diarrhea     Omnicef   ? Droperidol      Unable to speak by pt. ''spaced out''   ? Tramadol      Ring in ears, migraines       History   Patient is a 66 y.o. female with hx of 3 months p/op hiatal hernia repair and partial fundolication who presents to the ED with complaint of right sided rib injury x 6 days ago. Sx are constant, moderate, with no alleviating factors. Pt reports hitting her rib on the corner of her bed when trying to grab her dog that was falling down. Pt reports that she tried to contact her doctor but she did not get any response, so she came to the ED. Pt reports having some pain along the side of her right rib and back of the rib. Pt is concerned about a possible injury to her spleen.           The history is provided by the patient. No language interpreter was used.   Chest Pain  The chest pain began 5 - 7 days ago. Chest pain occurs constantly. The chest pain is unchanged. The pain is associated with stress. The severity of the chest pain is moderate. The quality of the pain is described as sharp. The pain does not radiate. Chest pain is worsened by exertion. She tried nothing for the symptoms. Risk factors include being elderly.              Past Medical History:   Diagnosis Date   ? Barrett's esophagus    ? Depression    ? Esophagitis    ? Gastric polyps    ? GERD (gastroesophageal reflux disease) ??? Hiatal hernia    ??? HLD (hyperlipidemia)    ??? Hypothyroidism    ??? Osteoporosis    ??? Papillary thyroid carcinoma (HCC/RAF)    ??? TMJ (dislocation of temporomandibular joint)         Past Surgical History:   Procedure Laterality Date   ??? THYROIDECTOMY          Past Family History   Family history reviewed by me and there is no pertinent past family history related to the patient's current case and/or care.             Past Social History   she reports that she has never smoked. She has never used smokeless tobacco. She reports previous alcohol use. She reports previously being sexually active. She reports that she does not use drugs.     Review of Systems   Cardiovascular: Positive for chest pain.   Musculoskeletal: Positive for back pain (mild,  towards the left side).   All other systems reviewed and are negative.      Physical Exam   Physical Exam  Vitals and nursing note reviewed.   Constitutional:       Appearance: Normal appearance.   HENT:      Head: Normocephalic.   Eyes:      Extraocular Movements: Extraocular movements intact.      Conjunctiva/sclera: Conjunctivae normal.   Cardiovascular:      Rate and Rhythm: Normal rate and regular rhythm.   Pulmonary:      Effort: Pulmonary effort is normal.      Breath sounds: Normal breath sounds.   Chest:   Breasts:      Left: Tenderness (lower, anterior, and posterior rib cage) present.       Abdominal:      Palpations: Abdomen is soft.      Tenderness: There is no abdominal tenderness.   Musculoskeletal:         General: Normal range of motion.   Skin:     General: Skin is warm.   Neurological:      General: No focal deficit present.      Mental Status: She is alert.   Psychiatric:         Mood and Affect: Mood normal.         Behavior: Behavior normal.         ED Course          Laboratory Results   Labs Reviewed - No data to display    Imaging Results     XR rib series left w pa chest   Final Result by Albin Fischer, MD (05/23 1650)   IMPRESSION:        No osteopenia, no apparent acute rib fracture. No acute intrathoracic injury.            Signed by: Albin Fischer   02/17/2021 4:50 PM          Administered Medications     Medication Administration from 02/17/2021 1510 to 02/17/2021 2207     None          Procedures   Procedural Sedation  Procedures    MDM    Patient progress: improved    65 year old female presenting with rib pain status post injury.  No evidence of grossly displaced rib fracture or pneumothorax.  Supportive care, pain control and close follow-up with PCP.    I have reviewed the patient's vital signs and nursing notes and any labs or imaging studies that were performed in the ER. I had a detailed discussion with the patient regarding the historical points, exam findings and any diagnostic results supporting the discharge diagnosis. I also discussed the need for outpatient follow up and the need to return to the ED if symptoms worsen or if there are any questions or concerns that arise at home.  The patient is well appearing, tolerating PO's and will follow up with PMD.    Consults: N/A    Clinical Impression     1. Rib contusion, left, initial encounter        Prescriptions     Discharge Medication List as of 02/17/2021  5:18 PM      START taking these medications    Details   HYDROcodone-acetaminophen 5-325 mg tablet Take 1 tablet by mouth every six (6) hours as needed for Moderate Pain (Pain Scale 4-6). Max Daily Amount: 4 tablets, Starting Mon 02/17/2021, Print  ibuprofen 600 mg tablet Take 1 tablet (600 mg total) by mouth every six (6) hours as needed., Starting Mon 02/17/2021, Print             Disposition and Follow-up   Disposition: Discharge [1]    No future appointments.    Follow up with:  Bradd Canary., MD  [address]  [address]  [place] [address]  [phone]    In 1 week  for further evaluation and treatment      Return precautions are specified on After Visit Summary.        The documentation on this chart was performed by Louis Meckel, scribed for Tera Mater., MD    02/17/2021 5:12 PM     All scribe entries and documentation made by the scribe were entered at my direction.  I have reviewed this medical record and agree to the accuracy and completeness of the content entered by the scribe.  The documentation recorded by the scribe accurately reflects the service I personally performed and the decisions made by me.    Tera Mater., MD 10:08 PM 02/17/2021             Tera Mater., MD  02/17/21 2208

## 2021-03-17 ENCOUNTER — Ambulatory Visit: Payer: PRIVATE HEALTH INSURANCE

## 2021-04-03 ENCOUNTER — Telehealth: Payer: Self-pay | Admitting: Pharmacy Technician

## 2021-04-03 NOTE — Telephone Encounter (Signed)
[facility] Patient Advocate Encounter  Prior Authorization for [place] has been approved.    Effective dates:  through 09/27/2098      [facility] will continue to follow.   Venida Jarvis. Nadara Mustard, CPhT Patient Advocate [facility] Phone: [phone] Fax:  [phone]

## 2021-04-21 ENCOUNTER — Encounter: Payer: Managed Care, Other (non HMO) | Admitting: Family Medicine

## 2021-05-07 ENCOUNTER — Telehealth: Payer: Self-pay | Admitting: Internal Medicine

## 2021-05-07 NOTE — Telephone Encounter (Signed)
Pt is calling in stating that she has moved to [facility] and is wanting her medical records sent to [facility] [place], [place] [place] [address]. Phone Number [phone] FAX number [phone] If any question call pt back

## 2021-12-11 ENCOUNTER — Telehealth: Payer: Self-pay

## 2021-12-11 NOTE — Telephone Encounter (Signed)
Pt called requesting clinical notes be sent to her new provider's office.  ?Fax sent to office at [phone] requesting a signed release to allow [place] to send complete chart to new provider. ?

## 2023-02-05 IMAGING — CT CT CARDIAC CORONARY ARTERY CALCIUM SCORE
3 series · 14 of 20 positions shown, 15 images · non-contrast
Comparison: None.
COMPARISON: None.

Addendum:
EXAM:
OVER-READ INTERPRETATION  CT CHEST

The following report is an over-read performed by radiologist Dr.
Rudi Jumper [REDACTED] on 01/09/2021. This
over-read does not include interpretation of cardiac or coronary
anatomy or pathology. The coronary calcium score/coronary CTA
interpretation by the cardiologist is attached.
CLINICAL DATA: Cardiovascular Disease Risk stratification
Coronary Calcium Score
TECHNIQUE: A gated, non-contrast computed tomography scan of the heart was
performed using 3mm slice thickness. Axial images were analyzed on a
dedicated workstation. Calcium scoring of the coronary arteries was
performed using the Agatston method.

[Series 2: casc 3.0 bv41 2 bestdiast 72 % · axial · 0.40mm/px · z∈[-208,-148]mm · 4 of 34 slices shown, 5 images]
[im 7/34  vessel]
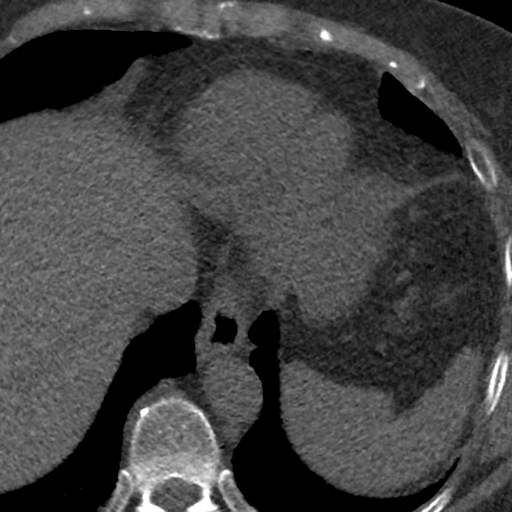
[im 7/34  lung]
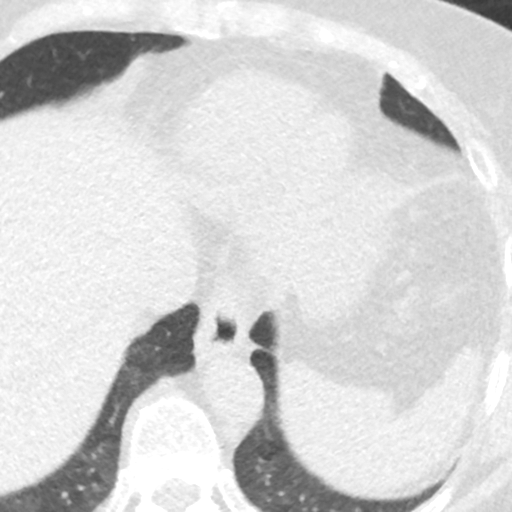
[im 14/34  vessel]
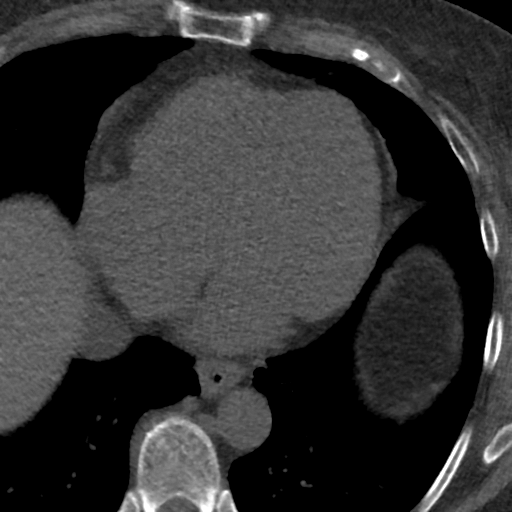
[im 20/34  vessel]
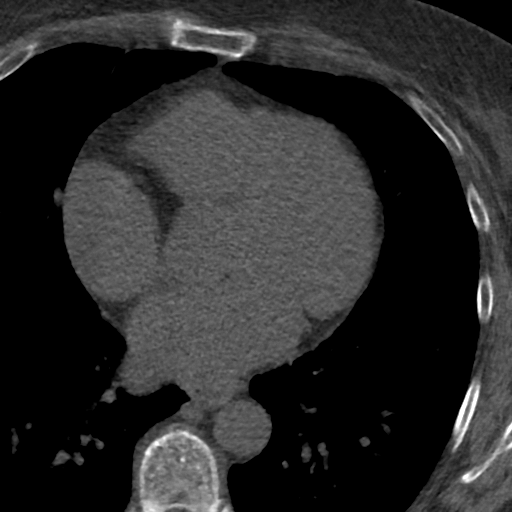
[im 27/34  vessel]
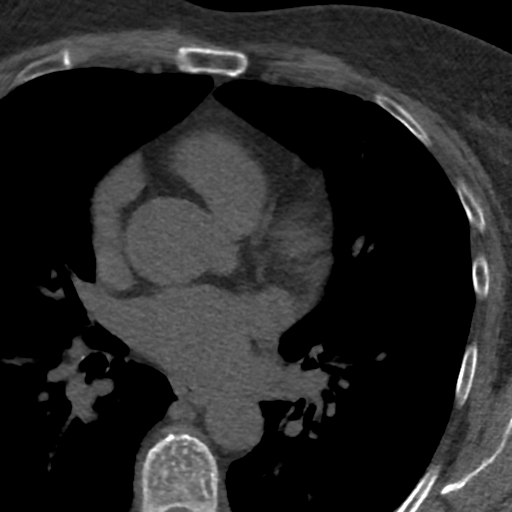

[Series 3: lung 72 % · axial · 0.68mm/px · z∈[-210,-144]mm · 5 of 34 slices shown]
[im 6/34  lung]
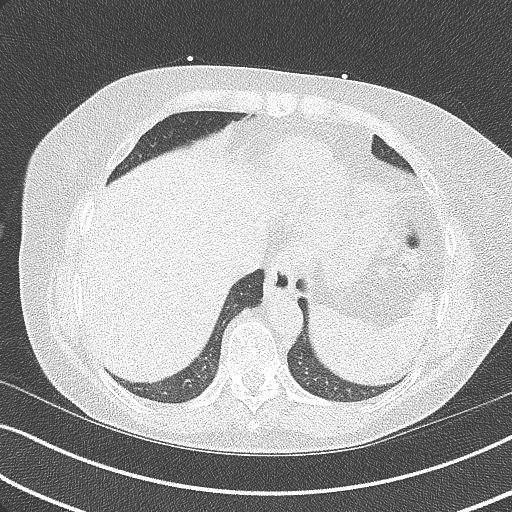
[im 12/34  lung]
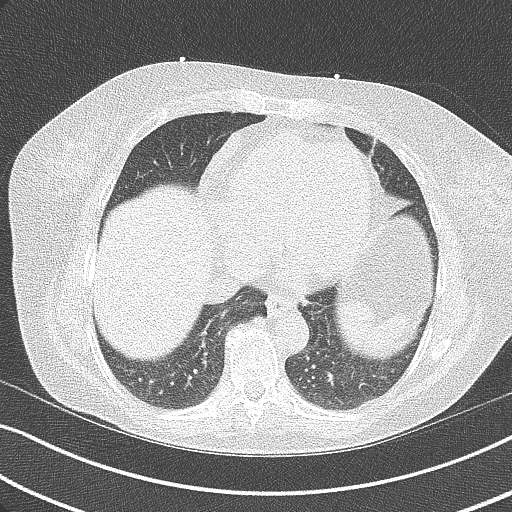
[im 17/34  lung]
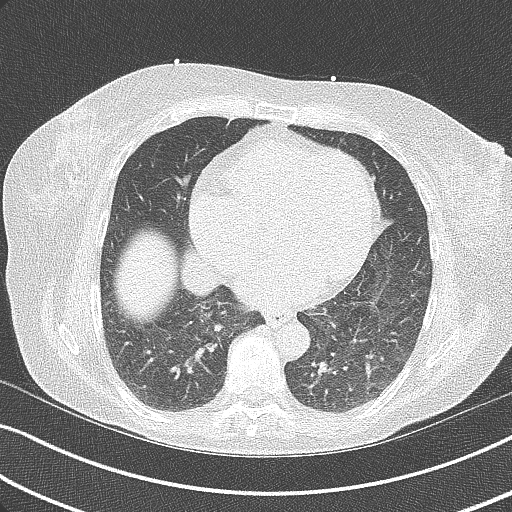
[im 23/34  lung]
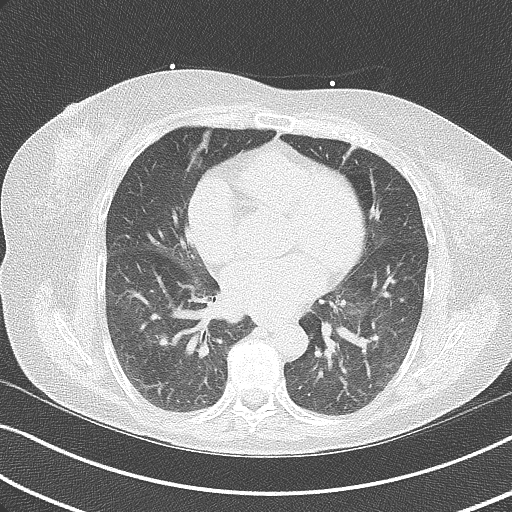
[im 28/34  lung]
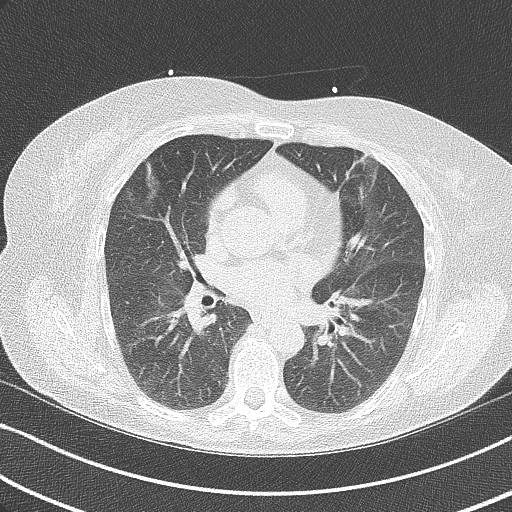

[Series 4: lung st 72 % · axial · 0.68mm/px · z∈[-210,-144]mm · 5 of 34 slices shown]
[im 6/34  lung]
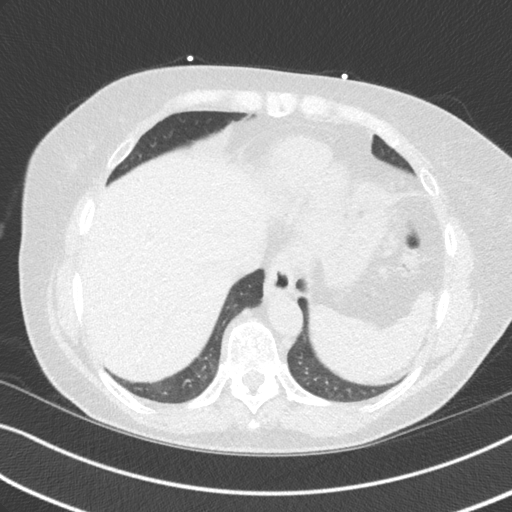
[im 12/34  lung]
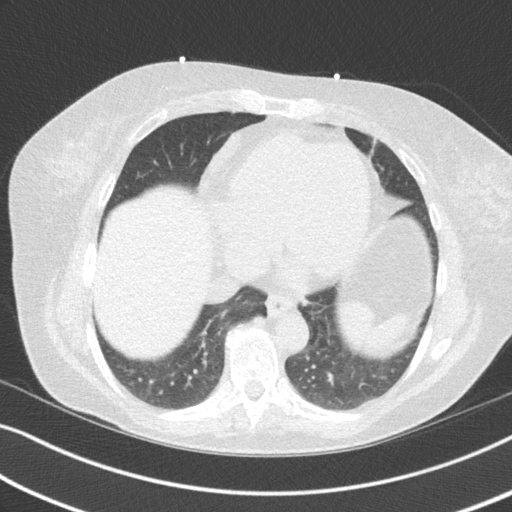
[im 17/34  lung]
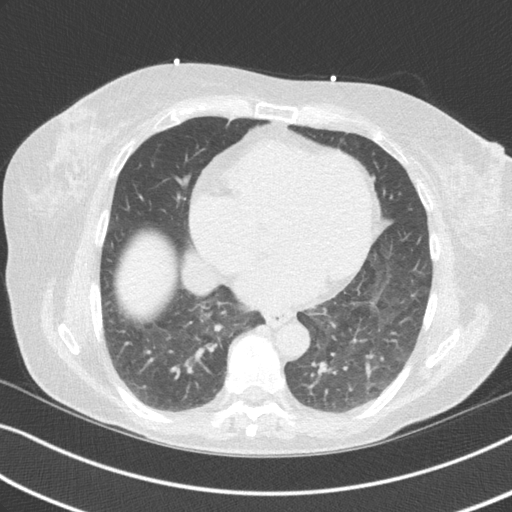
[im 23/34  lung]
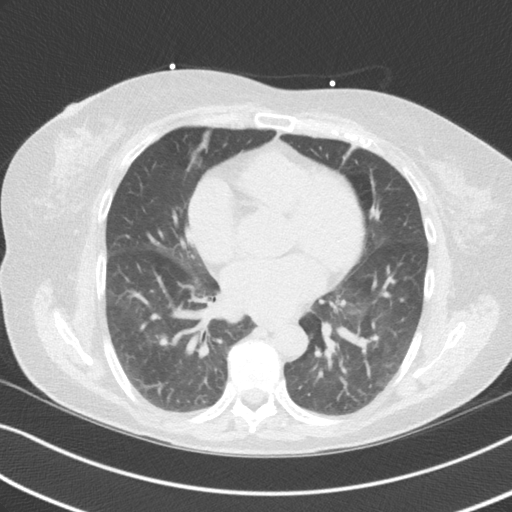
[im 28/34  lung]
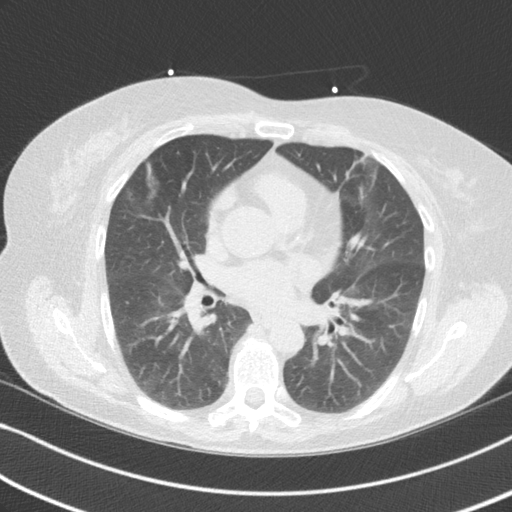

[14 of 20 positions shown; findings below may reference images not displayed]

FINDINGS: Atherosclerotic calcifications in the thoracic aorta. Within the
visualized portions of the thorax there are no suspicious appearing
pulmonary nodules or masses, there is no acute consolidative
airspace disease, no pleural effusions, no pneumothorax and no
lymphadenopathy. Visualized portions of the upper abdomen are
unremarkable. There are no aggressive appearing lytic or blastic
lesions noted in the visualized portions of the skeleton.
IMPRESSION: 1.  Aortic Atherosclerosis (1Y529-FW6.6).
FINDINGS: Coronary arteries: Normal origins.

Coronary Calcium Score:

Left main: 0

Left anterior descending artery:

Left circumflex artery: 0

Right coronary artery: 0

Total:

Percentile: 59th

Pericardium: Normal.

Ascending Aorta: Normal caliber.

Non-cardiac: See separate report from [REDACTED]. Aortic
atherosclerosis.
IMPRESSION: 1. Coronary calcium score of 3.14. Small area of calcium in the
proximal LAD. This was 59th percentile for age-, race-, and
sex-matched controls.
2. Aortic atherosclerosis.



If CAC=0, it is reasonable to withhold statin therapy and reassess
in 5 to 10 years, as long as higher risk conditions are absent
(diabetes mellitus, family history of premature CHD in first degree
relatives (males <55 years; females <65 years), cigarette smoking,
or LDL >=190 mg/dL).

If CAC is 1 to 99, it is reasonable to initiate statin therapy for
patients >=55 years of age.

If CAC is >=100 or >=75th percentile, it is reasonable to initiate
statin therapy at any age.

Cardiology referral should be considered for patients with CAC
scores >=400 or >=75th percentile.

*3518 AHA/ACC/AACVPR/AAPA/ABC/ROUSTAM/KURAVELA/ISLAINE/Don Lolito/RANKIN/BLONDINACKA/CHAN
Guideline on the Management of Blood Cholesterol: A Report of the
American College of Cardiology/American Heart Association Task Force
on Clinical Practice Guidelines. J Am Coll Cardiol.
5486;73(24):2384-2176.

*** End of Addendum ***
EXAM:
OVER-READ INTERPRETATION  CT CHEST

The following report is an over-read performed by radiologist Dr.
Rudi Jumper [REDACTED] on 01/09/2021. This
over-read does not include interpretation of cardiac or coronary
anatomy or pathology. The coronary calcium score/coronary CTA
interpretation by the cardiologist is attached.
FINDINGS: Atherosclerotic calcifications in the thoracic aorta. Within the
visualized portions of the thorax there are no suspicious appearing
pulmonary nodules or masses, there is no acute consolidative
airspace disease, no pleural effusions, no pneumothorax and no
lymphadenopathy. Visualized portions of the upper abdomen are
unremarkable. There are no aggressive appearing lytic or blastic
lesions noted in the visualized portions of the skeleton.
IMPRESSION: 1.  Aortic Atherosclerosis (1Y529-FW6.6).
